# Patient Record
Sex: Male | Born: 1935
Health system: Southern US, Community
[De-identification: ages and names within clinical notes are randomized; demographics above are authoritative.]

## PROBLEM LIST (undated history)

## (undated) DIAGNOSIS — K8591 Acute pancreatitis with uninfected necrosis, unspecified: Secondary | ICD-10-CM

## (undated) DIAGNOSIS — E119 Type 2 diabetes mellitus without complications: Secondary | ICD-10-CM

## (undated) DIAGNOSIS — H9190 Unspecified hearing loss, unspecified ear: Secondary | ICD-10-CM

## (undated) DIAGNOSIS — N189 Chronic kidney disease, unspecified: Secondary | ICD-10-CM

## (undated) DIAGNOSIS — I7090 Unspecified atherosclerosis: Secondary | ICD-10-CM

## (undated) DIAGNOSIS — I110 Hypertensive heart disease with heart failure: Secondary | ICD-10-CM

## (undated) DIAGNOSIS — I5032 Chronic diastolic (congestive) heart failure: Secondary | ICD-10-CM

## (undated) DIAGNOSIS — K861 Other chronic pancreatitis: Secondary | ICD-10-CM

## (undated) DIAGNOSIS — I2510 Atherosclerotic heart disease of native coronary artery without angina pectoris: Secondary | ICD-10-CM

## (undated) DIAGNOSIS — D649 Anemia, unspecified: Secondary | ICD-10-CM

## (undated) DIAGNOSIS — E785 Hyperlipidemia, unspecified: Secondary | ICD-10-CM

## (undated) DIAGNOSIS — I739 Peripheral vascular disease, unspecified: Secondary | ICD-10-CM

## (undated) DIAGNOSIS — K863 Pseudocyst of pancreas: Secondary | ICD-10-CM

## (undated) DIAGNOSIS — H3530 Unspecified macular degeneration: Secondary | ICD-10-CM

## (undated) DIAGNOSIS — K219 Gastro-esophageal reflux disease without esophagitis: Secondary | ICD-10-CM

## (undated) DIAGNOSIS — C61 Malignant neoplasm of prostate: Secondary | ICD-10-CM

## (undated) DIAGNOSIS — I441 Atrioventricular block, second degree: Secondary | ICD-10-CM

## (undated) DIAGNOSIS — I219 Acute myocardial infarction, unspecified: Secondary | ICD-10-CM

## (undated) HISTORY — DX: Other chronic pancreatitis: K86.1

## (undated) HISTORY — DX: Atrioventricular block, second degree: I44.1

## (undated) HISTORY — DX: Hypertensive heart disease with heart failure: I11.0

## (undated) HISTORY — DX: Type 2 diabetes mellitus without complications: E11.9

## (undated) HISTORY — DX: Malignant neoplasm of prostate: C61

## (undated) HISTORY — DX: Anemia, unspecified: D64.9

## (undated) HISTORY — PX: TONSILLECTOMY AND ADENOIDECTOMY: SUR1326

## (undated) HISTORY — DX: Atherosclerotic heart disease of native coronary artery without angina pectoris: I25.10

## (undated) HISTORY — DX: Unspecified macular degeneration: H35.30

## (undated) HISTORY — DX: Hyperlipidemia, unspecified: E78.5

## (undated) HISTORY — PX: TOTAL KNEE REVISION: SHX996

## (undated) HISTORY — DX: Gastro-esophageal reflux disease without esophagitis: K21.9

## (undated) HISTORY — DX: Chronic kidney disease, unspecified: N18.9

## (undated) HISTORY — DX: Acute myocardial infarction, unspecified: I21.9

## (undated) HISTORY — DX: Pseudocyst of pancreas: K86.3

## (undated) HISTORY — DX: Unspecified atherosclerosis: I70.90

## (undated) HISTORY — DX: Unspecified hearing loss, unspecified ear: H91.90

## (undated) HISTORY — DX: Acute pancreatitis with uninfected necrosis, unspecified: K85.91

## (undated) HISTORY — PX: OTHER SURGICAL HISTORY: SHX169

## (undated) HISTORY — PX: CHOLECYSTECTOMY: SHX55

## (undated) HISTORY — DX: Chronic diastolic (congestive) heart failure: I50.32

## (undated) HISTORY — DX: Peripheral vascular disease, unspecified: I73.9

---

## 1999-09-17 ENCOUNTER — Inpatient Hospital Stay (HOSPITAL_COMMUNITY): Admission: RE | Admit: 1999-09-17 | Discharge: 1999-09-19 | Payer: Self-pay | Admitting: Cardiology

## 1999-12-20 ENCOUNTER — Encounter: Payer: Self-pay | Admitting: Emergency Medicine

## 1999-12-20 ENCOUNTER — Inpatient Hospital Stay (HOSPITAL_COMMUNITY): Admission: EM | Admit: 1999-12-20 | Discharge: 1999-12-25 | Payer: Self-pay | Admitting: Emergency Medicine

## 2002-02-18 ENCOUNTER — Encounter: Payer: Self-pay | Admitting: Cardiology

## 2002-02-18 ENCOUNTER — Ambulatory Visit (HOSPITAL_COMMUNITY): Admission: RE | Admit: 2002-02-18 | Discharge: 2002-02-19 | Payer: Self-pay | Admitting: Cardiology

## 2011-08-12 DIAGNOSIS — H353290 Exudative age-related macular degeneration, unspecified eye, stage unspecified: Secondary | ICD-10-CM | POA: Diagnosis not present

## 2011-08-14 DIAGNOSIS — I701 Atherosclerosis of renal artery: Secondary | ICD-10-CM | POA: Diagnosis not present

## 2011-08-14 DIAGNOSIS — I6529 Occlusion and stenosis of unspecified carotid artery: Secondary | ICD-10-CM | POA: Diagnosis not present

## 2011-08-14 DIAGNOSIS — E119 Type 2 diabetes mellitus without complications: Secondary | ICD-10-CM | POA: Diagnosis not present

## 2011-08-14 DIAGNOSIS — I10 Essential (primary) hypertension: Secondary | ICD-10-CM | POA: Diagnosis not present

## 2011-08-14 DIAGNOSIS — I2510 Atherosclerotic heart disease of native coronary artery without angina pectoris: Secondary | ICD-10-CM | POA: Diagnosis not present

## 2011-08-14 DIAGNOSIS — E785 Hyperlipidemia, unspecified: Secondary | ICD-10-CM | POA: Diagnosis not present

## 2011-08-20 DIAGNOSIS — I10 Essential (primary) hypertension: Secondary | ICD-10-CM | POA: Diagnosis not present

## 2011-09-03 DIAGNOSIS — I10 Essential (primary) hypertension: Secondary | ICD-10-CM | POA: Diagnosis not present

## 2011-09-09 DIAGNOSIS — H353290 Exudative age-related macular degeneration, unspecified eye, stage unspecified: Secondary | ICD-10-CM | POA: Diagnosis not present

## 2011-10-13 DIAGNOSIS — K5730 Diverticulosis of large intestine without perforation or abscess without bleeding: Secondary | ICD-10-CM | POA: Diagnosis not present

## 2011-11-04 DIAGNOSIS — H353290 Exudative age-related macular degeneration, unspecified eye, stage unspecified: Secondary | ICD-10-CM | POA: Diagnosis not present

## 2011-11-07 DIAGNOSIS — K219 Gastro-esophageal reflux disease without esophagitis: Secondary | ICD-10-CM | POA: Diagnosis not present

## 2011-11-07 DIAGNOSIS — K208 Other esophagitis without bleeding: Secondary | ICD-10-CM | POA: Diagnosis not present

## 2011-11-07 DIAGNOSIS — E119 Type 2 diabetes mellitus without complications: Secondary | ICD-10-CM | POA: Diagnosis not present

## 2011-11-07 DIAGNOSIS — K449 Diaphragmatic hernia without obstruction or gangrene: Secondary | ICD-10-CM | POA: Diagnosis not present

## 2011-11-07 DIAGNOSIS — Z8719 Personal history of other diseases of the digestive system: Secondary | ICD-10-CM | POA: Diagnosis not present

## 2011-11-07 DIAGNOSIS — K2960 Other gastritis without bleeding: Secondary | ICD-10-CM | POA: Diagnosis not present

## 2011-11-07 DIAGNOSIS — Z1211 Encounter for screening for malignant neoplasm of colon: Secondary | ICD-10-CM | POA: Diagnosis not present

## 2011-11-07 DIAGNOSIS — D126 Benign neoplasm of colon, unspecified: Secondary | ICD-10-CM | POA: Diagnosis not present

## 2011-11-07 DIAGNOSIS — Z86010 Personal history of colonic polyps: Secondary | ICD-10-CM | POA: Diagnosis not present

## 2011-11-07 DIAGNOSIS — E7800 Pure hypercholesterolemia, unspecified: Secondary | ICD-10-CM | POA: Diagnosis not present

## 2011-11-07 DIAGNOSIS — K5730 Diverticulosis of large intestine without perforation or abscess without bleeding: Secondary | ICD-10-CM | POA: Diagnosis not present

## 2011-12-25 DIAGNOSIS — Z79899 Other long term (current) drug therapy: Secondary | ICD-10-CM | POA: Diagnosis not present

## 2011-12-25 DIAGNOSIS — E119 Type 2 diabetes mellitus without complications: Secondary | ICD-10-CM | POA: Diagnosis not present

## 2012-01-06 DIAGNOSIS — H353290 Exudative age-related macular degeneration, unspecified eye, stage unspecified: Secondary | ICD-10-CM | POA: Diagnosis not present

## 2012-01-09 DIAGNOSIS — Z79899 Other long term (current) drug therapy: Secondary | ICD-10-CM | POA: Diagnosis not present

## 2012-01-09 DIAGNOSIS — Z125 Encounter for screening for malignant neoplasm of prostate: Secondary | ICD-10-CM | POA: Diagnosis not present

## 2012-01-14 DIAGNOSIS — M999 Biomechanical lesion, unspecified: Secondary | ICD-10-CM | POA: Diagnosis not present

## 2012-01-14 DIAGNOSIS — M5137 Other intervertebral disc degeneration, lumbosacral region: Secondary | ICD-10-CM | POA: Diagnosis not present

## 2012-01-14 DIAGNOSIS — IMO0002 Reserved for concepts with insufficient information to code with codable children: Secondary | ICD-10-CM | POA: Diagnosis not present

## 2012-01-14 DIAGNOSIS — M62838 Other muscle spasm: Secondary | ICD-10-CM | POA: Diagnosis not present

## 2012-01-15 DIAGNOSIS — M5137 Other intervertebral disc degeneration, lumbosacral region: Secondary | ICD-10-CM | POA: Diagnosis not present

## 2012-01-15 DIAGNOSIS — M999 Biomechanical lesion, unspecified: Secondary | ICD-10-CM | POA: Diagnosis not present

## 2012-01-15 DIAGNOSIS — IMO0002 Reserved for concepts with insufficient information to code with codable children: Secondary | ICD-10-CM | POA: Diagnosis not present

## 2012-01-15 DIAGNOSIS — M62838 Other muscle spasm: Secondary | ICD-10-CM | POA: Diagnosis not present

## 2012-01-16 DIAGNOSIS — M5137 Other intervertebral disc degeneration, lumbosacral region: Secondary | ICD-10-CM | POA: Diagnosis not present

## 2012-01-16 DIAGNOSIS — M999 Biomechanical lesion, unspecified: Secondary | ICD-10-CM | POA: Diagnosis not present

## 2012-01-16 DIAGNOSIS — IMO0002 Reserved for concepts with insufficient information to code with codable children: Secondary | ICD-10-CM | POA: Diagnosis not present

## 2012-01-16 DIAGNOSIS — M62838 Other muscle spasm: Secondary | ICD-10-CM | POA: Diagnosis not present

## 2012-01-19 DIAGNOSIS — IMO0002 Reserved for concepts with insufficient information to code with codable children: Secondary | ICD-10-CM | POA: Diagnosis not present

## 2012-01-19 DIAGNOSIS — M5137 Other intervertebral disc degeneration, lumbosacral region: Secondary | ICD-10-CM | POA: Diagnosis not present

## 2012-01-19 DIAGNOSIS — M999 Biomechanical lesion, unspecified: Secondary | ICD-10-CM | POA: Diagnosis not present

## 2012-01-19 DIAGNOSIS — M62838 Other muscle spasm: Secondary | ICD-10-CM | POA: Diagnosis not present

## 2012-01-28 DIAGNOSIS — M5137 Other intervertebral disc degeneration, lumbosacral region: Secondary | ICD-10-CM | POA: Diagnosis not present

## 2012-01-28 DIAGNOSIS — IMO0002 Reserved for concepts with insufficient information to code with codable children: Secondary | ICD-10-CM | POA: Diagnosis not present

## 2012-01-28 DIAGNOSIS — M999 Biomechanical lesion, unspecified: Secondary | ICD-10-CM | POA: Diagnosis not present

## 2012-01-28 DIAGNOSIS — M62838 Other muscle spasm: Secondary | ICD-10-CM | POA: Diagnosis not present

## 2012-01-30 DIAGNOSIS — M5137 Other intervertebral disc degeneration, lumbosacral region: Secondary | ICD-10-CM | POA: Diagnosis not present

## 2012-01-30 DIAGNOSIS — IMO0002 Reserved for concepts with insufficient information to code with codable children: Secondary | ICD-10-CM | POA: Diagnosis not present

## 2012-01-30 DIAGNOSIS — M999 Biomechanical lesion, unspecified: Secondary | ICD-10-CM | POA: Diagnosis not present

## 2012-01-30 DIAGNOSIS — M62838 Other muscle spasm: Secondary | ICD-10-CM | POA: Diagnosis not present

## 2012-02-02 DIAGNOSIS — IMO0002 Reserved for concepts with insufficient information to code with codable children: Secondary | ICD-10-CM | POA: Diagnosis not present

## 2012-02-02 DIAGNOSIS — M5137 Other intervertebral disc degeneration, lumbosacral region: Secondary | ICD-10-CM | POA: Diagnosis not present

## 2012-02-02 DIAGNOSIS — M999 Biomechanical lesion, unspecified: Secondary | ICD-10-CM | POA: Diagnosis not present

## 2012-02-02 DIAGNOSIS — M62838 Other muscle spasm: Secondary | ICD-10-CM | POA: Diagnosis not present

## 2012-02-03 DIAGNOSIS — IMO0002 Reserved for concepts with insufficient information to code with codable children: Secondary | ICD-10-CM | POA: Diagnosis not present

## 2012-02-03 DIAGNOSIS — M999 Biomechanical lesion, unspecified: Secondary | ICD-10-CM | POA: Diagnosis not present

## 2012-02-03 DIAGNOSIS — M5137 Other intervertebral disc degeneration, lumbosacral region: Secondary | ICD-10-CM | POA: Diagnosis not present

## 2012-02-03 DIAGNOSIS — M62838 Other muscle spasm: Secondary | ICD-10-CM | POA: Diagnosis not present

## 2012-02-05 DIAGNOSIS — IMO0002 Reserved for concepts with insufficient information to code with codable children: Secondary | ICD-10-CM | POA: Diagnosis not present

## 2012-02-05 DIAGNOSIS — M62838 Other muscle spasm: Secondary | ICD-10-CM | POA: Diagnosis not present

## 2012-02-05 DIAGNOSIS — M5137 Other intervertebral disc degeneration, lumbosacral region: Secondary | ICD-10-CM | POA: Diagnosis not present

## 2012-02-05 DIAGNOSIS — M999 Biomechanical lesion, unspecified: Secondary | ICD-10-CM | POA: Diagnosis not present

## 2012-02-09 DIAGNOSIS — M5137 Other intervertebral disc degeneration, lumbosacral region: Secondary | ICD-10-CM | POA: Diagnosis not present

## 2012-02-09 DIAGNOSIS — M62838 Other muscle spasm: Secondary | ICD-10-CM | POA: Diagnosis not present

## 2012-02-09 DIAGNOSIS — IMO0002 Reserved for concepts with insufficient information to code with codable children: Secondary | ICD-10-CM | POA: Diagnosis not present

## 2012-02-09 DIAGNOSIS — M999 Biomechanical lesion, unspecified: Secondary | ICD-10-CM | POA: Diagnosis not present

## 2012-02-10 DIAGNOSIS — M999 Biomechanical lesion, unspecified: Secondary | ICD-10-CM | POA: Diagnosis not present

## 2012-02-10 DIAGNOSIS — IMO0002 Reserved for concepts with insufficient information to code with codable children: Secondary | ICD-10-CM | POA: Diagnosis not present

## 2012-02-10 DIAGNOSIS — M5137 Other intervertebral disc degeneration, lumbosacral region: Secondary | ICD-10-CM | POA: Diagnosis not present

## 2012-02-10 DIAGNOSIS — M62838 Other muscle spasm: Secondary | ICD-10-CM | POA: Diagnosis not present

## 2012-02-12 DIAGNOSIS — M999 Biomechanical lesion, unspecified: Secondary | ICD-10-CM | POA: Diagnosis not present

## 2012-02-12 DIAGNOSIS — M5137 Other intervertebral disc degeneration, lumbosacral region: Secondary | ICD-10-CM | POA: Diagnosis not present

## 2012-02-12 DIAGNOSIS — IMO0002 Reserved for concepts with insufficient information to code with codable children: Secondary | ICD-10-CM | POA: Diagnosis not present

## 2012-02-12 DIAGNOSIS — M62838 Other muscle spasm: Secondary | ICD-10-CM | POA: Diagnosis not present

## 2012-02-16 DIAGNOSIS — IMO0002 Reserved for concepts with insufficient information to code with codable children: Secondary | ICD-10-CM | POA: Diagnosis not present

## 2012-02-16 DIAGNOSIS — M999 Biomechanical lesion, unspecified: Secondary | ICD-10-CM | POA: Diagnosis not present

## 2012-02-16 DIAGNOSIS — M5137 Other intervertebral disc degeneration, lumbosacral region: Secondary | ICD-10-CM | POA: Diagnosis not present

## 2012-02-16 DIAGNOSIS — M62838 Other muscle spasm: Secondary | ICD-10-CM | POA: Diagnosis not present

## 2012-02-17 DIAGNOSIS — M5137 Other intervertebral disc degeneration, lumbosacral region: Secondary | ICD-10-CM | POA: Diagnosis not present

## 2012-02-17 DIAGNOSIS — M999 Biomechanical lesion, unspecified: Secondary | ICD-10-CM | POA: Diagnosis not present

## 2012-02-17 DIAGNOSIS — M62838 Other muscle spasm: Secondary | ICD-10-CM | POA: Diagnosis not present

## 2012-02-17 DIAGNOSIS — IMO0002 Reserved for concepts with insufficient information to code with codable children: Secondary | ICD-10-CM | POA: Diagnosis not present

## 2012-03-09 DIAGNOSIS — H353290 Exudative age-related macular degeneration, unspecified eye, stage unspecified: Secondary | ICD-10-CM | POA: Diagnosis not present

## 2012-03-26 DIAGNOSIS — H3530 Unspecified macular degeneration: Secondary | ICD-10-CM | POA: Diagnosis not present

## 2012-03-26 DIAGNOSIS — H2510 Age-related nuclear cataract, unspecified eye: Secondary | ICD-10-CM | POA: Diagnosis not present

## 2012-03-26 DIAGNOSIS — E119 Type 2 diabetes mellitus without complications: Secondary | ICD-10-CM | POA: Diagnosis not present

## 2012-04-09 DIAGNOSIS — H3530 Unspecified macular degeneration: Secondary | ICD-10-CM | POA: Diagnosis not present

## 2012-05-17 DIAGNOSIS — Z23 Encounter for immunization: Secondary | ICD-10-CM | POA: Diagnosis not present

## 2012-08-23 DIAGNOSIS — E785 Hyperlipidemia, unspecified: Secondary | ICD-10-CM | POA: Diagnosis not present

## 2012-08-23 DIAGNOSIS — E119 Type 2 diabetes mellitus without complications: Secondary | ICD-10-CM | POA: Diagnosis not present

## 2012-08-23 DIAGNOSIS — I2510 Atherosclerotic heart disease of native coronary artery without angina pectoris: Secondary | ICD-10-CM | POA: Diagnosis not present

## 2012-08-23 DIAGNOSIS — I209 Angina pectoris, unspecified: Secondary | ICD-10-CM | POA: Diagnosis not present

## 2012-08-23 DIAGNOSIS — I10 Essential (primary) hypertension: Secondary | ICD-10-CM | POA: Diagnosis not present

## 2012-09-23 DIAGNOSIS — M899 Disorder of bone, unspecified: Secondary | ICD-10-CM | POA: Diagnosis not present

## 2012-11-01 DIAGNOSIS — H3530 Unspecified macular degeneration: Secondary | ICD-10-CM | POA: Diagnosis not present

## 2012-12-28 DIAGNOSIS — H3530 Unspecified macular degeneration: Secondary | ICD-10-CM | POA: Diagnosis not present

## 2012-12-28 DIAGNOSIS — K219 Gastro-esophageal reflux disease without esophagitis: Secondary | ICD-10-CM | POA: Diagnosis not present

## 2012-12-28 DIAGNOSIS — E785 Hyperlipidemia, unspecified: Secondary | ICD-10-CM | POA: Diagnosis not present

## 2012-12-28 DIAGNOSIS — R0602 Shortness of breath: Secondary | ICD-10-CM | POA: Diagnosis not present

## 2012-12-28 DIAGNOSIS — N400 Enlarged prostate without lower urinary tract symptoms: Secondary | ICD-10-CM | POA: Diagnosis not present

## 2012-12-28 DIAGNOSIS — R52 Pain, unspecified: Secondary | ICD-10-CM | POA: Diagnosis not present

## 2012-12-28 DIAGNOSIS — Z794 Long term (current) use of insulin: Secondary | ICD-10-CM | POA: Diagnosis not present

## 2012-12-28 DIAGNOSIS — Z96659 Presence of unspecified artificial knee joint: Secondary | ICD-10-CM | POA: Diagnosis not present

## 2012-12-28 DIAGNOSIS — Z7902 Long term (current) use of antithrombotics/antiplatelets: Secondary | ICD-10-CM | POA: Diagnosis not present

## 2012-12-28 DIAGNOSIS — Z9861 Coronary angioplasty status: Secondary | ICD-10-CM | POA: Diagnosis not present

## 2012-12-28 DIAGNOSIS — Z7982 Long term (current) use of aspirin: Secondary | ICD-10-CM | POA: Diagnosis not present

## 2012-12-28 DIAGNOSIS — I200 Unstable angina: Secondary | ICD-10-CM | POA: Diagnosis not present

## 2012-12-28 DIAGNOSIS — Z9889 Other specified postprocedural states: Secondary | ICD-10-CM | POA: Diagnosis not present

## 2012-12-28 DIAGNOSIS — R079 Chest pain, unspecified: Secondary | ICD-10-CM | POA: Diagnosis not present

## 2012-12-28 DIAGNOSIS — E7800 Pure hypercholesterolemia, unspecified: Secondary | ICD-10-CM | POA: Diagnosis not present

## 2012-12-28 DIAGNOSIS — I10 Essential (primary) hypertension: Secondary | ICD-10-CM | POA: Diagnosis not present

## 2012-12-28 DIAGNOSIS — E119 Type 2 diabetes mellitus without complications: Secondary | ICD-10-CM | POA: Diagnosis not present

## 2012-12-28 DIAGNOSIS — Z87891 Personal history of nicotine dependence: Secondary | ICD-10-CM | POA: Diagnosis not present

## 2012-12-28 DIAGNOSIS — J4489 Other specified chronic obstructive pulmonary disease: Secondary | ICD-10-CM | POA: Diagnosis not present

## 2012-12-28 DIAGNOSIS — R0789 Other chest pain: Secondary | ICD-10-CM | POA: Diagnosis not present

## 2012-12-28 DIAGNOSIS — Z79899 Other long term (current) drug therapy: Secondary | ICD-10-CM | POA: Diagnosis not present

## 2012-12-28 DIAGNOSIS — J449 Chronic obstructive pulmonary disease, unspecified: Secondary | ICD-10-CM | POA: Diagnosis not present

## 2012-12-28 DIAGNOSIS — I2510 Atherosclerotic heart disease of native coronary artery without angina pectoris: Secondary | ICD-10-CM | POA: Diagnosis not present

## 2012-12-29 DIAGNOSIS — R0789 Other chest pain: Secondary | ICD-10-CM | POA: Diagnosis not present

## 2012-12-29 DIAGNOSIS — E1169 Type 2 diabetes mellitus with other specified complication: Secondary | ICD-10-CM | POA: Diagnosis not present

## 2012-12-29 DIAGNOSIS — K219 Gastro-esophageal reflux disease without esophagitis: Secondary | ICD-10-CM | POA: Diagnosis not present

## 2012-12-29 DIAGNOSIS — I2510 Atherosclerotic heart disease of native coronary artery without angina pectoris: Secondary | ICD-10-CM | POA: Diagnosis not present

## 2012-12-29 DIAGNOSIS — R079 Chest pain, unspecified: Secondary | ICD-10-CM | POA: Diagnosis not present

## 2013-01-05 DIAGNOSIS — R1013 Epigastric pain: Secondary | ICD-10-CM | POA: Diagnosis not present

## 2013-01-05 DIAGNOSIS — Z006 Encounter for examination for normal comparison and control in clinical research program: Secondary | ICD-10-CM | POA: Diagnosis not present

## 2013-01-05 DIAGNOSIS — E782 Mixed hyperlipidemia: Secondary | ICD-10-CM | POA: Diagnosis not present

## 2013-01-05 DIAGNOSIS — E119 Type 2 diabetes mellitus without complications: Secondary | ICD-10-CM | POA: Diagnosis not present

## 2013-04-15 DIAGNOSIS — Z23 Encounter for immunization: Secondary | ICD-10-CM | POA: Diagnosis not present

## 2013-05-24 DIAGNOSIS — H353290 Exudative age-related macular degeneration, unspecified eye, stage unspecified: Secondary | ICD-10-CM | POA: Diagnosis not present

## 2013-07-07 DIAGNOSIS — M25579 Pain in unspecified ankle and joints of unspecified foot: Secondary | ICD-10-CM | POA: Diagnosis not present

## 2013-08-18 DIAGNOSIS — I359 Nonrheumatic aortic valve disorder, unspecified: Secondary | ICD-10-CM | POA: Diagnosis not present

## 2013-08-18 DIAGNOSIS — E785 Hyperlipidemia, unspecified: Secondary | ICD-10-CM | POA: Diagnosis not present

## 2013-08-18 DIAGNOSIS — I2510 Atherosclerotic heart disease of native coronary artery without angina pectoris: Secondary | ICD-10-CM | POA: Diagnosis not present

## 2013-08-18 DIAGNOSIS — I10 Essential (primary) hypertension: Secondary | ICD-10-CM | POA: Diagnosis not present

## 2013-11-07 DIAGNOSIS — H3530 Unspecified macular degeneration: Secondary | ICD-10-CM | POA: Diagnosis not present

## 2013-12-20 DIAGNOSIS — H353290 Exudative age-related macular degeneration, unspecified eye, stage unspecified: Secondary | ICD-10-CM | POA: Diagnosis not present

## 2013-12-22 DIAGNOSIS — E119 Type 2 diabetes mellitus without complications: Secondary | ICD-10-CM | POA: Diagnosis not present

## 2013-12-22 DIAGNOSIS — E782 Mixed hyperlipidemia: Secondary | ICD-10-CM | POA: Diagnosis not present

## 2013-12-22 DIAGNOSIS — Z125 Encounter for screening for malignant neoplasm of prostate: Secondary | ICD-10-CM | POA: Diagnosis not present

## 2013-12-28 DIAGNOSIS — N429 Disorder of prostate, unspecified: Secondary | ICD-10-CM | POA: Diagnosis not present

## 2013-12-28 DIAGNOSIS — IMO0001 Reserved for inherently not codable concepts without codable children: Secondary | ICD-10-CM | POA: Diagnosis not present

## 2013-12-28 DIAGNOSIS — Z0000 Encounter for general adult medical examination without abnormal findings: Secondary | ICD-10-CM | POA: Diagnosis not present

## 2014-01-18 DIAGNOSIS — R972 Elevated prostate specific antigen [PSA]: Secondary | ICD-10-CM | POA: Diagnosis not present

## 2014-01-18 DIAGNOSIS — N403 Nodular prostate with lower urinary tract symptoms: Secondary | ICD-10-CM | POA: Diagnosis not present

## 2014-01-18 DIAGNOSIS — N138 Other obstructive and reflux uropathy: Secondary | ICD-10-CM | POA: Diagnosis not present

## 2014-03-03 DIAGNOSIS — IMO0002 Reserved for concepts with insufficient information to code with codable children: Secondary | ICD-10-CM | POA: Diagnosis not present

## 2014-03-03 DIAGNOSIS — C61 Malignant neoplasm of prostate: Secondary | ICD-10-CM | POA: Diagnosis not present

## 2014-03-03 DIAGNOSIS — D075 Carcinoma in situ of prostate: Secondary | ICD-10-CM | POA: Diagnosis not present

## 2014-03-03 DIAGNOSIS — R972 Elevated prostate specific antigen [PSA]: Secondary | ICD-10-CM | POA: Diagnosis not present

## 2014-03-07 ENCOUNTER — Other Ambulatory Visit: Payer: Self-pay | Admitting: Urology

## 2014-03-07 DIAGNOSIS — C61 Malignant neoplasm of prostate: Secondary | ICD-10-CM

## 2014-03-20 ENCOUNTER — Encounter (HOSPITAL_COMMUNITY)
Admission: RE | Admit: 2014-03-20 | Discharge: 2014-03-20 | Disposition: A | Discharge status: Home / Self Care | Payer: Medicare Other | Source: Ambulatory Visit | Attending: Urology | Admitting: Urology

## 2014-03-20 ENCOUNTER — Encounter (HOSPITAL_COMMUNITY): Payer: Medicare Other

## 2014-03-20 DIAGNOSIS — K5730 Diverticulosis of large intestine without perforation or abscess without bleeding: Secondary | ICD-10-CM | POA: Diagnosis not present

## 2014-03-20 DIAGNOSIS — C61 Malignant neoplasm of prostate: Secondary | ICD-10-CM | POA: Insufficient documentation

## 2014-03-20 MED ORDER — TECHNETIUM TC 99M MEDRONATE IV KIT
27.5000 | PACK | Freq: Once | INTRAVENOUS | Status: AC | PRN
  Administered 2014-03-20: 27.5 via INTRAVENOUS

## 2014-04-07 ENCOUNTER — Ambulatory Visit (HOSPITAL_COMMUNITY): Payer: Self-pay

## 2014-04-07 ENCOUNTER — Encounter (HOSPITAL_COMMUNITY): Payer: Self-pay

## 2014-04-11 DIAGNOSIS — Z23 Encounter for immunization: Secondary | ICD-10-CM | POA: Diagnosis not present

## 2014-04-12 DIAGNOSIS — C61 Malignant neoplasm of prostate: Secondary | ICD-10-CM | POA: Diagnosis not present

## 2014-04-12 DIAGNOSIS — N138 Other obstructive and reflux uropathy: Secondary | ICD-10-CM | POA: Diagnosis not present

## 2014-04-12 DIAGNOSIS — N403 Nodular prostate with lower urinary tract symptoms: Secondary | ICD-10-CM | POA: Diagnosis not present

## 2014-04-20 DIAGNOSIS — I2510 Atherosclerotic heart disease of native coronary artery without angina pectoris: Secondary | ICD-10-CM | POA: Diagnosis not present

## 2014-04-20 DIAGNOSIS — Z794 Long term (current) use of insulin: Secondary | ICD-10-CM | POA: Diagnosis not present

## 2014-04-20 DIAGNOSIS — C61 Malignant neoplasm of prostate: Secondary | ICD-10-CM | POA: Diagnosis not present

## 2014-04-20 DIAGNOSIS — Z79899 Other long term (current) drug therapy: Secondary | ICD-10-CM | POA: Diagnosis not present

## 2014-04-20 DIAGNOSIS — K219 Gastro-esophageal reflux disease without esophagitis: Secondary | ICD-10-CM | POA: Diagnosis not present

## 2014-04-20 DIAGNOSIS — E119 Type 2 diabetes mellitus without complications: Secondary | ICD-10-CM | POA: Diagnosis not present

## 2014-04-20 DIAGNOSIS — E785 Hyperlipidemia, unspecified: Secondary | ICD-10-CM | POA: Diagnosis not present

## 2014-05-10 DIAGNOSIS — R1084 Generalized abdominal pain: Secondary | ICD-10-CM | POA: Diagnosis not present

## 2014-05-10 DIAGNOSIS — R1011 Right upper quadrant pain: Secondary | ICD-10-CM | POA: Diagnosis not present

## 2014-05-15 DIAGNOSIS — C61 Malignant neoplasm of prostate: Secondary | ICD-10-CM | POA: Diagnosis not present

## 2014-05-15 DIAGNOSIS — R1011 Right upper quadrant pain: Secondary | ICD-10-CM | POA: Diagnosis not present

## 2014-05-22 DIAGNOSIS — Z510 Encounter for antineoplastic radiation therapy: Secondary | ICD-10-CM | POA: Diagnosis not present

## 2014-05-22 DIAGNOSIS — C61 Malignant neoplasm of prostate: Secondary | ICD-10-CM | POA: Diagnosis not present

## 2014-05-30 DIAGNOSIS — C61 Malignant neoplasm of prostate: Secondary | ICD-10-CM | POA: Diagnosis not present

## 2014-05-30 DIAGNOSIS — Z510 Encounter for antineoplastic radiation therapy: Secondary | ICD-10-CM | POA: Diagnosis not present

## 2014-05-31 DIAGNOSIS — C61 Malignant neoplasm of prostate: Secondary | ICD-10-CM | POA: Diagnosis not present

## 2014-05-31 DIAGNOSIS — Z510 Encounter for antineoplastic radiation therapy: Secondary | ICD-10-CM | POA: Diagnosis not present

## 2014-06-05 DIAGNOSIS — Z510 Encounter for antineoplastic radiation therapy: Secondary | ICD-10-CM | POA: Diagnosis not present

## 2014-06-05 DIAGNOSIS — C61 Malignant neoplasm of prostate: Secondary | ICD-10-CM | POA: Diagnosis not present

## 2014-06-06 DIAGNOSIS — C61 Malignant neoplasm of prostate: Secondary | ICD-10-CM | POA: Diagnosis not present

## 2014-06-06 DIAGNOSIS — Z510 Encounter for antineoplastic radiation therapy: Secondary | ICD-10-CM | POA: Diagnosis not present

## 2014-06-07 DIAGNOSIS — C61 Malignant neoplasm of prostate: Secondary | ICD-10-CM | POA: Diagnosis not present

## 2014-06-07 DIAGNOSIS — Z510 Encounter for antineoplastic radiation therapy: Secondary | ICD-10-CM | POA: Diagnosis not present

## 2014-06-08 DIAGNOSIS — Z510 Encounter for antineoplastic radiation therapy: Secondary | ICD-10-CM | POA: Diagnosis not present

## 2014-06-08 DIAGNOSIS — C61 Malignant neoplasm of prostate: Secondary | ICD-10-CM | POA: Diagnosis not present

## 2014-06-09 DIAGNOSIS — C61 Malignant neoplasm of prostate: Secondary | ICD-10-CM | POA: Diagnosis not present

## 2014-06-09 DIAGNOSIS — Z510 Encounter for antineoplastic radiation therapy: Secondary | ICD-10-CM | POA: Diagnosis not present

## 2014-06-12 DIAGNOSIS — C61 Malignant neoplasm of prostate: Secondary | ICD-10-CM | POA: Diagnosis not present

## 2014-06-12 DIAGNOSIS — Z510 Encounter for antineoplastic radiation therapy: Secondary | ICD-10-CM | POA: Diagnosis not present

## 2014-06-13 DIAGNOSIS — C61 Malignant neoplasm of prostate: Secondary | ICD-10-CM | POA: Diagnosis not present

## 2014-06-13 DIAGNOSIS — Z510 Encounter for antineoplastic radiation therapy: Secondary | ICD-10-CM | POA: Diagnosis not present

## 2014-06-14 DIAGNOSIS — C61 Malignant neoplasm of prostate: Secondary | ICD-10-CM | POA: Diagnosis not present

## 2014-06-14 DIAGNOSIS — Z510 Encounter for antineoplastic radiation therapy: Secondary | ICD-10-CM | POA: Diagnosis not present

## 2014-06-15 DIAGNOSIS — Z510 Encounter for antineoplastic radiation therapy: Secondary | ICD-10-CM | POA: Diagnosis not present

## 2014-06-15 DIAGNOSIS — C61 Malignant neoplasm of prostate: Secondary | ICD-10-CM | POA: Diagnosis not present

## 2014-06-16 DIAGNOSIS — C61 Malignant neoplasm of prostate: Secondary | ICD-10-CM | POA: Diagnosis not present

## 2014-06-16 DIAGNOSIS — Z510 Encounter for antineoplastic radiation therapy: Secondary | ICD-10-CM | POA: Diagnosis not present

## 2014-06-17 DIAGNOSIS — C61 Malignant neoplasm of prostate: Secondary | ICD-10-CM | POA: Diagnosis not present

## 2014-06-17 DIAGNOSIS — Z510 Encounter for antineoplastic radiation therapy: Secondary | ICD-10-CM | POA: Diagnosis not present

## 2014-06-19 DIAGNOSIS — C61 Malignant neoplasm of prostate: Secondary | ICD-10-CM | POA: Diagnosis not present

## 2014-06-19 DIAGNOSIS — Z510 Encounter for antineoplastic radiation therapy: Secondary | ICD-10-CM | POA: Diagnosis not present

## 2014-06-20 DIAGNOSIS — C61 Malignant neoplasm of prostate: Secondary | ICD-10-CM | POA: Diagnosis not present

## 2014-06-20 DIAGNOSIS — Z510 Encounter for antineoplastic radiation therapy: Secondary | ICD-10-CM | POA: Diagnosis not present

## 2014-06-21 DIAGNOSIS — E119 Type 2 diabetes mellitus without complications: Secondary | ICD-10-CM | POA: Diagnosis not present

## 2014-06-21 DIAGNOSIS — C61 Malignant neoplasm of prostate: Secondary | ICD-10-CM | POA: Diagnosis not present

## 2014-06-21 DIAGNOSIS — Z510 Encounter for antineoplastic radiation therapy: Secondary | ICD-10-CM | POA: Diagnosis not present

## 2014-06-26 DIAGNOSIS — Z510 Encounter for antineoplastic radiation therapy: Secondary | ICD-10-CM | POA: Diagnosis not present

## 2014-06-26 DIAGNOSIS — C61 Malignant neoplasm of prostate: Secondary | ICD-10-CM | POA: Diagnosis not present

## 2014-06-27 DIAGNOSIS — Z510 Encounter for antineoplastic radiation therapy: Secondary | ICD-10-CM | POA: Diagnosis not present

## 2014-06-27 DIAGNOSIS — C61 Malignant neoplasm of prostate: Secondary | ICD-10-CM | POA: Diagnosis not present

## 2014-06-28 DIAGNOSIS — C61 Malignant neoplasm of prostate: Secondary | ICD-10-CM | POA: Diagnosis not present

## 2014-06-29 DIAGNOSIS — C61 Malignant neoplasm of prostate: Secondary | ICD-10-CM | POA: Diagnosis not present

## 2014-06-30 DIAGNOSIS — C61 Malignant neoplasm of prostate: Secondary | ICD-10-CM | POA: Diagnosis not present

## 2014-07-03 DIAGNOSIS — C61 Malignant neoplasm of prostate: Secondary | ICD-10-CM | POA: Diagnosis not present

## 2014-07-03 DIAGNOSIS — K861 Other chronic pancreatitis: Secondary | ICD-10-CM | POA: Diagnosis not present

## 2014-07-03 DIAGNOSIS — E1165 Type 2 diabetes mellitus with hyperglycemia: Secondary | ICD-10-CM | POA: Diagnosis not present

## 2014-07-03 DIAGNOSIS — E1149 Type 2 diabetes mellitus with other diabetic neurological complication: Secondary | ICD-10-CM | POA: Diagnosis not present

## 2014-07-04 DIAGNOSIS — C61 Malignant neoplasm of prostate: Secondary | ICD-10-CM | POA: Diagnosis not present

## 2014-07-05 DIAGNOSIS — C61 Malignant neoplasm of prostate: Secondary | ICD-10-CM | POA: Diagnosis not present

## 2014-07-06 DIAGNOSIS — C61 Malignant neoplasm of prostate: Secondary | ICD-10-CM | POA: Diagnosis not present

## 2014-07-07 DIAGNOSIS — C61 Malignant neoplasm of prostate: Secondary | ICD-10-CM | POA: Diagnosis not present

## 2014-07-10 DIAGNOSIS — C61 Malignant neoplasm of prostate: Secondary | ICD-10-CM | POA: Diagnosis not present

## 2014-07-11 DIAGNOSIS — C61 Malignant neoplasm of prostate: Secondary | ICD-10-CM | POA: Diagnosis not present

## 2014-07-12 DIAGNOSIS — C61 Malignant neoplasm of prostate: Secondary | ICD-10-CM | POA: Diagnosis not present

## 2014-07-13 DIAGNOSIS — C61 Malignant neoplasm of prostate: Secondary | ICD-10-CM | POA: Diagnosis not present

## 2014-07-14 DIAGNOSIS — C61 Malignant neoplasm of prostate: Secondary | ICD-10-CM | POA: Diagnosis not present

## 2014-07-18 DIAGNOSIS — C61 Malignant neoplasm of prostate: Secondary | ICD-10-CM | POA: Diagnosis not present

## 2014-07-19 DIAGNOSIS — C61 Malignant neoplasm of prostate: Secondary | ICD-10-CM | POA: Diagnosis not present

## 2014-07-20 DIAGNOSIS — C61 Malignant neoplasm of prostate: Secondary | ICD-10-CM | POA: Diagnosis not present

## 2014-07-24 DIAGNOSIS — C61 Malignant neoplasm of prostate: Secondary | ICD-10-CM | POA: Diagnosis not present

## 2014-07-25 DIAGNOSIS — E785 Hyperlipidemia, unspecified: Secondary | ICD-10-CM | POA: Diagnosis not present

## 2014-07-25 DIAGNOSIS — I10 Essential (primary) hypertension: Secondary | ICD-10-CM | POA: Diagnosis not present

## 2014-07-25 DIAGNOSIS — C61 Malignant neoplasm of prostate: Secondary | ICD-10-CM | POA: Diagnosis not present

## 2014-07-25 DIAGNOSIS — I2510 Atherosclerotic heart disease of native coronary artery without angina pectoris: Secondary | ICD-10-CM | POA: Diagnosis not present

## 2014-07-25 DIAGNOSIS — E119 Type 2 diabetes mellitus without complications: Secondary | ICD-10-CM | POA: Diagnosis not present

## 2014-07-26 DIAGNOSIS — C61 Malignant neoplasm of prostate: Secondary | ICD-10-CM | POA: Diagnosis not present

## 2014-07-27 DIAGNOSIS — C61 Malignant neoplasm of prostate: Secondary | ICD-10-CM | POA: Diagnosis not present

## 2014-07-31 DIAGNOSIS — Z510 Encounter for antineoplastic radiation therapy: Secondary | ICD-10-CM | POA: Diagnosis not present

## 2014-07-31 DIAGNOSIS — C61 Malignant neoplasm of prostate: Secondary | ICD-10-CM | POA: Diagnosis not present

## 2014-08-01 DIAGNOSIS — C61 Malignant neoplasm of prostate: Secondary | ICD-10-CM | POA: Diagnosis not present

## 2014-08-01 DIAGNOSIS — Z510 Encounter for antineoplastic radiation therapy: Secondary | ICD-10-CM | POA: Diagnosis not present

## 2014-08-02 DIAGNOSIS — C61 Malignant neoplasm of prostate: Secondary | ICD-10-CM | POA: Diagnosis not present

## 2014-08-02 DIAGNOSIS — Z510 Encounter for antineoplastic radiation therapy: Secondary | ICD-10-CM | POA: Diagnosis not present

## 2014-08-03 DIAGNOSIS — Z510 Encounter for antineoplastic radiation therapy: Secondary | ICD-10-CM | POA: Diagnosis not present

## 2014-08-03 DIAGNOSIS — C61 Malignant neoplasm of prostate: Secondary | ICD-10-CM | POA: Diagnosis not present

## 2014-08-30 DIAGNOSIS — C61 Malignant neoplasm of prostate: Secondary | ICD-10-CM | POA: Diagnosis not present

## 2014-09-12 DIAGNOSIS — H3530 Unspecified macular degeneration: Secondary | ICD-10-CM | POA: Diagnosis not present

## 2014-09-20 DIAGNOSIS — C61 Malignant neoplasm of prostate: Secondary | ICD-10-CM | POA: Diagnosis not present

## 2014-09-28 DIAGNOSIS — E1149 Type 2 diabetes mellitus with other diabetic neurological complication: Secondary | ICD-10-CM | POA: Diagnosis not present

## 2014-10-02 DIAGNOSIS — E1149 Type 2 diabetes mellitus with other diabetic neurological complication: Secondary | ICD-10-CM | POA: Diagnosis not present

## 2014-10-02 DIAGNOSIS — K861 Other chronic pancreatitis: Secondary | ICD-10-CM | POA: Diagnosis not present

## 2014-10-02 DIAGNOSIS — C61 Malignant neoplasm of prostate: Secondary | ICD-10-CM | POA: Diagnosis not present

## 2014-10-02 DIAGNOSIS — E1165 Type 2 diabetes mellitus with hyperglycemia: Secondary | ICD-10-CM | POA: Diagnosis not present

## 2014-10-24 DIAGNOSIS — E291 Testicular hypofunction: Secondary | ICD-10-CM | POA: Diagnosis not present

## 2014-11-01 DIAGNOSIS — C61 Malignant neoplasm of prostate: Secondary | ICD-10-CM | POA: Diagnosis not present

## 2014-12-27 DIAGNOSIS — E119 Type 2 diabetes mellitus without complications: Secondary | ICD-10-CM | POA: Diagnosis not present

## 2015-01-01 DIAGNOSIS — E1165 Type 2 diabetes mellitus with hyperglycemia: Secondary | ICD-10-CM | POA: Diagnosis not present

## 2015-01-01 DIAGNOSIS — Z0000 Encounter for general adult medical examination without abnormal findings: Secondary | ICD-10-CM | POA: Diagnosis not present

## 2015-01-01 DIAGNOSIS — E1149 Type 2 diabetes mellitus with other diabetic neurological complication: Secondary | ICD-10-CM | POA: Diagnosis not present

## 2015-01-02 DIAGNOSIS — E780 Pure hypercholesterolemia: Secondary | ICD-10-CM | POA: Diagnosis not present

## 2015-01-02 DIAGNOSIS — E119 Type 2 diabetes mellitus without complications: Secondary | ICD-10-CM | POA: Diagnosis not present

## 2015-01-02 DIAGNOSIS — M545 Low back pain: Secondary | ICD-10-CM | POA: Diagnosis not present

## 2015-01-02 DIAGNOSIS — I10 Essential (primary) hypertension: Secondary | ICD-10-CM | POA: Diagnosis not present

## 2015-02-01 DIAGNOSIS — I2510 Atherosclerotic heart disease of native coronary artery without angina pectoris: Secondary | ICD-10-CM | POA: Diagnosis not present

## 2015-02-01 DIAGNOSIS — E119 Type 2 diabetes mellitus without complications: Secondary | ICD-10-CM | POA: Diagnosis not present

## 2015-02-01 DIAGNOSIS — M545 Low back pain: Secondary | ICD-10-CM | POA: Diagnosis not present

## 2015-02-01 DIAGNOSIS — E780 Pure hypercholesterolemia: Secondary | ICD-10-CM | POA: Diagnosis not present

## 2015-02-14 DIAGNOSIS — C61 Malignant neoplasm of prostate: Secondary | ICD-10-CM | POA: Diagnosis not present

## 2015-02-14 DIAGNOSIS — Z125 Encounter for screening for malignant neoplasm of prostate: Secondary | ICD-10-CM | POA: Diagnosis not present

## 2015-02-23 DIAGNOSIS — C61 Malignant neoplasm of prostate: Secondary | ICD-10-CM | POA: Diagnosis not present

## 2015-03-04 DIAGNOSIS — E119 Type 2 diabetes mellitus without complications: Secondary | ICD-10-CM | POA: Diagnosis not present

## 2015-03-04 DIAGNOSIS — M5440 Lumbago with sciatica, unspecified side: Secondary | ICD-10-CM | POA: Diagnosis not present

## 2015-03-04 DIAGNOSIS — E782 Mixed hyperlipidemia: Secondary | ICD-10-CM | POA: Diagnosis not present

## 2015-03-04 DIAGNOSIS — I2510 Atherosclerotic heart disease of native coronary artery without angina pectoris: Secondary | ICD-10-CM | POA: Diagnosis not present

## 2015-03-15 DIAGNOSIS — H3530 Unspecified macular degeneration: Secondary | ICD-10-CM | POA: Diagnosis not present

## 2015-04-04 DIAGNOSIS — R972 Elevated prostate specific antigen [PSA]: Secondary | ICD-10-CM | POA: Diagnosis not present

## 2015-04-04 DIAGNOSIS — Z79899 Other long term (current) drug therapy: Secondary | ICD-10-CM | POA: Diagnosis not present

## 2015-04-04 DIAGNOSIS — E119 Type 2 diabetes mellitus without complications: Secondary | ICD-10-CM | POA: Diagnosis not present

## 2015-04-04 DIAGNOSIS — E782 Mixed hyperlipidemia: Secondary | ICD-10-CM | POA: Diagnosis not present

## 2015-04-11 DIAGNOSIS — Z23 Encounter for immunization: Secondary | ICD-10-CM | POA: Diagnosis not present

## 2015-04-11 DIAGNOSIS — E119 Type 2 diabetes mellitus without complications: Secondary | ICD-10-CM | POA: Diagnosis not present

## 2015-04-11 DIAGNOSIS — K868 Other specified diseases of pancreas: Secondary | ICD-10-CM | POA: Diagnosis not present

## 2015-04-16 DIAGNOSIS — H3531 Nonexudative age-related macular degeneration: Secondary | ICD-10-CM | POA: Diagnosis not present

## 2015-04-16 DIAGNOSIS — H35033 Hypertensive retinopathy, bilateral: Secondary | ICD-10-CM | POA: Diagnosis not present

## 2015-04-23 DIAGNOSIS — H2512 Age-related nuclear cataract, left eye: Secondary | ICD-10-CM | POA: Diagnosis not present

## 2015-04-23 DIAGNOSIS — H3532 Exudative age-related macular degeneration: Secondary | ICD-10-CM | POA: Diagnosis not present

## 2015-04-23 DIAGNOSIS — H3531 Nonexudative age-related macular degeneration: Secondary | ICD-10-CM | POA: Diagnosis not present

## 2015-04-25 DIAGNOSIS — R531 Weakness: Secondary | ICD-10-CM | POA: Diagnosis not present

## 2015-04-25 DIAGNOSIS — R509 Fever, unspecified: Secondary | ICD-10-CM | POA: Diagnosis not present

## 2015-04-25 DIAGNOSIS — K219 Gastro-esophageal reflux disease without esophagitis: Secondary | ICD-10-CM | POA: Diagnosis present

## 2015-04-25 DIAGNOSIS — Z79899 Other long term (current) drug therapy: Secondary | ICD-10-CM | POA: Diagnosis not present

## 2015-04-25 DIAGNOSIS — I5033 Acute on chronic diastolic (congestive) heart failure: Secondary | ICD-10-CM | POA: Diagnosis present

## 2015-04-25 DIAGNOSIS — K863 Pseudocyst of pancreas: Secondary | ICD-10-CM | POA: Diagnosis present

## 2015-04-25 DIAGNOSIS — E782 Mixed hyperlipidemia: Secondary | ICD-10-CM | POA: Diagnosis present

## 2015-04-25 DIAGNOSIS — I110 Hypertensive heart disease with heart failure: Secondary | ICD-10-CM | POA: Diagnosis not present

## 2015-04-25 DIAGNOSIS — J9811 Atelectasis: Secondary | ICD-10-CM | POA: Diagnosis not present

## 2015-04-25 DIAGNOSIS — R109 Unspecified abdominal pain: Secondary | ICD-10-CM | POA: Diagnosis not present

## 2015-04-25 DIAGNOSIS — E139 Other specified diabetes mellitus without complications: Secondary | ICD-10-CM | POA: Diagnosis not present

## 2015-04-25 DIAGNOSIS — R748 Abnormal levels of other serum enzymes: Secondary | ICD-10-CM | POA: Diagnosis present

## 2015-04-25 DIAGNOSIS — K8531 Drug induced acute pancreatitis with uninfected necrosis: Secondary | ICD-10-CM | POA: Diagnosis not present

## 2015-04-25 DIAGNOSIS — J449 Chronic obstructive pulmonary disease, unspecified: Secondary | ICD-10-CM | POA: Diagnosis present

## 2015-04-25 DIAGNOSIS — D638 Anemia in other chronic diseases classified elsewhere: Secondary | ICD-10-CM | POA: Diagnosis not present

## 2015-04-25 DIAGNOSIS — M1990 Unspecified osteoarthritis, unspecified site: Secondary | ICD-10-CM | POA: Diagnosis present

## 2015-04-25 DIAGNOSIS — I38 Endocarditis, valve unspecified: Secondary | ICD-10-CM | POA: Diagnosis not present

## 2015-04-25 DIAGNOSIS — Z8546 Personal history of malignant neoplasm of prostate: Secondary | ICD-10-CM | POA: Diagnosis not present

## 2015-04-25 DIAGNOSIS — K859 Acute pancreatitis without necrosis or infection, unspecified: Secondary | ICD-10-CM | POA: Diagnosis not present

## 2015-04-25 DIAGNOSIS — I509 Heart failure, unspecified: Secondary | ICD-10-CM | POA: Diagnosis not present

## 2015-04-25 DIAGNOSIS — Z4682 Encounter for fitting and adjustment of non-vascular catheter: Secondary | ICD-10-CM | POA: Diagnosis not present

## 2015-04-25 DIAGNOSIS — Z794 Long term (current) use of insulin: Secondary | ICD-10-CM | POA: Diagnosis not present

## 2015-04-25 DIAGNOSIS — R0989 Other specified symptoms and signs involving the circulatory and respiratory systems: Secondary | ICD-10-CM | POA: Diagnosis not present

## 2015-04-25 DIAGNOSIS — K567 Ileus, unspecified: Secondary | ICD-10-CM | POA: Diagnosis not present

## 2015-04-25 DIAGNOSIS — I517 Cardiomegaly: Secondary | ICD-10-CM | POA: Diagnosis not present

## 2015-04-25 DIAGNOSIS — D649 Anemia, unspecified: Secondary | ICD-10-CM | POA: Diagnosis not present

## 2015-04-25 DIAGNOSIS — E7800 Pure hypercholesterolemia, unspecified: Secondary | ICD-10-CM | POA: Diagnosis present

## 2015-04-25 DIAGNOSIS — Z9861 Coronary angioplasty status: Secondary | ICD-10-CM | POA: Diagnosis not present

## 2015-04-25 DIAGNOSIS — I340 Nonrheumatic mitral (valve) insufficiency: Secondary | ICD-10-CM | POA: Diagnosis not present

## 2015-04-25 DIAGNOSIS — K8591 Acute pancreatitis with uninfected necrosis, unspecified: Secondary | ICD-10-CM | POA: Diagnosis not present

## 2015-04-25 DIAGNOSIS — I060 Rheumatic aortic stenosis: Secondary | ICD-10-CM | POA: Diagnosis present

## 2015-04-25 DIAGNOSIS — R9431 Abnormal electrocardiogram [ECG] [EKG]: Secondary | ICD-10-CM | POA: Diagnosis not present

## 2015-04-25 DIAGNOSIS — K2980 Duodenitis without bleeding: Secondary | ICD-10-CM | POA: Diagnosis present

## 2015-04-25 DIAGNOSIS — Z87891 Personal history of nicotine dependence: Secondary | ICD-10-CM | POA: Diagnosis not present

## 2015-04-25 DIAGNOSIS — R140 Abdominal distension (gaseous): Secondary | ICD-10-CM | POA: Diagnosis not present

## 2015-04-25 DIAGNOSIS — J690 Pneumonitis due to inhalation of food and vomit: Secondary | ICD-10-CM | POA: Diagnosis not present

## 2015-04-25 DIAGNOSIS — J80 Acute respiratory distress syndrome: Secondary | ICD-10-CM | POA: Diagnosis not present

## 2015-04-25 DIAGNOSIS — R1013 Epigastric pain: Secondary | ICD-10-CM | POA: Diagnosis not present

## 2015-04-25 DIAGNOSIS — I252 Old myocardial infarction: Secondary | ICD-10-CM | POA: Diagnosis not present

## 2015-04-25 DIAGNOSIS — I2510 Atherosclerotic heart disease of native coronary artery without angina pectoris: Secondary | ICD-10-CM | POA: Diagnosis not present

## 2015-05-16 DIAGNOSIS — K2980 Duodenitis without bleeding: Secondary | ICD-10-CM | POA: Diagnosis not present

## 2015-05-16 DIAGNOSIS — Z87891 Personal history of nicotine dependence: Secondary | ICD-10-CM | POA: Diagnosis not present

## 2015-05-16 DIAGNOSIS — Z794 Long term (current) use of insulin: Secondary | ICD-10-CM | POA: Diagnosis not present

## 2015-05-16 DIAGNOSIS — E139 Other specified diabetes mellitus without complications: Secondary | ICD-10-CM | POA: Diagnosis not present

## 2015-05-16 DIAGNOSIS — I5030 Unspecified diastolic (congestive) heart failure: Secondary | ICD-10-CM | POA: Diagnosis not present

## 2015-05-16 DIAGNOSIS — K863 Pseudocyst of pancreas: Secondary | ICD-10-CM | POA: Diagnosis not present

## 2015-05-16 DIAGNOSIS — I110 Hypertensive heart disease with heart failure: Secondary | ICD-10-CM | POA: Diagnosis not present

## 2015-05-16 DIAGNOSIS — D638 Anemia in other chronic diseases classified elsewhere: Secondary | ICD-10-CM | POA: Diagnosis not present

## 2015-05-16 DIAGNOSIS — J449 Chronic obstructive pulmonary disease, unspecified: Secondary | ICD-10-CM | POA: Diagnosis not present

## 2015-05-16 DIAGNOSIS — I350 Nonrheumatic aortic (valve) stenosis: Secondary | ICD-10-CM | POA: Diagnosis not present

## 2015-05-16 DIAGNOSIS — Z792 Long term (current) use of antibiotics: Secondary | ICD-10-CM | POA: Diagnosis not present

## 2015-05-17 DIAGNOSIS — E139 Other specified diabetes mellitus without complications: Secondary | ICD-10-CM | POA: Diagnosis not present

## 2015-05-17 DIAGNOSIS — K863 Pseudocyst of pancreas: Secondary | ICD-10-CM | POA: Diagnosis not present

## 2015-05-17 DIAGNOSIS — I110 Hypertensive heart disease with heart failure: Secondary | ICD-10-CM | POA: Diagnosis not present

## 2015-05-17 DIAGNOSIS — I350 Nonrheumatic aortic (valve) stenosis: Secondary | ICD-10-CM | POA: Diagnosis not present

## 2015-05-17 DIAGNOSIS — K2980 Duodenitis without bleeding: Secondary | ICD-10-CM | POA: Diagnosis not present

## 2015-05-17 DIAGNOSIS — I5030 Unspecified diastolic (congestive) heart failure: Secondary | ICD-10-CM | POA: Diagnosis not present

## 2015-05-21 DIAGNOSIS — K859 Acute pancreatitis without necrosis or infection, unspecified: Secondary | ICD-10-CM | POA: Diagnosis not present

## 2015-05-21 DIAGNOSIS — K863 Pseudocyst of pancreas: Secondary | ICD-10-CM | POA: Diagnosis not present

## 2015-05-21 DIAGNOSIS — I5030 Unspecified diastolic (congestive) heart failure: Secondary | ICD-10-CM | POA: Diagnosis not present

## 2015-05-21 DIAGNOSIS — E139 Other specified diabetes mellitus without complications: Secondary | ICD-10-CM | POA: Diagnosis not present

## 2015-05-21 DIAGNOSIS — I110 Hypertensive heart disease with heart failure: Secondary | ICD-10-CM | POA: Diagnosis not present

## 2015-05-21 DIAGNOSIS — I350 Nonrheumatic aortic (valve) stenosis: Secondary | ICD-10-CM | POA: Diagnosis not present

## 2015-05-21 DIAGNOSIS — K2980 Duodenitis without bleeding: Secondary | ICD-10-CM | POA: Diagnosis not present

## 2015-05-22 DIAGNOSIS — I5030 Unspecified diastolic (congestive) heart failure: Secondary | ICD-10-CM | POA: Diagnosis not present

## 2015-05-22 DIAGNOSIS — K863 Pseudocyst of pancreas: Secondary | ICD-10-CM | POA: Diagnosis not present

## 2015-05-22 DIAGNOSIS — E139 Other specified diabetes mellitus without complications: Secondary | ICD-10-CM | POA: Diagnosis not present

## 2015-05-22 DIAGNOSIS — I350 Nonrheumatic aortic (valve) stenosis: Secondary | ICD-10-CM | POA: Diagnosis not present

## 2015-05-22 DIAGNOSIS — K2980 Duodenitis without bleeding: Secondary | ICD-10-CM | POA: Diagnosis not present

## 2015-05-22 DIAGNOSIS — I110 Hypertensive heart disease with heart failure: Secondary | ICD-10-CM | POA: Diagnosis not present

## 2015-05-23 DIAGNOSIS — I350 Nonrheumatic aortic (valve) stenosis: Secondary | ICD-10-CM | POA: Diagnosis not present

## 2015-05-23 DIAGNOSIS — I110 Hypertensive heart disease with heart failure: Secondary | ICD-10-CM | POA: Diagnosis not present

## 2015-05-23 DIAGNOSIS — K863 Pseudocyst of pancreas: Secondary | ICD-10-CM | POA: Diagnosis not present

## 2015-05-23 DIAGNOSIS — K8591 Acute pancreatitis with uninfected necrosis, unspecified: Secondary | ICD-10-CM | POA: Diagnosis not present

## 2015-05-23 DIAGNOSIS — I5030 Unspecified diastolic (congestive) heart failure: Secondary | ICD-10-CM | POA: Diagnosis not present

## 2015-05-23 DIAGNOSIS — K2980 Duodenitis without bleeding: Secondary | ICD-10-CM | POA: Diagnosis not present

## 2015-05-23 DIAGNOSIS — D649 Anemia, unspecified: Secondary | ICD-10-CM | POA: Diagnosis not present

## 2015-05-23 DIAGNOSIS — E139 Other specified diabetes mellitus without complications: Secondary | ICD-10-CM | POA: Diagnosis not present

## 2015-05-24 DIAGNOSIS — I350 Nonrheumatic aortic (valve) stenosis: Secondary | ICD-10-CM | POA: Diagnosis not present

## 2015-05-24 DIAGNOSIS — I5030 Unspecified diastolic (congestive) heart failure: Secondary | ICD-10-CM | POA: Diagnosis not present

## 2015-05-24 DIAGNOSIS — E139 Other specified diabetes mellitus without complications: Secondary | ICD-10-CM | POA: Diagnosis not present

## 2015-05-24 DIAGNOSIS — K2980 Duodenitis without bleeding: Secondary | ICD-10-CM | POA: Diagnosis not present

## 2015-05-24 DIAGNOSIS — I110 Hypertensive heart disease with heart failure: Secondary | ICD-10-CM | POA: Diagnosis not present

## 2015-05-24 DIAGNOSIS — K863 Pseudocyst of pancreas: Secondary | ICD-10-CM | POA: Diagnosis not present

## 2015-05-25 DIAGNOSIS — I110 Hypertensive heart disease with heart failure: Secondary | ICD-10-CM | POA: Diagnosis not present

## 2015-05-25 DIAGNOSIS — I350 Nonrheumatic aortic (valve) stenosis: Secondary | ICD-10-CM | POA: Diagnosis not present

## 2015-05-25 DIAGNOSIS — E139 Other specified diabetes mellitus without complications: Secondary | ICD-10-CM | POA: Diagnosis not present

## 2015-05-25 DIAGNOSIS — K2980 Duodenitis without bleeding: Secondary | ICD-10-CM | POA: Diagnosis not present

## 2015-05-25 DIAGNOSIS — I5030 Unspecified diastolic (congestive) heart failure: Secondary | ICD-10-CM | POA: Diagnosis not present

## 2015-05-25 DIAGNOSIS — K863 Pseudocyst of pancreas: Secondary | ICD-10-CM | POA: Diagnosis not present

## 2015-05-28 DIAGNOSIS — K2980 Duodenitis without bleeding: Secondary | ICD-10-CM | POA: Diagnosis not present

## 2015-05-28 DIAGNOSIS — E139 Other specified diabetes mellitus without complications: Secondary | ICD-10-CM | POA: Diagnosis not present

## 2015-05-28 DIAGNOSIS — K863 Pseudocyst of pancreas: Secondary | ICD-10-CM | POA: Diagnosis not present

## 2015-05-28 DIAGNOSIS — I350 Nonrheumatic aortic (valve) stenosis: Secondary | ICD-10-CM | POA: Diagnosis not present

## 2015-05-28 DIAGNOSIS — I110 Hypertensive heart disease with heart failure: Secondary | ICD-10-CM | POA: Diagnosis not present

## 2015-05-28 DIAGNOSIS — I5030 Unspecified diastolic (congestive) heart failure: Secondary | ICD-10-CM | POA: Diagnosis not present

## 2015-05-29 DIAGNOSIS — E139 Other specified diabetes mellitus without complications: Secondary | ICD-10-CM | POA: Diagnosis not present

## 2015-05-29 DIAGNOSIS — K2980 Duodenitis without bleeding: Secondary | ICD-10-CM | POA: Diagnosis not present

## 2015-05-29 DIAGNOSIS — I350 Nonrheumatic aortic (valve) stenosis: Secondary | ICD-10-CM | POA: Diagnosis not present

## 2015-05-29 DIAGNOSIS — I110 Hypertensive heart disease with heart failure: Secondary | ICD-10-CM | POA: Diagnosis not present

## 2015-05-29 DIAGNOSIS — K859 Acute pancreatitis without necrosis or infection, unspecified: Secondary | ICD-10-CM | POA: Diagnosis not present

## 2015-05-29 DIAGNOSIS — K863 Pseudocyst of pancreas: Secondary | ICD-10-CM | POA: Diagnosis not present

## 2015-05-29 DIAGNOSIS — I5030 Unspecified diastolic (congestive) heart failure: Secondary | ICD-10-CM | POA: Diagnosis not present

## 2015-05-30 DIAGNOSIS — E139 Other specified diabetes mellitus without complications: Secondary | ICD-10-CM | POA: Diagnosis not present

## 2015-05-30 DIAGNOSIS — I5030 Unspecified diastolic (congestive) heart failure: Secondary | ICD-10-CM | POA: Diagnosis not present

## 2015-05-30 DIAGNOSIS — I350 Nonrheumatic aortic (valve) stenosis: Secondary | ICD-10-CM | POA: Diagnosis not present

## 2015-05-30 DIAGNOSIS — K863 Pseudocyst of pancreas: Secondary | ICD-10-CM | POA: Diagnosis not present

## 2015-05-30 DIAGNOSIS — I110 Hypertensive heart disease with heart failure: Secondary | ICD-10-CM | POA: Diagnosis not present

## 2015-05-30 DIAGNOSIS — K2980 Duodenitis without bleeding: Secondary | ICD-10-CM | POA: Diagnosis not present

## 2015-05-31 DIAGNOSIS — E139 Other specified diabetes mellitus without complications: Secondary | ICD-10-CM | POA: Diagnosis not present

## 2015-05-31 DIAGNOSIS — I110 Hypertensive heart disease with heart failure: Secondary | ICD-10-CM | POA: Diagnosis not present

## 2015-05-31 DIAGNOSIS — I5030 Unspecified diastolic (congestive) heart failure: Secondary | ICD-10-CM | POA: Diagnosis not present

## 2015-05-31 DIAGNOSIS — K2980 Duodenitis without bleeding: Secondary | ICD-10-CM | POA: Diagnosis not present

## 2015-05-31 DIAGNOSIS — I350 Nonrheumatic aortic (valve) stenosis: Secondary | ICD-10-CM | POA: Diagnosis not present

## 2015-05-31 DIAGNOSIS — K863 Pseudocyst of pancreas: Secondary | ICD-10-CM | POA: Diagnosis not present

## 2015-06-01 DIAGNOSIS — K863 Pseudocyst of pancreas: Secondary | ICD-10-CM | POA: Diagnosis not present

## 2015-06-01 DIAGNOSIS — E139 Other specified diabetes mellitus without complications: Secondary | ICD-10-CM | POA: Diagnosis not present

## 2015-06-01 DIAGNOSIS — I350 Nonrheumatic aortic (valve) stenosis: Secondary | ICD-10-CM | POA: Diagnosis not present

## 2015-06-01 DIAGNOSIS — I5030 Unspecified diastolic (congestive) heart failure: Secondary | ICD-10-CM | POA: Diagnosis not present

## 2015-06-01 DIAGNOSIS — I110 Hypertensive heart disease with heart failure: Secondary | ICD-10-CM | POA: Diagnosis not present

## 2015-06-01 DIAGNOSIS — K2980 Duodenitis without bleeding: Secondary | ICD-10-CM | POA: Diagnosis not present

## 2015-06-06 DIAGNOSIS — I110 Hypertensive heart disease with heart failure: Secondary | ICD-10-CM | POA: Diagnosis not present

## 2015-06-06 DIAGNOSIS — E139 Other specified diabetes mellitus without complications: Secondary | ICD-10-CM | POA: Diagnosis not present

## 2015-06-06 DIAGNOSIS — K863 Pseudocyst of pancreas: Secondary | ICD-10-CM | POA: Diagnosis not present

## 2015-06-06 DIAGNOSIS — I350 Nonrheumatic aortic (valve) stenosis: Secondary | ICD-10-CM | POA: Diagnosis not present

## 2015-06-06 DIAGNOSIS — I5030 Unspecified diastolic (congestive) heart failure: Secondary | ICD-10-CM | POA: Diagnosis not present

## 2015-06-06 DIAGNOSIS — K2980 Duodenitis without bleeding: Secondary | ICD-10-CM | POA: Diagnosis not present

## 2015-06-08 DIAGNOSIS — K863 Pseudocyst of pancreas: Secondary | ICD-10-CM | POA: Diagnosis not present

## 2015-06-08 DIAGNOSIS — I5030 Unspecified diastolic (congestive) heart failure: Secondary | ICD-10-CM | POA: Diagnosis not present

## 2015-06-08 DIAGNOSIS — I350 Nonrheumatic aortic (valve) stenosis: Secondary | ICD-10-CM | POA: Diagnosis not present

## 2015-06-08 DIAGNOSIS — K2980 Duodenitis without bleeding: Secondary | ICD-10-CM | POA: Diagnosis not present

## 2015-06-08 DIAGNOSIS — I110 Hypertensive heart disease with heart failure: Secondary | ICD-10-CM | POA: Diagnosis not present

## 2015-06-08 DIAGNOSIS — E139 Other specified diabetes mellitus without complications: Secondary | ICD-10-CM | POA: Diagnosis not present

## 2015-06-11 DIAGNOSIS — K859 Acute pancreatitis without necrosis or infection, unspecified: Secondary | ICD-10-CM | POA: Diagnosis not present

## 2015-06-11 DIAGNOSIS — K5730 Diverticulosis of large intestine without perforation or abscess without bleeding: Secondary | ICD-10-CM | POA: Diagnosis not present

## 2015-06-18 DIAGNOSIS — K8689 Other specified diseases of pancreas: Secondary | ICD-10-CM | POA: Diagnosis not present

## 2015-06-18 DIAGNOSIS — D649 Anemia, unspecified: Secondary | ICD-10-CM | POA: Diagnosis not present

## 2015-06-18 DIAGNOSIS — Z79899 Other long term (current) drug therapy: Secondary | ICD-10-CM | POA: Diagnosis not present

## 2015-06-18 DIAGNOSIS — D539 Nutritional anemia, unspecified: Secondary | ICD-10-CM | POA: Diagnosis not present

## 2015-06-26 DIAGNOSIS — Z9049 Acquired absence of other specified parts of digestive tract: Secondary | ICD-10-CM | POA: Diagnosis not present

## 2015-06-26 DIAGNOSIS — K863 Pseudocyst of pancreas: Secondary | ICD-10-CM | POA: Diagnosis not present

## 2015-06-26 DIAGNOSIS — R6881 Early satiety: Secondary | ICD-10-CM | POA: Diagnosis not present

## 2015-06-26 DIAGNOSIS — K8591 Acute pancreatitis with uninfected necrosis, unspecified: Secondary | ICD-10-CM | POA: Diagnosis not present

## 2015-06-26 DIAGNOSIS — K5730 Diverticulosis of large intestine without perforation or abscess without bleeding: Secondary | ICD-10-CM | POA: Diagnosis not present

## 2015-06-26 DIAGNOSIS — K8689 Other specified diseases of pancreas: Secondary | ICD-10-CM | POA: Diagnosis not present

## 2015-06-26 DIAGNOSIS — K449 Diaphragmatic hernia without obstruction or gangrene: Secondary | ICD-10-CM | POA: Diagnosis not present

## 2015-06-26 DIAGNOSIS — K862 Cyst of pancreas: Secondary | ICD-10-CM | POA: Diagnosis not present

## 2015-06-26 HISTORY — DX: Acute pancreatitis with uninfected necrosis, unspecified: K85.91

## 2015-06-26 HISTORY — DX: Pseudocyst of pancreas: K86.3

## 2015-07-05 DIAGNOSIS — I252 Old myocardial infarction: Secondary | ICD-10-CM | POA: Diagnosis not present

## 2015-07-05 DIAGNOSIS — E119 Type 2 diabetes mellitus without complications: Secondary | ICD-10-CM | POA: Diagnosis not present

## 2015-07-05 DIAGNOSIS — K862 Cyst of pancreas: Secondary | ICD-10-CM | POA: Diagnosis not present

## 2015-07-05 DIAGNOSIS — I10 Essential (primary) hypertension: Secondary | ICD-10-CM | POA: Diagnosis not present

## 2015-07-05 DIAGNOSIS — Z8546 Personal history of malignant neoplasm of prostate: Secondary | ICD-10-CM | POA: Diagnosis not present

## 2015-07-05 DIAGNOSIS — R6881 Early satiety: Secondary | ICD-10-CM | POA: Diagnosis not present

## 2015-07-05 DIAGNOSIS — K863 Pseudocyst of pancreas: Secondary | ICD-10-CM | POA: Diagnosis not present

## 2015-07-05 DIAGNOSIS — K219 Gastro-esophageal reflux disease without esophagitis: Secondary | ICD-10-CM | POA: Diagnosis not present

## 2015-07-05 DIAGNOSIS — K8592 Acute pancreatitis with infected necrosis, unspecified: Secondary | ICD-10-CM | POA: Diagnosis not present

## 2015-07-05 DIAGNOSIS — I2510 Atherosclerotic heart disease of native coronary artery without angina pectoris: Secondary | ICD-10-CM | POA: Diagnosis not present

## 2015-07-05 DIAGNOSIS — D649 Anemia, unspecified: Secondary | ICD-10-CM | POA: Diagnosis not present

## 2015-07-05 DIAGNOSIS — K8591 Acute pancreatitis with uninfected necrosis, unspecified: Secondary | ICD-10-CM | POA: Diagnosis not present

## 2015-07-05 DIAGNOSIS — Z794 Long term (current) use of insulin: Secondary | ICD-10-CM | POA: Diagnosis not present

## 2015-07-05 DIAGNOSIS — J449 Chronic obstructive pulmonary disease, unspecified: Secondary | ICD-10-CM | POA: Diagnosis not present

## 2015-07-05 DIAGNOSIS — Q394 Esophageal web: Secondary | ICD-10-CM | POA: Diagnosis not present

## 2015-07-05 DIAGNOSIS — Z9049 Acquired absence of other specified parts of digestive tract: Secondary | ICD-10-CM | POA: Diagnosis not present

## 2015-07-05 DIAGNOSIS — K8689 Other specified diseases of pancreas: Secondary | ICD-10-CM | POA: Diagnosis not present

## 2015-07-05 DIAGNOSIS — Z86010 Personal history of colonic polyps: Secondary | ICD-10-CM | POA: Diagnosis not present

## 2015-07-05 DIAGNOSIS — I509 Heart failure, unspecified: Secondary | ICD-10-CM | POA: Diagnosis not present

## 2015-07-05 DIAGNOSIS — R634 Abnormal weight loss: Secondary | ICD-10-CM | POA: Diagnosis not present

## 2015-07-05 DIAGNOSIS — Z87891 Personal history of nicotine dependence: Secondary | ICD-10-CM | POA: Diagnosis not present

## 2015-07-05 DIAGNOSIS — R011 Cardiac murmur, unspecified: Secondary | ICD-10-CM | POA: Diagnosis not present

## 2015-07-05 DIAGNOSIS — R110 Nausea: Secondary | ICD-10-CM | POA: Diagnosis not present

## 2015-07-05 DIAGNOSIS — K861 Other chronic pancreatitis: Secondary | ICD-10-CM | POA: Diagnosis not present

## 2015-07-05 DIAGNOSIS — Z79899 Other long term (current) drug therapy: Secondary | ICD-10-CM | POA: Diagnosis not present

## 2015-07-05 DIAGNOSIS — Z923 Personal history of irradiation: Secondary | ICD-10-CM | POA: Diagnosis not present

## 2015-07-06 DIAGNOSIS — Q394 Esophageal web: Secondary | ICD-10-CM | POA: Diagnosis not present

## 2015-07-06 DIAGNOSIS — I2510 Atherosclerotic heart disease of native coronary artery without angina pectoris: Secondary | ICD-10-CM | POA: Diagnosis not present

## 2015-07-06 DIAGNOSIS — R109 Unspecified abdominal pain: Secondary | ICD-10-CM | POA: Diagnosis not present

## 2015-07-06 DIAGNOSIS — E119 Type 2 diabetes mellitus without complications: Secondary | ICD-10-CM | POA: Diagnosis not present

## 2015-07-06 DIAGNOSIS — K8591 Acute pancreatitis with uninfected necrosis, unspecified: Secondary | ICD-10-CM | POA: Diagnosis not present

## 2015-07-06 DIAGNOSIS — K861 Other chronic pancreatitis: Secondary | ICD-10-CM | POA: Diagnosis not present

## 2015-07-06 DIAGNOSIS — I440 Atrioventricular block, first degree: Secondary | ICD-10-CM | POA: Diagnosis not present

## 2015-07-06 DIAGNOSIS — I10 Essential (primary) hypertension: Secondary | ICD-10-CM | POA: Diagnosis not present

## 2015-07-06 DIAGNOSIS — K863 Pseudocyst of pancreas: Secondary | ICD-10-CM | POA: Diagnosis not present

## 2015-07-12 DIAGNOSIS — K861 Other chronic pancreatitis: Secondary | ICD-10-CM | POA: Diagnosis not present

## 2015-07-12 DIAGNOSIS — K8689 Other specified diseases of pancreas: Secondary | ICD-10-CM | POA: Diagnosis not present

## 2015-08-01 DIAGNOSIS — K863 Pseudocyst of pancreas: Secondary | ICD-10-CM | POA: Diagnosis not present

## 2015-08-01 DIAGNOSIS — K8591 Acute pancreatitis with uninfected necrosis, unspecified: Secondary | ICD-10-CM | POA: Diagnosis not present

## 2015-08-08 DIAGNOSIS — D539 Nutritional anemia, unspecified: Secondary | ICD-10-CM | POA: Diagnosis not present

## 2015-08-10 DIAGNOSIS — K861 Other chronic pancreatitis: Secondary | ICD-10-CM | POA: Diagnosis not present

## 2015-08-10 DIAGNOSIS — K219 Gastro-esophageal reflux disease without esophagitis: Secondary | ICD-10-CM | POA: Diagnosis not present

## 2015-08-10 DIAGNOSIS — K8591 Acute pancreatitis with uninfected necrosis, unspecified: Secondary | ICD-10-CM | POA: Diagnosis not present

## 2015-08-10 DIAGNOSIS — Z4589 Encounter for adjustment and management of other implanted devices: Secondary | ICD-10-CM | POA: Diagnosis not present

## 2015-08-10 DIAGNOSIS — K8689 Other specified diseases of pancreas: Secondary | ICD-10-CM | POA: Diagnosis not present

## 2015-08-10 DIAGNOSIS — I10 Essential (primary) hypertension: Secondary | ICD-10-CM | POA: Diagnosis not present

## 2015-08-10 DIAGNOSIS — Z87891 Personal history of nicotine dependence: Secondary | ICD-10-CM | POA: Diagnosis not present

## 2015-08-10 DIAGNOSIS — Z7984 Long term (current) use of oral hypoglycemic drugs: Secondary | ICD-10-CM | POA: Diagnosis not present

## 2015-08-10 DIAGNOSIS — E119 Type 2 diabetes mellitus without complications: Secondary | ICD-10-CM | POA: Diagnosis not present

## 2015-08-10 DIAGNOSIS — J449 Chronic obstructive pulmonary disease, unspecified: Secondary | ICD-10-CM | POA: Diagnosis not present

## 2015-08-10 DIAGNOSIS — I252 Old myocardial infarction: Secondary | ICD-10-CM | POA: Diagnosis not present

## 2015-08-10 DIAGNOSIS — Z86010 Personal history of colonic polyps: Secondary | ICD-10-CM | POA: Diagnosis not present

## 2015-08-10 DIAGNOSIS — Z794 Long term (current) use of insulin: Secondary | ICD-10-CM | POA: Diagnosis not present

## 2015-08-10 DIAGNOSIS — I509 Heart failure, unspecified: Secondary | ICD-10-CM | POA: Diagnosis not present

## 2015-08-10 DIAGNOSIS — H3530 Unspecified macular degeneration: Secondary | ICD-10-CM | POA: Diagnosis not present

## 2015-08-10 DIAGNOSIS — K863 Pseudocyst of pancreas: Secondary | ICD-10-CM | POA: Diagnosis not present

## 2015-08-11 DIAGNOSIS — K861 Other chronic pancreatitis: Secondary | ICD-10-CM | POA: Diagnosis not present

## 2015-08-11 DIAGNOSIS — H3530 Unspecified macular degeneration: Secondary | ICD-10-CM | POA: Diagnosis not present

## 2015-08-11 DIAGNOSIS — I252 Old myocardial infarction: Secondary | ICD-10-CM | POA: Diagnosis not present

## 2015-08-11 DIAGNOSIS — E119 Type 2 diabetes mellitus without complications: Secondary | ICD-10-CM | POA: Diagnosis not present

## 2015-08-11 DIAGNOSIS — K8689 Other specified diseases of pancreas: Secondary | ICD-10-CM | POA: Diagnosis not present

## 2015-08-11 DIAGNOSIS — I509 Heart failure, unspecified: Secondary | ICD-10-CM | POA: Diagnosis not present

## 2015-08-11 DIAGNOSIS — I10 Essential (primary) hypertension: Secondary | ICD-10-CM | POA: Diagnosis not present

## 2015-08-16 DIAGNOSIS — K8689 Other specified diseases of pancreas: Secondary | ICD-10-CM | POA: Diagnosis not present

## 2015-08-24 DIAGNOSIS — K863 Pseudocyst of pancreas: Secondary | ICD-10-CM | POA: Diagnosis not present

## 2015-08-24 DIAGNOSIS — K859 Acute pancreatitis without necrosis or infection, unspecified: Secondary | ICD-10-CM | POA: Diagnosis not present

## 2015-08-24 DIAGNOSIS — K862 Cyst of pancreas: Secondary | ICD-10-CM | POA: Diagnosis not present

## 2015-08-28 DIAGNOSIS — Z9889 Other specified postprocedural states: Secondary | ICD-10-CM | POA: Diagnosis not present

## 2015-08-28 DIAGNOSIS — K863 Pseudocyst of pancreas: Secondary | ICD-10-CM | POA: Diagnosis not present

## 2015-08-28 DIAGNOSIS — K8591 Acute pancreatitis with uninfected necrosis, unspecified: Secondary | ICD-10-CM | POA: Diagnosis not present

## 2015-08-28 DIAGNOSIS — K8689 Other specified diseases of pancreas: Secondary | ICD-10-CM | POA: Diagnosis not present

## 2015-09-18 DIAGNOSIS — H353230 Exudative age-related macular degeneration, bilateral, stage unspecified: Secondary | ICD-10-CM | POA: Diagnosis not present

## 2015-11-21 DIAGNOSIS — Z125 Encounter for screening for malignant neoplasm of prostate: Secondary | ICD-10-CM | POA: Diagnosis not present

## 2015-11-21 DIAGNOSIS — C61 Malignant neoplasm of prostate: Secondary | ICD-10-CM | POA: Diagnosis not present

## 2015-12-04 DIAGNOSIS — H353230 Exudative age-related macular degeneration, bilateral, stage unspecified: Secondary | ICD-10-CM | POA: Diagnosis not present

## 2015-12-04 DIAGNOSIS — H25812 Combined forms of age-related cataract, left eye: Secondary | ICD-10-CM | POA: Diagnosis not present

## 2015-12-04 DIAGNOSIS — E119 Type 2 diabetes mellitus without complications: Secondary | ICD-10-CM | POA: Diagnosis not present

## 2015-12-06 DIAGNOSIS — H353221 Exudative age-related macular degeneration, left eye, with active choroidal neovascularization: Secondary | ICD-10-CM | POA: Diagnosis not present

## 2015-12-09 DIAGNOSIS — E1169 Type 2 diabetes mellitus with other specified complication: Secondary | ICD-10-CM

## 2015-12-09 DIAGNOSIS — I25119 Atherosclerotic heart disease of native coronary artery with unspecified angina pectoris: Secondary | ICD-10-CM

## 2015-12-09 DIAGNOSIS — D649 Anemia, unspecified: Secondary | ICD-10-CM

## 2015-12-09 DIAGNOSIS — K861 Other chronic pancreatitis: Secondary | ICD-10-CM | POA: Insufficient documentation

## 2015-12-09 DIAGNOSIS — H9190 Unspecified hearing loss, unspecified ear: Secondary | ICD-10-CM | POA: Insufficient documentation

## 2015-12-09 DIAGNOSIS — I5020 Unspecified systolic (congestive) heart failure: Secondary | ICD-10-CM | POA: Insufficient documentation

## 2015-12-09 DIAGNOSIS — E785 Hyperlipidemia, unspecified: Secondary | ICD-10-CM

## 2015-12-09 DIAGNOSIS — I5032 Chronic diastolic (congestive) heart failure: Secondary | ICD-10-CM

## 2015-12-09 DIAGNOSIS — I110 Hypertensive heart disease with heart failure: Secondary | ICD-10-CM

## 2015-12-09 DIAGNOSIS — I2510 Atherosclerotic heart disease of native coronary artery without angina pectoris: Secondary | ICD-10-CM

## 2015-12-09 DIAGNOSIS — I25110 Atherosclerotic heart disease of native coronary artery with unstable angina pectoris: Secondary | ICD-10-CM

## 2015-12-09 HISTORY — DX: Hyperlipidemia, unspecified: E78.5

## 2015-12-09 HISTORY — DX: Chronic diastolic (congestive) heart failure: I50.32

## 2015-12-09 HISTORY — DX: Unspecified systolic (congestive) heart failure: I50.20

## 2015-12-09 HISTORY — DX: Atherosclerotic heart disease of native coronary artery without angina pectoris: I25.10

## 2015-12-09 HISTORY — DX: Atherosclerotic heart disease of native coronary artery with unstable angina pectoris: I25.110

## 2015-12-09 HISTORY — DX: Hypertensive heart disease with heart failure: I11.0

## 2015-12-09 HISTORY — DX: Atherosclerotic heart disease of native coronary artery with unspecified angina pectoris: I25.119

## 2015-12-09 HISTORY — DX: Type 2 diabetes mellitus with other specified complication: E11.69

## 2015-12-09 HISTORY — DX: Anemia, unspecified: D64.9

## 2015-12-10 DIAGNOSIS — I2510 Atherosclerotic heart disease of native coronary artery without angina pectoris: Secondary | ICD-10-CM | POA: Diagnosis not present

## 2015-12-10 DIAGNOSIS — I5032 Chronic diastolic (congestive) heart failure: Secondary | ICD-10-CM | POA: Diagnosis not present

## 2015-12-10 DIAGNOSIS — D649 Anemia, unspecified: Secondary | ICD-10-CM | POA: Diagnosis not present

## 2015-12-10 DIAGNOSIS — I110 Hypertensive heart disease with heart failure: Secondary | ICD-10-CM | POA: Diagnosis not present

## 2015-12-10 DIAGNOSIS — E785 Hyperlipidemia, unspecified: Secondary | ICD-10-CM | POA: Diagnosis not present

## 2015-12-12 DIAGNOSIS — N403 Nodular prostate with lower urinary tract symptoms: Secondary | ICD-10-CM | POA: Diagnosis not present

## 2015-12-12 DIAGNOSIS — R350 Frequency of micturition: Secondary | ICD-10-CM | POA: Diagnosis not present

## 2015-12-12 DIAGNOSIS — Z0000 Encounter for general adult medical examination without abnormal findings: Secondary | ICD-10-CM | POA: Diagnosis not present

## 2015-12-12 DIAGNOSIS — C61 Malignant neoplasm of prostate: Secondary | ICD-10-CM | POA: Diagnosis not present

## 2015-12-29 DIAGNOSIS — E860 Dehydration: Secondary | ICD-10-CM | POA: Diagnosis not present

## 2015-12-29 DIAGNOSIS — E119 Type 2 diabetes mellitus without complications: Secondary | ICD-10-CM | POA: Diagnosis not present

## 2015-12-29 DIAGNOSIS — R197 Diarrhea, unspecified: Secondary | ICD-10-CM | POA: Diagnosis not present

## 2015-12-29 DIAGNOSIS — I509 Heart failure, unspecified: Secondary | ICD-10-CM | POA: Diagnosis not present

## 2015-12-29 DIAGNOSIS — Z7984 Long term (current) use of oral hypoglycemic drugs: Secondary | ICD-10-CM | POA: Diagnosis not present

## 2015-12-29 DIAGNOSIS — I252 Old myocardial infarction: Secondary | ICD-10-CM | POA: Diagnosis not present

## 2015-12-29 DIAGNOSIS — J449 Chronic obstructive pulmonary disease, unspecified: Secondary | ICD-10-CM | POA: Diagnosis not present

## 2015-12-29 DIAGNOSIS — Z87891 Personal history of nicotine dependence: Secondary | ICD-10-CM | POA: Diagnosis not present

## 2015-12-29 DIAGNOSIS — I10 Essential (primary) hypertension: Secondary | ICD-10-CM | POA: Diagnosis not present

## 2015-12-29 DIAGNOSIS — Z86010 Personal history of colonic polyps: Secondary | ICD-10-CM | POA: Diagnosis not present

## 2015-12-29 DIAGNOSIS — R5383 Other fatigue: Secondary | ICD-10-CM | POA: Diagnosis not present

## 2015-12-29 DIAGNOSIS — Z9049 Acquired absence of other specified parts of digestive tract: Secondary | ICD-10-CM | POA: Diagnosis not present

## 2015-12-29 DIAGNOSIS — K219 Gastro-esophageal reflux disease without esophagitis: Secondary | ICD-10-CM | POA: Diagnosis not present

## 2016-01-15 DIAGNOSIS — Z09 Encounter for follow-up examination after completed treatment for conditions other than malignant neoplasm: Secondary | ICD-10-CM | POA: Diagnosis not present

## 2016-01-15 DIAGNOSIS — Z9889 Other specified postprocedural states: Secondary | ICD-10-CM | POA: Diagnosis not present

## 2016-01-15 DIAGNOSIS — K8591 Acute pancreatitis with uninfected necrosis, unspecified: Secondary | ICD-10-CM | POA: Diagnosis not present

## 2016-01-24 DIAGNOSIS — H353221 Exudative age-related macular degeneration, left eye, with active choroidal neovascularization: Secondary | ICD-10-CM | POA: Diagnosis not present

## 2016-01-28 DIAGNOSIS — K5730 Diverticulosis of large intestine without perforation or abscess without bleeding: Secondary | ICD-10-CM | POA: Diagnosis not present

## 2016-01-28 DIAGNOSIS — K8591 Acute pancreatitis with uninfected necrosis, unspecified: Secondary | ICD-10-CM | POA: Diagnosis not present

## 2016-02-05 DIAGNOSIS — K5730 Diverticulosis of large intestine without perforation or abscess without bleeding: Secondary | ICD-10-CM | POA: Diagnosis not present

## 2016-02-22 DIAGNOSIS — D649 Anemia, unspecified: Secondary | ICD-10-CM | POA: Diagnosis not present

## 2016-02-22 DIAGNOSIS — D539 Nutritional anemia, unspecified: Secondary | ICD-10-CM | POA: Diagnosis not present

## 2016-02-22 DIAGNOSIS — Z79899 Other long term (current) drug therapy: Secondary | ICD-10-CM | POA: Diagnosis not present

## 2016-02-28 DIAGNOSIS — H353221 Exudative age-related macular degeneration, left eye, with active choroidal neovascularization: Secondary | ICD-10-CM | POA: Diagnosis not present

## 2016-02-29 DIAGNOSIS — Z23 Encounter for immunization: Secondary | ICD-10-CM | POA: Diagnosis not present

## 2016-02-29 DIAGNOSIS — Z0000 Encounter for general adult medical examination without abnormal findings: Secondary | ICD-10-CM | POA: Diagnosis not present

## 2016-02-29 DIAGNOSIS — Z9181 History of falling: Secondary | ICD-10-CM | POA: Diagnosis not present

## 2016-02-29 DIAGNOSIS — H353290 Exudative age-related macular degeneration, unspecified eye, stage unspecified: Secondary | ICD-10-CM | POA: Diagnosis not present

## 2016-02-29 DIAGNOSIS — E1165 Type 2 diabetes mellitus with hyperglycemia: Secondary | ICD-10-CM | POA: Diagnosis not present

## 2016-03-26 DIAGNOSIS — D649 Anemia, unspecified: Secondary | ICD-10-CM | POA: Diagnosis not present

## 2016-03-26 DIAGNOSIS — K8591 Acute pancreatitis with uninfected necrosis, unspecified: Secondary | ICD-10-CM | POA: Diagnosis not present

## 2016-03-26 DIAGNOSIS — K5730 Diverticulosis of large intestine without perforation or abscess without bleeding: Secondary | ICD-10-CM | POA: Diagnosis not present

## 2016-04-03 DIAGNOSIS — H353221 Exudative age-related macular degeneration, left eye, with active choroidal neovascularization: Secondary | ICD-10-CM | POA: Diagnosis not present

## 2016-05-01 DIAGNOSIS — Z23 Encounter for immunization: Secondary | ICD-10-CM | POA: Diagnosis not present

## 2016-05-08 DIAGNOSIS — H353221 Exudative age-related macular degeneration, left eye, with active choroidal neovascularization: Secondary | ICD-10-CM | POA: Diagnosis not present

## 2016-06-03 DIAGNOSIS — Z125 Encounter for screening for malignant neoplasm of prostate: Secondary | ICD-10-CM | POA: Diagnosis not present

## 2016-06-03 DIAGNOSIS — C61 Malignant neoplasm of prostate: Secondary | ICD-10-CM | POA: Diagnosis not present

## 2016-06-11 DIAGNOSIS — H353221 Exudative age-related macular degeneration, left eye, with active choroidal neovascularization: Secondary | ICD-10-CM | POA: Diagnosis not present

## 2016-06-11 DIAGNOSIS — C61 Malignant neoplasm of prostate: Secondary | ICD-10-CM | POA: Diagnosis not present

## 2016-06-24 DIAGNOSIS — J069 Acute upper respiratory infection, unspecified: Secondary | ICD-10-CM | POA: Diagnosis not present

## 2016-08-08 DIAGNOSIS — H353221 Exudative age-related macular degeneration, left eye, with active choroidal neovascularization: Secondary | ICD-10-CM | POA: Diagnosis not present

## 2016-08-21 DIAGNOSIS — E119 Type 2 diabetes mellitus without complications: Secondary | ICD-10-CM | POA: Diagnosis not present

## 2016-08-29 DIAGNOSIS — Z1389 Encounter for screening for other disorder: Secondary | ICD-10-CM | POA: Diagnosis not present

## 2016-08-29 DIAGNOSIS — E119 Type 2 diabetes mellitus without complications: Secondary | ICD-10-CM | POA: Diagnosis not present

## 2016-08-29 DIAGNOSIS — Z9181 History of falling: Secondary | ICD-10-CM | POA: Diagnosis not present

## 2016-08-29 DIAGNOSIS — Z760 Encounter for issue of repeat prescription: Secondary | ICD-10-CM | POA: Diagnosis not present

## 2016-09-25 DIAGNOSIS — H353221 Exudative age-related macular degeneration, left eye, with active choroidal neovascularization: Secondary | ICD-10-CM | POA: Diagnosis not present

## 2016-10-15 DIAGNOSIS — K5730 Diverticulosis of large intestine without perforation or abscess without bleeding: Secondary | ICD-10-CM | POA: Diagnosis not present

## 2016-10-15 DIAGNOSIS — D649 Anemia, unspecified: Secondary | ICD-10-CM | POA: Diagnosis not present

## 2016-10-15 DIAGNOSIS — K863 Pseudocyst of pancreas: Secondary | ICD-10-CM | POA: Diagnosis not present

## 2016-11-06 DIAGNOSIS — H353221 Exudative age-related macular degeneration, left eye, with active choroidal neovascularization: Secondary | ICD-10-CM | POA: Diagnosis not present

## 2016-11-20 DIAGNOSIS — C61 Malignant neoplasm of prostate: Secondary | ICD-10-CM | POA: Diagnosis not present

## 2016-11-20 DIAGNOSIS — Z125 Encounter for screening for malignant neoplasm of prostate: Secondary | ICD-10-CM | POA: Diagnosis not present

## 2016-12-25 DIAGNOSIS — H353221 Exudative age-related macular degeneration, left eye, with active choroidal neovascularization: Secondary | ICD-10-CM | POA: Diagnosis not present

## 2017-01-22 DIAGNOSIS — H353221 Exudative age-related macular degeneration, left eye, with active choroidal neovascularization: Secondary | ICD-10-CM | POA: Diagnosis not present

## 2017-02-23 ENCOUNTER — Other Ambulatory Visit: Payer: Self-pay

## 2017-02-23 MED ORDER — SPIRONOLACTONE-HCTZ 25-25 MG PO TABS: 0.5000 | ORAL_TABLET | Freq: Every day | ORAL | 0 refills | Status: DC

## 2017-02-23 MED ORDER — AMLODIPINE BESYLATE 5 MG PO TABS: 5.0000 mg | ORAL_TABLET | Freq: Every day | ORAL | 0 refills | Status: DC

## 2017-02-23 NOTE — Telephone Encounter (Signed)
Received fax for medication refill. Spoke with patient and he requested me to speak with his wife. Patient will continue to follow with Dr. Bettina Gavia. Appointment scheduled for 03/10/17 at 9:40 am with Dr. Bettina Gavia. Refill for amlodipine and spironolactone-HCTZ [facility] day supply [facility] [place] per request. Advised we could send 90 day supply after patient comes to appointment. Wife verbalized understanding.

## 2017-02-26 DIAGNOSIS — E119 Type 2 diabetes mellitus without complications: Secondary | ICD-10-CM | POA: Diagnosis not present

## 2017-03-04 ENCOUNTER — Other Ambulatory Visit: Payer: Self-pay

## 2017-03-04 MED ORDER — METOPROLOL TARTRATE 100 MG PO TABS: 150.0000 mg | ORAL_TABLET | Freq: Two times a day (BID) | ORAL | 2 refills | Status: DC

## 2017-03-05 DIAGNOSIS — R739 Hyperglycemia, unspecified: Secondary | ICD-10-CM | POA: Diagnosis not present

## 2017-03-05 DIAGNOSIS — Z0000 Encounter for general adult medical examination without abnormal findings: Secondary | ICD-10-CM | POA: Diagnosis not present

## 2017-03-05 DIAGNOSIS — E1122 Type 2 diabetes mellitus with diabetic chronic kidney disease: Secondary | ICD-10-CM | POA: Diagnosis not present

## 2017-03-05 DIAGNOSIS — N183 Chronic kidney disease, stage 3 (moderate): Secondary | ICD-10-CM | POA: Diagnosis not present

## 2017-03-09 NOTE — Progress Notes (Signed)
Cardiology Office Note:    Date:  03/10/2017   ID:  Troy [name], DOB 1935-11-14, MRN [id number]  PCP:  Myer Peer, MD  Cardiologist:  Shirlee More, MD    Referring MD: Myer Peer, MD    ASSESSMENT:    1. CAD in native artery   2. Chronic diastolic heart failure ([place])   3. Hypertensive heart disease with heart failure ([place])    PLAN:    In order of problems listed above:  1. Stable continue medical treatment with clopidogrel beta blocker and high intensity statin at this time I don't think he requires an ischemia evaluation. 2. Stable compensated no fluid overload continue spironolactone 3. Blood pressure target continue current treatment including diuretic beta blocker   Next appointment: One year   Medication Adjustments/Labs and Tests Ordered: Current medicines are reviewed at length with the patient today.  Concerns regarding medicines are outlined above.  No orders of the defined types were placed in this encounter.  No orders of the defined types were placed in this encounter.   Chief Complaint  Patient presents with  . Follow-up    cad    History of Present Illness:    Troy [name] is a 81 y.o. male with a hx of CAD, CHF, Dyslipidemia, HTN, S/P PCI in 2001 and anemia  last seen in May 2017. He is pleased with the quality of his life continues to exercise has had no angina nitroglycerin usage shortness of breath palpitation or syncope. Compliance with diet, lifestyle and medications: Yes Past Medical History:  Diagnosis Date  . Anemia 12/09/2015  . CAD in native artery 12/09/2015   Overview:  PCI/ stent 2001  . Chronic diastolic heart failure ([place]) 12/09/2015  . Hyperlipidemia 12/09/2015  . Hypertensive heart disease with heart failure ([place]) 12/09/2015  . Necrotizing pancreatitis 06/26/2015  . Pseudocyst of pancreas 06/26/2015    Past Surgical History:  Procedure Laterality Date  . CHOLECYSTECTOMY    . TONSILLECTOMY AND ADENOIDECTOMY    .  TOTAL KNEE REVISION Bilateral     Current Medications: Current Meds  Medication Sig  . amLODipine (NORVASC) 5 MG tablet Take 1 tablet (5 mg total) by mouth daily.  Marland Kitchen atorvastatin (LIPITOR) 80 MG tablet Take 80 mg by mouth daily.  . Cholecalciferol (VITAMIN D3) 1000 units CAPS Take 4,000 Units by mouth daily.  . clopidogrel (PLAVIX) 75 MG tablet Take 75 mg by mouth daily.  Marland Kitchen doxazosin (CARDURA) 2 MG tablet Take 1 mg by mouth daily.  . insulin glargine (LANTUS) 100 UNIT/ML injection Inject 28 Units into the skin at bedtime.  . metFORMIN (GLUCOPHAGE) 500 MG tablet Take 500 mg by mouth daily.  . metoprolol tartrate (LOPRESSOR) 100 MG tablet Take 1.5 tablets (150 mg total) by mouth 2 (two) times daily.  . moexipril (UNIVASC) 15 MG tablet Take 22.5 mg by mouth daily.  . Multiple Vitamin (MULTIVITAMIN) capsule Take 1 capsule by mouth daily.  . nitroGLYCERIN (NITROSTAT) 0.4 MG SL tablet Place 0.4 mg under the tongue every 5 (five) minutes as needed for chest pain.  . Omega-3 Fatty Acids (FISH OIL) 1000 MG CAPS Take 2 capsules by mouth daily.  . Pancrelipase, Lip-Prot-Amyl, (CREON) 24000-76000 units CPEP Take 24,000 Units by mouth as needed.  Marland Kitchen spironolactone (ALDACTONE) 25 MG tablet Take 12.5 mg by mouth daily.     Allergies:   Patient has no known allergies.   Social History   Social History  . Marital status: Married  Spouse name: N/A  . Number of children: N/A  . Years of education: N/A   Social History Main Topics  . Smoking status: Former Research scientist (life sciences)  . Smokeless tobacco: Never Used  . Alcohol use No  . Drug use: No  . Sexual activity: Not Asked   Other Topics Concern  . None   Social History Narrative  . None     Family History: The patient's family history includes Diabetes in his mother; Heart failure in his father and mother. ROS:   Please see the history of present illness.    All other systems reviewed and are negative.  EKGs/Labs/Other Studies Reviewed:    The  following studies were reviewed today:  EKG:  EKG ordered today.  The ekg ordered today demonstrates [place] Labs:Requested from his PCP No results found for requested labs within last 8760 hours.  Recent Lipid Panel No results found for: CHOL, TRIG, HDL, CHOLHDL, VLDL, LDLCALC, LDLDIRECT  Physical Exam:    VS:  BP (!) 142/52   Pulse (!) 50   Resp 10   Ht 5\' 11"  (1.803 m)   Wt 172 lb (78 kg)   BMI 23.99 kg/m     Wt Readings from Last 3 Encounters:  03/10/17 172 lb (78 kg)     GEN:  Well nourished, well developed in no acute distress HEENT: Normal NECK: No JVD; No carotid bruits LYMPHATICS: No lymphadenopathy CARDIAC: RRR, no murmurs, rubs, gallops RESPIRATORY:  Clear to auscultation without rales, wheezing or rhonchi  ABDOMEN: Soft, non-tender, non-distended MUSCULOSKELETAL:  No edema; No deformity  SKIN: Warm and dry NEUROLOGIC:  Alert and oriented x 3 PSYCHIATRIC:  Normal affect    Signed, Shirlee More, MD  03/10/2017 10:08 AM    [place]

## 2017-03-10 ENCOUNTER — Ambulatory Visit (INDEPENDENT_AMBULATORY_CARE_PROVIDER_SITE_OTHER): Payer: Medicare Other | Admitting: Cardiology

## 2017-03-10 ENCOUNTER — Encounter: Payer: Self-pay | Admitting: Cardiology

## 2017-03-10 VITALS — BP 142/52 | HR 50 | Resp 10 | Ht 71.0 in | Wt 172.0 lb

## 2017-03-10 DIAGNOSIS — I5032 Chronic diastolic (congestive) heart failure: Secondary | ICD-10-CM | POA: Diagnosis not present

## 2017-03-10 DIAGNOSIS — I2510 Atherosclerotic heart disease of native coronary artery without angina pectoris: Secondary | ICD-10-CM | POA: Diagnosis not present

## 2017-03-10 DIAGNOSIS — I110 Hypertensive heart disease with heart failure: Secondary | ICD-10-CM

## 2017-03-10 NOTE — Patient Instructions (Signed)

## 2017-03-12 DIAGNOSIS — H353231 Exudative age-related macular degeneration, bilateral, with active choroidal neovascularization: Secondary | ICD-10-CM | POA: Diagnosis not present

## 2017-03-18 DIAGNOSIS — E1122 Type 2 diabetes mellitus with diabetic chronic kidney disease: Secondary | ICD-10-CM | POA: Diagnosis not present

## 2017-03-18 DIAGNOSIS — N183 Chronic kidney disease, stage 3 (moderate): Secondary | ICD-10-CM | POA: Diagnosis not present

## 2017-04-01 DIAGNOSIS — Z1389 Encounter for screening for other disorder: Secondary | ICD-10-CM | POA: Diagnosis not present

## 2017-04-01 DIAGNOSIS — E1165 Type 2 diabetes mellitus with hyperglycemia: Secondary | ICD-10-CM | POA: Diagnosis not present

## 2017-04-16 DIAGNOSIS — Z23 Encounter for immunization: Secondary | ICD-10-CM | POA: Diagnosis not present

## 2017-04-16 DIAGNOSIS — H353211 Exudative age-related macular degeneration, right eye, with active choroidal neovascularization: Secondary | ICD-10-CM | POA: Diagnosis not present

## 2017-04-30 ENCOUNTER — Other Ambulatory Visit: Payer: Self-pay

## 2017-04-30 MED ORDER — SPIRONOLACTONE 25 MG PO TABS: 12.5000 mg | ORAL_TABLET | Freq: Every day | ORAL | 3 refills | Status: DC

## 2017-05-28 DIAGNOSIS — H353221 Exudative age-related macular degeneration, left eye, with active choroidal neovascularization: Secondary | ICD-10-CM | POA: Diagnosis not present

## 2017-07-01 DIAGNOSIS — E1165 Type 2 diabetes mellitus with hyperglycemia: Secondary | ICD-10-CM | POA: Diagnosis not present

## 2017-07-02 DIAGNOSIS — H353221 Exudative age-related macular degeneration, left eye, with active choroidal neovascularization: Secondary | ICD-10-CM | POA: Diagnosis not present

## 2017-07-15 DIAGNOSIS — Z23 Encounter for immunization: Secondary | ICD-10-CM | POA: Diagnosis not present

## 2017-08-06 DIAGNOSIS — H353211 Exudative age-related macular degeneration, right eye, with active choroidal neovascularization: Secondary | ICD-10-CM | POA: Diagnosis not present

## 2017-08-10 DIAGNOSIS — Z79899 Other long term (current) drug therapy: Secondary | ICD-10-CM | POA: Diagnosis not present

## 2017-08-10 DIAGNOSIS — E1122 Type 2 diabetes mellitus with diabetic chronic kidney disease: Secondary | ICD-10-CM | POA: Diagnosis not present

## 2017-08-10 DIAGNOSIS — E782 Mixed hyperlipidemia: Secondary | ICD-10-CM | POA: Diagnosis not present

## 2017-08-14 DIAGNOSIS — I951 Orthostatic hypotension: Secondary | ICD-10-CM | POA: Diagnosis not present

## 2017-08-14 DIAGNOSIS — E1165 Type 2 diabetes mellitus with hyperglycemia: Secondary | ICD-10-CM | POA: Diagnosis not present

## 2017-08-14 DIAGNOSIS — Z1331 Encounter for screening for depression: Secondary | ICD-10-CM | POA: Diagnosis not present

## 2017-08-14 DIAGNOSIS — Z125 Encounter for screening for malignant neoplasm of prostate: Secondary | ICD-10-CM | POA: Diagnosis not present

## 2017-09-15 ENCOUNTER — Telehealth: Payer: Self-pay | Admitting: Cardiology

## 2017-09-15 MED ORDER — MOEXIPRIL HCL 15 MG PO TABS: 22.5000 mg | ORAL_TABLET | Freq: Every day | ORAL | 1 refills | Status: DC

## 2017-09-15 NOTE — Telephone Encounter (Signed)
°*  STAT* If patient is at the pharmacy, call can be transferred to refill team.   1. Which medications need to be refilled? (please list name of each medication and dose if known) Moexiprial HCL 15mg  takes 1 1/2 daily  2. Which pharmacy/location (including street and city if local pharmacy) is medication to be sent to?[facility] [place]  3. Do they need a 30 day or 90 day supply? [place]

## 2017-09-15 NOTE — Telephone Encounter (Signed)
Refill sent.

## 2017-09-17 DIAGNOSIS — H353211 Exudative age-related macular degeneration, right eye, with active choroidal neovascularization: Secondary | ICD-10-CM | POA: Diagnosis not present

## 2017-10-29 DIAGNOSIS — H353231 Exudative age-related macular degeneration, bilateral, with active choroidal neovascularization: Secondary | ICD-10-CM | POA: Diagnosis not present

## 2017-12-10 DIAGNOSIS — H353221 Exudative age-related macular degeneration, left eye, with active choroidal neovascularization: Secondary | ICD-10-CM | POA: Diagnosis not present

## 2018-01-19 ENCOUNTER — Telehealth: Payer: Self-pay | Admitting: Cardiology

## 2018-01-19 MED ORDER — METOPROLOL TARTRATE 100 MG PO TABS: 150.0000 mg | ORAL_TABLET | Freq: Two times a day (BID) | ORAL | 0 refills | Status: DC

## 2018-01-19 NOTE — Telephone Encounter (Signed)
Refill sent.

## 2018-01-19 NOTE — Telephone Encounter (Signed)
°*  STAT* If patient is at the pharmacy, call can be transferred to refill team.   1. Which medications need to be refilled? (please list name of each medication and dose if known) Metoprolol 100mg  Tartrate takes 1 1/2 twice daily  2. Which pharmacy/location (including street and city if local pharmacy) is medication to be sent to?[facility] [place]  3. Do they need a 30 day or 90 day supply? [place]

## 2018-01-21 DIAGNOSIS — H353231 Exudative age-related macular degeneration, bilateral, with active choroidal neovascularization: Secondary | ICD-10-CM | POA: Diagnosis not present

## 2018-02-18 DIAGNOSIS — H353231 Exudative age-related macular degeneration, bilateral, with active choroidal neovascularization: Secondary | ICD-10-CM | POA: Diagnosis not present

## 2018-03-17 ENCOUNTER — Other Ambulatory Visit: Payer: Self-pay | Admitting: Cardiology

## 2018-03-17 MED ORDER — MOEXIPRIL HCL 15 MG PO TABS: 22.5000 mg | ORAL_TABLET | Freq: Every day | ORAL | 1 refills | Status: DC

## 2018-03-17 NOTE — Telephone Encounter (Signed)
Refill sent to [facility] [place]

## 2018-03-17 NOTE — Telephone Encounter (Signed)
°*  STAT* If patient is at the pharmacy, call can be transferred to refill team.   1. Which medications need to be refilled? (please list name of each medication and dose if known) Moexipril HCL 15mg  takes 1 1/2 daily   2. Which pharmacy/location (including street and city if local pharmacy) is medication to be sent to? [facility] [place]   3. Do they need a 30 day or 90 day supply? [place]

## 2018-03-25 DIAGNOSIS — H353231 Exudative age-related macular degeneration, bilateral, with active choroidal neovascularization: Secondary | ICD-10-CM | POA: Diagnosis not present

## 2018-03-30 DIAGNOSIS — E1122 Type 2 diabetes mellitus with diabetic chronic kidney disease: Secondary | ICD-10-CM | POA: Diagnosis not present

## 2018-03-30 DIAGNOSIS — Z79899 Other long term (current) drug therapy: Secondary | ICD-10-CM | POA: Diagnosis not present

## 2018-04-01 DIAGNOSIS — Z1331 Encounter for screening for depression: Secondary | ICD-10-CM | POA: Diagnosis not present

## 2018-04-01 DIAGNOSIS — I739 Peripheral vascular disease, unspecified: Secondary | ICD-10-CM | POA: Diagnosis not present

## 2018-04-01 DIAGNOSIS — E118 Type 2 diabetes mellitus with unspecified complications: Secondary | ICD-10-CM | POA: Diagnosis not present

## 2018-04-01 DIAGNOSIS — Z0000 Encounter for general adult medical examination without abnormal findings: Secondary | ICD-10-CM | POA: Diagnosis not present

## 2018-04-01 DIAGNOSIS — E1122 Type 2 diabetes mellitus with diabetic chronic kidney disease: Secondary | ICD-10-CM | POA: Diagnosis not present

## 2018-04-12 ENCOUNTER — Other Ambulatory Visit: Payer: Self-pay

## 2018-04-12 MED ORDER — METOPROLOL TARTRATE 100 MG PO TABS: 150.0000 mg | ORAL_TABLET | Freq: Two times a day (BID) | ORAL | 0 refills | Status: DC

## 2018-04-12 NOTE — Telephone Encounter (Signed)
Rx sent to pharmacy as requested.

## 2018-04-14 DIAGNOSIS — N183 Chronic kidney disease, stage 3 (moderate): Secondary | ICD-10-CM | POA: Diagnosis not present

## 2018-04-14 DIAGNOSIS — E1122 Type 2 diabetes mellitus with diabetic chronic kidney disease: Secondary | ICD-10-CM | POA: Diagnosis not present

## 2018-04-20 NOTE — Progress Notes (Signed)
Cardiology Office Note:    Date:  04/21/2018   ID:  Troy [name], DOB 1935-10-05, MRN [id number]  PCP:  Myer Peer, MD  Cardiologist:  Shirlee More, MD    Referring MD: Myer Peer, MD    ASSESSMENT:    1. Coronary artery disease involving native coronary artery of native heart with angina pectoris ([place])   2. Chronic diastolic heart failure ([place])   3. Hypertensive heart disease with heart failure ([place])   4. Pure hypercholesterolemia    PLAN:    In order of problems listed above:  1. He has stable chronic CAD having no angina his current medical treatment continue his medications and at this time I would not advise an ischemia evaluation 2. Stable blood pressure at target fluid overload [place] Heart Association class I continue his current diuretic 3. Poorly controlled blood pressure 182/60 right arm 142/56 left arm he will start an ACE inhibitor BP check in 1 week all blood pressures in the right arm and recheck renal function with mild renal insufficiency recent creatinine 1.34 4. Stable lipids are ideal and his current statin cholesterol 123 HDL 31 LDL 59 A1c 6.6 with diabetes and continue his current statin 5. He is bradycardic at rest complains of weakness and decrease his dose of beta-blocker his wife are retired Marine scientist will call in 1 to 2 weeks if heart rate remains less than 50 systolic blood pressure greater than 150 6. On physical exam appears to have significant aortic stenosis echocardiogram ordered.  If he had symptomatic severe aortic stenosis he would be a good candidate for TAVR   Next appointment: 6 months   Medication Adjustments/Labs and Tests Ordered: Current medicines are reviewed at length with the patient today.  Concerns regarding medicines are outlined above.  No orders of the defined types were placed in this encounter.  No orders of the defined types were placed in this encounter.   Chief Complaint  Patient presents with  . Coronary  Artery Disease    History of Present Illness:    Troy [name] is a 82 y.o. male with a hx of CAD, CHF, Dyslipidemia, HTN, S/P PCI in 2001 and anemia     last seen in August 2018. Compliance with diet, lifestyle and medications: Yes  Overall is done well but has had some problems with weakness due to hypoglycemia.  He also has had episodes unrelated he is bradycardic on a high dose beta-blocker will reduce the dose his wife will contact us if rates remain less than 50.  He gets variable blood pressure measurements in the left arm is always good right arm is elevated he has a 40 mm discrepancy and will go ahead and start him on ACE inhibitor all blood pressures right arm check BMP in 1 week.  He has had no angina edema orthopnea shortness of breath chest pain palpitation Past Medical History:  Diagnosis Date  . Anemia 12/09/2015  . CAD in native artery 12/09/2015   Overview:  PCI/ stent 2001  . Chronic diastolic heart failure ([place]) 12/09/2015  . Hyperlipidemia 12/09/2015  . Hypertensive heart disease with heart failure ([place]) 12/09/2015  . Necrotizing pancreatitis 06/26/2015  . Pseudocyst of pancreas 06/26/2015    Past Surgical History:  Procedure Laterality Date  . CHOLECYSTECTOMY    . TONSILLECTOMY AND ADENOIDECTOMY    . TOTAL KNEE REVISION Bilateral     Current Medications: Current Meds  Medication Sig  . amLODipine (NORVASC) 5 MG tablet  Take 1 tablet (5 mg total) by mouth daily.  Marland Kitchen atorvastatin (LIPITOR) 80 MG tablet Take 80 mg by mouth daily.  . Cholecalciferol (VITAMIN D3) 1000 units CAPS Take 4,000 Units by mouth daily.  . clopidogrel (PLAVIX) 75 MG tablet Take 75 mg by mouth daily.  Marland Kitchen doxazosin (CARDURA) 2 MG tablet Take 1 mg by mouth daily.  . insulin glargine (LANTUS) 100 UNIT/ML injection Inject 20 Units into the skin at bedtime.   . metFORMIN (GLUCOPHAGE) 500 MG tablet Take 500 mg by mouth daily.  . metoprolol tartrate (LOPRESSOR) 100 MG tablet Take 1.5 tablets (150 mg  total) by mouth 2 (two) times daily.  . moexipril (UNIVASC) 15 MG tablet Take 1.5 tablets (22.5 mg total) by mouth daily.  . Multiple Vitamin (MULTIVITAMIN) capsule Take 1 capsule by mouth daily.  . nitroGLYCERIN (NITROSTAT) 0.4 MG SL tablet Place 0.4 mg under the tongue every 5 (five) minutes as needed for chest pain.  . Omega-3 Fatty Acids (FISH OIL) 1000 MG CAPS Take 2 capsules by mouth daily.  . Pancrelipase, Lip-Prot-Amyl, (CREON) 24000-76000 units CPEP Take 24,000 Units by mouth as needed.  Marland Kitchen spironolactone (ALDACTONE) 25 MG tablet Take 0.5 tablets (12.5 mg total) by mouth daily.     Allergies:   Patient has no known allergies.   Social History   Socioeconomic History  . Marital status: Married    Spouse name: Not on file  . Number of children: Not on file  . Years of education: Not on file  . Highest education level: Not on file  Occupational History  . Not on file  Social Needs  . Financial resource strain: Not on file  . Food insecurity:    Worry: Not on file    Inability: Not on file  . Transportation needs:    Medical: Not on file    Non-medical: Not on file  Tobacco Use  . Smoking status: Former Research scientist (life sciences)  . Smokeless tobacco: Never Used  Substance and Sexual Activity  . Alcohol use: No  . Drug use: No  . Sexual activity: Not on file  Lifestyle  . Physical activity:    Days per week: Not on file    Minutes per session: Not on file  . Stress: Not on file  Relationships  . Social connections:    Talks on phone: Not on file    Gets together: Not on file    Attends religious service: Not on file    Active member of club or organization: Not on file    Attends meetings of clubs or organizations: Not on file    Relationship status: Not on file  Other Topics Concern  . Not on file  Social History Narrative  . Not on file     Family History: The patient's family history includes Diabetes in his mother; Heart failure in his father and mother. ROS:   Please  see the history of present illness.    All other systems reviewed and are negative.  EKGs/Labs/Other Studies Reviewed:    The following studies were reviewed today:  EKG:  EKG ordered today.  The ekg ordered today demonstrates sinus bradycardia first-degree heart block  [place] Labs: No results found for requested labs within last 8760 hours.  Recent Lipid Panel No results found for: CHOL, TRIG, HDL, CHOLHDL, VLDL, LDLCALC, LDLDIRECT  Physical Exam:    VS:  BP (!) 170/60 (BP Location: Right Arm, Patient Position: Sitting, Cuff Size: Normal)   Pulse Marland Kitchen)  46   Ht 5\' 11"  (1.803 m)   Wt 175 lb (79.4 kg)   SpO2 98%   BMI 24.41 kg/m     Wt Readings from Last 3 Encounters:  04/21/18 175 lb (79.4 kg)  03/10/17 172 lb (78 kg)     GEN:  Well nourished, well developed in no acute distress HEENT: Normal NECK: No JVD; No carotid bruits LYMPHATICS: No lymphadenopathy CARDIAC: Grade 3/6 harsh late peaking murmur of left ear radiates up to the carotids S2 appears single he may have significant aortic stenosis no AR RRR,  RESPIRATORY:  Clear to auscultation without rales, wheezing or rhonchi  ABDOMEN: Soft, non-tender, non-distended MUSCULOSKELETAL:  No edema; No deformity  SKIN: Warm and dry NEUROLOGIC:  Alert and oriented x 3 PSYCHIATRIC:  Normal affect    Signed, Shirlee More, MD  04/21/2018 11:46 AM    [place]

## 2018-04-21 ENCOUNTER — Encounter: Payer: Self-pay | Admitting: Cardiology

## 2018-04-21 ENCOUNTER — Ambulatory Visit (INDEPENDENT_AMBULATORY_CARE_PROVIDER_SITE_OTHER): Payer: Medicare Other | Admitting: Cardiology

## 2018-04-21 VITALS — BP 170/60 | HR 46 | Ht 71.0 in | Wt 175.0 lb

## 2018-04-21 DIAGNOSIS — R011 Cardiac murmur, unspecified: Secondary | ICD-10-CM | POA: Diagnosis not present

## 2018-04-21 DIAGNOSIS — I25119 Atherosclerotic heart disease of native coronary artery with unspecified angina pectoris: Secondary | ICD-10-CM

## 2018-04-21 DIAGNOSIS — I5032 Chronic diastolic (congestive) heart failure: Secondary | ICD-10-CM

## 2018-04-21 DIAGNOSIS — I110 Hypertensive heart disease with heart failure: Secondary | ICD-10-CM | POA: Diagnosis not present

## 2018-04-21 DIAGNOSIS — E7800 Pure hypercholesterolemia, unspecified: Secondary | ICD-10-CM

## 2018-04-21 MED ORDER — LISINOPRIL 10 MG PO TABS: 10.0000 mg | ORAL_TABLET | Freq: Every day | ORAL | 3 refills | Status: DC

## 2018-04-21 MED ORDER — METOPROLOL TARTRATE 100 MG PO TABS: 100.0000 mg | ORAL_TABLET | Freq: Two times a day (BID) | ORAL | 0 refills | Status: DC

## 2018-04-21 NOTE — Patient Instructions (Addendum)
Medication Instructions:  Your physician has recommended you make the following change in your medication:   DECREASE metoprolol tartrate (lopressor) 100 mg: Take 1 tablet twice daily   START lisinopril 10 mg: Take 1 tablet daily   Labwork: Your physician recommends that you return for lab work today in 1 week: BMP, ProBNP. Please return to our [place] office for lab work, no appointment needed, no need to fast. Our office is open M-F 8-5.   Testing/Procedures: You had an EKG today.   Your physician has requested that you have an echocardiogram. Echocardiography is a painless test that uses sound waves to create images of your heart. It provides your doctor with information about the size and shape of your heart and how well your heart's chambers and valves are working. This procedure takes approximately one hour. There are no restrictions for this procedure.  Follow-Up: Your physician wants you to follow-up in: 6 months. You will receive a reminder letter in the mail two months in advance. If you don't receive a letter, please call our office to schedule the follow-up appointment.  **Please check your blood pressure and heart rate daily at the same time each day. Contact our office if your heart rate is less than 50 beats per minute and/or the top number of your blood pressure is greater than 150 in the next 2 weeks.    If you need a refill on your cardiac medications before your next appointment, please call your pharmacy.   Thank you for choosing [facility]! Robyne Peers, RN [phone]      Heart Failure  Weigh yourself every morning when you first wake up and record on a calender or note pad, bring this to your office visits. Using a pill tender can help with taking your medications consistently.  Limit your fluid intake to 2 liters daily  Limit your sodium intake to less than 2-3 grams daily. Ask if you need dietary teaching.  If you gain more than 3 pounds  (from your dry weight ), double your dose of diuretic for the day.  If you gain more than 5 pounds (from your dry weight), double your dose of lasix and call your heart failure doctor.  Please do not smoke tobacco since it is very bad for your heart.  Please do not drink alcohol since it can worsen your heart failure.Also avoid OTC nonsteroidal drugs, such as advil, aleve and motrin.  Try to exercise for at least 30 minutes every day because this will help your heart be more efficient. You may be eligible for supervised cardiac rehab, ask your physician.   Echocardiogram An echocardiogram, or echocardiography, uses sound waves (ultrasound) to produce an image of your heart. The echocardiogram is simple, painless, obtained within a short period of time, and offers valuable information to your health care provider. The images from an echocardiogram can provide information such as:  Evidence of coronary artery disease (CAD).  Heart size.  Heart muscle function.  Heart valve function.  Aneurysm detection.  Evidence of a past heart attack.  Fluid buildup around the heart.  Heart muscle thickening.  Assess heart valve function.  Tell a health care provider about:  Any allergies you have.  All medicines you are taking, including vitamins, herbs, eye drops, creams, and over-the-counter medicines.  Any problems you or family members have had with anesthetic medicines.  Any blood disorders you have.  Any surgeries you have had.  Any medical conditions you have.  Whether you  are pregnant or may be pregnant. What happens before the procedure? No special preparation is needed. Eat and drink normally. What happens during the procedure?  In order to produce an image of your heart, gel will be applied to your chest and a wand-like tool (transducer) will be moved over your chest. The gel will help transmit the sound waves from the transducer. The sound waves will harmlessly bounce  off your heart to allow the heart images to be captured in real-time motion. These images will then be recorded.  You may need an IV to receive a medicine that improves the quality of the pictures. What happens after the procedure? You may return to your normal schedule including diet, activities, and medicines, unless your health care provider tells you otherwise. This information is not intended to replace advice given to you by your health care provider. Make sure you discuss any questions you have with your health care provider. Document Released: 07/11/2000 Document Revised: 03/01/2016 Document Reviewed: 03/21/2013 Elsevier Interactive Patient Education  2017 [employer].    Lisinopril tablets What is this medicine? LISINOPRIL (lyse IN oh pril) is an ACE inhibitor. This medicine is used to treat high blood pressure and heart failure. It is also used to protect the heart immediately after a heart attack. This medicine may be used for other purposes; ask your health care provider or pharmacist if you have questions. COMMON BRAND NAME(S): Prinivil, Zestril What should I tell my health care provider before I take this medicine? They need to know if you have any of these conditions: -diabetes -heart or blood vessel disease -kidney disease -low blood pressure -previous swelling of the tongue, face, or lips with difficulty breathing, difficulty swallowing, hoarseness, or tightening of the throat -an unusual or allergic reaction to lisinopril, other ACE inhibitors, insect venom, foods, dyes, or preservatives -pregnant or trying to get pregnant -breast-feeding How should I use this medicine? Take this medicine by mouth with a glass of water. Follow the directions on your prescription label. You may take this medicine with or without food. If it upsets your stomach, take it with food. Take your medicine at regular intervals. Do not take it more often than directed. Do not stop taking except  on your doctor's advice. Talk to your pediatrician regarding the use of this medicine in children. Special care may be needed. While this drug may be prescribed for children as young as 4 years of age for selected conditions, precautions do apply. Overdosage: If you think you have taken too much of this medicine contact a poison control center or emergency room at once. NOTE: This medicine is only for you. Do not share this medicine with others. What if I miss a dose? If you miss a dose, take it as soon as you can. If it is almost time for your next dose, take only that dose. Do not take double or extra doses. What may interact with this medicine? Do not take this medicine with any of the following medications: -hymenoptera venom -sacubitril; valsartan This medicines may also interact with the following medications: -aliskiren -angiotensin receptor blockers, like losartan or valsartan -certain medicines for diabetes -diuretics -everolimus -gold compounds -lithium -NSAIDs, medicines for pain and inflammation, like ibuprofen or naproxen -potassium salts or supplements -salt substitutes -sirolimus -temsirolimus This list may not describe all possible interactions. Give your health care provider a list of all the medicines, herbs, non-prescription drugs, or dietary supplements you use. Also tell them if you smoke, drink alcohol,  or use illegal drugs. Some items may interact with your medicine. What should I watch for while using this medicine? Visit your doctor or health care professional for regular check ups. Check your blood pressure as directed. Ask your doctor what your blood pressure should be, and when you should contact him or her. Do not treat yourself for coughs, colds, or pain while you are using this medicine without asking your doctor or health care professional for advice. Some ingredients may increase your blood pressure. Women should inform their doctor if they wish to become  pregnant or think they might be pregnant. There is a potential for serious side effects to an unborn child. Talk to your health care professional or pharmacist for more information. Check with your doctor or health care professional if you get an attack of severe diarrhea, nausea and vomiting, or if you sweat a lot. The loss of too much body fluid can make it dangerous for you to take this medicine. You may get drowsy or dizzy. Do not drive, use machinery, or do anything that needs mental alertness until you know how this drug affects you. Do not stand or sit up quickly, especially if you are an older patient. This reduces the risk of dizzy or fainting spells. Alcohol can make you more drowsy and dizzy. Avoid alcoholic drinks. Avoid salt substitutes unless you are told otherwise by your doctor or health care professional. What side effects may I notice from receiving this medicine? Side effects that you should report to your doctor or health care professional as soon as possible: -allergic reactions like skin rash, itching or hives, swelling of the hands, feet, face, lips, throat, or tongue -breathing problems -signs and symptoms of kidney injury like trouble passing urine or change in the amount of urine -signs and symptoms of increased potassium like muscle weakness; chest pain; or fast, irregular heartbeat -signs and symptoms of liver injury like dark yellow or brown urine; general ill feeling or flu-like symptoms; light-colored stools; loss of appetite; nausea; right upper belly pain; unusually weak or tired; yellowing of the eyes or skin -signs and symptoms of low blood pressure like dizziness; feeling faint or lightheaded, falls; unusually weak or tired -stomach pain with or without nausea and vomiting Side effects that usually do not require medical attention (report to your doctor or health care professional if they continue or are bothersome): -changes in  taste -cough -dizziness -fever -headache -sensitivity to light This list may not describe all possible side effects. Call your doctor for medical advice about side effects. You may report side effects to FDA at 1-800-FDA-1088. Where should I keep my medicine? Keep out of the reach of children. Store at room temperature between 15 and 30 degrees C (59 and 86 degrees F). Protect from moisture. Keep container tightly closed. Throw away any unused medicine after the expiration date. NOTE: This sheet is a summary. It may not cover all possible information. If you have questions about this medicine, talk to your doctor, pharmacist, or health care provider.  2018 Elsevier/Gold Standard (2015-09-03 12:52:35)

## 2018-04-26 ENCOUNTER — Encounter (INDEPENDENT_AMBULATORY_CARE_PROVIDER_SITE_OTHER): Payer: Self-pay

## 2018-04-26 ENCOUNTER — Ambulatory Visit (INDEPENDENT_AMBULATORY_CARE_PROVIDER_SITE_OTHER): Payer: Medicare Other

## 2018-04-26 DIAGNOSIS — I110 Hypertensive heart disease with heart failure: Secondary | ICD-10-CM

## 2018-04-26 DIAGNOSIS — I5032 Chronic diastolic (congestive) heart failure: Secondary | ICD-10-CM | POA: Diagnosis not present

## 2018-04-26 DIAGNOSIS — R011 Cardiac murmur, unspecified: Secondary | ICD-10-CM

## 2018-04-26 DIAGNOSIS — I25119 Atherosclerotic heart disease of native coronary artery with unspecified angina pectoris: Secondary | ICD-10-CM | POA: Diagnosis not present

## 2018-04-26 NOTE — Progress Notes (Signed)
Complete echocardiogram has been performed.  Jimmy Cristal Qadir RDCS, RVT 

## 2018-04-27 LAB — BASIC METABOLIC PANEL
BUN / CREAT RATIO: 24 (ref 10–24)
BUN: 31 mg/dL — ABNORMAL HIGH (ref 8–27)
CHLORIDE: 99 mmol/L (ref 96–106)
CO2: 23 mmol/L (ref 20–29)
Calcium: 10 mg/dL (ref 8.6–10.2)
Creatinine, Ser: 1.31 mg/dL — ABNORMAL HIGH (ref 0.76–1.27)
GFR calc Af Amer: 59 mL/min/{1.73_m2} — ABNORMAL LOW (ref >59)
GFR calc non Af Amer: 51 mL/min/{1.73_m2} — ABNORMAL LOW (ref >59)
GLUCOSE: 143 mg/dL — AB (ref 65–99)
POTASSIUM: 4.8 mmol/L (ref 3.5–5.2)
Sodium: 141 mmol/L (ref 134–144)

## 2018-04-27 LAB — PRO B NATRIURETIC PEPTIDE: NT-PRO BNP: 981 pg/mL — AB (ref 0–486)

## 2018-04-29 DIAGNOSIS — H353231 Exudative age-related macular degeneration, bilateral, with active choroidal neovascularization: Secondary | ICD-10-CM | POA: Diagnosis not present

## 2018-04-29 DIAGNOSIS — H353211 Exudative age-related macular degeneration, right eye, with active choroidal neovascularization: Secondary | ICD-10-CM | POA: Diagnosis not present

## 2018-04-29 DIAGNOSIS — H353221 Exudative age-related macular degeneration, left eye, with active choroidal neovascularization: Secondary | ICD-10-CM | POA: Diagnosis not present

## 2018-05-07 DIAGNOSIS — Z23 Encounter for immunization: Secondary | ICD-10-CM | POA: Diagnosis not present

## 2018-05-11 DIAGNOSIS — H353221 Exudative age-related macular degeneration, left eye, with active choroidal neovascularization: Secondary | ICD-10-CM | POA: Diagnosis not present

## 2018-05-11 DIAGNOSIS — Z01818 Encounter for other preprocedural examination: Secondary | ICD-10-CM | POA: Diagnosis not present

## 2018-05-11 DIAGNOSIS — H25812 Combined forms of age-related cataract, left eye: Secondary | ICD-10-CM | POA: Diagnosis not present

## 2018-05-20 ENCOUNTER — Telehealth: Payer: Self-pay | Admitting: Cardiology

## 2018-05-20 NOTE — Telephone Encounter (Signed)
PATIENT CALLED STATING HE HAS BEEN TRYING TO GET HIS ALDACTONE FOR OVER A WEEK. PHARMACYSAID WE HAVE NOT RESPONDED BACK TO THEM  HE IS COMPLETELY OUT OF MEDICINE

## 2018-05-21 MED ORDER — SPIRONOLACTONE 25 MG PO TABS: 12.5000 mg | ORAL_TABLET | Freq: Every day | ORAL | 1 refills | Status: DC

## 2018-05-21 NOTE — Addendum Note (Signed)
Addended by: Austin Miles on: 05/21/2018 08:50 AM   Modules accepted: Orders

## 2018-05-21 NOTE — Telephone Encounter (Signed)
Refill for spironolactone to [facility] in [place] as requested.

## 2018-06-08 DIAGNOSIS — J449 Chronic obstructive pulmonary disease, unspecified: Secondary | ICD-10-CM | POA: Diagnosis not present

## 2018-06-08 DIAGNOSIS — E7800 Pure hypercholesterolemia, unspecified: Secondary | ICD-10-CM | POA: Diagnosis not present

## 2018-06-08 DIAGNOSIS — H25812 Combined forms of age-related cataract, left eye: Secondary | ICD-10-CM | POA: Diagnosis not present

## 2018-06-08 DIAGNOSIS — Z79899 Other long term (current) drug therapy: Secondary | ICD-10-CM | POA: Diagnosis not present

## 2018-06-08 DIAGNOSIS — E109 Type 1 diabetes mellitus without complications: Secondary | ICD-10-CM | POA: Diagnosis not present

## 2018-06-08 DIAGNOSIS — H353221 Exudative age-related macular degeneration, left eye, with active choroidal neovascularization: Secondary | ICD-10-CM | POA: Diagnosis not present

## 2018-06-08 DIAGNOSIS — I252 Old myocardial infarction: Secondary | ICD-10-CM | POA: Diagnosis not present

## 2018-06-08 DIAGNOSIS — Z794 Long term (current) use of insulin: Secondary | ICD-10-CM | POA: Diagnosis not present

## 2018-06-08 DIAGNOSIS — I119 Hypertensive heart disease without heart failure: Secondary | ICD-10-CM | POA: Diagnosis not present

## 2018-06-08 DIAGNOSIS — H259 Unspecified age-related cataract: Secondary | ICD-10-CM | POA: Diagnosis not present

## 2018-06-08 DIAGNOSIS — Z955 Presence of coronary angioplasty implant and graft: Secondary | ICD-10-CM | POA: Diagnosis not present

## 2018-06-08 DIAGNOSIS — Z8546 Personal history of malignant neoplasm of prostate: Secondary | ICD-10-CM | POA: Diagnosis not present

## 2018-06-08 DIAGNOSIS — E1136 Type 2 diabetes mellitus with diabetic cataract: Secondary | ICD-10-CM | POA: Diagnosis not present

## 2018-06-08 DIAGNOSIS — Z7902 Long term (current) use of antithrombotics/antiplatelets: Secondary | ICD-10-CM | POA: Diagnosis not present

## 2018-06-08 DIAGNOSIS — I050 Rheumatic mitral stenosis: Secondary | ICD-10-CM | POA: Diagnosis not present

## 2018-06-08 DIAGNOSIS — Z87891 Personal history of nicotine dependence: Secondary | ICD-10-CM | POA: Diagnosis not present

## 2018-06-15 ENCOUNTER — Telehealth: Payer: Self-pay

## 2018-06-15 NOTE — Telephone Encounter (Signed)
Take lisinopril

## 2018-06-15 NOTE — Telephone Encounter (Signed)
Sp[facility] at [facility] and notified him that patient should only be taking Lisinopril 10mg  daily and moexipril 15mg  should be discontinued.   [facility] will discontinue [facility]expril 15mg  in [facility].

## 2018-06-15 NOTE — Telephone Encounter (Signed)
[facility] called from [facility] [place] advising that patient is currently taking moexipril and lisinopril.  He would like Dr Bettina Gavia to advise which ACE the patient should be taking.    Please advise.

## 2018-06-17 DIAGNOSIS — H353231 Exudative age-related macular degeneration, bilateral, with active choroidal neovascularization: Secondary | ICD-10-CM | POA: Diagnosis not present

## 2018-07-02 DIAGNOSIS — E1122 Type 2 diabetes mellitus with diabetic chronic kidney disease: Secondary | ICD-10-CM | POA: Diagnosis not present

## 2018-07-02 DIAGNOSIS — N183 Chronic kidney disease, stage 3 (moderate): Secondary | ICD-10-CM | POA: Diagnosis not present

## 2018-07-05 ENCOUNTER — Telehealth: Payer: Self-pay

## 2018-07-05 NOTE — Telephone Encounter (Signed)
Left message for patient to return call to verify dose of metoprolol tartrate 100mg .

## 2018-07-06 MED ORDER — METOPROLOL TARTRATE 100 MG PO TABS: 100.0000 mg | ORAL_TABLET | Freq: Two times a day (BID) | ORAL | 1 refills | Status: DC

## 2018-07-06 NOTE — Telephone Encounter (Signed)
Spoke with patients wife Lelon Frohlich, and she verified that he is currently taking metoprolol tartrate 100mg  one tablet twice daily. Rx sent to pharmacy as requested.

## 2018-07-07 DIAGNOSIS — I739 Peripheral vascular disease, unspecified: Secondary | ICD-10-CM | POA: Diagnosis not present

## 2018-07-07 DIAGNOSIS — Z6825 Body mass index (BMI) 25.0-25.9, adult: Secondary | ICD-10-CM | POA: Diagnosis not present

## 2018-07-07 DIAGNOSIS — M7061 Trochanteric bursitis, right hip: Secondary | ICD-10-CM | POA: Diagnosis not present

## 2018-07-12 ENCOUNTER — Telehealth: Payer: Self-pay | Admitting: *Deleted

## 2018-07-12 MED ORDER — LISINOPRIL 10 MG PO TABS: 10.0000 mg | ORAL_TABLET | Freq: Every day | ORAL | 3 refills | Status: DC

## 2018-07-12 NOTE — Telephone Encounter (Signed)
*  STAT* If patient is at the pharmacy, call can be transferred to refill team.   1. Which medications need to be refilled? (please list name of each medication and dose if known) Lisinopril 10 mg qd  2. Which pharmacy/location (including street and city if local pharmacy) is medication to [facility] sent to?[facility] [place]  3. Do they need a 30 day or 90 day supply? [place]

## 2018-07-13 DIAGNOSIS — I739 Peripheral vascular disease, unspecified: Secondary | ICD-10-CM | POA: Diagnosis not present

## 2018-07-15 DIAGNOSIS — E1122 Type 2 diabetes mellitus with diabetic chronic kidney disease: Secondary | ICD-10-CM | POA: Diagnosis not present

## 2018-07-15 DIAGNOSIS — N183 Chronic kidney disease, stage 3 (moderate): Secondary | ICD-10-CM | POA: Diagnosis not present

## 2018-07-19 ENCOUNTER — Other Ambulatory Visit: Payer: Self-pay

## 2018-08-05 DIAGNOSIS — N183 Chronic kidney disease, stage 3 (moderate): Secondary | ICD-10-CM | POA: Diagnosis not present

## 2018-08-05 DIAGNOSIS — E782 Mixed hyperlipidemia: Secondary | ICD-10-CM | POA: Diagnosis not present

## 2018-08-05 DIAGNOSIS — H353290 Exudative age-related macular degeneration, unspecified eye, stage unspecified: Secondary | ICD-10-CM | POA: Diagnosis not present

## 2018-08-05 DIAGNOSIS — E1122 Type 2 diabetes mellitus with diabetic chronic kidney disease: Secondary | ICD-10-CM | POA: Diagnosis not present

## 2018-08-12 DIAGNOSIS — H353221 Exudative age-related macular degeneration, left eye, with active choroidal neovascularization: Secondary | ICD-10-CM | POA: Diagnosis not present

## 2018-08-12 DIAGNOSIS — H353211 Exudative age-related macular degeneration, right eye, with active choroidal neovascularization: Secondary | ICD-10-CM | POA: Diagnosis not present

## 2018-08-17 ENCOUNTER — Telehealth: Payer: Self-pay | Admitting: Cardiology

## 2018-08-17 MED ORDER — SPIRONOLACTONE 25 MG PO TABS: 12.5000 mg | ORAL_TABLET | Freq: Every day | ORAL | 0 refills | Status: DC

## 2018-08-17 NOTE — Addendum Note (Signed)
Addended by: Austin Miles on: 08/17/2018 02:23 PM   Modules accepted: Orders

## 2018-08-17 NOTE — Telephone Encounter (Signed)
Refill for spironolactone sent to [facility] as requested.

## 2018-08-17 NOTE — Telephone Encounter (Signed)
°*  STAT* If patient is at the pharmacy, call can be transferred to refill team.   1. Which medications need to be refilled? (please list name of each medication and dose if known)Spiranalactone 25mg  takes 1/2 tablet daily   2. Which pharmacy/location (including street and city if local pharmacy) is medication to be sent to?[facility]  3. Do they need a 30 day or 90 day supply? [place]

## 2018-08-23 DIAGNOSIS — Z79899 Other long term (current) drug therapy: Secondary | ICD-10-CM | POA: Diagnosis not present

## 2018-08-23 DIAGNOSIS — E1122 Type 2 diabetes mellitus with diabetic chronic kidney disease: Secondary | ICD-10-CM | POA: Diagnosis not present

## 2018-08-23 DIAGNOSIS — E782 Mixed hyperlipidemia: Secondary | ICD-10-CM | POA: Diagnosis not present

## 2018-08-23 DIAGNOSIS — Z125 Encounter for screening for malignant neoplasm of prostate: Secondary | ICD-10-CM | POA: Diagnosis not present

## 2018-08-31 ENCOUNTER — Telehealth: Payer: Self-pay | Admitting: Cardiology

## 2018-08-31 MED ORDER — METOPROLOL TARTRATE 100 MG PO TABS: 100.0000 mg | ORAL_TABLET | Freq: Two times a day (BID) | ORAL | 0 refills | Status: DC

## 2018-08-31 NOTE — Telephone Encounter (Signed)
°*  STAT* If patient is at the pharmacy, call can be transferred to refill team.   1. Which medications need to be refilled? (please list name of each medication and dose if known) Metoprolol Tartrate   2. Which pharmacy/location (including street and city if local pharmacy) is medication to be sent to?[facility]  3. Do they need a 30 day or 90 day supply? 90   Patient switching pharmacies and using [facility]

## 2018-08-31 NOTE — Telephone Encounter (Signed)
Rx sent to pharmacy as requested.

## 2018-09-02 ENCOUNTER — Ambulatory Visit (INDEPENDENT_AMBULATORY_CARE_PROVIDER_SITE_OTHER): Payer: Medicare Other | Admitting: Vascular Surgery

## 2018-09-02 ENCOUNTER — Encounter: Payer: Self-pay | Admitting: Vascular Surgery

## 2018-09-02 ENCOUNTER — Other Ambulatory Visit: Payer: Self-pay

## 2018-09-02 ENCOUNTER — Other Ambulatory Visit: Payer: Self-pay | Admitting: *Deleted

## 2018-09-02 ENCOUNTER — Encounter: Payer: Self-pay | Admitting: *Deleted

## 2018-09-02 VITALS — BP 145/61 | HR 53 | Temp 97.3°F | Resp 20 | Ht 71.0 in | Wt 175.0 lb

## 2018-09-02 DIAGNOSIS — I739 Peripheral vascular disease, unspecified: Secondary | ICD-10-CM | POA: Diagnosis not present

## 2018-09-02 NOTE — Progress Notes (Signed)
Referring Physician:  Patient name: Troy [name] MRN: [id number] DOB: [birth year] Sex: male  REASON FOR CONSULT:  HPI: Troy [name] is a 83 y.o. male with recent complaints of right hip back and thigh pain over the past few weeks.  He states that when walking his right leg to start to give way.  He does not really describe calf claudication.  He has no rest pain.  He has no nonhealing wounds.  He is on a statin and Plavix.  Chronic medical problems include coronary artery disease hypertension mild aortic stenosis.  He also has degenerative arthritis and has had bilateral knee replacements.  He has a known left carotid bruit which is followed by his cardiologist Dr. Bettina Gavia.  He states that recently his carotids were examined with ultrasound but did not have significant stenosis.  He had bilateral ABIs performed on December 2019 which were 0.8 on the right 0.9 on the left.     Past Medical History:  Diagnosis Date  . Anemia 12/09/2015  . CAD in native artery 12/09/2015   Overview:  PCI/ stent 2001  . Chronic diastolic heart failure ([place]) 12/09/2015  . Hyperlipidemia 12/09/2015  . Hypertensive heart disease with heart failure ([place]) 12/09/2015  . Necrotizing pancreatitis 06/26/2015  . Pseudocyst of pancreas 06/26/2015   Past Surgical History:  Procedure Laterality Date  . CHOLECYSTECTOMY    . TONSILLECTOMY AND ADENOIDECTOMY    . TOTAL KNEE REVISION Bilateral     Family History  Problem Relation Age of Onset  . Heart failure Mother   . Diabetes Mother   . Heart failure Father     SOCIAL HISTORY: Social History   Socioeconomic History  . Marital status: Married    Spouse name: Not on file  . Number of children: Not on file  . Years of education: Not on file  . Highest education level: Not on file  Occupational History  . Not on file  Social Needs  . Financial resource strain: Not on file  . Food insecurity:    Worry: Not on file    Inability: Not on file  . Transportation  needs:    Medical: Not on file    Non-medical: Not on file  Tobacco Use  . Smoking status: Former Research scientist (life sciences)  . Smokeless tobacco: Never Used  Substance and Sexual Activity  . Alcohol use: No  . Drug use: No  . Sexual activity: Not on file  Lifestyle  . Physical activity:    Days per week: Not on file    Minutes per session: Not on file  . Stress: Not on file  Relationships  . Social connections:    Talks on phone: Not on file    Gets together: Not on file    Attends religious service: Not on file    Active member of club or organization: Not on file    Attends meetings of clubs or organizations: Not on file    Relationship status: Not on file  . Intimate partner violence:    Fear of current or ex partner: Not on file    Emotionally abused: Not on file    Physically abused: Not on file    Forced sexual activity: Not on file  Other Topics Concern  . Not on file  Social History Narrative  . Not on file    No Known Allergies  Current Outpatient Medications  Medication Sig Dispense Refill  . amLODipine (NORVASC) 5 MG tablet Take 1 tablet (  5 mg total) by mouth daily. 30 tablet 0  . atorvastatin (LIPITOR) 80 MG tablet Take 80 mg by mouth at bedtime.     . clopidogrel (PLAVIX) 75 MG tablet Take 75 mg by mouth daily.    Marland Kitchen doxazosin (CARDURA) 2 MG tablet Take 2 mg by mouth at bedtime.     . insulin glargine (LANTUS) 100 UNIT/ML injection Inject 20 Units into the skin daily.     Marland Kitchen lisinopril (PRINIVIL,ZESTRIL) 10 MG tablet Take 1 tablet (10 mg total) by mouth daily. 30 tablet 3  . metFORMIN (GLUCOPHAGE) 500 MG tablet Take 500 mg by mouth daily.    . metoprolol tartrate (LOPRESSOR) 100 MG tablet Take 1 tablet (100 mg total) by mouth 2 (two) times daily. 180 tablet 0  . nitroGLYCERIN (NITROSTAT) 0.4 MG SL tablet Place 0.4 mg under the tongue every 5 (five) minutes as needed for chest pain.    Marland Kitchen spironolactone (ALDACTONE) 25 MG tablet Take 0.5 tablets (12.5 mg total) by mouth daily. 45  tablet 0  . Cholecalciferol (VITAMIN D3) 50 MCG (2000 UT) TABS Take 4,000 Units by mouth daily.    . Multiple Vitamins-Minerals (EYE VITAMINS PO) Take 1 tablet by mouth daily.     No current facility-administered medications for this visit.     ROS:   General:  No weight loss, Fever, chills  HEENT: No recent headaches, no nasal bleeding, no visual changes, no sore throat  Neurologic: No dizziness, blackouts, seizures. No recent symptoms of stroke or mini- stroke. No recent episodes of slurred speech, or temporary blindness.  Cardiac: No recent episodes of chest pain/pressure, no shortness of breath at rest.  No shortness of breath with exertion.  Denies history of atrial fibrillation or irregular heartbeat  Vascular: No history of rest pain in feet.  No history of claudication.  No history of non-healing ulcer, No history of DVT   Pulmonary: No home oxygen, no productive cough, no hemoptysis,  No asthma or wheezing  Musculoskeletal:  [X]  Arthritis, [ ]  Low back pain,  [X]  Joint pain  Hematologic:No history of hypercoagulable state.  No history of easy bleeding.  No history of anemia  Gastrointestinal: No hematochezia or melena,  No gastroesophageal reflux, no trouble swallowing  Urinary: [ ]  chronic Kidney disease, [ ]  on HD - [ ]  MWF or [ ]  TTHS, [ ]  Burning with urination, [ ]  Frequent urination, [ ]  Difficulty urinating;   Skin: No rashes  Psychological: No history of anxiety,  No history of depression   Physical Examination  Vitals:   09/02/18 0927  BP: (!) 145/61  Pulse: (!) 53  Resp: 20  Temp: (!) 97.3 F (36.3 C)  SpO2: 98%  Weight: 175 lb (79.4 kg)  Height: 5\' 11"  (1.803 m)    Body mass index is 24.41 kg/m.  General:  Alert and oriented, no acute distress HEENT: Normal Neck: Left side carotid bruit no JVD Pulmonary: Clear to auscultation bilaterally Cardiac: Regular Rate and Rhythm without murmur Abdomen: Soft, non-tender, non-distended, no mass Skin:  No rash Extremity Pulses:  2+ radial, brachial, absent right 2+ left femoral, absent dorsalis pedis, 2+ left posterior tibial pulses bilaterally Musculoskeletal: No deformity or edema  Neurologic: Upper and lower extremity motor 5/5 and symmetric  DATA:  ABIs as mentioned in history of present illness.  ASSESSMENT: Right leg pain possible right iliac occlusive disease   PLAN: Scheduled for aortogram bilateral lower extremity runoff possible intervention on September 10, 2018.  Risk benefits  possible complications of procedure details were discussed with the patient today.  He had apparently been previously placed on Pletal but states that this gives him diarrhea.  I told him he could stop this if it was causing him problems.  Ruta Hinds, MD [facility] Office: [phone] Pager: [phone]

## 2018-09-02 NOTE — H&P (View-Only) (Signed)
Referring Physician:  Patient name: Troy [name] MRN: [id number] DOB: 05-Jul-1936 Sex: male  REASON FOR CONSULT:  HPI: Troy [name] is a 83 y.o. male with recent complaints of right hip back and thigh pain over the past few weeks.  He states that when walking his right leg to start to give way.  He does not really describe calf claudication.  He has no rest pain.  He has no nonhealing wounds.  He is on a statin and Plavix.  Chronic medical problems include coronary artery disease hypertension mild aortic stenosis.  He also has degenerative arthritis and has had bilateral knee replacements.  He has a known left carotid bruit which is followed by his cardiologist Dr. Bettina [name].  He states that recently his carotids were examined with ultrasound but did not have significant stenosis.  He had bilateral ABIs performed on December 2019 which were 0.8 on the right 0.9 on the left.     Past Medical History:  Diagnosis Date  . Anemia 12/09/2015  . CAD in native artery 12/09/2015   Overview:  PCI/ stent 2001  . Chronic diastolic heart failure ([place]) 12/09/2015  . Hyperlipidemia 12/09/2015  . Hypertensive heart disease with heart failure ([place]) 12/09/2015  . Necrotizing pancreatitis 06/26/2015  . Pseudocyst of pancreas 06/26/2015   Past Surgical History:  Procedure Laterality Date  . CHOLECYSTECTOMY    . TONSILLECTOMY AND ADENOIDECTOMY    . TOTAL KNEE REVISION Bilateral     Family History  Problem Relation Age of Onset  . Heart failure Mother   . Diabetes Mother   . Heart failure Father     SOCIAL HISTORY: Social History   Socioeconomic History  . Marital status: Married    Spouse name: Not on file  . Number of children: Not on file  . Years of education: Not on file  . Highest education level: Not on file  Occupational History  . Not on file  Social Needs  . Financial resource strain: Not on file  . Food insecurity:    Worry: Not on file    Inability: Not on file  . Transportation  needs:    Medical: Not on file    Non-medical: Not on file  Tobacco Use  . Smoking status: Former Research scientist (life sciences)  . Smokeless tobacco: Never Used  Substance and Sexual Activity  . Alcohol use: No  . Drug use: No  . Sexual activity: Not on file  Lifestyle  . Physical activity:    Days per week: Not on file    Minutes per session: Not on file  . Stress: Not on file  Relationships  . Social connections:    Talks on phone: Not on file    Gets together: Not on file    Attends religious service: Not on file    Active member of club or organization: Not on file    Attends meetings of clubs or organizations: Not on file    Relationship status: Not on file  . Intimate partner violence:    Fear of current or ex partner: Not on file    Emotionally abused: Not on file    Physically abused: Not on file    Forced sexual activity: Not on file  Other Topics Concern  . Not on file  Social History Narrative  . Not on file    No Known Allergies  Current Outpatient Medications  Medication Sig Dispense Refill  . amLODipine (NORVASC) 5 MG tablet Take 1 tablet (  5 mg total) by mouth daily. 30 tablet 0  . atorvastatin (LIPITOR) 80 MG tablet Take 80 mg by mouth at bedtime.     . clopidogrel (PLAVIX) 75 MG tablet Take 75 mg by mouth daily.    Marland Kitchen doxazosin (CARDURA) 2 MG tablet Take 2 mg by mouth at bedtime.     . insulin glargine (LANTUS) 100 UNIT/ML injection Inject 20 Units into the skin daily.     Marland Kitchen lisinopril (PRINIVIL,ZESTRIL) 10 MG tablet Take 1 tablet (10 mg total) by mouth daily. 30 tablet 3  . metFORMIN (GLUCOPHAGE) 500 MG tablet Take 500 mg by mouth daily.    . metoprolol tartrate (LOPRESSOR) 100 MG tablet Take 1 tablet (100 mg total) by mouth 2 (two) times daily. 180 tablet 0  . nitroGLYCERIN (NITROSTAT) 0.4 MG SL tablet Place 0.4 mg under the tongue every 5 (five) minutes as needed for chest pain.    Marland Kitchen spironolactone (ALDACTONE) 25 MG tablet Take 0.5 tablets (12.5 mg total) by mouth daily. 45  tablet 0  . Cholecalciferol (VITAMIN D3) 50 MCG (2000 UT) TABS Take 4,000 Units by mouth daily.    . Multiple Vitamins-Minerals (EYE VITAMINS PO) Take 1 tablet by mouth daily.     No current facility-administered medications for this visit.     ROS:   General:  No weight loss, Fever, chills  HEENT: No recent headaches, no nasal bleeding, no visual changes, no sore throat  Neurologic: No dizziness, blackouts, seizures. No recent symptoms of stroke or mini- stroke. No recent episodes of slurred speech, or temporary blindness.  Cardiac: No recent episodes of chest pain/pressure, no shortness of breath at rest.  No shortness of breath with exertion.  Denies history of atrial fibrillation or irregular heartbeat  Vascular: No history of rest pain in feet.  No history of claudication.  No history of non-healing ulcer, No history of DVT   Pulmonary: No home oxygen, no productive cough, no hemoptysis,  No asthma or wheezing  Musculoskeletal:  [X]  Arthritis, [ ]  Low back pain,  [X]  Joint pain  Hematologic:No history of hypercoagulable state.  No history of easy bleeding.  No history of anemia  Gastrointestinal: No hematochezia or melena,  No gastroesophageal reflux, no trouble swallowing  Urinary: [ ]  chronic Kidney disease, [ ]  on HD - [ ]  MWF or [ ]  TTHS, [ ]  Burning with urination, [ ]  Frequent urination, [ ]  Difficulty urinating;   Skin: No rashes  Psychological: No history of anxiety,  No history of depression   Physical Examination  Vitals:   09/02/18 0927  BP: (!) 145/61  Pulse: (!) 53  Resp: 20  Temp: (!) 97.3 F (36.3 C)  SpO2: 98%  Weight: 175 lb (79.4 kg)  Height: 5\' 11"  (1.803 m)    Body mass index is 24.41 kg/m.  General:  Alert and oriented, no acute distress HEENT: Normal Neck: Left side carotid bruit no JVD Pulmonary: Clear to auscultation bilaterally Cardiac: Regular Rate and Rhythm without murmur Abdomen: Soft, non-tender, non-distended, no mass Skin:  No rash Extremity Pulses:  2+ radial, brachial, absent right 2+ left femoral, absent dorsalis pedis, 2+ left posterior tibial pulses bilaterally Musculoskeletal: No deformity or edema  Neurologic: Upper and lower extremity motor 5/5 and symmetric  DATA:  ABIs as mentioned in history of present illness.  ASSESSMENT: Right leg pain possible right iliac occlusive disease   PLAN: Scheduled for aortogram bilateral lower extremity runoff possible intervention on September 10, 2018.  Risk benefits  possible complications of procedure details were discussed with the patient today.  He had apparently been previously placed on Pletal but states that this gives him diarrhea.  I told him he could stop this if it was causing him problems.  Ruta Hinds, MD [facility] Office: [phone] Pager: [phone]

## 2018-09-03 ENCOUNTER — Telehealth: Payer: Self-pay | Admitting: Cardiology

## 2018-09-03 MED ORDER — METOPROLOL TARTRATE 100 MG PO TABS: 100.0000 mg | ORAL_TABLET | Freq: Two times a day (BID) | ORAL | 0 refills | Status: DC

## 2018-09-03 NOTE — Telephone Encounter (Signed)
Call metoprolol to [facility], not [facility] in [place]

## 2018-09-03 NOTE — Telephone Encounter (Signed)
Rx for metoprolol sent to [facility] as requested.

## 2018-09-06 DIAGNOSIS — E119 Type 2 diabetes mellitus without complications: Secondary | ICD-10-CM | POA: Diagnosis not present

## 2018-09-06 DIAGNOSIS — H353132 Nonexudative age-related macular degeneration, bilateral, intermediate dry stage: Secondary | ICD-10-CM | POA: Diagnosis not present

## 2018-09-06 DIAGNOSIS — Z961 Presence of intraocular lens: Secondary | ICD-10-CM | POA: Diagnosis not present

## 2018-09-10 ENCOUNTER — Encounter (HOSPITAL_COMMUNITY): Payer: Self-pay | Admitting: Vascular Surgery

## 2018-09-10 ENCOUNTER — Encounter (HOSPITAL_COMMUNITY)
Admission: RE | Disposition: A | Discharge status: Home / Self Care | Payer: Self-pay | Source: Home / Self Care | Attending: Vascular Surgery

## 2018-09-10 ENCOUNTER — Ambulatory Visit (HOSPITAL_COMMUNITY)
Admission: RE | Admit: 2018-09-10 | Discharge: 2018-09-10 | Disposition: A | Discharge status: Home / Self Care | Payer: Medicare Other | Attending: Vascular Surgery | Admitting: Vascular Surgery

## 2018-09-10 DIAGNOSIS — I70201 Unspecified atherosclerosis of native arteries of extremities, right leg: Secondary | ICD-10-CM | POA: Insufficient documentation

## 2018-09-10 DIAGNOSIS — Z7902 Long term (current) use of antithrombotics/antiplatelets: Secondary | ICD-10-CM | POA: Diagnosis not present

## 2018-09-10 DIAGNOSIS — I5032 Chronic diastolic (congestive) heart failure: Secondary | ICD-10-CM | POA: Insufficient documentation

## 2018-09-10 DIAGNOSIS — Z955 Presence of coronary angioplasty implant and graft: Secondary | ICD-10-CM | POA: Insufficient documentation

## 2018-09-10 DIAGNOSIS — E785 Hyperlipidemia, unspecified: Secondary | ICD-10-CM | POA: Insufficient documentation

## 2018-09-10 DIAGNOSIS — Z794 Long term (current) use of insulin: Secondary | ICD-10-CM | POA: Diagnosis not present

## 2018-09-10 DIAGNOSIS — Z79899 Other long term (current) drug therapy: Secondary | ICD-10-CM | POA: Diagnosis not present

## 2018-09-10 DIAGNOSIS — Z87891 Personal history of nicotine dependence: Secondary | ICD-10-CM | POA: Diagnosis not present

## 2018-09-10 DIAGNOSIS — Z96653 Presence of artificial knee joint, bilateral: Secondary | ICD-10-CM | POA: Insufficient documentation

## 2018-09-10 DIAGNOSIS — M79604 Pain in right leg: Secondary | ICD-10-CM | POA: Insufficient documentation

## 2018-09-10 DIAGNOSIS — I70213 Atherosclerosis of native arteries of extremities with intermittent claudication, bilateral legs: Secondary | ICD-10-CM | POA: Diagnosis not present

## 2018-09-10 DIAGNOSIS — I110 Hypertensive heart disease with heart failure: Secondary | ICD-10-CM | POA: Insufficient documentation

## 2018-09-10 DIAGNOSIS — I350 Nonrheumatic aortic (valve) stenosis: Secondary | ICD-10-CM | POA: Diagnosis not present

## 2018-09-10 DIAGNOSIS — I2510 Atherosclerotic heart disease of native coronary artery without angina pectoris: Secondary | ICD-10-CM | POA: Diagnosis not present

## 2018-09-10 DIAGNOSIS — M170 Bilateral primary osteoarthritis of knee: Secondary | ICD-10-CM | POA: Diagnosis not present

## 2018-09-10 DIAGNOSIS — Z8249 Family history of ischemic heart disease and other diseases of the circulatory system: Secondary | ICD-10-CM | POA: Insufficient documentation

## 2018-09-10 HISTORY — PX: ABDOMINAL AORTOGRAM W/LOWER EXTREMITY: CATH118223

## 2018-09-10 HISTORY — PX: PERIPHERAL VASCULAR INTERVENTION: CATH118257

## 2018-09-10 LAB — POCT I-STAT CREATININE: Creatinine, Ser: 1.4 mg/dL — ABNORMAL HIGH (ref 0.61–1.24)

## 2018-09-10 LAB — GLUCOSE, CAPILLARY: Glucose-Capillary: 94 mg/dL (ref 70–99)

## 2018-09-10 LAB — POCT I-STAT 4, (NA,K, GLUC, HGB,HCT)
Glucose, Bld: 131 mg/dL — ABNORMAL HIGH (ref 70–99)
HCT: 30 % — ABNORMAL LOW (ref 39.0–52.0)
Hemoglobin: 10.2 g/dL — ABNORMAL LOW (ref 13.0–17.0)
Potassium: 3.9 mmol/L (ref 3.5–5.1)
Sodium: 141 mmol/L (ref 135–145)

## 2018-09-10 LAB — POCT ACTIVATED CLOTTING TIME: Activated Clotting Time: 169 seconds

## 2018-09-10 SURGERY — ABDOMINAL AORTOGRAM W/LOWER EXTREMITY
Anesthesia: LOCAL | Laterality: Right

## 2018-09-10 MED ORDER — ACETAMINOPHEN 325 MG PO TABS: 650.0000 mg | ORAL_TABLET | ORAL | Status: DC | PRN

## 2018-09-10 MED ORDER — IODIXANOL 320 MG/ML IV SOLN
INTRAVENOUS | Status: DC | PRN
  Administered 2018-09-10: 160 mL via INTRA_ARTERIAL

## 2018-09-10 MED ORDER — SODIUM CHLORIDE 0.9% FLUSH: 3.0000 mL | INTRAVENOUS | Status: DC | PRN

## 2018-09-10 MED ORDER — LIDOCAINE HCL (PF) 1 % IJ SOLN
INTRAMUSCULAR | Status: AC
  Filled 2018-09-10: qty 30

## 2018-09-10 MED ORDER — SODIUM CHLORIDE 0.9 % IV SOLN: INTRAVENOUS | Status: AC

## 2018-09-10 MED ORDER — ONDANSETRON HCL 4 MG/2ML IJ SOLN: 4.0000 mg | Freq: Four times a day (QID) | INTRAMUSCULAR | Status: DC | PRN

## 2018-09-10 MED ORDER — HEPARIN SODIUM (PORCINE) 1000 UNIT/ML IJ SOLN
INTRAMUSCULAR | Status: AC
  Filled 2018-09-10: qty 1

## 2018-09-10 MED ORDER — HEPARIN SODIUM (PORCINE) 1000 UNIT/ML IJ SOLN
INTRAMUSCULAR | Status: DC | PRN
  Administered 2018-09-10: 5000 [IU] via INTRAVENOUS

## 2018-09-10 MED ORDER — HEPARIN (PORCINE) IN NACL 1000-0.9 UT/500ML-% IV SOLN
INTRAVENOUS | Status: AC
  Filled 2018-09-10: qty 1000

## 2018-09-10 MED ORDER — SODIUM CHLORIDE 0.9% FLUSH: 3.0000 mL | Freq: Two times a day (BID) | INTRAVENOUS | Status: DC

## 2018-09-10 MED ORDER — HEPARIN (PORCINE) IN NACL 1000-0.9 UT/500ML-% IV SOLN
INTRAVENOUS | Status: DC | PRN
  Administered 2018-09-10 (×2): 500 mL

## 2018-09-10 MED ORDER — LIDOCAINE HCL (PF) 1 % IJ SOLN
INTRAMUSCULAR | Status: DC | PRN
  Administered 2018-09-10: 15 mL via INTRADERMAL

## 2018-09-10 MED ORDER — SODIUM CHLORIDE 0.9 % IV SOLN: 250.0000 mL | INTRAVENOUS | Status: DC | PRN

## 2018-09-10 MED ORDER — SODIUM CHLORIDE 0.9 % IV SOLN
INTRAVENOUS | Status: DC
  Administered 2018-09-10: 06:00:00 via INTRAVENOUS

## 2018-09-10 MED ORDER — MORPHINE SULFATE (PF) 10 MG/ML IV SOLN: 2.0000 mg | INTRAVENOUS | Status: DC | PRN

## 2018-09-10 MED ORDER — OXYCODONE HCL 5 MG PO TABS: 5.0000 mg | ORAL_TABLET | ORAL | Status: DC | PRN

## 2018-09-10 MED ORDER — LABETALOL HCL 5 MG/ML IV SOLN: 10.0000 mg | INTRAVENOUS | Status: DC | PRN

## 2018-09-10 MED ORDER — HYDRALAZINE HCL 20 MG/ML IJ SOLN
5.0000 mg | INTRAMUSCULAR | Status: DC | PRN
  Administered 2018-09-10: 5 mg via INTRAVENOUS

## 2018-09-10 MED ORDER — HYDRALAZINE HCL 20 MG/ML IJ SOLN
INTRAMUSCULAR | Status: AC
  Filled 2018-09-10: qty 1

## 2018-09-10 SURGICAL SUPPLY — 22 items
BAG SNAP BAND KOVER 36X36 (MISCELLANEOUS) ×1 IMPLANT
BALLN MUSTANG 6.0X20 75 (BALLOONS) ×3
BALLN MUSTANG 8X20X75 (BALLOONS) ×3
BALLOON MUSTANG 6.0X20 75 (BALLOONS) IMPLANT
BALLOON MUSTANG 8X20X75 (BALLOONS) IMPLANT
CATH ANGIO 5F BER2 65CM (CATHETERS) ×1 IMPLANT
CATH ANGIO 5F PIGTAIL 65CM (CATHETERS) ×1 IMPLANT
COVER DOME SNAP 22 D (MISCELLANEOUS) ×1 IMPLANT
KIT ENCORE 26 ADVANTAGE (KITS) ×1 IMPLANT
KIT PV (KITS) ×3 IMPLANT
SHEATH BRITE TIP 7FR 35CM (SHEATH) ×1 IMPLANT
SHEATH BRITE TIP 8FR 35CM (SHEATH) ×1 IMPLANT
SHEATH PINNACLE 5F 10CM (SHEATH) ×1 IMPLANT
SHEATH PINNACLE R/O II 5F 6CM (SHEATH) IMPLANT
SHEATH PROBE COVER 6X72 (BAG) ×1 IMPLANT
STENT VIABAHNBX 11X29X135 (Permanent Stent) ×1 IMPLANT
SYR MEDRAD MARK V 150ML (SYRINGE) ×1 IMPLANT
TRANSDUCER W/STOPCOCK (MISCELLANEOUS) ×3 IMPLANT
TRAY PV CATH (CUSTOM PROCEDURE TRAY) ×3 IMPLANT
WIRE AMPLATZ SS-J .035X180CM (WIRE) ×1 IMPLANT
WIRE AMPLATZ SS-J .035X260CM (WIRE) ×1 IMPLANT
WIRE HITORQ VERSACORE ST 145CM (WIRE) ×1 IMPLANT

## 2018-09-10 NOTE — Discharge Instructions (Signed)
Hold Metformin for 48 hours  Femoral Site Care This sheet gives you information about how to care for yourself after your procedure. Your health care provider may also give you more specific instructions. If you have problems or questions, contact your health care provider. What can I expect after the procedure? After the procedure, it is common to have:  Bruising that usually fades within 1-2 weeks.  Tenderness at the site. Follow these instructions at home: Wound care  Follow instructions from your health care provider about how to take care of your insertion site. Make sure you: ? Wash your hands with soap and water before you change your bandage (dressing). If soap and water are not available, use hand sanitizer. ? Remove your dressing as told by your health care provider. In 24-48 hours  Do not take baths, swim, or use a hot tub until your health care provider approves.  You may shower 24-48 hours after the procedure or as told by your health care provider. ? Gently wash the site with plain soap and water. ? Pat the area dry with a clean towel. ? Do not rub the site. This may cause bleeding.  Do not apply powder or lotion to the site. Keep the site clean and dry.  Check your femoral site every day for signs of infection. Check for: ? Redness, swelling, or pain. ? Fluid or blood. ? Warmth. ? Pus or a bad smell. Activity  For the first 2-3 days after your procedure, or as long as directed: ? Avoid climbing stairs as much as possible. ? Do not squat.  Do not lift anything that is heavier than 10 lb (4.5 kg), or the limit that you are told, until your health care provider says that it is safe. For 5 days  Rest as directed. ? Avoid sitting for a long time without moving. Get up to take short walks every 1-2 hours.  Do not drive for 24 hours if you were given a medicine to help you relax (sedative). General instructions  Take over-the-counter and prescription medicines  only as told by your health care provider.  Keep all follow-up visits as told by your health care provider. This is important. Contact a health care provider if you have:  A fever or chills.  You have redness, swelling, or pain around your insertion site. Get help right away if:  The catheter insertion area swells very fast.  You pass out.  You suddenly start to sweat or your skin gets clammy.  The catheter insertion area is bleeding, and the bleeding does not stop when you hold steady pressure on the area.  The area near or just beyond the catheter insertion site becomes pale, cool, tingly, or numb. These symptoms may represent a serious problem that is an emergency. Do not wait to see if the symptoms will go away. Get medical help right away. Call your local emergency services (911 in the [place] Do not drive yourself to the hospital. Summary  After the procedure, it is common to have bruising that usually fades within 1-2 weeks.  Check your femoral site every day for signs of infection.  Do not lift anything that is heavier than 10 lb (4.5 kg), or the limit that you are told, until your health care provider says that it is safe. This information is not intended to replace advice given to you by your health care provider. Make sure you discuss any questions you have with your health care provider. Document  Released: 03/17/2014 Document Revised: 07/27/2017 Document Reviewed: 07/27/2017 Elsevier Interactive Patient Education  2019 [employer].

## 2018-09-10 NOTE — Op Note (Signed)
Procedure: Abdominal aortogram with bilateral lower extremity runoff, ultrasound right common femoral artery, right common iliac stent (11 x 29 VBX)  Preoperative diagnosis: Claudication  Postoperative diagnosis: Same  Anesthesia: Local  Operative findings:   #1  80% stenosis right common iliac artery stented to 0% residual stenosis  #2 right femoral 8 French sheath  3.  Bilateral two-vessel runoff  Operative details: After pain informed consent, the patient was taken the PV lab.  The patient was placed in supine position the angios table.  Both groins were prepped and draped in usual sterile fashion.  Ultrasound was used to identify the right common femoral artery and femoral bifurcation.  Local anesthesia was infiltrated over the right common femoral artery.  Introducer needle was used to cannulate the right common femoral artery and an 035 versa core wire threaded up into the iliac system under fluoroscopic guidance.  Next a 5 French sheath was placed over the guidewire into the right common femoral artery.  A 5 [place] Berenstein catheter was then used to direct the wire up into the abdominal aorta.  A 5 French pigtail catheter was then placed over the guidewire in the abdominal aorta.  Abdominal aortogram was obtained in AP projection.  The left and right renal arteries are patent.  The infrarenal abdominal aorta is patent.  There is moderate to severe calcification of the aortoiliac system.  There is a 80% stenosis of a calcified exophytic stenosis in the right common iliac artery.  The left common iliac artery is narrowed by about 25%.  The left and right internal iliac arteries are small but patent.  The left and right external iliac arteries are patent bilaterally.  Next the pigtail catheter was pulled out above the aortic bifurcation and bilateral oblique views of the pelvis were performed to confirm the above findings.  At this point bilateral extremity runoff views were performed through  the pigtail catheter.  In the left lower extremity, the left common femoral profunda femoris and superficial femoral arteries are patent.  There is a 70% mid stenosis of the left superficial femoral artery.  The popliteal artery is patent.  There is two-vessel runoff to the left foot.  In the right lower extremity the right common femoral profunda femoris is patent.  The right superficial femoral and popliteal arteries are patent.  There is two-vessel runoff to the right foot.  At this point it was decided to intervene on the right common iliac stenosis.  The patient was given 5000 units of intravenous heparin.  The 5 French sheath was swapped out for an 8 [place] bright tip sheath.  This was advanced up to the lesion.  I could not get this to cross the lesion.  Therefore we predilated the lesion first with a 6 x 4 mm balloon followed by an 8 x 4 mm balloon and angioplastied to nominal pressure for 30 seconds.  At this point I was able to advance the sheath across the lesion and a 11 x 29 VBX stent was brought up and centered on the lesion and the sheath pulled back.  This was then deployed at nominal pressure for 1 minute.  Completion angiogram showed wide patency of the right common iliac artery with preservation of the left common iliac no evidence of dissection.  The sheath was pulled down into the right hemipelvis.  The patient tolerated procedure well and there were no complications.  The patient was taken to the holding area in stable condition.  Ruta Hinds,  MD [facility] Office: [phone] Pager: [phone]

## 2018-09-10 NOTE — Progress Notes (Signed)
Site area: Right groin a 8 french long artery sheath was removed  Site Prior to Removal:  Level 0  Pressure Applied For 20 MINUTES    Bedrest Beginning at 0920am  Manual:   Yes.    Patient Status During Pull:  stable  Post Pull Groin Site:  Level 0  Post Pull Instructions Given:  Yes.    Post Pull Pulses Present:  Yes.    Dressing Applied:  Yes.    Comments:  BP meds effective

## 2018-09-10 NOTE — Interval H&P Note (Signed)
History and Physical Interval Note:  09/10/2018 7:31 AM  Troy [name]  has presented today for surgery, with the diagnosis of PVD  The various methods of treatment have been discussed with the patient and family. After consideration of risks, benefits and other options for treatment, the patient has consented to  Procedure(s): ABDOMINAL AORTOGRAM W/LOWER EXTREMITY (Bilateral) as a surgical intervention .  The patient's history has been reviewed, patient examined, no change in status, stable for surgery.  I have reviewed the patient's chart and labs.  Questions were answered to the patient's satisfaction.     Ruta Hinds

## 2018-09-14 ENCOUNTER — Telehealth: Payer: Self-pay | Admitting: Vascular Surgery

## 2018-09-14 NOTE — Telephone Encounter (Signed)
-----   Message from Elam Dutch, MD sent at 09/10/2018  8:44 AM EST ----- Procedure: Abdominal aortogram with bilateral lower extremity runoff, ultrasound right common femoral artery, right common iliac stent (11 x 29 VBX)  Pt needs follow up in [place] [facility] in 2-6 weeks with bilateral ABIs and aortoiliac duplex  Ruta Hinds, MD [facility] [place]: [phone] Pager: [phone]

## 2018-09-14 NOTE — Telephone Encounter (Signed)
sch appt spk to pt wife mld ltr 10/21/2018 8am Aorto 9am ABI 915am p/o NP

## 2018-09-17 ENCOUNTER — Encounter: Payer: Self-pay | Admitting: Family Medicine

## 2018-09-23 ENCOUNTER — Encounter: Payer: Self-pay | Admitting: Family Medicine

## 2018-10-04 ENCOUNTER — Other Ambulatory Visit: Payer: Self-pay | Admitting: Cardiology

## 2018-10-07 DIAGNOSIS — H353221 Exudative age-related macular degeneration, left eye, with active choroidal neovascularization: Secondary | ICD-10-CM | POA: Diagnosis not present

## 2018-10-07 DIAGNOSIS — H353211 Exudative age-related macular degeneration, right eye, with active choroidal neovascularization: Secondary | ICD-10-CM | POA: Diagnosis not present

## 2018-10-09 ENCOUNTER — Other Ambulatory Visit: Payer: Self-pay | Admitting: Cardiology

## 2018-10-12 MED ORDER — LISINOPRIL 10 MG PO TABS: 10.0000 mg | ORAL_TABLET | Freq: Every day | ORAL | 1 refills | Status: DC

## 2018-10-12 NOTE — Addendum Note (Signed)
Addended by: Aleatha Borer on: 10/12/2018 01:25 PM   Modules accepted: Orders

## 2018-10-21 ENCOUNTER — Encounter: Payer: Medicare Other | Admitting: Family

## 2018-10-21 ENCOUNTER — Telehealth: Payer: Self-pay

## 2018-10-21 ENCOUNTER — Encounter (HOSPITAL_COMMUNITY): Payer: Medicare Other

## 2018-10-21 NOTE — Telephone Encounter (Signed)
   Primary Cardiologist: [facility]  Patient contacted.  History reviewed.  No symptoms to suggest any unstable cardiac conditions.  Based on discussion, with current pandemic situation, we will be postponing this appointment for Troy [name] with a plan for f/u in 6-12 wks or sooner if feasible/necessary.  If symptoms change, he has been instructed to contact our office.    Stevan Born, CMA  10/21/2018 8:53 AM         .

## 2018-10-22 ENCOUNTER — Ambulatory Visit: Payer: Medicare Other | Admitting: Cardiology

## 2018-10-29 ENCOUNTER — Other Ambulatory Visit: Payer: Self-pay | Admitting: Cardiology

## 2018-11-01 ENCOUNTER — Other Ambulatory Visit: Payer: Self-pay | Admitting: Cardiology

## 2018-11-02 ENCOUNTER — Other Ambulatory Visit: Payer: Self-pay | Admitting: Cardiology

## 2018-11-09 ENCOUNTER — Other Ambulatory Visit: Payer: Self-pay | Admitting: Cardiology

## 2018-12-28 ENCOUNTER — Telehealth: Payer: Self-pay | Admitting: *Deleted

## 2018-12-28 NOTE — Telephone Encounter (Signed)
YOUR CARDIOLOGY TEAM HAS ARRANGED FOR AN E-VISIT FOR YOUR APPOINTMENT - PLEASE REVIEW IMPORTANT INFORMATION BELOW SEVERAL DAYS PRIOR TO YOUR APPOINTMENT  Due to the recent COVID-19 pandemic, we are transitioning in-person office visits to tele-medicine visits in an effort to decrease unnecessary exposure to our patients, their families, and staff. These visits are billed to your insurance just like a normal visit is. We also encourage you to sign up for MyChart if you have not already done so. You will need a smartphone if possible. For patients that do not have this, we can still complete the visit using a regular telephone but do prefer a smartphone to enable video when possible. You may have a family member that lives with you that can help. If possible, we also ask that you have a blood pressure cuff and scale at home to measure your blood pressure, heart rate and weight prior to your scheduled appointment. Patients with clinical needs that need an in-person evaluation and testing will still be able to come to the office if absolutely necessary. If you have any questions, feel free to call our office.     YOUR PROVIDER WILL BE USING THE FOLLOWING PLATFORM TO COMPLETE YOUR VISIT: Staff: Please delete this text and fill in [email or web address]  . IF USING MYCHART - How to Download the MyChart App to Your SmartPhone   - If Apple, go to App Store and type in MyChart in the search bar and download the app. If Android, ask patient to go to Google Play Store and type in MyChart in the search bar and download the app. The app is free but as with any other app downloads, your phone may require you to verify saved payment information or Apple/Android password.  - You will need to then log into the app with your MyChart username and password, and select [facility] as your healthcare provider to link the account.  - When it is time for your visit, go to the MyChart app, find appointments, and click Begin  Video Visit. Be sure to Select Allow for your device to access the Microphone and Camera for your visit. You will then be connected, and your provider will be with you shortly.  **If you have any issues connecting or need assistance, please contact MyChart service desk [phone] ([phone])**  **If using a computer, in order to ensure the best quality for your visit, you will need to use either of the following Internet Browsers: Google Chrome or Microsoft Edge**  . IF USING DOXIMITY or [email or web address] - The staff will give you instructions on receiving your link to join the meeting the day of your visit.      2-3 DAYS BEFORE YOUR APPOINTMENT  You will receive a telephone call from one of our [facility] team members - your caller ID may say "Unknown caller." If this is a video visit, we will walk you through how to get the video launched on your phone. We will remind you check your blood pressure, heart rate and weight prior to your scheduled appointment. If you have an Apple Watch or Kardia, please upload any pertinent ECG strips the day before or morning of your appointment to MyChart. Our staff will also make sure you have reviewed the consent and agree to move forward with your scheduled tele-health visit.     THE DAY OF YOUR APPOINTMENT  Approximately 15 minutes prior to your scheduled appointment, you will receive a telephone call from one of [facility] team -   your caller ID may say "Unknown caller."  Our staff will confirm medications, vital signs for the day and any symptoms you may be experiencing. Please have this information available prior to the time of visit start. It may also be helpful for you to have a pad of paper and pen handy for any instructions given during your visit. They will also walk you through joining the smartphone meeting if this is a video visit.    CONSENT FOR TELE-HEALTH VISIT - PLEASE REVIEW  I hereby voluntarily request, consent and authorize [facility] and  its employed or contracted physicians, physician assistants, nurse practitioners or other licensed health care professionals (the Practitioner), to provide me with telemedicine health care services (the "Services") as deemed necessary by the treating Practitioner. I acknowledge and consent to receive the Services by the Practitioner via telemedicine. I understand that the telemedicine visit will involve communicating with the Practitioner through live audiovisual communication technology and the disclosure of certain medical information by electronic transmission. I acknowledge that I have been given the opportunity to request an in-person assessment or other available alternative prior to the telemedicine visit and am voluntarily participating in the telemedicine visit.  I understand that I have the right to withhold or withdraw my consent to the use of telemedicine in the course of my care at any time, without affecting my right to future care or treatment, and that the Practitioner or I may terminate the telemedicine visit at any time. I understand that I have the right to inspect all information obtained and/or recorded in the course of the telemedicine visit and may receive copies of available information for a reasonable fee.  I understand that some of the potential risks of receiving the Services via telemedicine include:  Marland Kitchen Delay or interruption in medical evaluation due to technological equipment failure or disruption; . Information transmitted may not be sufficient (e.g. poor resolution of images) to allow for appropriate medical decision making by the Practitioner; and/or  . In rare instances, security protocols could fail, causing a breach of personal health information.  Furthermore, I acknowledge that it is my responsibility to provide information about my medical history, conditions and care that is complete and accurate to the best of my ability. I acknowledge that Practitioner's advice,  recommendations, and/or decision may be based on factors not within their control, such as incomplete or inaccurate data provided by me or distortions of diagnostic images or specimens that may result from electronic transmissions. I understand that the practice of medicine is not an exact science and that Practitioner makes no warranties or guarantees regarding treatment outcomes. I acknowledge that I will receive a copy of this consent concurrently upon execution via email to the email address I last provided but may also request a printed copy by calling the office of [place].    I understand that my insurance will be billed for this visit.   I have read or had this consent read to me. . I understand the contents of this consent, which adequately explains the benefits and risks of the Services being provided via telemedicine.  . I have been provided ample opportunity to ask questions regarding this consent and the Services and have had my questions answered to my satisfaction. . I give my informed consent for the services to be provided through the use of telemedicine in my medical care  By participating in this telemedicine visit I agree to the above.Pt gives consent to virtual visit.

## 2018-12-29 DIAGNOSIS — H43813 Vitreous degeneration, bilateral: Secondary | ICD-10-CM | POA: Diagnosis not present

## 2018-12-29 DIAGNOSIS — H353231 Exudative age-related macular degeneration, bilateral, with active choroidal neovascularization: Secondary | ICD-10-CM | POA: Diagnosis not present

## 2019-01-06 DIAGNOSIS — H353231 Exudative age-related macular degeneration, bilateral, with active choroidal neovascularization: Secondary | ICD-10-CM | POA: Diagnosis not present

## 2019-01-17 ENCOUNTER — Other Ambulatory Visit: Payer: Self-pay | Admitting: Cardiology

## 2019-02-04 DIAGNOSIS — H353231 Exudative age-related macular degeneration, bilateral, with active choroidal neovascularization: Secondary | ICD-10-CM | POA: Diagnosis not present

## 2019-02-23 DIAGNOSIS — E118 Type 2 diabetes mellitus with unspecified complications: Secondary | ICD-10-CM | POA: Diagnosis not present

## 2019-02-28 ENCOUNTER — Other Ambulatory Visit: Payer: Self-pay | Admitting: Cardiology

## 2019-03-01 ENCOUNTER — Telehealth: Payer: Medicare Other | Admitting: Cardiology

## 2019-03-02 DIAGNOSIS — H353290 Exudative age-related macular degeneration, unspecified eye, stage unspecified: Secondary | ICD-10-CM | POA: Diagnosis not present

## 2019-03-02 DIAGNOSIS — E1122 Type 2 diabetes mellitus with diabetic chronic kidney disease: Secondary | ICD-10-CM | POA: Diagnosis not present

## 2019-03-02 DIAGNOSIS — E782 Mixed hyperlipidemia: Secondary | ICD-10-CM | POA: Diagnosis not present

## 2019-03-02 DIAGNOSIS — N183 Chronic kidney disease, stage 3 (moderate): Secondary | ICD-10-CM | POA: Diagnosis not present

## 2019-03-04 DIAGNOSIS — H353231 Exudative age-related macular degeneration, bilateral, with active choroidal neovascularization: Secondary | ICD-10-CM | POA: Diagnosis not present

## 2019-03-07 NOTE — Progress Notes (Signed)
Virtual Visit via Video Note   This visit type was conducted due to national recommendations for restrictions regarding the COVID-19 Pandemic (e.g. social distancing) in an effort to limit this patient's exposure and mitigate transmission in our community.  Due to his co-morbid illnesses, this patient is at least at moderate risk for complications without adequate follow up.  This format is felt to be most appropriate for this patient at this time.  All issues noted in this document were discussed and addressed.  A limited physical exam was performed with this format.  Please refer to the patient's chart for his consent to telehealth for [facility].   Date:  03/08/2019   ID:  Troy [name], DOB 08-Jan-1936, MRN [id number]  Patient Location: Home Provider Location: Office  PCP:  Myer Peer, MD  Cardiologist:  No primary care provider on file.  Electrophysiologist:  None   Evaluation Performed:  Follow-Up Visit  Chief Complaint:  83 yo male with PMH CAD, CHF, DLD, HTN, AS presents for follow up of chronic cardiac conditions.   History of Present Illness:    Troy [name] is a 83 y.o. male with hx of  CAD, CHF, Dyslipidemia, HTN, AS, S/P PCI in 2001 and anemia  last seen 04/21/2018. Echo 04/26/18 shows EF 55-60%, gr1DD, mild to mod MR, mild AS peak/mean gradient 10/12 mmHg.   Underwent 09/10/18 to [facility] for abdominal aortogram with bilateral lower extremity runoff by Dr. Oneida Alar for claudication. He had a stent placed in R common iliac artery.   The patient does not have symptoms concerning for COVID-19 infection (fever, chills, cough, or new shortness of breath).   He is remarkably improved after PCI and now walks a mile on the treadmill and fortunately has not had trouble with angina shortness of breath edema orthopnea.  At times his wife are retired Therapist, sports gets rates in the low 50s and he has lightheadedness from this point on a hold beta-blocker with heart rates of 53 or less.   He is up-to-date on labs his cardiac disease is stable and I will see in the office face-to-face 6 months   Past Medical History:  Diagnosis Date  . Anemia 12/09/2015  . CAD in native artery 12/09/2015   Overview:  PCI/ stent 2001  . Chronic diastolic heart failure ([place]) 12/09/2015  . Hyperlipidemia 12/09/2015  . Hypertensive heart disease with heart failure ([place]) 12/09/2015  . Necrotizing pancreatitis 06/26/2015  . Pseudocyst of pancreas 06/26/2015   Past Surgical History:  Procedure Laterality Date  . ABDOMINAL AORTOGRAM W/LOWER EXTREMITY Bilateral 09/10/2018   Procedure: ABDOMINAL AORTOGRAM W/LOWER EXTREMITY;  Surgeon: Elam Dutch, MD;  Location: [place] [facility];  Service: Cardiovascular;  Laterality: Bilateral;  . CHOLECYSTECTOMY    . PERIPHERAL VASCULAR INTERVENTION Right 09/10/2018   Procedure: PERIPHERAL VASCULAR INTERVENTION;  Surgeon: Elam Dutch, MD;  Location: [place] [facility];  Service: Cardiovascular;  Laterality: Right;  common iliac  . TONSILLECTOMY AND ADENOIDECTOMY    . TOTAL KNEE REVISION Bilateral      Current Meds  Medication Sig  . amLODipine (NORVASC) 5 MG tablet Take 1 tablet (5 mg total) by mouth daily.  Marland Kitchen atorvastatin (LIPITOR) 80 MG tablet Take 80 mg by mouth at bedtime.   . Cholecalciferol (VITAMIN D3) 50 MCG (2000 UT) TABS Take 4,000 Units by mouth daily.  . clopidogrel (PLAVIX) 75 MG tablet Take 75 mg by mouth daily.  Marland Kitchen doxazosin (CARDURA) 2 MG tablet Take 2  mg by mouth at bedtime.   . insulin glargine (LANTUS) 100 UNIT/ML injection Inject 25 Units into the skin daily.   Marland Kitchen lisinopril (ZESTRIL) 10 MG tablet TAKE 1 TABLET BY MOUTH EVERY DAY  . metFORMIN (GLUCOPHAGE) 500 MG tablet Take 500 mg by mouth 2 (two) times daily with a meal.   . metoprolol tartrate (LOPRESSOR) 100 MG tablet TAKE ONE TABLET BY MOUTH TWICE A DAY  . Multiple Vitamins-Minerals (EYE VITAMINS PO) Take 1 tablet by mouth daily.  . nitroGLYCERIN (NITROSTAT) 0.4 MG SL tablet  Place 0.4 mg under the tongue every 5 (five) minutes as needed for chest pain.  Marland Kitchen spironolactone (ALDACTONE) 25 MG tablet TAKE 1/2 TABLET BY MOUTH EVERY DAY     Allergies:   Patient has no known allergies.   Social History   Tobacco Use  . Smoking status: Former Smoker    Types: Cigarettes  . Smokeless tobacco: Never Used  Substance Use Topics  . Alcohol use: No  . Drug use: No     Family Hx: The patient's family history includes Diabetes in his mother; Heart failure in his father and mother.  ROS:   Please see the history of present illness.     All other systems reviewed and are negative.   Prior CV studies:   The following studies were reviewed today:  Labs/Other Tests and Data Reviewed:    EKG:  An ECG dated 04/22/2019 was personally reviewed today and demonstrated:  Sinus bradycardia 46 bpm otherwise normal  Recent Labs: 04/26/2018: BUN 31; NT-Pro BNP 981 09/10/2018: Creatinine, Ser 1.40; Hemoglobin 10.2; Potassium 3.9; Sodium 141   Recent Lipid Panel Lipid panel 02/23/19 with total cholesterol 115, LDL 64, HDL 29, triglycerides 1111.  Wt Readings from Last 3 Encounters:  03/08/19 173 lb (78.5 kg)  09/10/18 170 lb (77.1 kg)  09/02/18 175 lb (79.4 kg)     Objective:    Vital Signs:  BP 125/61 (BP Location: Left Arm, Patient Position: Sitting, Cuff Size: Normal)   Pulse (!) 54   Wt 173 lb (78.5 kg)   BMI 24.13 kg/m    VITAL SIGNS:  reviewed   Despite coaching and 2 software platforms we could never obtain a video link transitioned to a telephone visit  ASSESSMENT & PLAN:    1. CAD - Continue GDMT aspirin, statin, beta blocker.  Hold for rates of 53 or less.  Currently New York Heart Association class I 2. Chronic diastolic HF -compensated continue his current diuretic 3. Hypertensive heart disease with HF -BP at target continue current treatment 4. HLD - 729/20 HDL 29, LDL 64. Continue present statin. At goal of HDL <70.  5. AS - Echo 03/2018 with mild  AS.  Consider repeat echocardiogram next visit 6. MR - Echo 03/2018 with mild to mod MR. see above 7. Claudication - Follows with Dr. Oneida Alar of [facility]. Markedly improved after PCI walks 1 mile a day on his treadmill 8. DM2 - Follows with his PCP. A1c 7.3 on 02/23/19. If additional agent needed would recommend SGLT2 inhibitor.   COVID-19 Education: The signs and symptoms of COVID-19 were discussed with the patient and how to seek care for testing (follow up with PCP or arrange E-visit).  The importance of social distancing was discussed today.  Time:   Today, I have spent 16 minutes with the patient with telehealth technology discussing the above problems.     Medication Adjustments/Labs and Tests Ordered: Current medicines are reviewed at length with the  patient today.  Concerns regarding medicines are outlined above.   Tests Ordered: No orders of the defined types were placed in this encounter.   Medication Changes: No orders of the defined types were placed in this encounter.   Follow Up:  In Person in 6 month(s)  Signed, Shirlee More, MD  03/08/2019 10:18 AM    [place]

## 2019-03-08 ENCOUNTER — Encounter: Payer: Self-pay | Admitting: Cardiology

## 2019-03-08 ENCOUNTER — Other Ambulatory Visit: Payer: Self-pay

## 2019-03-08 ENCOUNTER — Telehealth (INDEPENDENT_AMBULATORY_CARE_PROVIDER_SITE_OTHER): Payer: Medicare Other | Admitting: Cardiology

## 2019-03-08 VITALS — BP 125/61 | HR 54 | Wt 173.0 lb

## 2019-03-08 DIAGNOSIS — I5032 Chronic diastolic (congestive) heart failure: Secondary | ICD-10-CM | POA: Diagnosis not present

## 2019-03-08 DIAGNOSIS — I739 Peripheral vascular disease, unspecified: Secondary | ICD-10-CM | POA: Diagnosis not present

## 2019-03-08 DIAGNOSIS — I25119 Atherosclerotic heart disease of native coronary artery with unspecified angina pectoris: Secondary | ICD-10-CM | POA: Diagnosis not present

## 2019-03-08 DIAGNOSIS — I110 Hypertensive heart disease with heart failure: Secondary | ICD-10-CM

## 2019-03-08 DIAGNOSIS — E7800 Pure hypercholesterolemia, unspecified: Secondary | ICD-10-CM | POA: Diagnosis not present

## 2019-03-08 DIAGNOSIS — Z7189 Other specified counseling: Secondary | ICD-10-CM

## 2019-03-08 NOTE — Patient Instructions (Signed)

## 2019-04-01 DIAGNOSIS — H353221 Exudative age-related macular degeneration, left eye, with active choroidal neovascularization: Secondary | ICD-10-CM | POA: Diagnosis not present

## 2019-04-01 DIAGNOSIS — H353212 Exudative age-related macular degeneration, right eye, with inactive choroidal neovascularization: Secondary | ICD-10-CM | POA: Diagnosis not present

## 2019-04-19 ENCOUNTER — Telehealth: Payer: Self-pay | Admitting: Cardiology

## 2019-04-19 MED ORDER — NITROGLYCERIN 0.4 MG SL SUBL: 0.4000 mg | SUBLINGUAL_TABLET | SUBLINGUAL | 4 refills | Status: DC | PRN

## 2019-04-19 NOTE — Telephone Encounter (Signed)
Refill for nitroglycerin sent to [facility] as requested.

## 2019-04-19 NOTE — Telephone Encounter (Signed)
Call nitro to [facility]

## 2019-04-21 ENCOUNTER — Other Ambulatory Visit: Payer: Self-pay | Admitting: Cardiology

## 2019-04-21 NOTE — Telephone Encounter (Signed)
Rx for amlodipine sent to pharmacy as requested.

## 2019-04-21 NOTE — Telephone Encounter (Signed)
Call amlodipine to [facility]

## 2019-04-25 DIAGNOSIS — Z23 Encounter for immunization: Secondary | ICD-10-CM | POA: Diagnosis not present

## 2019-05-04 DIAGNOSIS — H353221 Exudative age-related macular degeneration, left eye, with active choroidal neovascularization: Secondary | ICD-10-CM | POA: Diagnosis not present

## 2019-05-04 DIAGNOSIS — H353212 Exudative age-related macular degeneration, right eye, with inactive choroidal neovascularization: Secondary | ICD-10-CM | POA: Diagnosis not present

## 2019-05-31 ENCOUNTER — Other Ambulatory Visit: Payer: Self-pay | Admitting: Cardiology

## 2019-06-16 DIAGNOSIS — H353212 Exudative age-related macular degeneration, right eye, with inactive choroidal neovascularization: Secondary | ICD-10-CM | POA: Diagnosis not present

## 2019-06-16 DIAGNOSIS — H353221 Exudative age-related macular degeneration, left eye, with active choroidal neovascularization: Secondary | ICD-10-CM | POA: Diagnosis not present

## 2019-07-25 ENCOUNTER — Other Ambulatory Visit: Payer: Self-pay | Admitting: Cardiology

## 2019-08-09 ENCOUNTER — Telehealth: Payer: Self-pay | Admitting: Cardiology

## 2019-08-12 ENCOUNTER — Other Ambulatory Visit: Payer: Self-pay | Admitting: *Deleted

## 2019-08-12 MED ORDER — LISINOPRIL 10 MG PO TABS: 10.0000 mg | ORAL_TABLET | Freq: Every day | ORAL | 1 refills | Status: DC

## 2019-08-12 NOTE — Telephone Encounter (Signed)
*  STAT* If patient is at the pharmacy, call can be transferred to refill team.   1. Which medications need to be refilled? (please list name of each medication and dose if known) Lisinopril 10mg   2. Which pharmacy/location (including street and city if local pharmacy) is medication to be sent to?[id number]  3. Do they need a 30 day or 90 day supply? [place]

## 2019-08-29 ENCOUNTER — Other Ambulatory Visit: Payer: Self-pay | Admitting: Cardiology

## 2019-09-13 ENCOUNTER — Telehealth: Payer: Self-pay | Admitting: Cardiology

## 2019-09-13 NOTE — Telephone Encounter (Signed)
Patient's wife, Lelon Frohlich, is requesting to accompany the patient during his appointment on tomorrow, 09/14/19 at 1:55 PM with Dr. Bettina Gavia due to the patient being legally blind. Patient's wife states that both the patient and herself have received the COVID-19 vaccination. Please return call to discuss.

## 2019-09-13 NOTE — Progress Notes (Signed)
Cardiology Office Note:    Date:  09/14/2019   ID:  Troy [name], DOB 11-May-1936, MRN [id number]  PCP:  Myer Peer, MD  Cardiologist:  Shirlee More, MD    Referring MD: Myer Peer, MD    ASSESSMENT:    1. Coronary artery disease involving native coronary artery of native heart with angina pectoris ([place])   2. Chronic diastolic heart failure ([place])   3. Hypertensive heart disease with heart failure ([place])   4. Pure hypercholesterolemia   5. PAD (peripheral artery disease) (HCC)    PLAN:    In order of problems listed above:  1. Stable CAD presently New York Heart Association class I having no angina on current medical therapy and continues treatment including calcium channel blocker beta-blocker statin and clopidogrel 2. Stable heart failure compensated continue his current diuretic 3. Stable hypertension Home blood pressure runs in range 4. Lipids are ideal continue with statin 5. Marked improvement in quality of his life after PCI and stent lower extremity stenosis.   Next appointment: 6 months   Medication Adjustments/Labs and Tests Ordered: Current medicines are reviewed at length with the patient today.  Concerns regarding medicines are outlined above.  Orders Placed This Encounter  Procedures  . EKG 12-Lead   No orders of the defined types were placed in this encounter.   Chief Complaint  Patient presents with  . Follow-up  . Coronary Artery Disease  . Congestive Heart Failure    History of Present Illness:    Troy [name] is a 84 y.o. male with a hx of  CAD, CHF, Dyslipidemia, HTN, AS, S/P PCI in 2001 and anemia.. Echo 04/26/18 shows EF 55-60%, gr1DD, mild to mod MR, mild AS peak/mean gradient 10/12 mmHg.    He underwent 09/10/18 an  abdominal aortogram with bilateral lower extremity runoff by Dr. Oneida Alar for claudication. He had a stent placed in R common iliac artery.   He was last seen 03/08/2019.  Compliance with diet, lifestyle and  medications: Yes  Fortunately his wife and is a retired Optician, dispensing him closely and at times reduces his beta-blocker if he has heart rates of less than 50.  He has had no edema orthopnea shortness of breath no angina palpitation or syncope he is increasingly limited by hearing and sight deficit and no longer keeps a church.  Recent labs handcarried from her PCP office 08/26/2019 shows chronic anemia 10.0 A1c 6.7% creatinine elevated at 1.5 GFR 44 cc stage III CKD potassium normal 4.2.  Lipid profile shows cholesterol 128 HDL 30 LDL 62 Past Medical History:  Diagnosis Date  . Anemia 12/09/2015  . CAD in native artery 12/09/2015   Overview:  PCI/ stent 2001  . Chronic diastolic heart failure ([place]) 12/09/2015  . Hyperlipidemia 12/09/2015  . Hypertensive heart disease with heart failure ([place]) 12/09/2015  . Necrotizing pancreatitis 06/26/2015  . Pseudocyst of pancreas 06/26/2015    Past Surgical History:  Procedure Laterality Date  . ABDOMINAL AORTOGRAM W/LOWER EXTREMITY Bilateral 09/10/2018   Procedure: ABDOMINAL AORTOGRAM W/LOWER EXTREMITY;  Surgeon: Elam Dutch, MD;  Location: [place] [facility];  Service: Cardiovascular;  Laterality: Bilateral;  . CHOLECYSTECTOMY    . PERIPHERAL VASCULAR INTERVENTION Right 09/10/2018   Procedure: PERIPHERAL VASCULAR INTERVENTION;  Surgeon: Elam Dutch, MD;  Location: [place] [facility];  Service: Cardiovascular;  Laterality: Right;  common iliac  . TONSILLECTOMY AND ADENOIDECTOMY    . TOTAL KNEE REVISION Bilateral     Current  Medications: Current Meds  Medication Sig  . amLODipine (NORVASC) 5 MG tablet TAKE 1 TABLET BY MOUTH EVERY DAY  . atorvastatin (LIPITOR) 80 MG tablet TAKE 1 TABLET EACH EVENING  . Cholecalciferol (VITAMIN D3) 50 MCG (2000 UT) TABS Take 4,000 Units by mouth daily.  . clopidogrel (PLAVIX) 75 MG tablet Take 75 mg by mouth daily.  Marland Kitchen doxazosin (CARDURA) 2 MG tablet Take 2 mg by mouth at bedtime.   . insulin glargine (LANTUS)  100 UNIT/ML injection Inject 25 Units into the skin daily.   Marland Kitchen lisinopril (ZESTRIL) 10 MG tablet Take 1 tablet (10 mg total) by mouth daily.  . metFORMIN (GLUCOPHAGE) 500 MG tablet Take 500 mg by mouth 2 (two) times daily with a meal.   . metoprolol tartrate (LOPRESSOR) 100 MG tablet TAKE ONE TABLET BY MOUTH TWICE A DAY  . Multiple Vitamins-Minerals (EYE VITAMINS PO) Take 1 tablet by mouth daily.  . nitroGLYCERIN (NITROSTAT) 0.4 MG SL tablet Place 1 tablet (0.4 mg total) under the tongue every 5 (five) minutes as needed for chest pain.  Marland Kitchen NOVOFINE 32G X 6 MM MISC daily. as directed  . omeprazole (PRILOSEC) 40 MG capsule Take 40 mg by mouth daily.  Marland Kitchen spironolactone (ALDACTONE) 25 MG tablet TAKE 1/2 TABLET BY MOUTH ONCE EVERY DAY     Allergies:   Patient has no known allergies.   Social History   Socioeconomic History  . Marital status: Married    Spouse name: Not on file  . Number of children: Not on file  . Years of education: Not on file  . Highest education level: Not on file  Occupational History  . Not on file  Tobacco Use  . Smoking status: Former Smoker    Types: Cigarettes  . Smokeless tobacco: Never Used  Substance and Sexual Activity  . Alcohol use: No  . Drug use: No  . Sexual activity: Not on file  Other Topics Concern  . Not on file  Social History Narrative  . Not on file   Social Determinants of Health   Financial Resource Strain:   . Difficulty of Paying Living Expenses: Not on file  Food Insecurity:   . Worried About Charity fundraiser in the Last Year: Not on file  . Ran Out of Food in the Last Year: Not on file  Transportation Needs:   . Lack of Transportation (Medical): Not on file  . Lack of Transportation (Non-Medical): Not on file  Physical Activity:   . Days of Exercise per Week: Not on file  . Minutes of Exercise per Session: Not on file  Stress:   . Feeling of Stress : Not on file  Social Connections:   . Frequency of Communication with  Friends and Family: Not on file  . Frequency of Social Gatherings with Friends and Family: Not on file  . Attends Religious Services: Not on file  . Active Member of Clubs or Organizations: Not on file  . Attends Archivist Meetings: Not on file  . Marital Status: Not on file     Family History: The patient's family history includes Diabetes in his mother; Heart failure in his father and mother. ROS:   Please see the history of present illness.    All other systems reviewed and are negative.  EKGs/Labs/Other Studies Reviewed:    The following studies were reviewed today:  EKG:  EKG ordered today and personally reviewed.  The ekg ordered today demonstrates rated cardia 52 bpm  first-degree AV block otherwise normal  Recent Labs: Seen 2020 creatinine elevated at 1.40 hemoglobin diminished 10.2 glucose 131   Physical Exam:    VS:  BP (!) 160/56   Pulse (!) 52   Temp (!) 97.5 F (36.4 C)   Ht 5\' 11"  (1.803 m)   Wt 175 lb (79.4 kg)   SpO2 98%   BMI 24.41 kg/m     Wt Readings from Last 3 Encounters:  09/14/19 175 lb (79.4 kg)  03/08/19 173 lb (78.5 kg)  09/10/18 170 lb (77.1 kg)     GEN:  Well nourished, well developed in no acute distress HEENT: Normal NECK: No JVD; No carotid bruits LYMPHATICS: No lymphadenopathy CARDIAC: RRR, no murmurs, rubs, gallops RESPIRATORY:  Clear to auscultation without rales, wheezing or rhonchi  ABDOMEN: Soft, non-tender, non-distended MUSCULOSKELETAL:  No edema; No deformity  SKIN: Warm and dry NEUROLOGIC:  Alert and oriented x 3 PSYCHIATRIC:  Normal affect    Signed, Shirlee More, MD  09/14/2019 2:40 PM    [facility]

## 2019-09-13 NOTE — Telephone Encounter (Signed)
Telephone call to patient. Informed wife that she could come back with him to the appointment due to him being legally blind.

## 2019-09-14 ENCOUNTER — Encounter: Payer: Self-pay | Admitting: Cardiology

## 2019-09-14 ENCOUNTER — Ambulatory Visit (INDEPENDENT_AMBULATORY_CARE_PROVIDER_SITE_OTHER): Payer: Medicare Other | Admitting: Cardiology

## 2019-09-14 ENCOUNTER — Other Ambulatory Visit: Payer: Self-pay

## 2019-09-14 VITALS — BP 160/56 | HR 52 | Temp 97.5°F | Ht 71.0 in | Wt 175.0 lb

## 2019-09-14 DIAGNOSIS — I25119 Atherosclerotic heart disease of native coronary artery with unspecified angina pectoris: Secondary | ICD-10-CM

## 2019-09-14 DIAGNOSIS — I110 Hypertensive heart disease with heart failure: Secondary | ICD-10-CM

## 2019-09-14 DIAGNOSIS — E7800 Pure hypercholesterolemia, unspecified: Secondary | ICD-10-CM | POA: Diagnosis not present

## 2019-09-14 DIAGNOSIS — I5032 Chronic diastolic (congestive) heart failure: Secondary | ICD-10-CM | POA: Diagnosis not present

## 2019-09-14 DIAGNOSIS — I739 Peripheral vascular disease, unspecified: Secondary | ICD-10-CM

## 2019-09-14 NOTE — Patient Instructions (Signed)
Medication Instructions:  Your physician recommends that you continue on your current medications as directed. Please refer to the Current Medication list given to you today.  *If you need a refill on your cardiac medications before your next appointment, please call your pharmacy*  Lab Work: None ordered  If you have labs (blood work) drawn today and your tests are completely normal, you will receive your results only by: Marland Kitchen MyChart Message (if you have MyChart) OR . A paper copy in the mail If you have any lab test that is abnormal or we need to change your treatment, we will call you to review the results.  Testing/Procedures: None ordered  Follow-Up: At [facility], you and your health needs are our priority.  As part of our continuing mission to provide you with exceptional heart care, we have created designated Provider Care Teams.  These Care Teams include your primary Cardiologist (physician) and Advanced Practice Providers (APPs -  Physician Assistants and Nurse Practitioners) who all work together to provide you with the care you need, when you need it.  Your next appointment:   6 month(s)  The format for your next appointment:   In Person  Provider:   Shirlee More, MD  Other Instructions

## 2019-11-21 ENCOUNTER — Other Ambulatory Visit: Payer: Self-pay | Admitting: Cardiology

## 2020-02-02 ENCOUNTER — Other Ambulatory Visit: Payer: Self-pay | Admitting: Cardiology

## 2020-02-05 ENCOUNTER — Other Ambulatory Visit: Payer: Self-pay | Admitting: Cardiology

## 2020-02-15 ENCOUNTER — Other Ambulatory Visit: Payer: Self-pay | Admitting: Cardiology

## 2020-03-28 ENCOUNTER — Ambulatory Visit: Payer: Medicare Other | Admitting: Cardiology

## 2020-03-28 NOTE — Progress Notes (Deleted)
Cardiology Office Note:    Date:  03/28/2020   ID:  Troy [name], DOB 1935/09/14, MRN [id number]  PCP:  Myer Peer, MD  Cardiologist:  Shirlee More, MD    Referring MD: Myer Peer, MD    ASSESSMENT:    No diagnosis found. PLAN:    In order of problems listed above:  1. ***   Next appointment: ***   Medication Adjustments/Labs and Tests Ordered: Current medicines are reviewed at length with the patient today.  Concerns regarding medicines are outlined above.  No orders of the defined types were placed in this encounter.  No orders of the defined types were placed in this encounter.   No chief complaint on file.   History of Present Illness:    Troy [name] is a 84 y.o. male with a hx of  CAD, CHF, Dyslipidemia, HTN, AS, S/P PCI in 2001 and anemia.. Echo 04/26/18 shows EF 55-60%, , PAD with right common iliac artery stent February 2020, mild to mod MR, mild AS   last seen 09/14/2019.  He has been seen for right lung nodule and [facility] pulmonary.  Biopsy showed infectious inflammatory origin and no findings of lung cancer. Compliance with diet, lifestyle and medications: ***  [place] 03/06/2020: Hemoglobin 9.2 Creatinine 1.7 GFR 39 cc/min Past Medical History:  Diagnosis Date  . Anemia 12/09/2015  . CAD in native artery 12/09/2015   Overview:  PCI/ stent 2001  . Chronic diastolic heart failure ([place]) 12/09/2015  . Coronary artery disease involving native coronary artery of native heart with angina pectoris ([place]) 12/09/2015   Overview:  PCI/ stent 2001  . Hyperlipidemia 12/09/2015  . Hypertensive heart disease with heart failure ([place]) 12/09/2015  . Necrotizing pancreatitis 06/26/2015  . Pseudocyst of pancreas 06/26/2015    Past Surgical History:  Procedure Laterality Date  . ABDOMINAL AORTOGRAM W/LOWER EXTREMITY Bilateral 09/10/2018   Procedure: ABDOMINAL AORTOGRAM W/LOWER EXTREMITY;  Surgeon: Elam Dutch, MD;  Location:  [place] [facility];  Service: Cardiovascular;  Laterality: Bilateral;  . CHOLECYSTECTOMY    . PERIPHERAL VASCULAR INTERVENTION Right 09/10/2018   Procedure: PERIPHERAL VASCULAR INTERVENTION;  Surgeon: Elam Dutch, MD;  Location: [place] [facility];  Service: Cardiovascular;  Laterality: Right;  common iliac  . TONSILLECTOMY AND ADENOIDECTOMY    . TOTAL KNEE REVISION Bilateral     Current Medications: No outpatient medications have been marked as taking for the 03/28/20 encounter (Appointment) with Richardo Priest, MD.     Allergies:   Patient has no known allergies.   Social History   Socioeconomic History  . Marital status: Married    Spouse name: Not on file  . Number of children: Not on file  . Years of education: Not on file  . Highest education level: Not on file  Occupational History  . Not on file  Tobacco Use  . Smoking status: Former Smoker    Types: Cigarettes  . Smokeless tobacco: Never Used  Vaping Use  . Vaping Use: Never used  Substance and Sexual Activity  . Alcohol use: No  . Drug use: No  . Sexual activity: Not on file  Other Topics Concern  . Not on file  Social History Narrative  . Not on file   Social Determinants of Health   Financial Resource Strain:   . Difficulty of Paying Living Expenses: Not on file  Food Insecurity:   . Worried About Charity fundraiser in  the Last Year: Not on file  . Ran Out of Food in the Last Year: Not on file  Transportation Needs:   . Lack of Transportation (Medical): Not on file  . Lack of Transportation (Non-Medical): Not on file  Physical Activity:   . Days of Exercise per Week: Not on file  . Minutes of Exercise per Session: Not on file  Stress:   . Feeling of Stress : Not on file  Social Connections:   . Frequency of Communication with Friends and Family: Not on file  . Frequency of Social Gatherings with Friends and Family: Not on file  . Attends Religious Services: Not on file  . Active Member of  Clubs or Organizations: Not on file  . Attends Archivist Meetings: Not on file  . Marital Status: Not on file     Family History: The patient's ***family history includes Diabetes in his mother; Heart failure in his father and mother. ROS:   Please see the history of present illness.    All other systems reviewed and are negative.  EKGs/Labs/Other Studies Reviewed:    The following studies were reviewed today:  EKG:  EKG ordered today and personally reviewed.  The ekg ordered today demonstrates ***  Recent Labs: No results found for requested labs within last 8760 hours.  Recent Lipid Panel No results found for: CHOL, TRIG, HDL, CHOLHDL, VLDL, LDLCALC, LDLDIRECT  Physical Exam:    VS:  There were no vitals taken for this visit.    Wt Readings from Last 3 Encounters:  09/14/19 175 lb (79.4 kg)  03/08/19 173 lb (78.5 kg)  09/10/18 170 lb (77.1 kg)     GEN: *** Well nourished, well developed in no acute distress HEENT: Normal NECK: No JVD; No carotid bruits LYMPHATICS: No lymphadenopathy CARDIAC: ***RRR, no murmurs, rubs, gallops RESPIRATORY:  Clear to auscultation without rales, wheezing or rhonchi  ABDOMEN: Soft, non-tender, non-distended MUSCULOSKELETAL:  No edema; No deformity  SKIN: Warm and dry NEUROLOGIC:  Alert and oriented x 3 PSYCHIATRIC:  Normal affect    Signed, Shirlee More, MD  03/28/2020 7:24 AM    [facility]

## 2020-03-30 ENCOUNTER — Ambulatory Visit (INDEPENDENT_AMBULATORY_CARE_PROVIDER_SITE_OTHER): Payer: Medicare Other | Admitting: Cardiology

## 2020-03-30 ENCOUNTER — Other Ambulatory Visit: Payer: Self-pay

## 2020-03-30 ENCOUNTER — Encounter: Payer: Self-pay | Admitting: Cardiology

## 2020-03-30 VITALS — BP 163/66 | HR 56 | Ht 71.0 in | Wt 166.8 lb

## 2020-03-30 DIAGNOSIS — I110 Hypertensive heart disease with heart failure: Secondary | ICD-10-CM | POA: Diagnosis not present

## 2020-03-30 DIAGNOSIS — E7800 Pure hypercholesterolemia, unspecified: Secondary | ICD-10-CM

## 2020-03-30 DIAGNOSIS — I5032 Chronic diastolic (congestive) heart failure: Secondary | ICD-10-CM

## 2020-03-30 DIAGNOSIS — I25119 Atherosclerotic heart disease of native coronary artery with unspecified angina pectoris: Secondary | ICD-10-CM

## 2020-03-30 NOTE — Patient Instructions (Signed)

## 2020-03-30 NOTE — Progress Notes (Signed)
Cardiology Office Note:    Date:  03/30/2020   ID:  Troy [name], DOB [birth year], MRN [id number]  PCP:  Street, [name], MD  Cardiologist:  Shirlee More, MD    Referring MD: Myer Peer, MD    ASSESSMENT:    1. Coronary artery disease involving native coronary artery of native heart with angina pectoris ([facility])   2. Chronic diastolic heart failure ([place])   3. Hypertensive heart disease with heart failure ([place])   4. Pure hypercholesterolemia    PLAN:    In order of problems listed above:  1. Stable CAD after CABG is having no angina on current medical therapy we will continue clopidogrel current lipid-lowering treatment with a high intensity statin. 2. Compensated presently not on a loop diuretic his creatinine is worsened when asked him to stop spironolactone at this time 3. For him I think he is well controlled his wife is a nurse follows him and predominantly is in range on current antihypertensives including ACE inhibitor and Norvasc 4. Ideal lipids continue statin 5. Stable diabetes managed by his PCP on insulin   Next appointment: 6 months   Medication Adjustments/Labs and Tests Ordered: Current medicines are reviewed at length with the patient today.  Concerns regarding medicines are outlined above.  No orders of the defined types were placed in this encounter.  No orders of the defined types were placed in this encounter.   No chief complaint on file.   History of Present Illness:    Troy [name] is a 84 y.o. male with a hx of  CAD, CHF, Dyslipidemia, HTN, AS, S/P PCI in 2001 and anemia.. Echo 04/26/18 shows EF 55-60%, , PAD with right common iliac artery stent February 2020, mild to mod MR, mild AS   last seen 09/14/2019.  He has been seen for right lung nodule and [facility].  Biopsy showed infectious inflammatory origin and no findings of lung cancer.  Compliance with diet, lifestyle and medications: Yes  Colbie Danner continues  to struggle with his eating and nutrition from pancreatitis. Is placed on his lung biopsy did not show malignancy. He is not having chest pain edema shortness of breath or syncope. His wife is a retired Marine scientist tells me his blood pressure is quite labile at times less than 130 and at times greater than 409 systolic at home and I would not intensify his antihypertensive therapy. She checks his pulse twice a day and has had no rates less than 50. Sent labs reviewed from his primary care physician's office lipids are at target cholesterol 126 LDL 62 triglycerides 176 HDL 29 he has significant CKD with a creatinine of 2.0 these were done 03/12/2020 his creatinine last visit was 1.5  Past Medical History:  Diagnosis Date  . Anemia 12/09/2015  . CAD in native artery 12/09/2015   Overview:  PCI/ stent 2001  . Chronic diastolic heart failure ([place]) 12/09/2015  . Coronary artery disease involving native coronary artery of native heart with angina pectoris ([place]) 12/09/2015   Overview:  PCI/ stent 2001  . Hyperlipidemia 12/09/2015  . Hypertensive heart disease with heart failure ([place]) 12/09/2015  . Necrotizing pancreatitis 06/26/2015  . Pseudocyst of pancreas 06/26/2015    Past Surgical History:  Procedure Laterality Date  . ABDOMINAL AORTOGRAM W/LOWER EXTREMITY Bilateral 09/10/2018   Procedure: ABDOMINAL AORTOGRAM W/LOWER EXTREMITY;  Surgeon: Elam Dutch, MD;  Location: [facility];  Service: Cardiovascular;  Laterality: Bilateral;  . CHOLECYSTECTOMY    .  PERIPHERAL VASCULAR INTERVENTION Right 09/10/2018   Procedure: PERIPHERAL VASCULAR INTERVENTION;  Surgeon: Elam Dutch, MD;  Location: [facility];  Service: Cardiovascular;  Laterality: Right;  common iliac  . TONSILLECTOMY AND ADENOIDECTOMY    . TOTAL KNEE REVISION Bilateral     Current Medications: Current Meds  Medication Sig  . amLODipine (NORVASC) 5 MG tablet TAKE 1 TABLET EVERY DAY  . ascorbic acid (VITAMIN C) 500 MG/5ML  syrup Take 500 mg by mouth daily.  Marland Kitchen atorvastatin (LIPITOR) 80 MG tablet TAKE 1 TABLET EACH EVENING  . Cholecalciferol (VITAMIN D3) 50 MCG (2000 UT) TABS Take 4,000 Units by mouth daily.  . clopidogrel (PLAVIX) 75 MG tablet Take 75 mg by mouth daily.  Marland Kitchen doxazosin (CARDURA) 2 MG tablet Take 2 mg by mouth at bedtime.   . insulin glargine (LANTUS) 100 UNIT/ML injection Inject 25 Units into the skin daily.   . Iron Combinations (IRON COMPLEX PO) Take 65 mg by mouth daily.  Marland Kitchen lisinopril (ZESTRIL) 10 MG tablet TAKE 1 TABLET BY MOUTH EVERY DAY  . metFORMIN (GLUCOPHAGE) 500 MG tablet Take 500 mg by mouth 2 (two) times daily with a meal. TAKE 0.5 TABLET TWICE A DAY  . metoprolol tartrate (LOPRESSOR) 100 MG tablet TAKE 1 TABLET TWICE A DAY  . Multiple Vitamins-Minerals (EYE VITAMINS PO) Take 1 tablet by mouth daily.  . nitroGLYCERIN (NITROSTAT) 0.4 MG SL tablet Place 1 tablet (0.4 mg total) under the tongue every 5 (five) minutes as needed for chest pain.  Marland Kitchen NOVOFINE 32G X 6 MM MISC daily. as directed  . omeprazole (PRILOSEC) 40 MG capsule Take 40 mg by mouth daily.  . Pancrelipase, Lip-Prot-Amyl, (CREON) 24000-76000 units CPEP Take 1 capsule by mouth in the morning, at noon, and at bedtime.  Marland Kitchen spironolactone (ALDACTONE) 25 MG tablet TAKE 1/2 TABLET BY MOUTH ONCE EVERY DAY     Allergies:   Patient has no known allergies.   Social History   Socioeconomic History  . Marital status: Married    Spouse name: Not on file  . Number of children: Not on file  . Years of education: Not on file  . Highest education level: Not on file  Occupational History  . Not on file  Tobacco Use  . Smoking status: Former Smoker    Types: Cigarettes  . Smokeless tobacco: Never Used  Vaping Use  . Vaping Use: Never used  Substance and Sexual Activity  . Alcohol use: No  . Drug use: No  . Sexual activity: Not on file  Other Topics Concern  . Not on file  Social History Narrative  . Not on file   Social  Determinants of Health   Financial Resource Strain:   . Difficulty of Paying Living Expenses: Not on file  Food Insecurity:   . Worried About Charity fundraiser in the Last Year: Not on file  . Ran Out of Food in the Last Year: Not on file  Transportation Needs:   . Lack of Transportation (Medical): Not on file  . Lack of Transportation (Non-Medical): Not on file  Physical Activity:   . Days of Exercise per Week: Not on file  . Minutes of Exercise per Session: Not on file  Stress:   . Feeling of Stress : Not on file  Social Connections:   . Frequency of Communication with Friends and Family: Not on file  . Frequency of Social Gatherings with Friends and Family: Not on file  . Attends  Religious Services: Not on file  . Active Member of Clubs or Organizations: Not on file  . Attends Archivist Meetings: Not on file  . Marital Status: Not on file     Family History: The patient's family history includes Diabetes in his mother; Heart failure in his father and mother. ROS:   Please see the history of present illness.    All other systems reviewed and are negative.  EKGs/Labs/Other Studies Reviewed:    The following studies were reviewed today:  EKG:  EKG ordered today and personally reviewed.  The ekg ordered today demonstrates sinus bradycardia 49 bpm first-degree AV block LVH no acute ischemic changes   Physical Exam:    VS:  BP (!) 163/66   Pulse (!) 56   Ht 5\' 11"  (1.803 m)   Wt 166 lb 12.8 oz (75.7 kg)   SpO2 97%   BMI 23.26 kg/m     Wt Readings from Last 3 Encounters:  03/30/20 166 lb 12.8 oz (75.7 kg)  09/14/19 175 lb (79.4 kg)  03/08/19 173 lb (78.5 kg)     GEN: He appears more frail well nourished, well developed in no acute distress HEENT: Normal NECK: No JVD; No carotid bruits LYMPHATICS: No lymphadenopathy CARDIAC: RRR, no murmurs, rubs, gallops RESPIRATORY:  Clear to auscultation without rales, wheezing or rhonchi  ABDOMEN: Soft,  non-tender, non-distended MUSCULOSKELETAL:  No edema; No deformity  SKIN: Warm and dry NEUROLOGIC:  Alert and oriented x 3 PSYCHIATRIC:  Normal affect    Signed, Shirlee More, MD  03/30/2020 9:25 AM    [place]

## 2020-06-18 ENCOUNTER — Telehealth: Payer: Self-pay | Admitting: Cardiology

## 2020-06-18 MED ORDER — METOPROLOL TARTRATE 100 MG PO TABS: 100.0000 mg | ORAL_TABLET | Freq: Two times a day (BID) | ORAL | 3 refills | Status: DC

## 2020-06-18 MED ORDER — AMLODIPINE BESYLATE 5 MG PO TABS: 5.0000 mg | ORAL_TABLET | Freq: Every day | ORAL | 3 refills | Status: DC

## 2020-06-18 MED ORDER — LISINOPRIL 10 MG PO TABS: 10.0000 mg | ORAL_TABLET | Freq: Every day | ORAL | 3 refills | Status: DC

## 2020-06-18 NOTE — Telephone Encounter (Signed)
*  STAT* If patient is at the pharmacy, call can be transferred to refill team.   1. Which medications need to be refilled? (please list name of each medication and dose if known) Lisinoprill, Metoprolol and  Amlodipine   . Which pharmacy/location (including street and city if local pharmacy) is medication to be sent to? [facility]- [phone]  3. Do they need a 30 day or 90 day supply? 90 days sand refills

## 2020-06-18 NOTE — Telephone Encounter (Signed)
Refills sent to pharmacy. Carly notified.

## 2020-06-19 ENCOUNTER — Telehealth: Payer: Self-pay | Admitting: Cardiology

## 2020-06-19 NOTE — Telephone Encounter (Signed)
I spoke with patient's wife. She reports HCTZ and Aldactone were stopped awhile ago due to patient's kidney function. Patient's wife has been checking patient's heart rate all along and recently started keeping record of patient's BP since 11/8.  She reports heart rate has been 52-64 and metoprolol is titrated based on heart rate.  Current dose of metoprolol is 100 mg in the AM and 50 mg in the PM.  Wife checks BP twice daily.  This AM patient's BP was 174/72.  Yesterday in the AM it was 156/71 and in the PM was 164/66.  On 11/17 in the PM it was 199/60's and on 11/18 it was 173/70.  Wife reports most readings since early November have been around 175/69-71.  Patient will occasionally take one NTG if BP is very elevated.  Patient is feeling good.  Will forward to Dr Bettina Gavia for review/recommendations.

## 2020-06-19 NOTE — Telephone Encounter (Signed)
Add clonidine 0.1 mg at hs

## 2020-06-19 NOTE — Telephone Encounter (Signed)
Pt c/o BP issue: STAT if pt c/o blurred vision, one-sided weakness or slurred speech  1. What are your last 5 BP readings? 175/69  2. Are you having any other symptoms (ex. Dizziness, headache, blurred vision, passed out)? no  3. What is your BP issue? Patient's wife states he was taken off 3 BP medications and his BP has been high. She would like to know if he needs to be put on something again.

## 2020-06-20 MED ORDER — CLONIDINE HCL 0.1 MG PO TABS: 0.1000 mg | ORAL_TABLET | Freq: Every day | ORAL | 3 refills | Status: DC

## 2020-06-20 NOTE — Telephone Encounter (Signed)
Spoke with the patient and the patients wife just now and let them know Dr. Joya Gaskins recommendation. They verbalize understanding and are agreeable to this plan of care.    Encouraged patient to call back with any questions or concerns.

## 2020-06-27 ENCOUNTER — Encounter: Payer: Self-pay | Admitting: Sports Medicine

## 2020-06-27 ENCOUNTER — Ambulatory Visit (INDEPENDENT_AMBULATORY_CARE_PROVIDER_SITE_OTHER): Payer: Medicare Other | Admitting: Sports Medicine

## 2020-06-27 ENCOUNTER — Other Ambulatory Visit: Payer: Self-pay

## 2020-06-27 DIAGNOSIS — I25119 Atherosclerotic heart disease of native coronary artery with unspecified angina pectoris: Secondary | ICD-10-CM | POA: Diagnosis not present

## 2020-06-27 DIAGNOSIS — B351 Tinea unguium: Secondary | ICD-10-CM | POA: Diagnosis not present

## 2020-06-27 DIAGNOSIS — E1140 Type 2 diabetes mellitus with diabetic neuropathy, unspecified: Secondary | ICD-10-CM | POA: Diagnosis not present

## 2020-06-27 DIAGNOSIS — M79674 Pain in right toe(s): Secondary | ICD-10-CM

## 2020-06-27 DIAGNOSIS — Z7901 Long term (current) use of anticoagulants: Secondary | ICD-10-CM

## 2020-06-27 DIAGNOSIS — M79675 Pain in left toe(s): Secondary | ICD-10-CM

## 2020-06-27 DIAGNOSIS — L601 Onycholysis: Secondary | ICD-10-CM | POA: Diagnosis not present

## 2020-06-27 NOTE — Progress Notes (Signed)
Subjective: Troy [name] is a 84 y.o. male patient with history of diabetes who presents to office today complaining of long,mildly painful nails  while ambulating in shoes; unable to trim and reports that his left fifth toe [place] is concerned because he is diabetic about infection. Patient states that the glucose reading this morning was 95 mg/dl, last A1c around 7. Patient denies any new changes in medication or new problems.  No other pedal complaints noted.  Last visit to PCP Dr. Venetia Maxon 1 month ago  Review of systems contributory  Patient Active Problem List   Diagnosis Date Noted  . Anemia 12/09/2015  . Coronary artery disease involving native coronary artery of native heart with angina pectoris ([place]) 12/09/2015  . Chronic diastolic heart failure ([place]) 12/09/2015  . Hyperlipidemia 12/09/2015  . Hypertensive heart disease with heart failure ([place]) 12/09/2015  . CAD in native artery 12/09/2015  . Necrotizing pancreatitis 06/26/2015  . Pseudocyst of pancreas 06/26/2015   Current Outpatient Medications on File Prior to Visit  Medication Sig Dispense Refill  . amLODipine (NORVASC) 5 MG tablet Take 1 tablet (5 mg total) by mouth daily. 90 tablet 3  . ascorbic acid (VITAMIN C) 500 MG/5ML syrup Take 500 mg by mouth daily.    Marland Kitchen atorvastatin (LIPITOR) 80 MG tablet TAKE 1 TABLET EACH EVENING 90 tablet 1  . Cholecalciferol (VITAMIN D3) 50 MCG (2000 UT) TABS Take 4,000 Units by mouth daily.    . cloNIDine (CATAPRES) 0.1 MG tablet Take 1 tablet (0.1 mg total) by mouth at bedtime. 90 tablet 3  . clopidogrel (PLAVIX) 75 MG tablet Take 75 mg by mouth daily.    Marland Kitchen doxazosin (CARDURA) 2 MG tablet Take 2 mg by mouth at bedtime.     . insulin glargine (LANTUS) 100 UNIT/ML injection Inject 25 Units into the skin daily.     . Iron Combinations (IRON COMPLEX PO) Take 65 mg by mouth daily.    Marland Kitchen lisinopril (ZESTRIL) 10 MG tablet Take 1 tablet (10 mg total) by mouth daily. 90  tablet 3  . metFORMIN (GLUCOPHAGE) 500 MG tablet Take 500 mg by mouth 2 (two) times daily with a meal. TAKE 0.5 TABLET TWICE A DAY    . metoprolol tartrate (LOPRESSOR) 100 MG tablet Take 1 tablet (100 mg total) by mouth 2 (two) times daily. 180 tablet 3  . Multiple Vitamins-Minerals (EYE VITAMINS PO) Take 1 tablet by mouth daily.    . nitroGLYCERIN (NITROSTAT) 0.4 MG SL tablet Place 1 tablet (0.4 mg total) under the tongue every 5 (five) minutes as needed for chest pain. 25 tablet 4  . NOVOFINE 32G X 6 MM MISC daily. as directed    . omeprazole (PRILOSEC) 40 MG capsule Take 40 mg by mouth daily.    . Pancrelipase, Lip-Prot-Amyl, (CREON) 24000-76000 units CPEP Take 1 capsule by mouth in the morning, at noon, and at bedtime.     No current facility-administered medications on file prior to visit.   No Known Allergies  No results found for this or any previous visit (from the past 2160 hour(s)).  Objective: General: Patient is awake, alert, and oriented x 3 and in no acute distress.  Integument: Skin is warm, dry and supple bilateral. Nails are tender, long, thickened and dystrophic with subungual debris, consistent with onychomycosis, 1-5 bilateral.  There is partial attachment noted of the left fifth toenail with minimal erythema at the proximal nail fold of the left fifth  toe.  No acute signs of infection. No open lesions or preulcerative lesions present bilateral. Remaining integument unremarkable.  Vasculature:  Dorsalis Pedis pulse 1/4 bilateral. Posterior Tibial pulse 1/4 bilateral. Capillary fill time <5 sec 1-5 bilateral.  Minimal hair growth to the level of the digits.Temperature gradient within normal limits.  Minimal varicosities present bilateral. No edema present bilateral.   Neurology: The patient has diminished sensation measured with a 5.07/10g Semmes Weinstein Monofilament at all pedal sites bilateral . Vibratory sensation diminished bilateral with tuning fork. No Babinski sign  present bilateral.   Musculoskeletal: No symptomatic pedal deformities noted bilateral. Muscular strength 5/5 in all lower extremity muscular groups bilateral without pain on range of motion . No tenderness with calf compression bilateral.  Assessment and Plan: Problem List Items Addressed This Visit    None    Visit Diagnoses    Onycholysis    -  Primary   Pain due to onychomycosis of toenails of both feet       Type 2 diabetes mellitus with diabetic neuropathy, unspecified whether long term insulin use ([place])       Blood thinned due to long-term anticoagulant use          -Examined patient. -Discussed and educated patient on diabetic foot care, especially with  regards to the vascular, neurological and musculoskeletal systems.  -Stressed the importance of good glycemic control and the detriment of not  controlling glucose levels in relation to the foot. -Mechanically debrided all nails 1-5 bilateral using sterile nail nipper and filed with dremel without incident  -Advised patient and wife to use antibiotic cream to fifth toe for 1 week -Answered all patient questions -Patient to return  in 3 months for at risk foot care -Patient advised to call the office if any problems or questions arise in the meantime.  Landis Martins, DPM

## 2020-07-26 DIAGNOSIS — N1832 Chronic kidney disease, stage 3b: Secondary | ICD-10-CM | POA: Diagnosis not present

## 2020-07-26 DIAGNOSIS — E1122 Type 2 diabetes mellitus with diabetic chronic kidney disease: Secondary | ICD-10-CM | POA: Diagnosis not present

## 2020-07-26 DIAGNOSIS — E7849 Other hyperlipidemia: Secondary | ICD-10-CM | POA: Diagnosis not present

## 2020-07-26 DIAGNOSIS — K219 Gastro-esophageal reflux disease without esophagitis: Secondary | ICD-10-CM | POA: Diagnosis not present

## 2020-07-28 ENCOUNTER — Other Ambulatory Visit: Payer: Self-pay | Admitting: Cardiology

## 2020-08-02 ENCOUNTER — Other Ambulatory Visit: Payer: Self-pay | Admitting: Cardiology

## 2020-08-08 DIAGNOSIS — H353231 Exudative age-related macular degeneration, bilateral, with active choroidal neovascularization: Secondary | ICD-10-CM | POA: Diagnosis not present

## 2020-08-13 NOTE — Progress Notes (Signed)
Virtual Visit via Telephone Note   This visit type was conducted due to national recommendations for restrictions regarding the COVID-19 Pandemic (e.g. social distancing) in an effort to limit this patient's exposure and mitigate transmission in our community.  Due to his co-morbid illnesses, this patient is at least at moderate risk for complications without adequate follow up.  This format is felt to be most appropriate for this patient at this time.  The patient did not have access to video technology/had technical difficulties with video requiring transitioning to audio format only (telephone).  All issues noted in this document were discussed and addressed.  No physical exam could be performed with this format.  Please refer to the patient's chart for his  consent to telehealth for [facility].    Date:  08/14/2020   ID:  Troy [name], DOB May 11, 1936, MRN [id number] The patient was identified using 2 identifiers.  Patient Location: Home Provider Location: Office/Clinic  PCP:  Street, [name], MD  Cardiologist:  No primary care provider on file.  Dr. Bettina Gavia Electrophysiologist:  None   Evaluation Performed:  Follow-Up Visit  Chief Complaint: Cardiology follow-up hypertension heart failure CAD  History of Present Illness:    Troy [name] is a 85 y.o. male with  CAD, CHF, Dyslipidemia, HTN, AS, S/P PCI in 2001 and anemia.. Echo 04/26/18 shows EF 55-60%, , PAD with right common iliac artery stent February 2020, mild to mod MR, mild AS  and sinus bradycardia last seen 03/30/2020.  In response to a phone message 06/09/2020 clonidine was added for hypertension control as his thiazide diuretic and hydralazine were withdrawn by another physician.  [place] pulmonary medicine for pulmonary nodules.  Biopsy showed necrotizing granuloma.  Echo 04/26/2018: - Left ventricle: The cavity size was normal. Systolic function was  normal. The estimated ejection fraction was in  the range of 55%  to 60%. Wall motion was normal; there were no regional wall  motion abnormalities. Doppler parameters are consistent with  abnormal left ventricular relaxation (grade 1 diastolic  dysfunction).  - Aortic valve: Valve area (VTI): 1.75 cm^2. Valve area (Vmax):  1.79 cm^2. Valve area (Vmean): 1.54 cm^2.    Mean gradient (S): 12 mm Hg. Peak gradient (S): 21 mm Hg - Mitral valve: There was mild to moderate regurgitation.   The patient does not have symptoms concerning for COVID-19 infection (fever, chills, cough, or new shortness of breath).   His wife who is a Therapist, sports is present participates in evaluation decision making. During the day he has some anxiety because of his limitations. She thought his hypertension was well controlled with the pharmacist at [facility] family practice encouraged her to check evening blood pressures that are often in the range of 190 to 200 mmHg. She is given him nitroglycerin in the evenings for elevated blood pressure. To optimize BP control we will get a switch from metoprolol week to carvedilol strong antihypertensive agent and she will continue to monitor sinus bradycardia at home heart rates remain greater than 100 mmHg. She inquired about an anxiety medication and I told her to discuss this with her PCP. Her husband is increasingly dependent on her.  He has had no chest pain shortness of breath palpitation or syncope he has no muscle pain or weakness from his statin or GI side effects from clopidogrel.  Fortunately we are unable to culture into the video link lack of technology.  Ella follow-up office visit in person 6 weeks in  particular to review blood pressure and response to carvedilol and discuss whether or not we should do an echocardiogram of asymptomatic for aortic stenosis. Past Medical History:  Diagnosis Date  . Anemia 12/09/2015  . CAD in native artery 12/09/2015   Overview:  PCI/ stent 2001  . Chronic diastolic heart failure  ([place]) 12/09/2015  . Coronary artery disease involving native coronary artery of native heart with angina pectoris ([place]) 12/09/2015   Overview:  PCI/ stent 2001  . Hyperlipidemia 12/09/2015  . Hypertensive heart disease with heart failure ([place]) 12/09/2015  . Necrotizing pancreatitis 06/26/2015  . Pseudocyst of pancreas 06/26/2015   Past Surgical History:  Procedure Laterality Date  . ABDOMINAL AORTOGRAM W/LOWER EXTREMITY Bilateral 09/10/2018   Procedure: ABDOMINAL AORTOGRAM W/LOWER EXTREMITY;  Surgeon: Elam Dutch, MD;  Location: [place] [facility];  Service: Cardiovascular;  Laterality: Bilateral;  . CHOLECYSTECTOMY    . PERIPHERAL VASCULAR INTERVENTION Right 09/10/2018   Procedure: PERIPHERAL VASCULAR INTERVENTION;  Surgeon: Elam Dutch, MD;  Location: [place] [facility];  Service: Cardiovascular;  Laterality: Right;  common iliac  . TONSILLECTOMY AND ADENOIDECTOMY    . TOTAL KNEE REVISION Bilateral      Current Meds  Medication Sig  . amLODipine (NORVASC) 5 MG tablet Take 1 tablet (5 mg total) by mouth daily.  Marland Kitchen ascorbic acid (VITAMIN C) 500 MG/5ML syrup Take 500 mg by mouth daily.  Marland Kitchen atorvastatin (LIPITOR) 80 MG tablet TAKE 1 TABLET EACH EVENING  . Cholecalciferol (VITAMIN D3) 50 MCG (2000 UT) TABS Take 4,000 Units by mouth daily.  . cloNIDine (CATAPRES) 0.1 MG tablet Take 1 tablet (0.1 mg total) by mouth at bedtime.  . clopidogrel (PLAVIX) 75 MG tablet Take 75 mg by mouth daily.  Marland Kitchen doxazosin (CARDURA) 2 MG tablet Take 2 mg by mouth at bedtime.   . insulin glargine (LANTUS) 100 UNIT/ML injection Inject 20 Units into the skin daily.  . Iron Combinations (IRON COMPLEX PO) Take 65 mg by mouth daily.  Marland Kitchen lisinopril (ZESTRIL) 10 MG tablet TAKE 1 TABLET BY MOUTH EVERY DAY  . metFORMIN (GLUCOPHAGE) 500 MG tablet Take 500 mg by mouth 2 (two) times daily with a meal. TAKE 0.5 TABLET TWICE A DAY  . metoprolol tartrate (LOPRESSOR) 100 MG tablet TAKE 1 TABLET TWICE A DAY  . Multiple  Vitamins-Minerals (EYE VITAMINS PO) Take 1 tablet by mouth daily.  . nitroGLYCERIN (NITROSTAT) 0.4 MG SL tablet Place 1 tablet (0.4 mg total) under the tongue every 5 (five) minutes as needed for chest pain.  Marland Kitchen NOVOFINE 32G X 6 MM MISC daily. as directed  . omeprazole (PRILOSEC) 40 MG capsule Take 40 mg by mouth daily.  . Pancrelipase, Lip-Prot-Amyl, (CREON) 24000-76000 units CPEP Take 1 capsule by mouth in the morning, at noon, and at bedtime.     Allergies:   Patient has no known allergies.   Social History   Tobacco Use  . Smoking status: Former Smoker    Types: Cigarettes  . Smokeless tobacco: Never Used  Vaping Use  . Vaping Use: Never used  Substance Use Topics  . Alcohol use: No  . Drug use: No     Family Hx: The patient's family history includes Diabetes in his mother; Heart failure in his father and mother.  ROS:   Please see the history of present illness.     All other systems reviewed and are negative.   Prior CV studies:   The following studies were reviewed today:  Labs/Other Tests  and Data Reviewed:    EKG:  An ECG dated 03/30/2020 was personally reviewed today and demonstrated:  Sinus bradycardia 49 bpm first-degree AV block PR 0.250 seconds LVH voltage and minor nonspecific ST abnormality  Recent Labs: No results found for requested labs within last 8760 hours.   Recent Lipid Panel No results found for: CHOL, TRIG, HDL, CHOLHDL, LDLCALC, LDLDIRECT  Wt Readings from Last 3 Encounters:  08/14/20 165 lb (74.8 kg)  03/30/20 166 lb 12.8 oz (75.7 kg)  09/14/19 175 lb (79.4 kg)     Risk Assessment/Calculations:      Objective:    Vital Signs:  BP 131/60   Pulse (!) 54   Ht 5\' 11"  (1.803 m)   Wt 165 lb (74.8 kg)   BMI 23.01 kg/m    VITAL SIGNS:  reviewed  ASSESSMENT & PLAN:    1. CAD, stable continue medical therapy including clopidogrel beta-blocker and high intensity statin 2. Stable dyslipidemia continue statin I will leave this note open  until I see recent labs in his PCP office. 3. Hypertension poorly controlled she may be correct there is an element of anxiety in the evenings I will have her discuss that with her PCP and to optimize antihypertensive therapy transition from metoprolol weak effect to carvedilol strong antihypertensive agent. 4. Aortic stenosis when I see them in the office we will discuss an echocardiogram depending on physical findings 5. Heart failure is compensated he presently is not on a loop diuretic 6. Able sinus bradycardia she monitors heart rate with hold on metoprolol for heart rates below 50      COVID-19 Education: The signs and symptoms of COVID-19 were discussed with the patient and how to seek care for testing (follow up with PCP or arrange E-visit).  The importance of social distancing was discussed today.  Time:   Today, I have spent 25 minutes with the patient with telehealth technology discussing the above problems.     Medication Adjustments/Labs and Tests Ordered: Current medicines are reviewed at length with the patient today.  Concerns regarding medicines are outlined above.   Tests Ordered: No orders of the defined types were placed in this encounter.   Medication Changes: No orders of the defined types were placed in this encounter.   Follow Up:  In Person in 6 week(s)  Signed, Shirlee More, MD  08/14/2020 9:29 AM     [facility]

## 2020-08-14 ENCOUNTER — Telehealth (INDEPENDENT_AMBULATORY_CARE_PROVIDER_SITE_OTHER): Payer: Medicare Other | Admitting: Cardiology

## 2020-08-14 ENCOUNTER — Encounter: Payer: Self-pay | Admitting: Cardiology

## 2020-08-14 VITALS — BP 131/60 | HR 54 | Ht 71.0 in | Wt 165.0 lb

## 2020-08-14 DIAGNOSIS — I739 Peripheral vascular disease, unspecified: Secondary | ICD-10-CM

## 2020-08-14 DIAGNOSIS — I350 Nonrheumatic aortic (valve) stenosis: Secondary | ICD-10-CM

## 2020-08-14 DIAGNOSIS — I110 Hypertensive heart disease with heart failure: Secondary | ICD-10-CM | POA: Diagnosis not present

## 2020-08-14 DIAGNOSIS — I25119 Atherosclerotic heart disease of native coronary artery with unspecified angina pectoris: Secondary | ICD-10-CM | POA: Diagnosis not present

## 2020-08-14 DIAGNOSIS — R001 Bradycardia, unspecified: Secondary | ICD-10-CM | POA: Diagnosis not present

## 2020-08-14 DIAGNOSIS — E7800 Pure hypercholesterolemia, unspecified: Secondary | ICD-10-CM

## 2020-08-14 MED ORDER — CARVEDILOL 12.5 MG PO TABS: 12.5000 mg | ORAL_TABLET | Freq: Two times a day (BID) | ORAL | 3 refills | Status: DC

## 2020-08-14 NOTE — Addendum Note (Signed)
Addended by: Resa Miner I on: 08/14/2020 10:01 AM   Modules accepted: Orders

## 2020-08-14 NOTE — Patient Instructions (Signed)
Medication Instructions:  Your physician has recommended you make the following change in your medication:  STOP: Metoprolol  START: Carvedilol 12.5 mg take one tablet by mouth twice daily.  *If you need a refill on your cardiac medications before your next appointment, please call your pharmacy*   Lab Work: None If you have labs (blood work) drawn today and your tests are completely normal, you will receive your results only by: Marland Kitchen MyChart Message (if you have MyChart) OR . A paper copy in the mail If you have any lab test that is abnormal or we need to change your treatment, we will call you to review the results.   Testing/Procedures: None   Follow-Up: At [facility], you and your health needs are our priority.  As part of our continuing mission to provide you with exceptional heart care, we have created designated Provider Care Teams.  These Care Teams include your primary Cardiologist (physician) and Advanced Practice Providers (APPs -  Physician Assistants and Nurse Practitioners) who all work together to provide you with the care you need, when you need it.  We recommend signing up for the patient portal called "MyChart".  Sign up information is provided on this After Visit Summary.  MyChart is used to connect with patients for Virtual Visits (Telemedicine).  Patients are able to view lab/test results, encounter notes, upcoming appointments, etc.  Non-urgent messages can be sent to your provider as well.   To learn more about what you can do with MyChart, go to [email or web address].    Your next appointment:   6 week(s)  The format for your next appointment:   In Person  Provider:   Shirlee More, MD   Other Instructions

## 2020-08-17 ENCOUNTER — Other Ambulatory Visit: Payer: Self-pay | Admitting: Cardiology

## 2020-08-17 NOTE — Telephone Encounter (Signed)
I spoke w/ the patient wife Lelon Frohlich and she states the patient is not using [facility] to refill his meds. Pharmacy notified to d/c this med request.

## 2020-08-26 DIAGNOSIS — K219 Gastro-esophageal reflux disease without esophagitis: Secondary | ICD-10-CM | POA: Diagnosis not present

## 2020-08-26 DIAGNOSIS — E782 Mixed hyperlipidemia: Secondary | ICD-10-CM | POA: Diagnosis not present

## 2020-08-26 DIAGNOSIS — E1122 Type 2 diabetes mellitus with diabetic chronic kidney disease: Secondary | ICD-10-CM | POA: Diagnosis not present

## 2020-08-27 ENCOUNTER — Other Ambulatory Visit: Payer: Self-pay

## 2020-08-27 ENCOUNTER — Telehealth: Payer: Self-pay | Admitting: Cardiology

## 2020-08-27 MED ORDER — LISINOPRIL 10 MG PO TABS: 10.0000 mg | ORAL_TABLET | Freq: Every day | ORAL | 0 refills | Status: DC

## 2020-08-27 MED ORDER — LISINOPRIL 20 MG PO TABS: 20.0000 mg | ORAL_TABLET | Freq: Every day | ORAL | 3 refills | Status: DC

## 2020-08-27 NOTE — Telephone Encounter (Signed)
Spoke to [place] just now and she had a question in regards to the patients medications. She states that the patients blood pressures have still been elevated. She is wondering if she could increase his lisinopril to 20 mg daily as he is only on 10 mg daily at this time. She wants Dr. Joya Gaskins recommendation before making this increase.   Recent blood pressures are as follows:  20th - 156/69 21st - 184/77 22nd - 152/70 23rd - 160/62 24th - 161/65 25th - 182/77

## 2020-08-27 NOTE — Telephone Encounter (Signed)
Yes I would increase to 20 mg daily  Who is Courtnay?  Physician nurse practitioner nurse or pharmacist.

## 2020-08-27 NOTE — Addendum Note (Signed)
Addended by: Resa Miner I on: 08/27/2020 10:40 AM   Modules accepted: Orders

## 2020-08-27 NOTE — Telephone Encounter (Signed)
Spoke to [place] the Pharmacist at [facility] just now and let her know that this was fine with Dr. Bettina Gavia and I have sent the prescription in at this time.

## 2020-08-27 NOTE — Addendum Note (Signed)
Addended by: Resa Miner I on: 08/27/2020 10:35 AM   Modules accepted: Orders

## 2020-08-27 NOTE — Telephone Encounter (Signed)
New message:    Loma Sousa from [facility] need to speak with a nurse concerning one of the patient medication lisinopril. Please call [phone] ext [phone]

## 2020-09-10 ENCOUNTER — Telehealth: Payer: Self-pay | Admitting: Cardiology

## 2020-09-10 MED ORDER — CLONIDINE HCL 0.1 MG PO TABS: 0.1000 mg | ORAL_TABLET | Freq: Two times a day (BID) | ORAL | 3 refills | Status: DC

## 2020-09-10 NOTE — Telephone Encounter (Signed)
Called patient informed wife per dpr to have patient take clonidine 0.1 mg twice daily. Confirmed he was only taking once a day already. Scheduled patient for next week. They will call back if this doesn't help.

## 2020-09-10 NOTE — Telephone Encounter (Signed)
Pt c/o medication issue:  1. Name of Medication: cloNIDine (CATAPRES) 0.1 MG tablet, carvedilol (COREG) 12.5 MG tablet, lisinopril (ZESTRIL) 20 MG tablet  2. How are you currently taking this medication (dosage and times per day)? As directed  3. Are you having a reaction (difficulty breathing--STAT)? no  4. What is your medication issue? Per wife, patient was put on these new medications for his high BP but his wife states that it is not working. She is getting concerned about his BP. She says that it is spiking at night, around 220/90. She thought the patient had an appt with Dr. Bettina Gavia today but I don't see where an appointment for today was ever scheduled. Please advise.

## 2020-09-10 NOTE — Telephone Encounter (Signed)
Please confirm his dose of clonidine  If taking only at bedtime increase to twice daily.  Schedule in the office in the next 2 weeks.

## 2020-09-16 NOTE — Progress Notes (Signed)
Cardiology Office Note:    Date:  09/17/2020   ID:  Troy [name], DOB 06/19/1936, MRN [id number]  PCP:  Street, [name], MD  Cardiologist:  Shirlee More, MD    Referring MD: [address] [name], *    ASSESSMENT:    1. Hypertensive heart disease with heart failure ([place])   2. Coronary artery disease involving native coronary artery of native heart with angina pectoris ([place])   3. Sinus bradycardia on ECG   4. Mobitz type 1 second degree atrioventricular block   5. Nonrheumatic aortic valve stenosis    PLAN:    In order of problems listed above:  1. Overall his blood pressure is much better controlled since he transition from Toprol to carvedilol seems to tolerate it but I am unsure of these episodes of lightheadedness same Mobitz 1 second-degree second-degree AV block ordering apply a 7-day ZIO monitor to assess for bradycardia. 2. Stable CAD having no anginal discomfort we will continue current medical therapy including clopidogrel high intensity statin and antihypertensives including beta-blocker 3. Recheck echocardiogram regarding his aortic stenosis   Next appointment: 6 weeks   Medication Adjustments/Labs and Tests Ordered: Current medicines are reviewed at length with the patient today.  Concerns regarding medicines are outlined above.  Orders Placed This Encounter  Procedures  . LONG TERM MONITOR (3-14 DAYS)  . EKG 12-Lead  . ECHOCARDIOGRAM COMPLETE   No orders of the defined types were placed in this encounter.   Chief Complaint  Patient presents with  . Hypertension  . Follow-up  . Aortic Stenosis    History of Present Illness:    Troy [name] is a 85 y.o. male with  CAD, CHF, Dyslipidemia, HTN, AS, S/P PCI in 2001 and anemia.. Echo 04/26/18 shows EF 55-60%, , PAD with right common iliac artery stent February 2020, mild to mod MR, mild AS  and sinus bradycardia last seen 03/30/2020.  In response to a phone message 06/09/2020 clonidine was added for  hypertension control as his thiazide diuretic and hydralazine were withdrawn by another physician.  [place] pulmonary medicine for pulmonary nodules.  Biopsy showed necrotizing granuloma.  He was last seen in virtual visit 08/14/2020 hypertension remain poorly controlled and optimized treatment transition from metoprolol to carvedilol.  I was concerned about aortic stenosis with plans to do an outpatient echocardiogram history of mild aortic stenosis 2-1/2 years ago.   Echo 04/26/2018: - Left ventricle: The cavity size was normal. Systolic function was    normal. The estimated ejection fraction was in the range of 55%    to 60%. Wall motion was normal; there were no regional wall    motion abnormalities. Doppler parameters are consistent with    abnormal left ventricular relaxation (grade 1 diastolic    dysfunction).  - Aortic valve: Valve area (VTI): 1.75 cm^2. Valve area (Vmax):    1.79 cm^2. Valve area (Vmean): 1.54 cm^2.    Mean gradient (S): 12 mm Hg. Peak gradient (S): 21 mm Hg - Mitral valve: There was mild to moderate regurgitation.   [place] 03/06/2020: Creatinine 1.7 GFR 39 cc hemoglobin 9.2  Compliance with diet, lifestyle and medications: Yes  Troy [name] is seen along with his wife.  In the evenings his blood pressure in the range of 546E systolic prior to taking his medication.  His wife is a retired Marine scientist and blood pressures are in range heart rates above 50 but I am when I asked her to also  check his blood pressure 1 hour after the evening meals so we avoid overtreating hypertension.  He has intermittent episodes of dizziness in the office today had Mobitz 1 second-degree AV block wanted to go ahead and apply a 7-day monitor.  He also has an element aortic stenosis by exam appears to be moderate and will recheck echocardiogram we will continue his current antihypertensives and follow-up in 6 weeks.  If not having syncope palpitation angina edema or  shortness of breath.  His pulse was 50 bpm and felt to have irregularity and and EKG has been performed, his EKG today shows occasional dropped beats with a Mobitz 1 second-degree heart block pattern and his heart rate varies in the range of 50 to 55 bpm. Past Medical History:  Diagnosis Date  . Anemia 12/09/2015  . CAD in native artery 12/09/2015   Overview:  PCI/ stent 2001  . Chronic diastolic heart failure ([place]) 12/09/2015  . Coronary artery disease involving native coronary artery of native heart with angina pectoris ([place]) 12/09/2015   Overview:  PCI/ stent 2001  . Hyperlipidemia 12/09/2015  . Hypertensive heart disease with heart failure ([place]) 12/09/2015  . Necrotizing pancreatitis 06/26/2015  . Pseudocyst of pancreas 06/26/2015    Past Surgical History:  Procedure Laterality Date  . ABDOMINAL AORTOGRAM W/LOWER EXTREMITY Bilateral 09/10/2018   Procedure: ABDOMINAL AORTOGRAM W/LOWER EXTREMITY;  Surgeon: Elam Dutch, MD;  Location: [place] [facility];  Service: Cardiovascular;  Laterality: Bilateral;  . CHOLECYSTECTOMY    . PERIPHERAL VASCULAR INTERVENTION Right 09/10/2018   Procedure: PERIPHERAL VASCULAR INTERVENTION;  Surgeon: Elam Dutch, MD;  Location: [place] [facility];  Service: Cardiovascular;  Laterality: Right;  common iliac  . TONSILLECTOMY AND ADENOIDECTOMY    . TOTAL KNEE REVISION Bilateral     Current Medications: Current Meds  Medication Sig  . amLODipine (NORVASC) 5 MG tablet Take 1 tablet (5 mg total) by mouth daily.  Marland Kitchen ascorbic acid (VITAMIN C) 500 MG/5ML syrup Take 500 mg by mouth daily.  Marland Kitchen atorvastatin (LIPITOR) 80 MG tablet TAKE 1 TABLET EACH EVENING  . carvedilol (COREG) 12.5 MG tablet Take 1 tablet (12.5 mg total) by mouth 2 (two) times daily.  . Cholecalciferol (VITAMIN D3) 50 MCG (2000 UT) TABS Take 4,000 Units by mouth daily.  . cloNIDine (CATAPRES) 0.1 MG tablet Take 1 tablet (0.1 mg total) by mouth 2 (two) times daily.  . clopidogrel (PLAVIX)  75 MG tablet Take 75 mg by mouth daily.  Marland Kitchen doxazosin (CARDURA) 2 MG tablet Take 2 mg by mouth at bedtime.   . insulin glargine (LANTUS) 100 UNIT/ML injection Inject 20 Units into the skin daily.  . Iron Combinations (IRON COMPLEX PO) Take 65 mg by mouth daily.  Marland Kitchen lisinopril (ZESTRIL) 20 MG tablet Take 1 tablet (20 mg total) by mouth daily.  . metFORMIN (GLUCOPHAGE-XR) 500 MG 24 hr tablet Take 500 mg by mouth every morning.  . Multiple Vitamins-Minerals (EYE VITAMINS PO) Take 1 tablet by mouth daily.  . nitroGLYCERIN (NITROSTAT) 0.4 MG SL tablet Place 1 tablet (0.4 mg total) under the tongue every 5 (five) minutes as needed for chest pain.  Marland Kitchen NOVOFINE 32G X 6 MM MISC daily. as directed  . omeprazole (PRILOSEC) 40 MG capsule Take 40 mg by mouth daily.  . Pancrelipase, Lip-Prot-Amyl, (CREON) 24000-76000 units CPEP Take 1 capsule by mouth in the morning, at noon, and at bedtime.     Allergies:   Patient has no known allergies.   Social  History   Socioeconomic History  . Marital status: Married    Spouse name: Not on file  . Number of children: Not on file  . Years of education: Not on file  . Highest education level: Not on file  Occupational History  . Not on file  Tobacco Use  . Smoking status: Former Smoker    Types: Cigarettes  . Smokeless tobacco: Never Used  Vaping Use  . Vaping Use: Never used  Substance and Sexual Activity  . Alcohol use: No  . Drug use: No  . Sexual activity: Not on file  Other Topics Concern  . Not on file  Social History Narrative  . Not on file   Social Determinants of Health   Financial Resource Strain: Not on file  Food Insecurity: Not on file  Transportation Needs: Not on file  Physical Activity: Not on file  Stress: Not on file  Social Connections: Not on file     Family History: The patient's family history includes Diabetes in his mother; Heart failure in his father and mother. ROS:   Please see the history of present illness.     All other systems reviewed and are negative.  EKGs/Labs/Other Studies Reviewed:    The following studies were reviewed today:  EKG:  EKG ordered today and personally reviewed.  The ekg ordered today demonstrates sinus rhythm first-degree AV block and we captured having Mobitz 1 second-degree AV block without long pauses.    Physical Exam:    VS:  BP (!) 148/60 (BP Location: Left Arm, Patient Position: Sitting, Cuff Size: Normal)   Ht 5\' 11"  (1.803 m)   Wt 175 lb (79.4 kg)   SpO2 99%   BMI 24.41 kg/m     Wt Readings from Last 3 Encounters:  09/17/20 175 lb (79.4 kg)  08/14/20 165 lb (74.8 kg)  03/30/20 166 lb 12.8 oz (75.7 kg)     GEN: He looks frail well nourished, well developed in no acute distress HEENT: Normal NECK: No JVD; No carotid bruits LYMPHATICS: No lymphadenopathy CARDIAC: Grade 2/6 aortic stenosis S2 is not single its not holosystolic its midsystolic and radiates to the right clavicular area not to the carotids RRR,  RESPIRATORY:  Clear to auscultation without rales, wheezing or rhonchi  ABDOMEN: Soft, non-tender, non-distended MUSCULOSKELETAL:  No edema; No deformity  SKIN: Warm and dry NEUROLOGIC:  Alert and oriented x 3 PSYCHIATRIC:  Normal affect    Signed, Shirlee More, MD  09/17/2020 10:03 AM    [place]

## 2020-09-17 ENCOUNTER — Other Ambulatory Visit: Payer: Self-pay

## 2020-09-17 ENCOUNTER — Ambulatory Visit (INDEPENDENT_AMBULATORY_CARE_PROVIDER_SITE_OTHER): Payer: Medicare Other | Admitting: Cardiology

## 2020-09-17 ENCOUNTER — Encounter: Payer: Self-pay | Admitting: Cardiology

## 2020-09-17 ENCOUNTER — Ambulatory Visit (INDEPENDENT_AMBULATORY_CARE_PROVIDER_SITE_OTHER): Payer: Medicare Other

## 2020-09-17 VITALS — BP 148/60 | Ht 71.0 in | Wt 175.0 lb

## 2020-09-17 DIAGNOSIS — R001 Bradycardia, unspecified: Secondary | ICD-10-CM

## 2020-09-17 DIAGNOSIS — I25119 Atherosclerotic heart disease of native coronary artery with unspecified angina pectoris: Secondary | ICD-10-CM | POA: Diagnosis not present

## 2020-09-17 DIAGNOSIS — I110 Hypertensive heart disease with heart failure: Secondary | ICD-10-CM

## 2020-09-17 DIAGNOSIS — I350 Nonrheumatic aortic (valve) stenosis: Secondary | ICD-10-CM

## 2020-09-17 DIAGNOSIS — I441 Atrioventricular block, second degree: Secondary | ICD-10-CM | POA: Diagnosis not present

## 2020-09-17 NOTE — Patient Instructions (Signed)
Medication Instructions:  Your physician recommends that you continue on your current medications as directed. Please refer to the Current Medication list given to you today.  *If you need a refill on your cardiac medications before your next appointment, please call your pharmacy*   Lab Work: None If you have labs (blood work) drawn today and your tests are completely normal, you will receive your results only by: Marland Kitchen MyChart Message (if you have MyChart) OR . A paper copy in the mail If you have any lab test that is abnormal or we need to change your treatment, we will call you to review the results.   Testing/Procedures: Your physician has requested that you have an echocardiogram. Echocardiography is a painless test that uses sound waves to create images of your heart. It provides your doctor with information about the size and shape of your heart and how well your heart's chambers and valves are working. This procedure takes approximately one hour. There are no restrictions for this procedure.  A zio monitor was ordered today. It will remain on for 7 days. You will then return monitor and event diary in provided box. It takes 1-2 weeks for report to be downloaded and returned to [place]. We will call you with the results. If monitor falls off or has orange flashing light, please call Zio for further instructions.      Follow-Up: At [facility], you and your health needs are our priority.  As part of our continuing mission to provide you with exceptional heart care, we have created designated Provider Care Teams.  These Care Teams include your primary Cardiologist (physician) and Advanced Practice Providers (APPs -  Physician Assistants and Nurse Practitioners) who all work together to provide you with the care you need, when you need it.  We recommend signing up for the patient portal called "MyChart".  Sign up information is provided on this After Visit Summary.  MyChart is used to connect  with patients for Virtual Visits (Telemedicine).  Patients are able to view lab/test results, encounter notes, upcoming appointments, etc.  Non-urgent messages can be sent to your provider as well.   To learn more about what you can do with MyChart, go to [email or web address].    Your next appointment:   4 week(s)  The format for your next appointment:   In Person  Provider:   Shirlee More, MD   Other Instructions

## 2020-09-24 DIAGNOSIS — E7849 Other hyperlipidemia: Secondary | ICD-10-CM | POA: Diagnosis not present

## 2020-09-24 DIAGNOSIS — E1122 Type 2 diabetes mellitus with diabetic chronic kidney disease: Secondary | ICD-10-CM | POA: Diagnosis not present

## 2020-09-24 DIAGNOSIS — R001 Bradycardia, unspecified: Secondary | ICD-10-CM | POA: Diagnosis not present

## 2020-09-24 DIAGNOSIS — N1832 Chronic kidney disease, stage 3b: Secondary | ICD-10-CM | POA: Diagnosis not present

## 2020-09-24 DIAGNOSIS — K219 Gastro-esophageal reflux disease without esophagitis: Secondary | ICD-10-CM | POA: Diagnosis not present

## 2020-09-26 DIAGNOSIS — H353231 Exudative age-related macular degeneration, bilateral, with active choroidal neovascularization: Secondary | ICD-10-CM | POA: Diagnosis not present

## 2020-09-27 ENCOUNTER — Encounter: Payer: Self-pay | Admitting: Podiatry

## 2020-09-27 ENCOUNTER — Ambulatory Visit (INDEPENDENT_AMBULATORY_CARE_PROVIDER_SITE_OTHER): Payer: Medicare Other | Admitting: Podiatry

## 2020-09-27 ENCOUNTER — Other Ambulatory Visit: Payer: Self-pay

## 2020-09-27 DIAGNOSIS — B351 Tinea unguium: Secondary | ICD-10-CM | POA: Diagnosis not present

## 2020-09-27 DIAGNOSIS — E1140 Type 2 diabetes mellitus with diabetic neuropathy, unspecified: Secondary | ICD-10-CM | POA: Diagnosis not present

## 2020-09-27 DIAGNOSIS — M79675 Pain in left toe(s): Secondary | ICD-10-CM

## 2020-09-27 DIAGNOSIS — M79674 Pain in right toe(s): Secondary | ICD-10-CM | POA: Diagnosis not present

## 2020-09-27 NOTE — Progress Notes (Signed)
  Subjective:  Patient ID: Troy [name], male    DOB: 24-Apr-1936,  MRN: [id number]  85 y.o. male presents with painful thick toenails that are difficult to trim. Pain interferes with ambulation. Aggravating factors include wearing enclosed shoe gear. Pain is relieved with periodic professional debridement..    Patient's blood sugar was 130 mg/dl this morning.  His wife is present during today's visit. She drives as he no longer drives due to macular degeneration.  PCP: Street, [name], MD and last visit was: two months ago  Review of Systems: Negative except as noted in the HPI.   No Known Allergies  Objective:  There were no vitals filed for this visit. Constitutional Patient is a pleasant 85 y.o. Caucasian male WD, WN in NAD. AAO x 3.  Vascular Capillary refill time to digits <4 seconds b/l lower extremities. Faintly palpable DP pulse(s) b/l lower extremities. Faintly palpable PT pulse(s) b/l lower extremities. Pedal hair sparse. Lower extremity skin temperature gradient within normal limits. No pain with calf compression b/l. Varicosities present b/l. No cyanosis or clubbing noted.  Neurologic Normal speech. Protective sensation diminished with 10g monofilament b/l.  Dermatologic Pedal skin with normal turgor, texture and tone bilaterally. No open wounds bilaterally. No interdigital macerations bilaterally. Toenails 1-5 b/l elongated, discolored, dystrophic, thickened, crumbly with subungual debris and tenderness to dorsal palpation. Subungual hematoma noted of the left 2nd digit. Appears to be subacute with dark heme under the toenail. Nailplate remains adhered. No erythema, no edema, no active drainage, no flocculence, no fluid expressed when pressure applied to digit.  Orthopedic: Normal muscle strength 5/5 to all lower extremity muscle groups bilaterally. No pain crepitus or joint limitation noted with ROM b/l. Utilizes cane for ambulation assistance.   Assessment:   1. Pain  due to onychomycosis of toenails of both feet   2. Type 2 diabetes mellitus with diabetic neuropathy, unspecified whether long term insulin use ([place])    Plan:  Patient was evaluated and treated and all questions answered.  Onychomycosis with pain -Nails palliatively debridement as below. -Educated on self-care  Procedure: Nail Debridement Rationale: Pain Type of Debridement: manual, sharp debridement. Instrumentation: Nail nipper, rotary burr. Number of Nails: 10  -Examined patient. -No new findings. No new orders. -Continue diabetic foot care principles. -Patient to continue soft, supportive shoe gear daily. -Toenails 1-5 b/l were debrided in length and girth with sterile nail nippers and dremel without iatrogenic bleeding.  -Old subungual hematoma stable. Monitor for now. Call if he has any problems. -Patient to report any pedal injuries to medical professional immediately. -Patient/POA to call should there be question/concern in the interim.  Return in about 3 months (around 12/28/2020).  Marzetta Board, DPM

## 2020-10-01 ENCOUNTER — Telehealth: Payer: Self-pay | Admitting: Cardiology

## 2020-10-01 ENCOUNTER — Emergency Department (HOSPITAL_COMMUNITY): Payer: Medicare Other

## 2020-10-01 ENCOUNTER — Encounter (HOSPITAL_COMMUNITY): Payer: Self-pay | Admitting: Emergency Medicine

## 2020-10-01 ENCOUNTER — Observation Stay (HOSPITAL_COMMUNITY)
Admission: EM | Admit: 2020-10-01 | Discharge: 2020-10-02 | Disposition: A | Discharge status: Home / Self Care | Payer: Medicare Other | Attending: Cardiovascular Disease | Admitting: Cardiovascular Disease

## 2020-10-01 ENCOUNTER — Other Ambulatory Visit: Payer: Self-pay

## 2020-10-01 DIAGNOSIS — R001 Bradycardia, unspecified: Secondary | ICD-10-CM | POA: Diagnosis not present

## 2020-10-01 DIAGNOSIS — R002 Palpitations: Secondary | ICD-10-CM

## 2020-10-01 DIAGNOSIS — Z79899 Other long term (current) drug therapy: Secondary | ICD-10-CM | POA: Diagnosis not present

## 2020-10-01 DIAGNOSIS — I110 Hypertensive heart disease with heart failure: Secondary | ICD-10-CM | POA: Insufficient documentation

## 2020-10-01 DIAGNOSIS — Z20822 Contact with and (suspected) exposure to covid-19: Secondary | ICD-10-CM | POA: Insufficient documentation

## 2020-10-01 DIAGNOSIS — Z87891 Personal history of nicotine dependence: Secondary | ICD-10-CM | POA: Insufficient documentation

## 2020-10-01 DIAGNOSIS — R0789 Other chest pain: Secondary | ICD-10-CM | POA: Diagnosis not present

## 2020-10-01 DIAGNOSIS — I5032 Chronic diastolic (congestive) heart failure: Secondary | ICD-10-CM | POA: Insufficient documentation

## 2020-10-01 DIAGNOSIS — Z7902 Long term (current) use of antithrombotics/antiplatelets: Secondary | ICD-10-CM | POA: Diagnosis not present

## 2020-10-01 DIAGNOSIS — E1122 Type 2 diabetes mellitus with diabetic chronic kidney disease: Secondary | ICD-10-CM | POA: Diagnosis not present

## 2020-10-01 DIAGNOSIS — J439 Emphysema, unspecified: Secondary | ICD-10-CM | POA: Diagnosis not present

## 2020-10-01 DIAGNOSIS — I441 Atrioventricular block, second degree: Secondary | ICD-10-CM | POA: Diagnosis not present

## 2020-10-01 DIAGNOSIS — I2510 Atherosclerotic heart disease of native coronary artery without angina pectoris: Secondary | ICD-10-CM | POA: Diagnosis not present

## 2020-10-01 DIAGNOSIS — I10 Essential (primary) hypertension: Secondary | ICD-10-CM

## 2020-10-01 DIAGNOSIS — R079 Chest pain, unspecified: Secondary | ICD-10-CM | POA: Diagnosis not present

## 2020-10-01 HISTORY — DX: Bradycardia, unspecified: R00.1

## 2020-10-01 LAB — CBC
HCT: 29.7 % — ABNORMAL LOW (ref 39.0–52.0)
Hemoglobin: 10 g/dL — ABNORMAL LOW (ref 13.0–17.0)
MCH: 30 pg (ref 26.0–34.0)
MCHC: 33.7 g/dL (ref 30.0–36.0)
MCV: 89.2 fL (ref 80.0–100.0)
Platelets: 291 10*3/uL (ref 150–400)
RBC: 3.33 MIL/uL — ABNORMAL LOW (ref 4.22–5.81)
RDW: 13.5 % (ref 11.5–15.5)
WBC: 9.6 10*3/uL (ref 4.0–10.5)
nRBC: 0 % (ref 0.0–0.2)

## 2020-10-01 LAB — TROPONIN I (HIGH SENSITIVITY)
Troponin I (High Sensitivity): 14 ng/L (ref <18)
Troponin I (High Sensitivity): 14 ng/L (ref <18)

## 2020-10-01 LAB — BASIC METABOLIC PANEL
Anion gap: 10 (ref 5–15)
BUN: 42 mg/dL — ABNORMAL HIGH (ref 8–23)
CO2: 21 mmol/L — ABNORMAL LOW (ref 22–32)
Calcium: 9 mg/dL (ref 8.9–10.3)
Chloride: 109 mmol/L (ref 98–111)
Creatinine, Ser: 2.12 mg/dL — ABNORMAL HIGH (ref 0.61–1.24)
GFR, Estimated: 30 mL/min — ABNORMAL LOW (ref >60)
Glucose, Bld: 64 mg/dL — ABNORMAL LOW (ref 70–99)
Potassium: 4.1 mmol/L (ref 3.5–5.1)
Sodium: 140 mmol/L (ref 135–145)

## 2020-10-01 MED ORDER — INSULIN GLARGINE 100 UNIT/ML ~~LOC~~ SOLN
20.0000 [IU] | Freq: Every day | SUBCUTANEOUS | Status: DC
  Filled 2020-10-01 (×3): qty 0.2

## 2020-10-01 MED ORDER — ONDANSETRON HCL 4 MG/2ML IJ SOLN: 4.0000 mg | Freq: Four times a day (QID) | INTRAMUSCULAR | Status: DC | PRN

## 2020-10-01 MED ORDER — AMLODIPINE BESYLATE 5 MG PO TABS
5.0000 mg | ORAL_TABLET | Freq: Every day | ORAL | Status: DC
  Administered 2020-10-02: 5 mg via ORAL
  Filled 2020-10-01: qty 1

## 2020-10-01 MED ORDER — HYDRALAZINE HCL 25 MG PO TABS
25.0000 mg | ORAL_TABLET | Freq: Three times a day (TID) | ORAL | Status: DC
  Administered 2020-10-01 – 2020-10-02 (×3): 25 mg via ORAL
  Filled 2020-10-01 (×3): qty 1

## 2020-10-01 MED ORDER — ACETAMINOPHEN 325 MG PO TABS: 650.0000 mg | ORAL_TABLET | ORAL | Status: DC | PRN

## 2020-10-01 MED ORDER — DOXAZOSIN MESYLATE 2 MG PO TABS
2.0000 mg | ORAL_TABLET | Freq: Every day | ORAL | Status: DC
  Administered 2020-10-01: 2 mg via ORAL
  Filled 2020-10-01: qty 1

## 2020-10-01 MED ORDER — ENOXAPARIN SODIUM 30 MG/0.3ML ~~LOC~~ SOLN
30.0000 mg | SUBCUTANEOUS | Status: DC
  Administered 2020-10-01: 30 mg via SUBCUTANEOUS
  Filled 2020-10-01: qty 0.3

## 2020-10-01 MED ORDER — ATORVASTATIN CALCIUM 80 MG PO TABS
80.0000 mg | ORAL_TABLET | Freq: Every day | ORAL | Status: DC
  Administered 2020-10-01: 80 mg via ORAL
  Filled 2020-10-01: qty 1

## 2020-10-01 MED ORDER — LISINOPRIL 10 MG PO TABS
20.0000 mg | ORAL_TABLET | Freq: Every day | ORAL | Status: DC
  Administered 2020-10-02: 20 mg via ORAL
  Filled 2020-10-01: qty 2
  Filled 2020-10-01: qty 1

## 2020-10-01 MED ORDER — METFORMIN HCL ER 500 MG PO TB24
500.0000 mg | ORAL_TABLET | Freq: Every day | ORAL | Status: DC
  Filled 2020-10-01: qty 1

## 2020-10-01 MED ORDER — NITROGLYCERIN 0.4 MG SL SUBL: 0.4000 mg | SUBLINGUAL_TABLET | SUBLINGUAL | Status: DC | PRN

## 2020-10-01 MED ORDER — CLOPIDOGREL BISULFATE 75 MG PO TABS: 75.0000 mg | ORAL_TABLET | Freq: Every day | ORAL | Status: DC

## 2020-10-01 NOTE — ED Provider Notes (Signed)
[place] EMERGENCY DEPARTMENT Provider Note   CSN: [id number] Arrival date & time: 10/01/20  1750     History Chief Complaint  Patient presents with  . Chest Pain    Troy [name] is a 85 y.o. male.  Patient presents to ER chief complaint of lightheadedness and dizziness associate with chest pain.  Symptoms been going off and on for the past week.  He recently had a cardiac monitor placed and had it removed about 10 days ago.  He had it evaluated by his cardiologist who thought there were episodes of third-degree block that were intermittent.  He was sent to the ER for further monitoring and evaluation.  Patient complaining also of intermittent chest pain lasting several minutes at a time nonradiating mid chest.  Denies fevers cough vomiting or diarrhea otherwise.        Past Medical History:  Diagnosis Date  . Anemia 12/09/2015  . CAD in native artery 12/09/2015   Overview:  PCI/ stent 2001  . Chronic diastolic heart failure ([place]) 12/09/2015  . Coronary artery disease involving native coronary artery of native heart with angina pectoris ([place]) 12/09/2015   Overview:  PCI/ stent 2001  . Hyperlipidemia 12/09/2015  . Hypertensive heart disease with heart failure ([place]) 12/09/2015  . Necrotizing pancreatitis 06/26/2015  . Pseudocyst of pancreas 06/26/2015    Patient Active Problem List   Diagnosis Date Noted  . Anemia 12/09/2015  . Coronary artery disease involving native coronary artery of native heart with angina pectoris ([place]) 12/09/2015  . Chronic diastolic heart failure ([place]) 12/09/2015  . Hyperlipidemia 12/09/2015  . Hypertensive heart disease with heart failure ([place]) 12/09/2015  . CAD in native artery 12/09/2015  . Necrotizing pancreatitis 06/26/2015  . Pseudocyst of pancreas 06/26/2015    Past Surgical History:  Procedure Laterality Date  . ABDOMINAL AORTOGRAM W/LOWER EXTREMITY Bilateral 09/10/2018   Procedure: ABDOMINAL AORTOGRAM W/LOWER  EXTREMITY;  Surgeon: Elam Dutch, MD;  Location: [facility];  Service: Cardiovascular;  Laterality: Bilateral;  . CHOLECYSTECTOMY    . PERIPHERAL VASCULAR INTERVENTION Right 09/10/2018   Procedure: PERIPHERAL VASCULAR INTERVENTION;  Surgeon: Elam Dutch, MD;  Location: [facility];  Service: Cardiovascular;  Laterality: Right;  common iliac  . TONSILLECTOMY AND ADENOIDECTOMY    . TOTAL KNEE REVISION Bilateral        Family History  Problem Relation Age of Onset  . Heart failure Mother   . Diabetes Mother   . Heart failure Father     Social History   Tobacco Use  . Smoking status: Former Smoker    Types: Cigarettes  . Smokeless tobacco: Never Used  Vaping Use  . Vaping Use: Never used  Substance Use Topics  . Alcohol use: No  . Drug use: No    Home Medications Prior to Admission medications   Medication Sig Start Date End Date Taking? Authorizing Provider  amLODipine (NORVASC) 5 MG tablet Take 1 tablet (5 mg total) by mouth daily. 06/18/20   Richardo Priest, MD  ascorbic acid (VITAMIN C) 500 MG/5ML syrup Take 500 mg by mouth daily.    [provider]  atorvastatin (LIPITOR) 80 MG tablet TAKE 1 TABLET EACH EVENING 08/29/19   Richardo Priest, MD  carvedilol (COREG) 12.5 MG tablet Take 1 tablet (12.5 mg total) by mouth 2 (two) times daily. 08/14/20 11/12/20  Richardo Priest, MD  Cholecalciferol (VITAMIN D3) 50 MCG (2000 UT) TABS Take 4,000 Units by mouth  daily.    [provider]  cloNIDine (CATAPRES) 0.1 MG tablet Take 1 tablet (0.1 mg total) by mouth 2 (two) times daily. 09/10/20   Richardo Priest, MD  clopidogrel (PLAVIX) 75 MG tablet Take 75 mg by mouth daily.    [provider]  doxazosin (CARDURA) 2 MG tablet Take 2 mg by mouth at bedtime.     [provider]  insulin glargine (LANTUS) 100 UNIT/ML injection Inject 20 Units into the skin daily.    [provider]  Iron Combinations (IRON COMPLEX PO) Take 65  mg by mouth daily.    [provider]  lisinopril (ZESTRIL) 20 MG tablet Take 1 tablet (20 mg total) by mouth daily. 08/27/20 11/25/20  Richardo Priest, MD  metFORMIN (GLUCOPHAGE-XR) 500 MG 24 hr tablet Take 500 mg by mouth every morning. 08/09/20   [provider]  Multiple Vitamins-Minerals (EYE VITAMINS PO) Take 1 tablet by mouth daily.    [provider]  nitroGLYCERIN (NITROSTAT) 0.4 MG SL tablet Place 1 tablet (0.4 mg total) under the tongue every 5 (five) minutes as needed for chest pain. 04/19/19   Richardo Priest, MD  NOVOFINE 32G X 6 MM MISC daily. as directed 09/02/19   [provider]  omeprazole (PRILOSEC) 40 MG capsule Take 40 mg by mouth daily. 09/02/19   [provider]  Pancrelipase, Lip-Prot-Amyl, (CREON) 24000-76000 units CPEP Take 1 capsule by mouth in the morning, at noon, and at bedtime. 12/16/19   [provider]    Allergies    Patient has no known allergies.  Review of Systems   Review of Systems  Constitutional: Negative for fever.  HENT: Negative for ear pain and sore throat.   Eyes: Negative for pain.  Respiratory: Negative for cough.   Cardiovascular: Positive for chest pain.  Gastrointestinal: Negative for abdominal pain.  Genitourinary: Negative for flank pain.  Musculoskeletal: Negative for back pain.  Skin: Negative for color change and rash.  Neurological: Negative for syncope.  All other systems reviewed and are negative.   Physical Exam Updated Vital Signs BP (!) 181/93   Pulse 75   Temp 98.1 F (36.7 C) (Oral)   Resp 16   SpO2 100%   Physical Exam Constitutional:      General: He is not in acute distress.    Appearance: He is well-developed.  HENT:     Head: Normocephalic.     Nose: Nose normal.  Eyes:     Extraocular Movements: Extraocular movements intact.  Cardiovascular:     Rate and Rhythm: Normal rate.  Pulmonary:     Effort: Pulmonary effort is normal.  Skin:    Coloration: Skin  is not jaundiced.  Neurological:     Mental Status: He is alert. Mental status is at baseline.     ED Results / Procedures / Treatments   Labs (all labs ordered are listed, but only abnormal results are displayed) Labs Reviewed  BASIC METABOLIC PANEL - Abnormal; Notable for the following components:      Result Value   CO2 21 (*)    Glucose, Bld 64 (*)    BUN 42 (*)    Creatinine, Ser 2.12 (*)    GFR, Estimated 30 (*)    All other components within normal limits  CBC - Abnormal; Notable for the following components:   RBC 3.33 (*)    Hemoglobin 10.0 (*)    HCT 29.7 (*)    All other components  within normal limits  SARS CORONAVIRUS 2 (TAT 6-24 HRS)  TROPONIN I (HIGH SENSITIVITY)  TROPONIN I (HIGH SENSITIVITY)    EKG EKG Interpretation  Date/Time:  Monday October 01 2020 18:46:57 EST Ventricular Rate:  72 PR Interval:  244 QRS Duration: 86 QT Interval:  370 QTC Calculation: 405 R Axis:   79 Text Interpretation: Sinus rhythm with 1st degree A-V block Left ventricular hypertrophy with repolarization abnormality ( Sokolow-Lyon ) Abnormal ECG Confirmed by Thamas Jaegers ([id number]) on 10/01/2020 9:11:15 PM   Radiology DG Chest 2 View  Result Date: 10/01/2020 CLINICAL DATA:  Chest pain, bradycardia EXAM: CHEST - 2 VIEW COMPARISON:  03/06/2020 FINDINGS: Frontal and lateral views of the chest demonstrate a stable cardiac silhouette. Chronic areas of scarring and hyperinflation are seen compatible with emphysema. Chronic consolidation is seen at the right apex, please refer to previous biopsy results 03/06/2020. No acute airspace disease, effusion, or pneumothorax. No acute bony abnormalities. IMPRESSION: 1. Chronic nodular consolidation at the right apex, please refer to previous biopsy results 03/06/2020. 2. Stable emphysema. 3. No new airspace disease. Electronically Signed   By: Randa Ngo M.D.   On: 10/01/2020 20:09    Procedures Procedures   Medications Ordered in  ED Medications - No data to display  ED Course  I have reviewed the triage vital signs and the nursing notes.  Pertinent labs & imaging results that were available during my care of the patient were reviewed by me and considered in my medical decision making (see chart for details).    MDM Rules/Calculators/A&P                          EKG shows sinus rhythm.  Labs otherwise unremarkable, troponin is negative.  Case discussed with cardiologist on-call who admit the patient for further observation.   Final Clinical Impression(s) / ED Diagnoses Final diagnoses:  Chest pain, unspecified type  Palpitations    Rx / DC Orders ED Discharge Orders    None       Luna Fuse, MD 10/01/20 2116

## 2020-10-01 NOTE — ED Triage Notes (Signed)
Pt. Stated, Troy [name] had a heart monitor and said to come here My heart is too slow.

## 2020-10-01 NOTE — H&P (Signed)
Cardiology Admission History and Physical:   Patient ID: Troy [name]; MRN: [id number]; DOB: 1936-04-07   Admission date: 10/01/2020  Primary Care Provider: Street, [name], MD Primary Cardiologist:  Shirlee More, MD     Chief Complaint:  Dizziness  History of Present Illness:   Troy [name] is a 85 y.o. male with a history of CAD (PCI in 2001), sinus bradycardia, HFpEF, HTN, HL, PAD (Rt CIA stent 08/2018) and anemia, who presented to the hospital with complaints of intermittent lightheadedness. He had a ZIO patch placement and apparently this documented episodes of complete heart block (correlating with symptoms). The patient denies any shortness of breath, palpitations or syncope. He has had chest pain usually lasting for hours. The pain is relieved with NTG. He has had elevated BPs at home. He attempts to walk everyday on a treadmill for a mile. He was called at home to come to the hospital for monitoring and further management.   He has had difficult to control hypertension. He was placed on carvedilol by his cardiologist (Dr Bettina Gavia) and the metoprolol was stopped. Also on amlodipine, clonidine, doxazosin, and lisinopril. ECG in the office documented Mobitz 1 second-degree AV block, rate in the 50s.  In the ED the vital signs were: BP 195/56, HR 74 bpm and O2sat 99%. Pertinent labs were: K 4.1, gluc 64, BUN 42, Cr 2.12, Hgb 10, Hct 29.7, Plt 291 and hs-Trop 14. CXR showed stable emphysema. The ECG that was done 10/01/2020 (18:46) demonstrated sinus rhythm with 1st degree AVB, LVH and repol abnormalities.   Echo 04/26/2018: - Left ventricle: The cavity size was normal. Systolic function was  normal. The estimated ejection fraction was in the range of 55%  to 60%. Wall motion was normal; there were no regional wall  motion abnormalities. Doppler parameters are consistent with  abnormal left ventricular relaxation (grade 1 diastolic  dysfunction).  - Aortic valve: Valve  area (VTI): 1.75 cm^2. Valve area (Vmax):  1.79 cm^2. Valve area (Vmean): 1.54 cm^2.  Mean gradient (S): 12 mm Hg. Peak gradient (S): 21 mm Hg - Mitral valve: There was mild to moderate regurgitation.    Past Medical History:  Diagnosis Date  . Anemia 12/09/2015  . CAD in native artery 12/09/2015   Overview:  PCI/ stent 2001  . Chronic diastolic heart failure ([place]) 12/09/2015  . Coronary artery disease involving native coronary artery of native heart with angina pectoris ([place]) 12/09/2015   Overview:  PCI/ stent 2001  . Hyperlipidemia 12/09/2015  . Hypertensive heart disease with heart failure ([place]) 12/09/2015  . Necrotizing pancreatitis 06/26/2015  . Pseudocyst of pancreas 06/26/2015    Past Surgical History:  Procedure Laterality Date  . ABDOMINAL AORTOGRAM W/LOWER EXTREMITY Bilateral 09/10/2018   Procedure: ABDOMINAL AORTOGRAM W/LOWER EXTREMITY;  Surgeon: Elam Dutch, MD;  Location: [facility];  Service: Cardiovascular;  Laterality: Bilateral;  . CHOLECYSTECTOMY    . PERIPHERAL VASCULAR INTERVENTION Right 09/10/2018   Procedure: PERIPHERAL VASCULAR INTERVENTION;  Surgeon: Elam Dutch, MD;  Location: [facility];  Service: Cardiovascular;  Laterality: Right;  common iliac  . TONSILLECTOMY AND ADENOIDECTOMY    . TOTAL KNEE REVISION Bilateral      Medications Prior to Admission: Prior to Admission medications   Medication Sig Start Date End Date Taking? Authorizing Provider  amLODipine (NORVASC) 5 MG tablet Take 1 tablet (5 mg total) by mouth daily. 06/18/20   Richardo Priest, MD  ascorbic acid (VITAMIN C) 500 MG/5ML syrup  Take 500 mg by mouth daily.    [provider]  atorvastatin (LIPITOR) 80 MG tablet TAKE 1 TABLET EACH EVENING 08/29/19   Richardo Priest, MD  carvedilol (COREG) 12.5 MG tablet Take 1 tablet (12.5 mg total) by mouth 2 (two) times daily. 08/14/20 11/12/20  Richardo Priest, MD  Cholecalciferol (VITAMIN D3) 50 MCG (2000 UT) TABS Take  4,000 Units by mouth daily.    [provider]  cloNIDine (CATAPRES) 0.1 MG tablet Take 1 tablet (0.1 mg total) by mouth 2 (two) times daily. 09/10/20   Richardo Priest, MD  clopidogrel (PLAVIX) 75 MG tablet Take 75 mg by mouth daily.    [provider]  doxazosin (CARDURA) 2 MG tablet Take 2 mg by mouth at bedtime.     [provider]  insulin glargine (LANTUS) 100 UNIT/ML injection Inject 20 Units into the skin daily.    [provider]  Iron Combinations (IRON COMPLEX PO) Take 65 mg by mouth daily.    [provider]  lisinopril (ZESTRIL) 20 MG tablet Take 1 tablet (20 mg total) by mouth daily. 08/27/20 11/25/20  Richardo Priest, MD  metFORMIN (GLUCOPHAGE-XR) 500 MG 24 hr tablet Take 500 mg by mouth every morning. 08/09/20   [provider]  Multiple Vitamins-Minerals (EYE VITAMINS PO) Take 1 tablet by mouth daily.    [provider]  nitroGLYCERIN (NITROSTAT) 0.4 MG SL tablet Place 1 tablet (0.4 mg total) under the tongue every 5 (five) minutes as needed for chest pain. 04/19/19   Richardo Priest, MD  NOVOFINE 32G X 6 MM MISC daily. as directed 09/02/19   [provider]  omeprazole (PRILOSEC) 40 MG capsule Take 40 mg by mouth daily. 09/02/19   [provider]  Pancrelipase, Lip-Prot-Amyl, (CREON) 24000-76000 units CPEP Take 1 capsule by mouth in the morning, at noon, and at bedtime. 12/16/19   [provider]     Allergies:   No Known Allergies  Social History:   Social History   Socioeconomic History  . Marital status: Married    Spouse name: Not on file  . Number of children: Not on file  . Years of education: Not on file  . Highest education level: Not on file  Occupational History  . Not on file  Tobacco Use  . Smoking status: Former Smoker    Types: Cigarettes  . Smokeless tobacco: Never Used  Vaping Use  . Vaping Use: Never used  Substance and Sexual Activity  . Alcohol use: No  . Drug use:  No  . Sexual activity: Not on file  Other Topics Concern  . Not on file  Social History Narrative  . Not on file   Social Determinants of Health   Financial Resource Strain: Not on file  Food Insecurity: Not on file  Transportation Needs: Not on file  Physical Activity: Not on file  Stress: Not on file  Social Connections: Not on file  Intimate Partner Violence: Not on file     Family History:  The patient's family history includes Diabetes in his mother; Heart failure in his father and mother.     Review of Systems: [y] = yes, [ ]  = no   . General: Weight gain [ ] ; Weight loss [ ] ; Anorexia [ ] ; Fatigue [ ] ; Fever [ ] ; Chills [ ] ; Weakness [ ]   . Cardiac: Chest pain/pressure [ ] ; Resting SOB [ ] ; Exertional SOB [ ] ; Orthopnea [ ] ; Pedal  Edema [ ] ; Palpitations [ ] ; Syncope [ ] ; Presyncope [Y]; Paroxysmal nocturnal dyspnea[ ]   . Pulmonary: Cough [ ] ; Wheezing[ ] ; Hemoptysis[ ] ; Sputum [ ] ; Snoring [ ]   . GI: Vomiting[ ] ; Dysphagia[ ] ; Melena[ ] ; Hematochezia [ ] ; Heartburn[ ] ; Abdominal pain [ ] ; Constipation [ ] ; Diarrhea [ ] ; BRBPR [ ]   . GU: Hematuria[ ] ; Dysuria [ ] ; Nocturia[ ]   . Vascular: Pain in legs with walking [ ] ; Pain in feet with lying flat [ ] ; Non-healing sores [ ] ; Stroke [ ] ; TIA [ ] ; Slurred speech [ ] ;  . Neuro: Headaches[ ] ; Vertigo[ ] ; Seizures[ ] ; Paresthesias[ ] ;Blurred vision [ ] ; Diplopia [ ] ; Vision changes [ ]   . Ortho/Skin: Arthritis [ ] ; Joint pain [ ] ; Muscle pain [ ] ; Joint swelling [ ] ; Back Pain [ ] ; Rash [ ]   . Psych: Depression[ ] ; Anxiety[ ]   . Heme: Bleeding problems [ ] ; Clotting disorders [ ] ; Anemia [ ]   . Endocrine: Diabetes [ ] ; Thyroid dysfunction[ ]      Physical Exam/Data:   Vitals:   10/01/20 1851 10/01/20 2045  BP: (!) 192/102 (!) 181/93  Pulse: 65 75  Resp: 18 16  Temp: 98.1 F (36.7 C)   TempSrc: Oral   SpO2: 100% 100%   No intake or output data in the 24 hours ending 10/01/20 2113 There were no vitals filed for this  visit. There is no height or weight on file to calculate BMI.  General:  Well nourished, well developed, in no acute distress HEENT: normal Lymph: no adenopathy Neck: no JVD Endocrine:  No thryomegaly Vascular: No carotid bruits; FA pulses 2+ bilaterally without bruits  Cardiac:  normal S1, S2; RRR; 2/6 systolic murmur early peaking (preserved second heart sound)  Lungs:  clear to auscultation bilaterally, no wheezing, rhonchi or rales  Abd: soft, nontender, no hepatomegaly  Ext: no edema Musculoskeletal:  No deformities, BUE and BLE strength normal and equal Skin: warm and dry  Neuro:  CNs 2-12 intact, no focal abnormalities noted Psych:  Normal affect    EKG:  The ECG that was done 10/01/2020 (18:46) was personally reviewed and demonstrates sinus rhythm with 1st degree AVB, LVH and repol abnormalities.   Laboratory Data:  Chemistry Recent Labs  Lab 10/01/20 1854  NA 140  K 4.1  CL 109  CO2 21*  GLUCOSE 64*  BUN 42*  CREATININE 2.12*  CALCIUM 9.0  GFRNONAA 30*  ANIONGAP 10    No results for input(s): PROT, ALBUMIN, AST, ALT, ALKPHOS, BILITOT in the last 168 hours. Hematology Recent Labs  Lab 10/01/20 1854  WBC 9.6  RBC 3.33*  HGB 10.0*  HCT 29.7*  MCV 89.2  MCH 30.0  MCHC 33.7  RDW 13.5  PLT 291   Cardiac EnzymesNo results for input(s): TROPONINI in the last 168 hours. No results for input(s): TROPIPOC in the last 168 hours.  BNPNo results for input(s): BNP, PROBNP in the last 168 hours.  DDimer No results for input(s): DDIMER in the last 168 hours.  Radiology/Studies:  DG Chest 2 View  Result Date: 10/01/2020 CLINICAL DATA:  Chest pain, bradycardia EXAM: CHEST - 2 VIEW COMPARISON:  03/06/2020 FINDINGS: Frontal and lateral views of the chest demonstrate a stable cardiac silhouette. Chronic areas of scarring and hyperinflation are seen compatible with emphysema. Chronic consolidation is seen at the right apex, please refer to previous biopsy results  03/06/2020. No acute airspace disease, effusion, or pneumothorax. No acute bony abnormalities. IMPRESSION: 1.  Chronic nodular consolidation at the right apex, please refer to previous biopsy results 03/06/2020. 2. Stable emphysema. 3. No new airspace disease. Electronically Signed   By: Randa Ngo M.D.   On: 10/01/2020 20:09    Assessment and Plan:   1.  Bradycardia  -Monitor on telemetry -Hold carvedilol and clonidine -Check TSH level -Daily ECGs -EP consult in AM - to review ZIO patch tracings -Transthoracic echo -NPO after midnight  2. Hypertension  -Continue amlodipine, lisinopril and cardura -Hold clonidine and carvedilol -Start Hydralazine 25 mg every 8 hours  3. Chest pain  -Serial ECGs -Trend cardiac enzymes -Continue Plavix -Continue atorvastatin    Severity of Illness: The appropriate patient status for this patient is INPATIENT. Inpatient status is judged to be reasonable and necessary in order to provide the required intensity of service to ensure the patient's safety. The patient's presenting symptoms, physical exam findings, and initial radiographic and laboratory data in the context of their chronic comorbidities is felt to place them at high risk for further clinical deterioration. Furthermore, it is not anticipated that the patient will be medically stable for discharge from the hospital within 2 midnights of admission. The following factors support the patient status of inpatient.   " The patient's presenting symptoms include dizziness. " The worrisome physical exam findings include bradycardia. " The initial radiographic and laboratory data are worrisome because of abnormal ECG. " The chronic co-morbidities include CAD, HFpEF.   * I certify that at the point of admission it is my clinical judgment that the patient will require inpatient hospital care spanning beyond 2 midnights from the point of admission due to high intensity of service, high risk for  further deterioration and high frequency of surveillance required.*    For questions or updates, please contact [facility] Please consult [email or web address] for contact info under Cardiology/STEMI.    Signed, Meade Maw, MD  10/01/2020 9:13 PM

## 2020-10-01 NOTE — Telephone Encounter (Signed)
I reviewed the chart extensively.  Patient is having lightheadedness with these rhythms and very slow heart rates.  Best to go to the emergency room.  Patient probably needs withholding of beta-blockers and this might resolve but to be on the safer side she needs to be monitored carefully.

## 2020-10-01 NOTE — Telephone Encounter (Signed)
New message:     Anderson Malta from [employer] calling to report results for ZIO patch  possible complete heart block can seen on strip number 4 and 13. This accrued on September 18, 2020.

## 2020-10-01 NOTE — Telephone Encounter (Signed)
Spoke to the patients wife and per Dr. Geraldo Pitter I advised them that the patient should go directly to the Emergency Department at this time.   The patients wife verbalizes understanding and states that they will do this.    Encouraged patient to call back with any questions or concerns.

## 2020-10-02 ENCOUNTER — Inpatient Hospital Stay (HOSPITAL_BASED_OUTPATIENT_CLINIC_OR_DEPARTMENT_OTHER): Payer: Medicare Other

## 2020-10-02 DIAGNOSIS — Z7902 Long term (current) use of antithrombotics/antiplatelets: Secondary | ICD-10-CM | POA: Diagnosis not present

## 2020-10-02 DIAGNOSIS — I5032 Chronic diastolic (congestive) heart failure: Secondary | ICD-10-CM | POA: Diagnosis not present

## 2020-10-02 DIAGNOSIS — I441 Atrioventricular block, second degree: Secondary | ICD-10-CM | POA: Diagnosis not present

## 2020-10-02 DIAGNOSIS — Z20822 Contact with and (suspected) exposure to covid-19: Secondary | ICD-10-CM | POA: Diagnosis not present

## 2020-10-02 DIAGNOSIS — Z87891 Personal history of nicotine dependence: Secondary | ICD-10-CM | POA: Diagnosis not present

## 2020-10-02 DIAGNOSIS — R079 Chest pain, unspecified: Secondary | ICD-10-CM | POA: Diagnosis not present

## 2020-10-02 DIAGNOSIS — I2510 Atherosclerotic heart disease of native coronary artery without angina pectoris: Secondary | ICD-10-CM | POA: Diagnosis not present

## 2020-10-02 DIAGNOSIS — Z79899 Other long term (current) drug therapy: Secondary | ICD-10-CM | POA: Diagnosis not present

## 2020-10-02 DIAGNOSIS — I110 Hypertensive heart disease with heart failure: Secondary | ICD-10-CM | POA: Diagnosis not present

## 2020-10-02 DIAGNOSIS — R001 Bradycardia, unspecified: Secondary | ICD-10-CM | POA: Diagnosis not present

## 2020-10-02 LAB — BASIC METABOLIC PANEL
Anion gap: 9 (ref 5–15)
BUN: 42 mg/dL — ABNORMAL HIGH (ref 8–23)
CO2: 21 mmol/L — ABNORMAL LOW (ref 22–32)
Calcium: 9.2 mg/dL (ref 8.9–10.3)
Chloride: 110 mmol/L (ref 98–111)
Creatinine, Ser: 2.09 mg/dL — ABNORMAL HIGH (ref 0.61–1.24)
GFR, Estimated: 31 mL/min — ABNORMAL LOW (ref >60)
Glucose, Bld: 128 mg/dL — ABNORMAL HIGH (ref 70–99)
Potassium: 3.8 mmol/L (ref 3.5–5.1)
Sodium: 140 mmol/L (ref 135–145)

## 2020-10-02 LAB — ECHOCARDIOGRAM COMPLETE
AR max vel: 1.93 cm2
AV Area VTI: 1.93 cm2
AV Area mean vel: 1.97 cm2
AV Mean grad: 12.5 mmHg
AV Peak grad: 24.6 mmHg
Ao pk vel: 2.48 m/s
Area-P 1/2: 3.2 cm2
S' Lateral: 3.7 cm

## 2020-10-02 LAB — CBC
HCT: 31.1 % — ABNORMAL LOW (ref 39.0–52.0)
Hemoglobin: 10 g/dL — ABNORMAL LOW (ref 13.0–17.0)
MCH: 29.4 pg (ref 26.0–34.0)
MCHC: 32.2 g/dL (ref 30.0–36.0)
MCV: 91.5 fL (ref 80.0–100.0)
Platelets: 260 10*3/uL (ref 150–400)
RBC: 3.4 MIL/uL — ABNORMAL LOW (ref 4.22–5.81)
RDW: 13.4 % (ref 11.5–15.5)
WBC: 8.9 10*3/uL (ref 4.0–10.5)
nRBC: 0 % (ref 0.0–0.2)

## 2020-10-02 LAB — LIPID PANEL
Cholesterol: 138 mg/dL (ref 0–200)
HDL: 32 mg/dL — ABNORMAL LOW (ref >40)
LDL Cholesterol: 67 mg/dL (ref 0–99)
Total CHOL/HDL Ratio: 4.3 RATIO
Triglycerides: 197 mg/dL — ABNORMAL HIGH (ref <150)
VLDL: 39 mg/dL (ref 0–40)

## 2020-10-02 LAB — TROPONIN I (HIGH SENSITIVITY)
Troponin I (High Sensitivity): 22 ng/L — ABNORMAL HIGH (ref <18)
Troponin I (High Sensitivity): 27 ng/L — ABNORMAL HIGH (ref <18)

## 2020-10-02 LAB — SARS CORONAVIRUS 2 (TAT 6-24 HRS): SARS Coronavirus 2: NEGATIVE

## 2020-10-02 LAB — TSH: TSH: 4.36 u[IU]/mL (ref 0.350–4.500)

## 2020-10-02 LAB — PROTIME-INR
INR: 1 (ref 0.8–1.2)
Prothrombin Time: 13 seconds (ref 11.4–15.2)

## 2020-10-02 LAB — MAGNESIUM: Magnesium: 1.9 mg/dL (ref 1.7–2.4)

## 2020-10-02 LAB — GLUCOSE, CAPILLARY: Glucose-Capillary: 84 mg/dL (ref 70–99)

## 2020-10-02 MED ORDER — HYDRALAZINE HCL 25 MG PO TABS: 25.0000 mg | ORAL_TABLET | Freq: Three times a day (TID) | ORAL | 3 refills | Status: DC

## 2020-10-02 NOTE — Care Management CC44 (Signed)
Condition Code 44 Documentation Completed  Patient Details  Name: CONNIE HILGERT MRN: [id number] Date of Birth: October 26, 1935   Condition Code 44 given:  Yes Patient signature on Condition Code 44 notice:  Yes Documentation of 2 MD's agreement:  Yes Code 44 added to claim:  Yes    Dawayne Patricia, RN 10/02/2020, 4:28 PM

## 2020-10-02 NOTE — Discharge Summary (Addendum)
ELECTROPHYSIOLOGY PROCEDURE DISCHARGE SUMMARY    Patient ID: Troy [name],  MRN: [id number], DOB/AGE: 1935-10-10 85 y.o.  Admit date: 10/01/2020 Discharge date: 10/02/2020  Primary Care Physician: Street, [name], MD  Primary Cardiologist: Dr. Bettina Gavia Electrophysiologist:  New  Primary Discharge Diagnosis:  Mobitz I AV block with nocturnal bradycardia  Secondary Discharge Diagnosis:  HTN Chronic diastolic CHF CAD   Procedures This Admission:  Echocardiogram with LVEF 55-60%  Brief HPI/ [place] is a 85 y.o. male with a medical history as above.   Pt presented to [facility] after 7 day Zio resulted and noted mobitz I second degree AV block with transient nocturnal complete heart block.  Pertinent labs on admission include K 4.1, Cr 2.12 (Up from 1.40 but  most recent prior check was 09/10/2018), COVID negative, Hgb 10.0, WBC 9.6. HS Trop 14 -> 22 -> 27. Mg 1.9.   Lipids Chol 138, TGs 197, HDL 32, LDL 67  Review of Zio patch showed frequent symptoms of dizziness associated with NSR. Periods of Mobitz I and occasional 2:1 AV block. Brief episode of CHB noted 2/23 at ~0530 (nocturnal)  He describes his episodes of dizziness as imbalance, which he has chronically contributed to being legally blind. No near syncope or  Syncope. No SOB. He is able to walk 1 mile on the TM most days without symptoms. He has occasional episodes of chest discomfort that resolve spontaneously and are not typically related to exertion. He does at times find relief from NTG.   Pt was monitored on tele which showed NSR and Mobitz 1. Pt did have brief nocturnal 2:1 AV block overnight. No significant pause or CHB noted. Coreg has been held. We will resume clonidine with his very poorly controlled HTN, and add hydralazine in the place of his coreg.   With overall lack of symptoms pt was felt appropriate for home with close follow up as scheduled below. Offered even closer follow up in [place],  [place] wife has difficulty driving that distance and would prefer follow up in [place]. She knows to call if they have any continuing issues with his BP. Goal BP would ideally be 130-140 range.    Physical Exam: Vitals:   10/02/20 0300 10/02/20 0741 10/02/20 1059 10/02/20 1247  BP: (!) 200/93 (!) 147/50 (!) 187/75 (!) 171/70  Pulse: 60 70 73 76  Resp: 18 17 20 20   Temp: 97.8 F (36.6 C)  97.7 F (36.5 C)   TempSrc: Oral Oral Oral   SpO2: 100% 97% 98% 100%    Labs:   Lab Results  Component Value Date   WBC 8.9 10/01/2020   HGB 10.0 (L) 10/01/2020   HCT 31.1 (L) 10/01/2020   MCV 91.5 10/01/2020   PLT 260 10/01/2020    Recent Labs  Lab 10/01/20 2354  NA 140  K 3.8  CL 110  CO2 21*  BUN 42*  CREATININE 2.09*  CALCIUM 9.2  GLUCOSE 128*     Discharge Medications:  Allergies as of 10/02/2020   No Known Allergies      Medication List     STOP taking these medications    carvedilol 12.5 MG tablet Commonly known as: COREG       TAKE these medications    amLODipine 5 MG tablet Commonly known as: NORVASC Take 1 tablet (5 mg total) by mouth daily.   ascorbic acid 500 MG/5ML syrup Commonly known as: VITAMIN C Take 500 mg by mouth daily.  atorvastatin 80 MG tablet Commonly known as: LIPITOR TAKE 1 TABLET EACH EVENING What changed: See the new instructions.   cloNIDine 0.1 MG tablet Commonly known as: Catapres Take 1 tablet (0.1 mg total) by mouth 2 (two) times daily.   clopidogrel 75 MG tablet Commonly known as: PLAVIX Take 75 mg by mouth daily.   Creon 24000-76000 units Cpep Generic drug: Pancrelipase (Lip-Prot-Amyl) Take 1 capsule by mouth in the morning, at noon, and at bedtime.   doxazosin 2 MG tablet Commonly known as: CARDURA Take 2 mg by mouth at bedtime.   EYE VITAMINS PO Take 1 tablet by mouth daily.   hydrALAZINE 25 MG tablet Commonly known as: APRESOLINE Take 1 tablet (25 mg total) by mouth every 8 (eight) hours.   insulin  glargine 100 UNIT/ML injection Commonly known as: LANTUS Inject 20 Units into the skin daily.   IRON COMPLEX PO Take 65 mg by mouth daily.   lisinopril 20 MG tablet Commonly known as: ZESTRIL Take 1 tablet (20 mg total) by mouth daily.   metFORMIN 500 MG 24 hr tablet Commonly known as: GLUCOPHAGE-XR Take 500 mg by mouth every morning.   nitroGLYCERIN 0.4 MG SL tablet Commonly known as: NITROSTAT Place 1 tablet (0.4 mg total) under the tongue every 5 (five) minutes as needed for chest pain.   NovoFine 32G X 6 MM Misc Generic drug: Insulin Pen Needle daily. as directed   omeprazole 40 MG capsule Commonly known as: PRILOSEC Take 40 mg by mouth daily.   Systane Balance 0.6 % Soln Generic drug: Propylene Glycol Place 1 drop into both eyes at bedtime.   Vitamin D3 50 MCG (2000 UT) Tabs Take 4,000 Units by mouth daily.        Disposition:     Follow-up Information     Richardo Priest, MD Follow up.   Specialties: Cardiology, Radiology Why: 3/24 at 0940 for post hospital follow up Contact information: [place] [address] [phone]         Constance Haw, MD Follow up.   Specialty: Cardiology Why: We will call you to make an appointment for follow up Contact information: [place] [address] [phone]                 Duration of Discharge Encounter: Greater than 30 minutes including physician time.  Jacalyn Lefevre, PA-C  10/02/2020 3:12 PM

## 2020-10-02 NOTE — Progress Notes (Signed)
  Echocardiogram 2D Echocardiogram has been performed.  Jennette Dubin 10/02/2020, 2:10 PM

## 2020-10-02 NOTE — Progress Notes (Signed)
Inpatient Diabetes Program Recommendations  AACE/ADA: New Consensus Statement on Inpatient Glycemic Control  Target Ranges:  Prepandial:   less than 140 mg/dL      Peak postprandial:   less than 180 mg/dL (1-2 hours)      Critically ill patients:  140 - 180 mg/dL  Results for MAICO, MULVEHILL (MRN [id number]) as of 10/02/2020 10:29  Ref. Range 10/01/2020 18:54 10/01/2020 23:54  Glucose Latest Ref Range: 70 - 99 mg/dL 64 (L) 128 (H)    Review of Glycemic Control  Diabetes history: DM2 Outpatient Diabetes medications: Lantus 20 units QPM, Metformin XR 500 mg QAM Current orders for Inpatient glycemic control: Lantus 20 units daily, Metformin XR 500 mg QAM  Inpatient Diabetes Program Recommendations:    Insulin: Please discontinue Lantus and order CBGs AC&HS with Novolog 0-15 units TID with meals and Novolog 0-5 units QHS.  Thanks, Barnie Alderman, RN, MSN, CDE Diabetes Coordinator Inpatient Diabetes Program [phone] (Team Pager from 8am to 5pm)

## 2020-10-02 NOTE — Care Management Obs Status (Signed)
[facility] NOTIFICATION   Patient Details  Name: Troy [name] MRN: [id number] Date of Birth: [birth year]   Medicare Observation Status Notification Given:  Yes    Dawayne Patricia, RN 10/02/2020, 4:27 PM

## 2020-10-02 NOTE — Consult Note (Addendum)
ELECTROPHYSIOLOGY CONSULT NOTE    Patient ID: Troy [name] MRN: [id number], DOB/AGE: 83-Oct-1937 83 y.o.  Admit date: 10/01/2020 Date of Consult: 10/02/2020  Primary Physician: Street, [name], MD Primary Cardiologist: No primary care provider on file.  Electrophysiologist: New  Referring Provider: Dr. Paticia Stack / Dr. Bettina Gavia  Patient Profile: Troy [name] is a 85 y.o. male with a history of CAD s/p PCI "15 years ago", difficult to control HTN, HLD, and chronic diasotlic CHF  who is being seen today for the evaluation of bradycardia at the request of Dr. Paticia Stack.  HPI:  Troy [name] is a 85 y.o. male with medical history as above.   Seen in Dr. Joya Gaskins office 09/17/20 for regular visit. BP running in 170s. EKG that day showed Mobitz 1 Wenckebach on coreg and clonidine. He was having occasional episodes of dizziness so 7 day monitor was placed. AS murmur also noted on exam. No syncope.   7 day zio was finalized 10/01/20 and noted "possible periods of 3rd degree HB" so was instructed to report to ER.   Pertinent labs on admission include K 4.1, Cr 2.12 (Up from 1.40 but  most recent prior check was 09/10/2018), COVID negative, Hgb 10.0, WBC 9.6. HS Trop 14 -> 22 -> 27. Mg 1.9.  Lipids Chol 138, TGs 197, HDL 32, LDL 67  Currently, the patient is doing well. He had just finished standing to urinate and did so without dizziness. He has had "dizziness" for quite some time. He has attributed this to his legal blindness and imbalance. He feels "wobbly" and at times leans backwards when walking forward. People in church have pointed this out to him when he has leaned backwards into them. He denies syncope or near syncope at any time.   He has occasional atypical chest pain. It occurs along the left side of his sternum with no clear aggravating factors. It is at time relieved by nitro, but other times is relieved without intervention. It can last for minutes or up to an hour.    He walks on  the treadmill at home for "at least a mile" most days without chest pain or difficulty. He denies SOB, edema, weight gain, or early satiety.   Past Medical History:  Diagnosis Date  . Anemia 12/09/2015  . CAD in native artery 12/09/2015   Overview:  PCI/ stent 2001  . Chronic diastolic heart failure ([place]) 12/09/2015  . Coronary artery disease involving native coronary artery of native heart with angina pectoris ([place]) 12/09/2015   Overview:  PCI/ stent 2001  . Hyperlipidemia 12/09/2015  . Hypertensive heart disease with heart failure ([place]) 12/09/2015  . Necrotizing pancreatitis 06/26/2015  . Pseudocyst of pancreas 06/26/2015     Surgical History:  Past Surgical History:  Procedure Laterality Date  . ABDOMINAL AORTOGRAM W/LOWER EXTREMITY Bilateral 09/10/2018   Procedure: ABDOMINAL AORTOGRAM W/LOWER EXTREMITY;  Surgeon: Elam Dutch, MD;  Location: [facility];  Service: Cardiovascular;  Laterality: Bilateral;  . CHOLECYSTECTOMY    . PERIPHERAL VASCULAR INTERVENTION Right 09/10/2018   Procedure: PERIPHERAL VASCULAR INTERVENTION;  Surgeon: Elam Dutch, MD;  Location: [facility];  Service: Cardiovascular;  Laterality: Right;  common iliac  . TONSILLECTOMY AND ADENOIDECTOMY    . TOTAL KNEE REVISION Bilateral      Medications Prior to Admission  Medication Sig Dispense Refill Last Dose  . insulin glargine (LANTUS) 100 UNIT/ML injection Inject 20 Units into the skin every evening.   09/30/2020  at Unknown time  . metFORMIN (GLUCOPHAGE-XR) 500 MG 24 hr tablet Take 500 mg by mouth every morning.   10/01/2020 at Unknown time  . amLODipine (NORVASC) 5 MG tablet Take 1 tablet (5 mg total) by mouth daily. 90 tablet 3   . ascorbic acid (VITAMIN C) 500 MG/5ML syrup Take 500 mg by mouth daily.     Marland Kitchen atorvastatin (LIPITOR) 80 MG tablet TAKE 1 TABLET EACH EVENING (Patient taking differently: Take 80 mg by mouth every evening.) 90 tablet 1   . carvedilol (COREG) 12.5 MG tablet Take 1  tablet (12.5 mg total) by mouth 2 (two) times daily. 180 tablet 3   . Cholecalciferol (VITAMIN D3) 50 MCG (2000 UT) TABS Take 4,000 Units by mouth daily.     . cloNIDine (CATAPRES) 0.1 MG tablet Take 1 tablet (0.1 mg total) by mouth 2 (two) times daily. 60 tablet 3   . clopidogrel (PLAVIX) 75 MG tablet Take 75 mg by mouth daily.     Marland Kitchen doxazosin (CARDURA) 2 MG tablet Take 2 mg by mouth at bedtime.      . Iron Combinations (IRON COMPLEX PO) Take 65 mg by mouth daily.     Marland Kitchen lisinopril (ZESTRIL) 20 MG tablet Take 1 tablet (20 mg total) by mouth daily. 90 tablet 3   . Multiple Vitamins-Minerals (EYE VITAMINS PO) Take 1 tablet by mouth daily.     . nitroGLYCERIN (NITROSTAT) 0.4 MG SL tablet Place 1 tablet (0.4 mg total) under the tongue every 5 (five) minutes as needed for chest pain. 25 tablet 4   . NOVOFINE 32G X 6 MM MISC daily. as directed     . omeprazole (PRILOSEC) 40 MG capsule Take 40 mg by mouth daily.     . Pancrelipase, Lip-Prot-Amyl, (CREON) 24000-76000 units CPEP Take 1 capsule by mouth in the morning, at noon, and at bedtime.       Inpatient Medications:  . amLODipine  5 mg Oral Daily  . atorvastatin  80 mg Oral QHS  . doxazosin  2 mg Oral QHS  . enoxaparin (LOVENOX) injection  30 mg Subcutaneous Q24H  . hydrALAZINE  25 mg Oral Q8H  . insulin glargine  20 Units Subcutaneous Daily  . lisinopril  20 mg Oral Daily  . metFORMIN  500 mg Oral Q breakfast    Allergies: No Known Allergies  Social History   Socioeconomic History  . Marital status: Married    Spouse name: Not on file  . Number of children: Not on file  . Years of education: Not on file  . Highest education level: Not on file  Occupational History  . Not on file  Tobacco Use  . Smoking status: Former Smoker    Types: Cigarettes  . Smokeless tobacco: Never Used  Vaping Use  . Vaping Use: Never used  Substance and Sexual Activity  . Alcohol use: No  . Drug use: No  . Sexual activity: Not on file  Other  Topics Concern  . Not on file  Social History Narrative  . Not on file   Social Determinants of Health   Financial Resource Strain: Not on file  Food Insecurity: Not on file  Transportation Needs: Not on file  Physical Activity: Not on file  Stress: Not on file  Social Connections: Not on file  Intimate Partner Violence: Not on file     Family History  Problem Relation Age of Onset  . Heart failure Mother   . Diabetes Mother   .  Heart failure Father      Review of Systems: All other systems reviewed and are otherwise negative except as noted above.  Physical Exam: Vitals:   10/01/20 2300 10/01/20 2330 10/02/20 0300 10/02/20 0741  BP: (!) 191/106 135/87 (!) 200/93 (!) 147/50  Pulse: 71 67 60 70  Resp: 16 18 18 17   Temp:   97.8 F (36.6 C)   TempSrc:   Oral Oral  SpO2: 100% 99% 100% 97%    GEN- The patient is well appearing, alert and oriented x 3 today.   HEENT: normocephalic, atraumatic; sclera clear, conjunctiva pink; hearing intact; oropharynx clear; neck supple Lungs- Clear to ausculation bilaterally, normal work of breathing.  No wheezes, rales, rhonchi Heart- Regular rate and rhythm, no murmurs, rubs or gallops GI- soft, non-tender, non-distended, bowel sounds present Extremities- no clubbing, cyanosis, or edema; DP/PT/radial pulses 2+ bilaterally MS- no significant deformity or atrophy Skin- warm and dry, no rash or lesion Psych- euthymic mood, full affect Neuro- strength and sensation are intact  Labs:   Lab Results  Component Value Date   WBC 8.9 10/01/2020   HGB 10.0 (L) 10/01/2020   HCT 31.1 (L) 10/01/2020   MCV 91.5 10/01/2020   PLT 260 10/01/2020    Recent Labs  Lab 10/01/20 2354  NA 140  K 3.8  CL 110  CO2 21*  BUN 42*  CREATININE 2.09*  CALCIUM 9.2  GLUCOSE 128*      Radiology/Studies: DG Chest 2 View  Result Date: 10/01/2020 CLINICAL DATA:  Chest pain, bradycardia EXAM: CHEST - 2 VIEW COMPARISON:  03/06/2020 FINDINGS: Frontal  and lateral views of the chest demonstrate a stable cardiac silhouette. Chronic areas of scarring and hyperinflation are seen compatible with emphysema. Chronic consolidation is seen at the right apex, please refer to previous biopsy results 03/06/2020. No acute airspace disease, effusion, or pneumothorax. No acute bony abnormalities. IMPRESSION: 1. Chronic nodular consolidation at the right apex, please refer to previous biopsy results 03/06/2020. 2. Stable emphysema. 3. No new airspace disease. Electronically Signed   By: Randa Ngo M.D.   On: 10/01/2020 20:09    EKG: on arrival showed Mobitz 1 block with clear PR lengthening prior to a missed beat, and a shorter PR interval after. (personally reviewed)  TELEMETRY: NSR 60s mostly. Mobitz 1 with wenckbach and 2:1 block overnight noted (personally reviewed)  Assessment/Plan: 1.  Second Degree AV block, Mobitz 1 and 2:1 block Last dose of coreg and clonidine were both yesterday am.  Avoid AV nodal blockade. Reviewing Zio patch, pt had frequent symptoms of his dizziness/imbalance that were associated with NSR. At times was reported within several moments of a dropped beat.  He had several drop beats during my exam without symptoms.  No significant pauses noted on Zio patch. 2:1 block occurred most frequently nocturnally. One strip with short PR intervals concerning for possible 3rd degree AV block, but nocturnal.   2. CAD s/p CABG He has occasional chest pain, but no clear aggravating symptoms. Yesterday he had mild chest discomfort on the treadmill that relieved with rest. Other days he is able to walk 1 mile on the TM without issue.  Repeat Echo pending. If EF down, may need repeat ischemic work up (elevated Cr may complicate)  3. Difficult to control HTN Pt has been on coreg, amlodipine, clonidine, cardura and lisinopril and has continued to have elevated pressures.  Labile component. Will also check orthostatics.   4. Dizziness Chronic  component and by history related  to legal blindness and balance issue. Would likely benefit from PT for strategies.    I will discuss further with Dr. Rayann Heman. I do not think patient has an urgent indication for pacing. I will leave NPO for now just in case.  Echo and orthostatic vital signs pending.   For questions or updates, please contact [place] Please consult [email or web address] for contact info under Cardiology/STEMI.  Signed, Shirley Friar, PA-C  10/02/2020 8:17 AM    I have seen, examined the patient, and reviewed the above assessment and plan.  Changes to above are made where necessary.  On exam, RRR.  He appears to have mobitz I second degree AV block and transient nocturnal AV block.  No indication for pacing at this time. We will stop coreg.  Start hydralazine for BP. Follow-up with Dr Curt Bears in [place] office for EP.  Electrophysiology team to see as needed while here. Please call with questions.   Co Sign: Thompson Grayer, MD 10/02/2020 4:25 PM

## 2020-10-02 NOTE — Progress Notes (Signed)
Patient and spouse given discharge instructions, medication list and follow up appointments. Patient and spouse verbalized understanding. Patient prescriptions sent to personal pharmacy. IV and tele dcd. Will discharge home as ordered. Transported to exit via wheel chair and nursing staff. Akito Boomhower, Bettina Gavia  RN

## 2020-10-02 NOTE — Progress Notes (Signed)
Orthostatic vital signs done as ordered. See flowsheet. Omayra Tulloch, Bettina Gavia RN

## 2020-10-08 ENCOUNTER — Ambulatory Visit: Payer: Medicare Other | Admitting: Cardiology

## 2020-10-10 ENCOUNTER — Ambulatory Visit: Payer: Medicare Other | Admitting: Student

## 2020-10-15 ENCOUNTER — Other Ambulatory Visit: Payer: Self-pay

## 2020-10-15 ENCOUNTER — Encounter: Payer: Self-pay | Admitting: Cardiology

## 2020-10-15 ENCOUNTER — Ambulatory Visit (INDEPENDENT_AMBULATORY_CARE_PROVIDER_SITE_OTHER): Payer: Medicare Other | Admitting: Cardiology

## 2020-10-15 VITALS — BP 148/64 | HR 73 | Ht 71.0 in | Wt 173.4 lb

## 2020-10-15 DIAGNOSIS — I25119 Atherosclerotic heart disease of native coronary artery with unspecified angina pectoris: Secondary | ICD-10-CM

## 2020-10-15 DIAGNOSIS — I441 Atrioventricular block, second degree: Secondary | ICD-10-CM

## 2020-10-15 NOTE — Patient Instructions (Signed)
Medication Instructions:  Your physician recommends that you continue on your current medications as directed. Please refer to the Current Medication list given to you today.  *If you need a refill on your cardiac medications before your next appointment, please call your pharmacy*   Lab Work: None ordered   Testing/Procedures: None ordered   Follow-Up: At [facility], you and your health needs are our priority.  As part of our continuing mission to provide you with exceptional heart care, we have created designated Provider Care Teams.  These Care Teams include your primary Cardiologist (physician) and Advanced Practice Providers (APPs -  Physician Assistants and Nurse Practitioners) who all work together to provide you with the care you need, when you need it.  Your next appointment:   as  needed  The format for your next appointment:   In Person  Provider:   Will Camnitz, MD    Thank you for choosing [facility]!!   Kholton Coate, RN [phone]  Other Instructions          

## 2020-10-15 NOTE — Progress Notes (Signed)
Electrophysiology Office Note   Date:  10/15/2020   ID:  Troy [name], DOB [birth year], MRN [id number]  PCP:  Street, [name], MD  Cardiologist: Bettina Gavia Primary Electrophysiologist:  Will Meredith Leeds, MD    Chief Complaint:    History of Present Illness: Troy [name] is a 85 y.o. male who is being seen today for the evaluation of mobitz 1 AV block at the request of Street, [name], *. Presenting today for electrophysiology evaluation.  He has a history of chronic diastolic heart failure, coronary artery disease, hypertension, hyperlipidemia.  He presented to [facility] with a 7-day ZIO monitor showing Mobitz 1 heart block with transient nocturnal 2-1 AV block.  He was monitored in the hospital which showed Mobitz 1 heart block and 2-1 AV block.  Carvedilol was held.  His clonidine was restarted and hydralazine was added in place of Coreg.  Today, he denies symptoms of palpitations, chest pain, shortness of breath, orthopnea, PND, lower extremity edema, claudication,  presyncope, syncope, bleeding, or neurologic sequela. The patient is tolerating medications without difficulties.  He continues to have dizziness, though this is greatly improved since his carvedilol was held.   Past Medical History:  Diagnosis Date  . Anemia 12/09/2015  . CAD in native artery 12/09/2015   Overview:  PCI/ stent 2001  . Chronic diastolic heart failure ([place]) 12/09/2015  . Coronary artery disease involving native coronary artery of native heart with angina pectoris ([place]) 12/09/2015   Overview:  PCI/ stent 2001  . Hyperlipidemia 12/09/2015  . Hypertensive heart disease with heart failure ([place]) 12/09/2015  . Necrotizing pancreatitis 06/26/2015  . Pseudocyst of pancreas 06/26/2015   Past Surgical History:  Procedure Laterality Date  . ABDOMINAL AORTOGRAM W/LOWER EXTREMITY Bilateral 09/10/2018   Procedure: ABDOMINAL AORTOGRAM W/LOWER EXTREMITY;  Surgeon: Elam Dutch, MD;  Location: [place] [facility];  Service: Cardiovascular;  Laterality: Bilateral;  . CHOLECYSTECTOMY    . PERIPHERAL VASCULAR INTERVENTION Right 09/10/2018   Procedure: PERIPHERAL VASCULAR INTERVENTION;  Surgeon: Elam Dutch, MD;  Location: [place] [facility];  Service: Cardiovascular;  Laterality: Right;  common iliac  . TONSILLECTOMY AND ADENOIDECTOMY    . TOTAL KNEE REVISION Bilateral      Current Outpatient Medications  Medication Sig Dispense Refill  . amLODipine (NORVASC) 5 MG tablet Take 1 tablet (5 mg total) by mouth daily. 90 tablet 3  . ascorbic acid (VITAMIN C) 500 MG/5ML syrup Take 500 mg by mouth daily.    Marland Kitchen atorvastatin (LIPITOR) 80 MG tablet TAKE 1 TABLET EACH EVENING 90 tablet 1  . Cholecalciferol (VITAMIN D3) 50 MCG (2000 UT) TABS Take 4,000 Units by mouth daily.    . cloNIDine (CATAPRES) 0.1 MG tablet Take 1 tablet (0.1 mg total) by mouth 2 (two) times daily. 60 tablet 3  . clopidogrel (PLAVIX) 75 MG tablet Take 75 mg by mouth daily.    Marland Kitchen doxazosin (CARDURA) 2 MG tablet Take 2 mg by mouth at bedtime.     . hydrALAZINE (APRESOLINE) 25 MG tablet Take 1 tablet (25 mg total) by mouth every 8 (eight) hours. 270 tablet 3  . insulin glargine (LANTUS) 100 UNIT/ML injection Inject 20 Units into the skin daily.    . Iron Combinations (IRON COMPLEX PO) Take 65 mg by mouth daily.    Marland Kitchen lisinopril (ZESTRIL) 20 MG tablet Take 1 tablet (20 mg total) by mouth daily. 90 tablet 3  . metFORMIN (GLUCOPHAGE-XR) 500 MG 24 hr tablet Take 500  mg by mouth every morning.    . Multiple Vitamins-Minerals (EYE VITAMINS PO) Take 1 tablet by mouth daily.    . nitroGLYCERIN (NITROSTAT) 0.4 MG SL tablet Place 1 tablet (0.4 mg total) under the tongue every 5 (five) minutes as needed for chest pain. 25 tablet 4  . NOVOFINE 32G X 6 MM MISC daily. as directed    . omeprazole (PRILOSEC) 40 MG capsule Take 40 mg by mouth daily.    . Pancrelipase, Lip-Prot-Amyl, (CREON) 24000-76000 units CPEP Take 1 capsule by mouth in  the morning, at noon, and at bedtime.    Marland Kitchen Propylene Glycol (SYSTANE BALANCE) 0.6 % SOLN Place 1 drop into both eyes at bedtime.     No current facility-administered medications for this visit.    Allergies:   Patient has no known allergies.   Social History:  The patient  reports that he has quit smoking. His smoking use included cigarettes. He has never used smokeless tobacco. He reports that he does not drink alcohol and does not use drugs.   Family History:  The patient's family history includes Diabetes in his mother; Heart failure in his father and mother.    ROS:  Please see the history of present illness.   Otherwise, review of systems is positive for none.   All other systems are reviewed and negative.    PHYSICAL EXAM: VS:  BP (!) 148/64   Pulse 73   Ht 5\' 11"  (1.803 m)   Wt 173 lb 6.4 oz (78.7 kg)   SpO2 98%   BMI 24.18 kg/m  , BMI Body mass index is 24.18 kg/m. GEN: Well nourished, well developed, in no acute distress  HEENT: normal  Neck: no JVD, carotid bruits, or masses Cardiac: RRR; no murmurs, rubs, or gallops,no edema  Respiratory:  clear to auscultation bilaterally, normal work of breathing GI: soft, nontender, nondistended, + BS MS: no deformity or atrophy  Skin: warm and dry Neuro:  Strength and sensation are intact Psych: euthymic mood, full affect  EKG:  EKG is ordered today. Personal review of the ekg ordered shows sinus rhythm, LVH, first-degree AV block  Recent Labs: 10/01/2020: BUN 42; Creatinine, Ser 2.09; Hemoglobin 10.0; Magnesium 1.9; Platelets 260; Potassium 3.8; Sodium 140; TSH 4.360    Lipid Panel     Component Value Date/Time   CHOL 138 10/01/2020 2354   TRIG 197 (H) 10/01/2020 2354   HDL 32 (L) 10/01/2020 2354   CHOLHDL 4.3 10/01/2020 2354   VLDL 39 10/01/2020 2354   LDLCALC 67 10/01/2020 2354     Wt Readings from Last 3 Encounters:  10/15/20 173 lb 6.4 oz (78.7 kg)  09/17/20 175 lb (79.4 kg)  08/14/20 165 lb (74.8 kg)       Other studies Reviewed: Additional studies/ records that were reviewed today include: TTE 10/02/20  Review of the above records today demonstrates:  1. Left ventricular ejection fraction, by estimation, is 55 to 60%. The  left ventricle has normal function. The left ventricle has no regional  wall motion abnormalities. Left ventricular diastolic parameters are  consistent with Grade I diastolic  dysfunction (impaired relaxation).  2. Right ventricular systolic function is normal. The right ventricular  size is normal.  3. The mitral valve is grossly normal. Mild mitral valve regurgitation.  4. The aortic valve is grossly normal. There is mild calcification of the  aortic valve. Aortic valve regurgitation is not visualized.   ASSESSMENT AND PLAN:  1.  Second-degree AV block,  Mobitz 1 and 2-1 block: Carvedilol stopped while in the hospital.  ECG today shows no evidence of block.  He currently feels well and is without complaint.  We will continue to monitor.  2.  Hypertension: Currently on amlodipine, clonidine, Cardura, lisinopril.  We will arrange for follow-up with general cardiology.  3.  Coronary artery disease status post CABG: No current chest pain.  Able to walk a mile on the treadmill without issue.  No changes.  Case discussed with primary cardiology  Current medicines are reviewed at length with the patient today.   The patient does not have concerns regarding his medicines.  The following changes were made today:  none  Labs/ tests ordered today include:  Orders Placed This Encounter  Procedures  . EKG 12-Lead     Disposition:   FU with Will Camnitz PRN  Signed, Will Meredith Leeds, MD  10/15/2020 3:10 PM     [address] [place] [address] [phone] (office) [phone] (fax)

## 2020-10-16 ENCOUNTER — Other Ambulatory Visit: Payer: Medicare Other

## 2020-10-17 NOTE — Progress Notes (Unsigned)
Cardiology Office Note:    Date:  10/18/2020   ID:  Troy [name], DOB 08/11/1935, MRN [id number]  PCP:  Street, Troy Mt, MD  Cardiologist:  Troy More, MD    Referring MD: Street, Troy [name], *    ASSESSMENT:    1. [place] (type) I [facility]) atrioventricular block   2. Sinus bradycardia on ECG   3. Hypertensive heart disease with heart failure ([place])   4. Coronary artery disease involving native coronary artery of native heart with angina pectoris ([place])    PLAN:    In order of problems listed above:  1. Improved off beta-blocker I encouraged his wife to buy the alive cor adapter and monitor his heart rhythm daily at home 2. Improved in the future will not resume the beta-blocker without backup pacemaker 3. Stable if needed she can give an extra dose of hydralazine and continue his current multidrug regimen with close home follow-up his wife is a nurse checks his blood pressure several times a day he takes calcium channel blocker centrally active clonidine alpha-blocker Cardura ACE inhibitor and hydralazine 4. Stable CAD having no angina off beta-blocker continue treatment including antiplatelet clopidogrel and his high intensity and statin 5. Mild aortic stenosis   Next appointment: 6 months   Medication Adjustments/Labs and Tests Ordered: Current medicines are reviewed at length with the patient today.  Concerns regarding medicines are outlined above.  No orders of the defined types were placed in this encounter.  No orders of the defined types were placed in this encounter.   Chief Complaint  Patient presents with  . Follow-up    History of Present Illness:    [name] is a 85 y.o. male with a hx of hypertensive heart disease with heart failure CAD with PCI mild aortic stenosis last seen by me 09/17/2020.  He complains of bradycardia and weakness ambulatory heart rhythm monitor showed frequent episodes of Wenke Bach 2-1 conduction with effective  sinus rates of 30 bpm.  He is admitted Troy [name] washed out his beta-blocker and fortunately did not need a pacemaker EKG this week by EP shows sinus rhythm first-degree AV block one-to-one conduction without second-degree AV block narrow QRS LVH repolarization reason GFR 31 cc/min creatinine 2.09.  Compliance with diet, lifestyle and medications: Yes  Since return home he has had no bradycardic events blood pressure tends to run in the 1 40-1 50 range which is his goal.  I told his wife if he has hypertensive urgency she can give him an extra dose of hydralazine but were going to avoid diuretics with his CKD and beta-blockers of bradycardia. Off beta-blocker he has had no chest pain edema shortness of breath or syncope.  He is a little bit [name] aware of his heart and says that at times it feels rapid but the rates are not.  Echocardiogram 10/02/2020 showed normal left ventricular systolic function normal filling pressures and mild aortic stenosis Past Medical History:  Diagnosis Date  . Anemia 12/09/2015  . CAD in native artery 12/09/2015   Overview:  PCI/ stent 2001  . Chronic diastolic heart failure ([place]) 12/09/2015  . Coronary artery disease involving native coronary artery of native heart with angina pectoris ([place]) 12/09/2015   Overview:  PCI/ stent 2001  . Hyperlipidemia 12/09/2015  . Hypertensive heart disease with heart failure ([place]) 12/09/2015  . Necrotizing pancreatitis 06/26/2015  . Pseudocyst of pancreas 06/26/2015    Past Surgical History:  Procedure Laterality Date  . ABDOMINAL  AORTOGRAM W/LOWER EXTREMITY Bilateral 09/10/2018   Procedure: ABDOMINAL AORTOGRAM W/LOWER EXTREMITY;  Surgeon: Troy Dutch, MD;  Location: [facility];  Service: Cardiovascular;  Laterality: Bilateral;  . CHOLECYSTECTOMY    . PERIPHERAL VASCULAR INTERVENTION Right 09/10/2018   Procedure: PERIPHERAL VASCULAR INTERVENTION;  Surgeon: Troy Dutch, MD;  Location: [facility];   Service: Cardiovascular;  Laterality: Right;  common iliac  . TONSILLECTOMY AND ADENOIDECTOMY    . TOTAL KNEE REVISION Bilateral     Current Medications: Current Meds  Medication Sig  . amLODipine (NORVASC) 5 MG tablet Take 1 tablet (5 mg total) by mouth daily.  Marland Kitchen ascorbic acid (VITAMIN C) 500 MG/5ML syrup Take 500 mg by mouth daily.  Marland Kitchen atorvastatin (LIPITOR) 80 MG tablet TAKE 1 TABLET EACH EVENING  . Cholecalciferol (VITAMIN D3) 50 MCG (2000 UT) TABS Take 4,000 Units by mouth daily.  . cloNIDine (CATAPRES) 0.1 MG tablet Take 1 tablet (0.1 mg total) by mouth 2 (two) times daily.  . clopidogrel (PLAVIX) 75 MG tablet Take 75 mg by mouth daily.  Marland Kitchen doxazosin (CARDURA) 2 MG tablet Take 2 mg by mouth at bedtime.   . hydrALAZINE (APRESOLINE) 25 MG tablet Take 1 tablet (25 mg total) by mouth every 8 (eight) hours.  . insulin glargine (LANTUS) 100 UNIT/ML injection Inject 20 Units into the skin daily.  . Iron Combinations (IRON COMPLEX PO) Take 65 mg by mouth daily.  Marland Kitchen lisinopril (ZESTRIL) 20 MG tablet Take 1 tablet (20 mg total) by mouth daily.  . metFORMIN (GLUCOPHAGE-XR) 500 MG 24 hr tablet Take 500 mg by mouth every morning.  . Multiple Vitamins-Minerals (EYE VITAMINS PO) Take 1 tablet by mouth daily.  . nitroGLYCERIN (NITROSTAT) 0.4 MG SL tablet Place 1 tablet (0.4 mg total) under the tongue every 5 (five) minutes as needed for chest pain.  Marland Kitchen NOVOFINE 32G X 6 MM MISC daily. as directed  . omeprazole (PRILOSEC) 40 MG capsule Take 40 mg by mouth daily.  . Pancrelipase, Lip-Prot-Amyl, (CREON) 24000-76000 units CPEP Take 1 capsule by mouth in the morning, at noon, and at bedtime.  Marland Kitchen Propylene Glycol (SYSTANE BALANCE) 0.6 % SOLN Place 1 drop into both eyes at bedtime.     Allergies:   Patient has no known allergies.   Social History   Socioeconomic History  . Marital status: Married    Spouse name: Not on file  . Number of children: Not on file  . Years of education: Not on file  .  Highest education level: Not on file  Occupational History  . Not on file  Tobacco Use  . Smoking status: Former Smoker    Types: Cigarettes  . Smokeless tobacco: Never Used  Vaping Use  . Vaping Use: Never used  Substance and Sexual Activity  . Alcohol use: No  . Drug use: No  . Sexual activity: Not on file  Other Topics Concern  . Not on file  Social History Narrative  . Not on file   Social Determinants of Health   Financial Resource Strain: Not on file  Food Insecurity: Not on file  Transportation Needs: Not on file  Physical Activity: Not on file  Stress: Not on file  Social Connections: Not on file     Family History: The patient's family history includes Diabetes in his mother; Heart failure in his father and mother. ROS:   Please see the history of present illness.    All other systems reviewed and are negative.  EKGs/Labs/Other Studies Reviewed:    The following studies were reviewed today:    Recent Labs: 10/01/2020: BUN 42; Creatinine, Ser 2.09; Hemoglobin 10.0; Magnesium 1.9; Platelets 260; Potassium 3.8; Sodium 140; TSH 4.360  Recent Lipid Panel    Component Value Date/Time   CHOL 138 10/01/2020 2354   TRIG 197 (H) 10/01/2020 2354   HDL 32 (L) 10/01/2020 2354   CHOLHDL 4.3 10/01/2020 2354   VLDL 39 10/01/2020 2354   LDLCALC 67 10/01/2020 2354    Physical Exam:    VS:  BP (!) 150/60 (BP Location: Right Arm, Patient Position: Sitting, Cuff Size: Normal)   Pulse 62   Ht 5\' 11"  (1.803 m)   Wt 173 lb (78.5 kg)   SpO2 98%   BMI 24.13 kg/m     Wt Readings from Last 3 Encounters:  10/18/20 173 lb (78.5 kg)  10/15/20 173 lb 6.4 oz (78.7 kg)  09/17/20 175 lb (79.4 kg)     GEN: He appears his age well nourished, well developed in no acute distress HEENT: Normal NECK: No JVD; No carotid bruits LYMPHATICS: No lymphadenopathy CARDIAC: 2 of 6 apical midsystolic murmur aortic stenosis RRR, no rubs, gallops RESPIRATORY:  Clear to auscultation  without rales, wheezing or rhonchi  ABDOMEN: Soft, non-tender, non-distended MUSCULOSKELETAL:  No edema; No deformity  SKIN: Warm and dry NEUROLOGIC:  Alert and oriented x 3 PSYCHIATRIC:  Normal affect    Signed, Troy More, MD  10/18/2020 9:49 AM    [facility]

## 2020-10-18 ENCOUNTER — Ambulatory Visit (INDEPENDENT_AMBULATORY_CARE_PROVIDER_SITE_OTHER): Payer: Medicare Other | Admitting: Cardiology

## 2020-10-18 ENCOUNTER — Encounter: Payer: Self-pay | Admitting: Cardiology

## 2020-10-18 ENCOUNTER — Other Ambulatory Visit: Payer: Self-pay

## 2020-10-18 VITALS — BP 150/60 | HR 62 | Ht 71.0 in | Wt 173.0 lb

## 2020-10-18 DIAGNOSIS — I25119 Atherosclerotic heart disease of native coronary artery with unspecified angina pectoris: Secondary | ICD-10-CM | POA: Diagnosis not present

## 2020-10-18 DIAGNOSIS — I441 Atrioventricular block, second degree: Secondary | ICD-10-CM

## 2020-10-18 DIAGNOSIS — R001 Bradycardia, unspecified: Secondary | ICD-10-CM

## 2020-10-18 DIAGNOSIS — I110 Hypertensive heart disease with heart failure: Secondary | ICD-10-CM | POA: Diagnosis not present

## 2020-10-18 NOTE — Patient Instructions (Addendum)
Medication Instructions:  Your physician recommends that you continue on your current medications as directed. Please refer to the Current Medication list given to you today.  *If you need a refill on your cardiac medications before your next appointment, please call your pharmacy*   Lab Work: None If you have labs (blood work) drawn today and your tests are completely normal, you will receive your results only by: . MyChart Message (if you have MyChart) OR . A paper copy in the mail If you have any lab test that is abnormal or we need to change your treatment, we will call you to review the results.   Testing/Procedures: None   Follow-Up: At [facility], you and your health needs are our priority.  As part of our continuing mission to provide you with exceptional heart care, we have created designated Provider Care Teams.  These Care Teams include your primary Cardiologist (physician) and Advanced Practice Providers (APPs -  Physician Assistants and Nurse Practitioners) who all work together to provide you with the care you need, when you need it.  We recommend signing up for the patient portal called "MyChart".  Sign up information is provided on this After Visit Summary.  MyChart is used to connect with patients for Virtual Visits (Telemedicine).  Patients are able to view lab/test results, encounter notes, upcoming appointments, etc.  Non-urgent messages can be sent to your provider as well.   To learn more about what you can do with MyChart, go to [email or web address].    Your next appointment:   6 month(s)  The format for your next appointment:   In Person  Provider:   Brian Munley, MD   Other Instructions  KardiaMobile [email or web address]        FDA-cleared, clinical grade mobile EKG monitor: Kardia is the most clinically-validated mobile EKG used by the world's leading cardiac care medical professionals With Basic service, know  instantly if your heart rhythm is normal or if atrial fibrillation is detected, and email the last single EKG recording to yourself or your doctor Premium service, available for purchase through the Kardia app for $9.99 per month or $99 per year, includes unlimited history and storage of your EKG recordings, a monthly EKG summary report to share with your doctor, along with the ability to track your blood pressure, activity and weight Includes one KardiaMobile phone clip FREE SHIPPING: Standard delivery 1-3 business days. Orders placed by 11:00am PST will ship that afternoon. Otherwise, will ship next business day. All orders ship via USPS Priority Mail from [place], [place]     

## 2020-10-24 DIAGNOSIS — E7849 Other hyperlipidemia: Secondary | ICD-10-CM | POA: Diagnosis not present

## 2020-10-24 DIAGNOSIS — N1832 Chronic kidney disease, stage 3b: Secondary | ICD-10-CM | POA: Diagnosis not present

## 2020-10-24 DIAGNOSIS — K219 Gastro-esophageal reflux disease without esophagitis: Secondary | ICD-10-CM | POA: Diagnosis not present

## 2020-10-24 DIAGNOSIS — E1122 Type 2 diabetes mellitus with diabetic chronic kidney disease: Secondary | ICD-10-CM | POA: Diagnosis not present

## 2020-10-29 DIAGNOSIS — J432 Centrilobular emphysema: Secondary | ICD-10-CM | POA: Diagnosis not present

## 2020-10-29 DIAGNOSIS — R918 Other nonspecific abnormal finding of lung field: Secondary | ICD-10-CM | POA: Diagnosis not present

## 2020-10-29 DIAGNOSIS — I2510 Atherosclerotic heart disease of native coronary artery without angina pectoris: Secondary | ICD-10-CM | POA: Diagnosis not present

## 2020-10-29 DIAGNOSIS — I700 Atherosclerosis of aorta: Secondary | ICD-10-CM | POA: Diagnosis not present

## 2020-10-29 DIAGNOSIS — Z23 Encounter for immunization: Secondary | ICD-10-CM | POA: Diagnosis not present

## 2020-11-05 DIAGNOSIS — R911 Solitary pulmonary nodule: Secondary | ICD-10-CM | POA: Diagnosis not present

## 2020-11-05 HISTORY — DX: Solitary pulmonary nodule: R91.1

## 2020-11-12 ENCOUNTER — Telehealth: Payer: Self-pay | Admitting: Cardiology

## 2020-11-12 MED ORDER — AMLODIPINE BESYLATE 5 MG PO TABS: 5.0000 mg | ORAL_TABLET | Freq: Two times a day (BID) | ORAL | 3 refills | Status: DC

## 2020-11-12 NOTE — Telephone Encounter (Signed)
His sound is aortic valve stenosis Not due to BP Increase amlodipine to 5 mg bid

## 2020-11-12 NOTE — Telephone Encounter (Signed)
Spoke to the patients wife just now and let her know Dr. Joya Gaskins recommendations. She verbalizes understanding and thanks me for calling her back.

## 2020-11-12 NOTE — Telephone Encounter (Signed)
Pt c/o BP issue: STAT if pt c/o blurred vision, one-sided weakness or slurred speech  1. What are your last 5 BP readings? 170/71 this morning HR 71, ranges 190-200 at night  2. Are you having any other symptoms (ex. Dizziness, headache, blurred vision, passed out)? Dizziness a lot of the time  3. What is your BP issue? Patient's wife states the patient's BP has been high. She states it gets worse at night. She states he gets dizziness and his eyes are blood shot. She also states his heart murmer is swooshing now. Has given extra pills. As day goes on it gets worse.

## 2020-11-12 NOTE — Telephone Encounter (Signed)
Spoke to the patients wife just now and she let me know that before taking his medications this morning his blood pressure was 170/71. He took his medications this morning around 7:30 am but right now he is walking on the treadmill. She states that in the evening his blood pressure will be in the 897-847 range systolic.   I advised that they should be taking his blood pressure about an hour or two after his medications.   She states that his heart murmur is making a "swooshing" noise when she listens for it. She thinks that this is a lot louder compared to what it used to be. She has some furosemide pills that she states he is not supposed to be taking but when she gives them to him the heart murmur noise goes away.   Denies any shortness of breath but states that he does get some chest tightness at times. However he has high anxiety and they do not consider this to be chest pain.   I will route to Dr. Bettina Gavia to advise as this is a lot of information/concerns.

## 2020-11-12 NOTE — Addendum Note (Signed)
Addended by: Resa Miner I on: 11/12/2020 11:03 AM   Modules accepted: Orders

## 2020-11-14 ENCOUNTER — Telehealth: Payer: Self-pay | Admitting: Cardiology

## 2020-11-14 NOTE — Telephone Encounter (Signed)
BP is currently 169/59 HR 81  Wife was asking could his kidneys be causing his BP to be elevated.

## 2020-11-14 NOTE — Telephone Encounter (Signed)
Pt c/o BP issue: STAT if pt c/o blurred vision, one-sided weakness or slurred speech  1. What are your last 5 BP readings? 212/107 right arm 181/121 left arm  2. Are you having any other symptoms (ex. Dizziness, headache, blurred vision, passed out)? Pt didn't complain of anything per [facility]  3. What is your BP issue? Pt's BP was high in the Dr.'s office so they sent him home. I spoke with [facility] at Dr. [place] [facility]  Pt is Not Currently at the office Caller: [facility] at Dr. Edwin Cap Office

## 2020-11-14 NOTE — Telephone Encounter (Signed)
Yes  He has very difficult to control hypertension were limited in choices of medications with his bradycardia.  I think it be a good idea for him to get back in to see his nephrologist at [place].

## 2020-11-14 NOTE — Telephone Encounter (Signed)
Recommendations reviewed with Troy [name] per DPR as per Dr. Joya Gaskins note.  [name] verbalized understanding and had no additional questions.

## 2020-11-19 DIAGNOSIS — E1122 Type 2 diabetes mellitus with diabetic chronic kidney disease: Secondary | ICD-10-CM | POA: Diagnosis not present

## 2020-11-19 DIAGNOSIS — E782 Mixed hyperlipidemia: Secondary | ICD-10-CM | POA: Diagnosis not present

## 2020-11-19 DIAGNOSIS — N1832 Chronic kidney disease, stage 3b: Secondary | ICD-10-CM | POA: Diagnosis not present

## 2020-11-19 DIAGNOSIS — Z0000 Encounter for general adult medical examination without abnormal findings: Secondary | ICD-10-CM | POA: Diagnosis not present

## 2020-11-19 DIAGNOSIS — Z794 Long term (current) use of insulin: Secondary | ICD-10-CM | POA: Diagnosis not present

## 2020-11-24 DIAGNOSIS — E1122 Type 2 diabetes mellitus with diabetic chronic kidney disease: Secondary | ICD-10-CM | POA: Diagnosis not present

## 2020-11-24 DIAGNOSIS — E7849 Other hyperlipidemia: Secondary | ICD-10-CM | POA: Diagnosis not present

## 2020-11-24 DIAGNOSIS — K219 Gastro-esophageal reflux disease without esophagitis: Secondary | ICD-10-CM | POA: Diagnosis not present

## 2020-11-24 DIAGNOSIS — N1832 Chronic kidney disease, stage 3b: Secondary | ICD-10-CM | POA: Diagnosis not present

## 2020-12-20 DIAGNOSIS — E119 Type 2 diabetes mellitus without complications: Secondary | ICD-10-CM | POA: Diagnosis not present

## 2020-12-20 DIAGNOSIS — H353231 Exudative age-related macular degeneration, bilateral, with active choroidal neovascularization: Secondary | ICD-10-CM | POA: Diagnosis not present

## 2020-12-20 DIAGNOSIS — H43813 Vitreous degeneration, bilateral: Secondary | ICD-10-CM | POA: Diagnosis not present

## 2020-12-24 DIAGNOSIS — N1832 Chronic kidney disease, stage 3b: Secondary | ICD-10-CM | POA: Diagnosis not present

## 2020-12-24 DIAGNOSIS — E7849 Other hyperlipidemia: Secondary | ICD-10-CM | POA: Diagnosis not present

## 2020-12-24 DIAGNOSIS — K219 Gastro-esophageal reflux disease without esophagitis: Secondary | ICD-10-CM | POA: Diagnosis not present

## 2020-12-24 DIAGNOSIS — E1122 Type 2 diabetes mellitus with diabetic chronic kidney disease: Secondary | ICD-10-CM | POA: Diagnosis not present

## 2021-01-14 DIAGNOSIS — E118 Type 2 diabetes mellitus with unspecified complications: Secondary | ICD-10-CM | POA: Diagnosis not present

## 2021-01-14 DIAGNOSIS — Z79899 Other long term (current) drug therapy: Secondary | ICD-10-CM | POA: Diagnosis not present

## 2021-01-14 DIAGNOSIS — C61 Malignant neoplasm of prostate: Secondary | ICD-10-CM | POA: Diagnosis not present

## 2021-01-15 DIAGNOSIS — N183 Chronic kidney disease, stage 3 unspecified: Secondary | ICD-10-CM

## 2021-01-15 DIAGNOSIS — Z794 Long term (current) use of insulin: Secondary | ICD-10-CM | POA: Diagnosis not present

## 2021-01-15 DIAGNOSIS — E1122 Type 2 diabetes mellitus with diabetic chronic kidney disease: Secondary | ICD-10-CM | POA: Insufficient documentation

## 2021-01-15 DIAGNOSIS — Z8546 Personal history of malignant neoplasm of prostate: Secondary | ICD-10-CM | POA: Insufficient documentation

## 2021-01-15 DIAGNOSIS — N1832 Chronic kidney disease, stage 3b: Secondary | ICD-10-CM | POA: Diagnosis not present

## 2021-01-15 DIAGNOSIS — I350 Nonrheumatic aortic (valve) stenosis: Secondary | ICD-10-CM

## 2021-01-15 DIAGNOSIS — N184 Chronic kidney disease, stage 4 (severe): Secondary | ICD-10-CM | POA: Insufficient documentation

## 2021-01-15 DIAGNOSIS — I129 Hypertensive chronic kidney disease with stage 1 through stage 4 chronic kidney disease, or unspecified chronic kidney disease: Secondary | ICD-10-CM | POA: Insufficient documentation

## 2021-01-15 DIAGNOSIS — D649 Anemia, unspecified: Secondary | ICD-10-CM | POA: Diagnosis not present

## 2021-01-15 HISTORY — DX: Chronic kidney disease, stage 3 unspecified: N18.30

## 2021-01-15 HISTORY — DX: Hypertensive chronic kidney disease with stage 1 through stage 4 chronic kidney disease, or unspecified chronic kidney disease: N18.4

## 2021-01-15 HISTORY — DX: Hypertensive chronic kidney disease with stage 1 through stage 4 chronic kidney disease, or unspecified chronic kidney disease: I12.9

## 2021-01-15 HISTORY — DX: Type 2 diabetes mellitus with diabetic chronic kidney disease: E11.22

## 2021-01-15 HISTORY — DX: Nonrheumatic aortic (valve) stenosis: I35.0

## 2021-01-15 HISTORY — DX: Personal history of malignant neoplasm of prostate: Z85.46

## 2021-01-17 ENCOUNTER — Encounter: Payer: Self-pay | Admitting: Podiatry

## 2021-01-17 ENCOUNTER — Other Ambulatory Visit: Payer: Self-pay

## 2021-01-17 ENCOUNTER — Ambulatory Visit (INDEPENDENT_AMBULATORY_CARE_PROVIDER_SITE_OTHER): Payer: Medicare Other | Admitting: Podiatry

## 2021-01-17 DIAGNOSIS — M79675 Pain in left toe(s): Secondary | ICD-10-CM

## 2021-01-17 DIAGNOSIS — M79674 Pain in right toe(s): Secondary | ICD-10-CM

## 2021-01-17 DIAGNOSIS — E1140 Type 2 diabetes mellitus with diabetic neuropathy, unspecified: Secondary | ICD-10-CM | POA: Diagnosis not present

## 2021-01-17 DIAGNOSIS — B351 Tinea unguium: Secondary | ICD-10-CM | POA: Diagnosis not present

## 2021-01-20 NOTE — Progress Notes (Signed)
  Subjective:  Patient ID: Troy [name], male    DOB: 1935-12-28,  MRN: [id number]  85 y.o. male presents with at risk foot care with history of diabetic neuropathy and painful thick toenails that are difficult to trim. Pain interferes with ambulation. Aggravating factors include wearing enclosed shoe gear. Pain is relieved with periodic professional debridement.  He has macular degeneration and no longer drives. His wife brought him to his appointment today and is sitting in the waiting room. He voices no new pedal problems on today's visit.  Patient's blood sugar was 112 mg/dl today.  PCP: Street, [name], MD and last visit was: 07/26/2020.  Review of Systems: Negative except as noted in the HPI.   No Known Allergies  Objective:  There were no vitals filed for this visit. Constitutional Patient is a pleasant 85 y.o. Caucasian male in NAD. AAO x 3.  Vascular Capillary refill time to digits <4 seconds b/l lower extremities. Faintly palpable pedal pulses b/l. Pedal hair sparse. Lower extremity skin temperature gradient within normal limits. No pain with calf compression b/l. Varicosities present b/l. No cyanosis or clubbing noted.  Neurologic Normal speech. Protective sensation diminished with 10g monofilament b/l.  Dermatologic Pedal skin with normal turgor, texture and tone b/l lower extremities No open wounds b/l lower extremities No interdigital macerations b/l lower extremities Toenails 1-5 b/l elongated, discolored, dystrophic, thickened, crumbly with subungual debris and tenderness to dorsal palpation. There is evidence of subacute subungual hematoma of the L 2nd toe. Nailplate remains adhered. There is no  tenderness to palpation.  Orthopedic: Normal muscle strength 5/5 to all lower extremity muscle groups bilaterally. No pain crepitus or joint limitation noted with ROM b/l. No gross bony deformities bilaterally.    Assessment:   1. Pain due to onychomycosis of toenails of both  feet   2. Type 2 diabetes mellitus with diabetic neuropathy, unspecified whether long term insulin use ([place])    Plan:   -Examined patient. -Continue diabetic foot care principles. -Patient to continue soft, supportive shoe gear daily. -Toenails 1-5 b/l were debrided in length and girth with sterile nail nippers and dremel without iatrogenic bleeding.  -Patient to report any pedal injuries to medical professional immediately. -Patient/POA to call should there be question/concern in the interim.  Return in about 3 months (around 04/19/2021).  Marzetta Board, DPM

## 2021-01-24 DIAGNOSIS — K219 Gastro-esophageal reflux disease without esophagitis: Secondary | ICD-10-CM | POA: Diagnosis not present

## 2021-01-24 DIAGNOSIS — N1832 Chronic kidney disease, stage 3b: Secondary | ICD-10-CM | POA: Diagnosis not present

## 2021-01-24 DIAGNOSIS — E1122 Type 2 diabetes mellitus with diabetic chronic kidney disease: Secondary | ICD-10-CM | POA: Diagnosis not present

## 2021-01-24 DIAGNOSIS — E7849 Other hyperlipidemia: Secondary | ICD-10-CM | POA: Diagnosis not present

## 2021-01-26 ENCOUNTER — Other Ambulatory Visit: Payer: Self-pay | Admitting: Cardiology

## 2021-01-30 ENCOUNTER — Other Ambulatory Visit: Payer: Self-pay

## 2021-01-30 NOTE — Telephone Encounter (Signed)
Clonidine 0.1 mg # 180 x 3 refills sent to  [facility] - [place], [place] - [address]

## 2021-02-13 DIAGNOSIS — H353231 Exudative age-related macular degeneration, bilateral, with active choroidal neovascularization: Secondary | ICD-10-CM | POA: Diagnosis not present

## 2021-02-24 DIAGNOSIS — N1832 Chronic kidney disease, stage 3b: Secondary | ICD-10-CM | POA: Diagnosis not present

## 2021-02-24 DIAGNOSIS — E1122 Type 2 diabetes mellitus with diabetic chronic kidney disease: Secondary | ICD-10-CM | POA: Diagnosis not present

## 2021-02-24 DIAGNOSIS — K219 Gastro-esophageal reflux disease without esophagitis: Secondary | ICD-10-CM | POA: Diagnosis not present

## 2021-02-24 DIAGNOSIS — E7849 Other hyperlipidemia: Secondary | ICD-10-CM | POA: Diagnosis not present

## 2021-03-21 DIAGNOSIS — E119 Type 2 diabetes mellitus without complications: Secondary | ICD-10-CM | POA: Diagnosis not present

## 2021-03-21 DIAGNOSIS — H353132 Nonexudative age-related macular degeneration, bilateral, intermediate dry stage: Secondary | ICD-10-CM | POA: Diagnosis not present

## 2021-04-10 DIAGNOSIS — H353212 Exudative age-related macular degeneration, right eye, with inactive choroidal neovascularization: Secondary | ICD-10-CM | POA: Diagnosis not present

## 2021-04-10 DIAGNOSIS — H353221 Exudative age-related macular degeneration, left eye, with active choroidal neovascularization: Secondary | ICD-10-CM | POA: Diagnosis not present

## 2021-04-10 DIAGNOSIS — D649 Anemia, unspecified: Secondary | ICD-10-CM | POA: Diagnosis not present

## 2021-04-17 DIAGNOSIS — E1122 Type 2 diabetes mellitus with diabetic chronic kidney disease: Secondary | ICD-10-CM | POA: Diagnosis not present

## 2021-04-17 DIAGNOSIS — N184 Chronic kidney disease, stage 4 (severe): Secondary | ICD-10-CM | POA: Diagnosis not present

## 2021-04-17 DIAGNOSIS — I129 Hypertensive chronic kidney disease with stage 1 through stage 4 chronic kidney disease, or unspecified chronic kidney disease: Secondary | ICD-10-CM | POA: Diagnosis not present

## 2021-04-17 DIAGNOSIS — Z794 Long term (current) use of insulin: Secondary | ICD-10-CM | POA: Diagnosis not present

## 2021-04-17 DIAGNOSIS — D631 Anemia in chronic kidney disease: Secondary | ICD-10-CM | POA: Diagnosis not present

## 2021-04-19 DIAGNOSIS — Z23 Encounter for immunization: Secondary | ICD-10-CM | POA: Diagnosis not present

## 2021-04-26 DIAGNOSIS — N1832 Chronic kidney disease, stage 3b: Secondary | ICD-10-CM | POA: Diagnosis not present

## 2021-04-26 DIAGNOSIS — E1122 Type 2 diabetes mellitus with diabetic chronic kidney disease: Secondary | ICD-10-CM | POA: Diagnosis not present

## 2021-04-26 DIAGNOSIS — K219 Gastro-esophageal reflux disease without esophagitis: Secondary | ICD-10-CM | POA: Diagnosis not present

## 2021-04-26 DIAGNOSIS — Z23 Encounter for immunization: Secondary | ICD-10-CM | POA: Diagnosis not present

## 2021-04-26 DIAGNOSIS — E7849 Other hyperlipidemia: Secondary | ICD-10-CM | POA: Diagnosis not present

## 2021-05-02 ENCOUNTER — Other Ambulatory Visit: Payer: Self-pay

## 2021-05-02 ENCOUNTER — Encounter: Payer: Self-pay | Admitting: Podiatry

## 2021-05-02 ENCOUNTER — Ambulatory Visit (INDEPENDENT_AMBULATORY_CARE_PROVIDER_SITE_OTHER): Payer: Medicare Other | Admitting: Podiatry

## 2021-05-02 DIAGNOSIS — B351 Tinea unguium: Secondary | ICD-10-CM

## 2021-05-02 DIAGNOSIS — E1140 Type 2 diabetes mellitus with diabetic neuropathy, unspecified: Secondary | ICD-10-CM | POA: Diagnosis not present

## 2021-05-02 DIAGNOSIS — M79675 Pain in left toe(s): Secondary | ICD-10-CM | POA: Diagnosis not present

## 2021-05-02 DIAGNOSIS — M79674 Pain in right toe(s): Secondary | ICD-10-CM | POA: Diagnosis not present

## 2021-05-03 ENCOUNTER — Telehealth: Payer: Self-pay | Admitting: Cardiology

## 2021-05-03 NOTE — Telephone Encounter (Signed)
Spoke with pt's wife and pt has had exertional CP off and on for about 3 weeks with last nights episode being the worst Pt's wife had to give him a ntg  with relief . B/P was elevated running at 185/85 and 5 min  later B/P was 112/54 Pt has appt later this month 05/16/21 at 10:20 am  Instructed if pt has worsening or increase in S/S to go to nearest ED for eval and tx Verbalized understanding .Will forward to Dr Bettina Gavia for review and recommendations /cy

## 2021-05-03 NOTE — Telephone Encounter (Signed)
Spoke to the patients wife just now and let her know Dr. Joya Gaskins recommendations. She verbalizes understanding and thanks me for calling back.

## 2021-05-03 NOTE — Telephone Encounter (Signed)
Pt c/o of Chest Pain: STAT if CP now or developed within 24 hours  1. Are you having CP right now? No   2. Are you experiencing any other symptoms (ex. SOB, nausea, vomiting, sweating)? No   3. How long have you been experiencing CP? 2 to 3 weeks upon exertion   4. Is your CP continuous or coming and going? Coming and going   5. Have you taken Nitroglycerin? Yes, last taken last night  ?   BP was 185/85 when he took the Nitro last night after 5 minutes it went to 112/54.

## 2021-05-08 NOTE — Progress Notes (Signed)
  Subjective:  Patient ID: Troy [name], male    DOB: August 14, 1935,  MRN: [id number]  85 y.o. male presents at risk foot care with history of diabetic neuropathy and thick, elongated toenails b/l feet which are tender when wearing enclosed shoe gear.  Patient states blood glucose was 143 mg/dl today.    PCP is Street, [name], MD , and last visit was 01/14/2021.  No Known Allergies  Review of Systems: Negative except as noted in the HPI.   Objective:  Vascular Examination: Capillary refill time to digits <4 seconds b/l lower extremities. Faintly palpable DP pulse(s) b/l lower extremities. Faintly palpable PT pulse(s) b/l lower extremities. Pedal hair absent. Lower extremity skin temperature gradient within normal limits. No pain with calf compression b/l. No edema noted b/l lower extremities. Varicosities present b/l.  Neurological Examination: Protective sensation diminished with 10g monofilament b/l.Marland Kitchen   Dermatological Examination: Skin warm and supple b/l lower extremities. No open wounds b/l LE. Toenails 1-5 b/l elongated, discolored, dystrophic, thickened, crumbly with subungual debris and tenderness to dorsal palpation.  Musculoskeletal Examination: No gross bony deformities b/l lower extremities. Muscle strength 5/5 to b/l LE.  Radiographs: None Assessment:   1. Pain due to onychomycosis of toenails of both feet   2. Type 2 diabetes mellitus with diabetic neuropathy, unspecified whether long term insulin use ([place])    Plan:  -No new findings. No new orders. -Continue diabetic foot care principles: inspect feet daily, monitor glucose as recommended by PCP and/or Endocrinologist, and follow prescribed diet per PCP, Endocrinologist and/or dietician. -Patient to continue soft, supportive shoe gear daily. -Toenails 1-5 b/l were debrided in length and girth with sterile nail nippers and dremel without iatrogenic bleeding.  -Patient to report any pedal injuries to medical  professional immediately. -Patient/POA to call should there be question/concern in the interim.  Return in about 3 months (around 08/02/2021).  Marzetta Board, DPM

## 2021-05-15 NOTE — Progress Notes (Signed)
Cardiology Office Note:    Date:  05/16/2021   ID:  Troy [name], DOB January 28, 1936, MRN [id number]  PCP:  Street, [name], MD  Cardiologist:  Shirlee More, MD    Referring MD: [address] [name], *    ASSESSMENT:    1. Coronary artery disease involving native coronary artery of native heart with angina pectoris ([place])   2. Hypertensive heart disease with heart failure ([place])   3. Mobitz (type) I (Wenckebach's) atrioventricular block   4. Nonrheumatic aortic valve stenosis   5. Pure hypercholesterolemia    PLAN:    In order of problems listed above:  Stable CAD I think the issue of the day of prolonged chest pain was related to upper extremities Avastin in the future please, do these kind of activities to take nitroglycerin ahead of time continue his current medical treatment he is no longer on a beta-blocker with second-degree AV block I be very hesitant to do cardiac invasive procedures or intervention with his severe CKD the patient and his wife agree. Stable she trends blood pressures at home generally are in range we will continue his multidrug regimen including amlodipine centrally active clonidine alpha-blocker Cardura ACE inhibitor lisinopril. No recurrence avoid beta-blockers Stable mild aortic stenosis Lipids at target continue his high intensity statin   Next appointment: 3 months   Medication Adjustments/Labs and Tests Ordered: Current medicines are reviewed at length with the patient today.  Concerns regarding medicines are outlined above.  Orders Placed This Encounter  Procedures   EKG 12-Lead   No orders of the defined types were placed in this encounter.  Chief complaint follow-up CAD Brought in orally at the request of patient's wife he had an episode of angina   History of Present Illness:    Troy [name] is a 84 y.o. male with a hx of hypertensive heart disease with heart failure and CKD CAD with previous PCI mild aortic stenosis and previous  symptomatic bradycardia resolved by stopping beta-blocker.  He was last seen 10/18/2020.Echocardiogram 10/02/2020 showed normal left ventricular systolic function normal filling pressures and mild aortic stenosis.  He continues to follow with [facility] cardiology for his stage IV CKD.  Recent BMP 04/10/2021 shows potassium 4.8 sodium 140 creatinine 2.50 with a GFR 25 cc/min  Compliance with diet, lifestyle and medications: Yes  Overall he is doing well he walks a mile a treadmill every day and has very little exertional angina shortness of breath palpitation or syncope. He had an episode where he brought the trash can not to drive and then went out to a building he was using his upper extremities moving boxes and had angina requiring nitroglycerin. His EKG today shows LVH repolarization. He has had no recurrent bradycardia or second-degree AV block. No edema shortness of breath palpitation or syncope. Past Medical History:  Diagnosis Date   Anemia 12/09/2015   CAD in native artery 12/09/2015   Overview:  PCI/ stent 2001   Chronic diastolic heart failure ([place]) 12/09/2015   Coronary artery disease involving native coronary artery of native heart with angina pectoris ([place]) 12/09/2015   Overview:  PCI/ stent 2001   Hyperlipidemia 12/09/2015   Hypertensive heart disease with heart failure ([place]) 12/09/2015   Necrotizing pancreatitis 06/26/2015   Pseudocyst of pancreas 06/26/2015    Past Surgical History:  Procedure Laterality Date   ABDOMINAL AORTOGRAM W/LOWER EXTREMITY Bilateral 09/10/2018   Procedure: ABDOMINAL AORTOGRAM W/LOWER EXTREMITY;  Surgeon: Elam Dutch, MD;  Location: [place]  CV LAB;  Service: Cardiovascular;  Laterality: Bilateral;   CHOLECYSTECTOMY     PERIPHERAL VASCULAR INTERVENTION Right 09/10/2018   Procedure: PERIPHERAL VASCULAR INTERVENTION;  Surgeon: Elam Dutch, MD;  Location: [place] [facility] LAB;  Service: Cardiovascular;  Laterality: Right;  common iliac    TONSILLECTOMY AND ADENOIDECTOMY     TOTAL KNEE REVISION Bilateral     Current Medications: Current Meds  Medication Sig   amLODipine (NORVASC) 5 MG tablet Take 1 tablet (5 mg total) by mouth in the morning and at bedtime.   ascorbic acid (VITAMIN C) 500 MG/5ML syrup Take 500 mg by mouth daily.   atorvastatin (LIPITOR) 80 MG tablet TAKE 1 TABLET EACH EVENING   B Complex Vitamins (VITAMIN B COMPLEX) TABS Take 1 tablet by mouth daily.   Cholecalciferol (VITAMIN D3) 50 MCG (2000 UT) TABS Take 4,000 Units by mouth daily.   Cinnamon 500 MG capsule Take 500 mg by mouth daily.   cloNIDine (CATAPRES) 0.1 MG tablet Take 1 tablet (0.1 mg total) by mouth 2 (two) times daily.   clopidogrel (PLAVIX) 75 MG tablet Take 75 mg by mouth daily.   co-enzyme Q-10 30 MG capsule Take 30 mg by mouth daily.   dapagliflozin propanediol (FARXIGA) 10 MG TABS tablet Take 10 mg by mouth daily.   doxazosin (CARDURA) 2 MG tablet Take 2 mg by mouth at bedtime.    ferrous sulfate 325 (65 FE) MG EC tablet Take 325 mg by mouth daily with breakfast.   hydrALAZINE (APRESOLINE) 25 MG tablet Take 1 tablet (25 mg total) by mouth every 8 (eight) hours.   insulin glargine (LANTUS) 100 UNIT/ML injection Inject 20 Units into the skin daily.   Iron Combinations (IRON COMPLEX PO) Take 65 mg by mouth daily.   lisinopril (ZESTRIL) 20 MG tablet Take 20 mg by mouth daily.   Multiple Vitamins-Minerals (EYE VITAMINS PO) Take 1 tablet by mouth daily.   nitroGLYCERIN (NITROSTAT) 0.4 MG SL tablet Place 1 tablet (0.4 mg total) under the tongue every 5 (five) minutes as needed for chest pain.   omeprazole (PRILOSEC) 40 MG capsule Take 40 mg by mouth daily.   Pancrelipase, Lip-Prot-Amyl, (CREON) 24000-76000 units CPEP Take 1 capsule by mouth in the morning, at noon, and at bedtime.   Propylene Glycol (SYSTANE BALANCE) 0.6 % SOLN Place 1 drop into both eyes at bedtime.     Allergies:   Patient has no known allergies.   Social History    Socioeconomic History   Marital status: Married    Spouse name: Not on file   Number of children: Not on file   Years of education: Not on file   Highest education level: Not on file  Occupational History   Not on file  Tobacco Use   Smoking status: Former    Types: Cigarettes   Smokeless tobacco: Never  Vaping Use   Vaping Use: Never used  Substance and Sexual Activity   Alcohol use: No   Drug use: No   Sexual activity: Not on file  Other Topics Concern   Not on file  Social History Narrative   Not on file   Social Determinants of Health   Financial Resource Strain: Not on file  Food Insecurity: Not on file  Transportation Needs: Not on file  Physical Activity: Not on file  Stress: Not on file  Social Connections: Not on file     Family History: The patient's family history includes Diabetes in his mother; Heart failure in  his father and mother. ROS:   Please see the history of present illness.    All other systems reviewed and are negative.  EKGs/Labs/Other Studies Reviewed:    The following studies were reviewed today:  EKG:  EKG ordered today and personally reviewed.  The ekg ordered today demonstrates sinus rhythm LVH voltage repolarization first-degree AV block one-to-one conduction  Recent Labs: 10/01/2020: BUN 42; Creatinine, Ser 2.09; Hemoglobin 10.0; Magnesium 1.9; Platelets 260; Potassium 3.8; Sodium 140; TSH 4.360  Recent Lipid Panel    Component Value Date/Time   CHOL 138 10/01/2020 2354   TRIG 197 (H) 10/01/2020 2354   HDL 32 (L) 10/01/2020 2354   CHOLHDL 4.3 10/01/2020 2354   VLDL 39 10/01/2020 2354   LDLCALC 67 10/01/2020 2354    Physical Exam:    VS:  BP (!) 142/52   Pulse 70   Ht 5\' 9"  (1.753 m)   Wt 173 lb (78.5 kg)   SpO2 98%   BMI 25.55 kg/m     Wt Readings from Last 3 Encounters:  05/16/21 173 lb (78.5 kg)  10/18/20 173 lb (78.5 kg)  10/15/20 173 lb 6.4 oz (78.7 kg)     GEN:  Well nourished, well developed in no acute  distress HEENT: Normal NECK: No JVD; No carotid bruits LYMPHATICS: No lymphadenopathy CARDIAC: RRR, grade 1/6 AAS murmur aortic area S2 normal no aortic regurgitation rubs, gallops RESPIRATORY:  Clear to auscultation without rales, wheezing or rhonchi  ABDOMEN: Soft, non-tender, non-distended MUSCULOSKELETAL:  No edema; No deformity  SKIN: Warm and dry NEUROLOGIC:  Alert and oriented x 3 PSYCHIATRIC:  Normal affect    Signed, Shirlee More, MD  05/16/2021 10:49 AM    [place]

## 2021-05-16 ENCOUNTER — Encounter: Payer: Self-pay | Admitting: Cardiology

## 2021-05-16 ENCOUNTER — Ambulatory Visit (INDEPENDENT_AMBULATORY_CARE_PROVIDER_SITE_OTHER): Payer: Medicare Other | Admitting: Cardiology

## 2021-05-16 ENCOUNTER — Other Ambulatory Visit: Payer: Self-pay

## 2021-05-16 VITALS — BP 142/52 | HR 70 | Ht 69.0 in | Wt 173.0 lb

## 2021-05-16 DIAGNOSIS — I350 Nonrheumatic aortic (valve) stenosis: Secondary | ICD-10-CM

## 2021-05-16 DIAGNOSIS — I110 Hypertensive heart disease with heart failure: Secondary | ICD-10-CM

## 2021-05-16 DIAGNOSIS — I25119 Atherosclerotic heart disease of native coronary artery with unspecified angina pectoris: Secondary | ICD-10-CM

## 2021-05-16 DIAGNOSIS — E7800 Pure hypercholesterolemia, unspecified: Secondary | ICD-10-CM

## 2021-05-16 DIAGNOSIS — I441 Atrioventricular block, second degree: Secondary | ICD-10-CM | POA: Diagnosis not present

## 2021-05-16 MED ORDER — NITROGLYCERIN 0.4 MG SL SUBL: 0.4000 mg | SUBLINGUAL_TABLET | SUBLINGUAL | 4 refills | Status: DC | PRN

## 2021-05-16 NOTE — Patient Instructions (Signed)

## 2021-05-27 DIAGNOSIS — E782 Mixed hyperlipidemia: Secondary | ICD-10-CM | POA: Diagnosis not present

## 2021-05-27 DIAGNOSIS — E1122 Type 2 diabetes mellitus with diabetic chronic kidney disease: Secondary | ICD-10-CM | POA: Diagnosis not present

## 2021-05-27 DIAGNOSIS — N1832 Chronic kidney disease, stage 3b: Secondary | ICD-10-CM | POA: Diagnosis not present

## 2021-06-05 DIAGNOSIS — H353221 Exudative age-related macular degeneration, left eye, with active choroidal neovascularization: Secondary | ICD-10-CM | POA: Diagnosis not present

## 2021-06-05 DIAGNOSIS — H353212 Exudative age-related macular degeneration, right eye, with inactive choroidal neovascularization: Secondary | ICD-10-CM | POA: Diagnosis not present

## 2021-06-26 DIAGNOSIS — N1832 Chronic kidney disease, stage 3b: Secondary | ICD-10-CM | POA: Diagnosis not present

## 2021-06-26 DIAGNOSIS — E1122 Type 2 diabetes mellitus with diabetic chronic kidney disease: Secondary | ICD-10-CM | POA: Diagnosis not present

## 2021-06-26 DIAGNOSIS — E782 Mixed hyperlipidemia: Secondary | ICD-10-CM | POA: Diagnosis not present

## 2021-07-18 DIAGNOSIS — H43813 Vitreous degeneration, bilateral: Secondary | ICD-10-CM | POA: Diagnosis not present

## 2021-07-18 DIAGNOSIS — H353221 Exudative age-related macular degeneration, left eye, with active choroidal neovascularization: Secondary | ICD-10-CM | POA: Diagnosis not present

## 2021-07-18 DIAGNOSIS — H353212 Exudative age-related macular degeneration, right eye, with inactive choroidal neovascularization: Secondary | ICD-10-CM | POA: Diagnosis not present

## 2021-07-21 ENCOUNTER — Other Ambulatory Visit: Payer: Self-pay | Admitting: Student

## 2021-07-26 DIAGNOSIS — E782 Mixed hyperlipidemia: Secondary | ICD-10-CM | POA: Diagnosis not present

## 2021-07-26 DIAGNOSIS — E1122 Type 2 diabetes mellitus with diabetic chronic kidney disease: Secondary | ICD-10-CM | POA: Diagnosis not present

## 2021-07-26 DIAGNOSIS — N1832 Chronic kidney disease, stage 3b: Secondary | ICD-10-CM | POA: Diagnosis not present

## 2021-07-31 DIAGNOSIS — H353231 Exudative age-related macular degeneration, bilateral, with active choroidal neovascularization: Secondary | ICD-10-CM | POA: Diagnosis not present

## 2021-08-14 DIAGNOSIS — Z794 Long term (current) use of insulin: Secondary | ICD-10-CM | POA: Diagnosis not present

## 2021-08-14 DIAGNOSIS — E1122 Type 2 diabetes mellitus with diabetic chronic kidney disease: Secondary | ICD-10-CM | POA: Diagnosis not present

## 2021-08-14 DIAGNOSIS — C61 Malignant neoplasm of prostate: Secondary | ICD-10-CM | POA: Diagnosis not present

## 2021-08-14 DIAGNOSIS — N1832 Chronic kidney disease, stage 3b: Secondary | ICD-10-CM | POA: Diagnosis not present

## 2021-08-14 DIAGNOSIS — R0989 Other specified symptoms and signs involving the circulatory and respiratory systems: Secondary | ICD-10-CM | POA: Diagnosis not present

## 2021-08-16 ENCOUNTER — Encounter: Payer: Self-pay | Admitting: Cardiology

## 2021-08-16 ENCOUNTER — Other Ambulatory Visit: Payer: Self-pay

## 2021-08-16 ENCOUNTER — Ambulatory Visit (INDEPENDENT_AMBULATORY_CARE_PROVIDER_SITE_OTHER): Payer: Medicare Other | Admitting: Cardiology

## 2021-08-16 VITALS — BP 166/66 | HR 62 | Ht 69.0 in | Wt 172.8 lb

## 2021-08-16 DIAGNOSIS — R001 Bradycardia, unspecified: Secondary | ICD-10-CM

## 2021-08-16 DIAGNOSIS — I441 Atrioventricular block, second degree: Secondary | ICD-10-CM

## 2021-08-16 DIAGNOSIS — E7800 Pure hypercholesterolemia, unspecified: Secondary | ICD-10-CM

## 2021-08-16 DIAGNOSIS — I110 Hypertensive heart disease with heart failure: Secondary | ICD-10-CM

## 2021-08-16 DIAGNOSIS — N184 Chronic kidney disease, stage 4 (severe): Secondary | ICD-10-CM | POA: Diagnosis not present

## 2021-08-16 DIAGNOSIS — I25119 Atherosclerotic heart disease of native coronary artery with unspecified angina pectoris: Secondary | ICD-10-CM | POA: Diagnosis not present

## 2021-08-16 DIAGNOSIS — I350 Nonrheumatic aortic (valve) stenosis: Secondary | ICD-10-CM

## 2021-08-16 MED ORDER — HYDRALAZINE HCL 25 MG PO TABS: ORAL_TABLET | ORAL | 2 refills | Status: DC

## 2021-08-16 NOTE — Progress Notes (Signed)
Cardiology Office Note:    Date:  08/16/2021   ID:  Troy [name], DOB [birth year], MRN [id number]  PCP:  Street, [name], MD  Cardiologist:  Shirlee More, MD    Referring MD: [address] [name], *    ASSESSMENT:    1. Hypertensive heart disease with heart failure ([place])   2. Coronary artery disease involving native coronary artery of native heart with angina pectoris ([place])   3. Sinus bradycardia on ECG   4. Mobitz (type) I (Wenckebach's) atrioventricular block   5. Pure hypercholesterolemia   6. Nonrheumatic aortic valve stenosis   7. CKD (chronic kidney disease) stage 4, GFR 15-29 ml/min (HCC)    PLAN:    In order of problems listed above:  Blood pressure is not well controlled borderline extra dose of lisinopril at bedtime daily I think is best served with this with stage I CKD I am concerned about his SGLT2 inhibitor and his wife knows that his GFR falls below 20 and must be discontinued with the risk of ketoacidosis Stable CAD having no angina after PCI and current medical therapy and will continue the same he is not on beta-blocker because of bradycardia symptomatic and second-degree AV block. No further bradycardia or heart block Continue his high intensity statin most recent lipid profile 04/10/2021 cholesterol 115 LDL 54 triglycerides 137 HDL 50 Stable mild aortic stenosis Followed by [facility] nephrology stage IV GFR of 25 cc estimated   Next appointment: 3 months   Medication Adjustments/Labs and Tests Ordered: Current medicines are reviewed at length with the patient today.  Concerns regarding medicines are outlined above.  No orders of the defined types were placed in this encounter.  No orders of the defined types were placed in this encounter.   Chief Complaint  Patient presents with   Follow-up   Congestive Heart Failure   Coronary Artery Disease   Bradycardia    History of Present Illness:    Troy [name] is a 86 y.o. male  retired Company secretary with a hx of hypertensive heart disease with heart failure and stage IV CKD CAD with previous PCI mild aortic stenosis and previous symptomatic bradycardia resolved stopping a beta-blocker.  He is meticulously supervised by his wife and retired cardiology nurse from [employer].  He was last seen 05/16/2021 doing well with no recurrent symptomatic bradycardia or second-degree AV block..  Compliance with diet, lifestyle and medications: Yes  His blood pressure has been elevated in the evenings and his wife's been giving him an extra dose of lisinopril several times a week. With his CKD I think he is best served by taking hydralazine and milligrams start taking an extra tablet at bedtime daily Morning blood pressures are in range He feels better no edema shortness of breath chest pain palpitation or syncope Past Medical History:  Diagnosis Date   Anemia 12/09/2015   CAD in native artery 12/09/2015   Overview:  PCI/ stent 2001   Chronic diastolic heart failure ([place]) 12/09/2015   Coronary artery disease involving native coronary artery of native heart with angina pectoris ([place]) 12/09/2015   Overview:  PCI/ stent 2001   Hyperlipidemia 12/09/2015   Hypertensive heart disease with heart failure ([place]) 12/09/2015   Necrotizing pancreatitis 06/26/2015   Pseudocyst of pancreas 06/26/2015    Past Surgical History:  Procedure Laterality Date   ABDOMINAL AORTOGRAM W/LOWER EXTREMITY Bilateral 09/10/2018   Procedure: ABDOMINAL AORTOGRAM W/LOWER EXTREMITY;  Surgeon: Elam Dutch, MD;  Location: [place]  CV LAB;  Service: Cardiovascular;  Laterality: Bilateral;   CHOLECYSTECTOMY     PERIPHERAL VASCULAR INTERVENTION Right 09/10/2018   Procedure: PERIPHERAL VASCULAR INTERVENTION;  Surgeon: Elam Dutch, MD;  Location: [place] [facility] LAB;  Service: Cardiovascular;  Laterality: Right;  common iliac   TONSILLECTOMY AND ADENOIDECTOMY     TOTAL KNEE REVISION Bilateral     Current  Medications: No outpatient medications have been marked as taking for the 08/16/21 encounter (Office Visit) with Richardo Priest, MD.     Allergies:   Patient has no known allergies.   Social History   Socioeconomic History   Marital status: Married    Spouse name: Not on file   Number of children: Not on file   Years of education: Not on file   Highest education level: Not on file  Occupational History   Not on file  Tobacco Use   Smoking status: Former    Types: Cigarettes    Passive exposure: Past   Smokeless tobacco: Never  Vaping Use   Vaping Use: Never used  Substance and Sexual Activity   Alcohol use: No   Drug use: No   Sexual activity: Not on file  Other Topics Concern   Not on file  Social History Narrative   Not on file   Social Determinants of Health   Financial Resource Strain: Not on file  Food Insecurity: Not on file  Transportation Needs: Not on file  Physical Activity: Not on file  Stress: Not on file  Social Connections: Not on file     Family History: The patient's family history includes Diabetes in his mother; Heart failure in his father and mother. ROS:   Please see the history of present illness.    All other systems reviewed and are negative.  EKGs/Labs/Other Studies Reviewed:    The following studies were reviewed today:    Recent Labs: 10/01/2020: BUN 42; Creatinine, Ser 2.09; Hemoglobin 10.0; Magnesium 1.9; Platelets 260; Potassium 3.8; Sodium 140; TSH 4.360  Recent Lipid Panel    Component Value Date/Time   CHOL 138 10/01/2020 2354   TRIG 197 (H) 10/01/2020 2354   HDL 32 (L) 10/01/2020 2354   CHOLHDL 4.3 10/01/2020 2354   VLDL 39 10/01/2020 2354   LDLCALC 67 10/01/2020 2354    Physical Exam:    VS:  BP (!) 166/66 (BP Location: Right Arm)    Pulse 62    Ht 5\' 9"  (1.753 m)    Wt 172 lb 12.8 oz (78.4 kg)    SpO2 98%    BMI 25.52 kg/m     Wt Readings from Last 3 Encounters:  08/16/21 172 lb 12.8 oz (78.4 kg)  05/16/21 173  lb (78.5 kg)  10/18/20 173 lb (78.5 kg)     GEN: He appears healthier than his last visit looks his age well nourished, well developed in no acute distress HEENT: Normal NECK: No JVD; No carotid bruits LYMPHATICS: No lymphadenopathy CARDIAC: Grade 2/6 aortic outflow murmur S2 splits does not radiate to the carotids mild aortic stenosis RRR, no rubs, gallops RESPIRATORY:  Clear to auscultation without rales, wheezing or rhonchi  ABDOMEN: Soft, non-tender, non-distended MUSCULOSKELETAL:  No edema; No deformity  SKIN: Warm and dry NEUROLOGIC:  Alert and oriented x 3 PSYCHIATRIC:  Normal affect    Signed, Shirlee More, MD  08/16/2021 1:20 PM    [facility]

## 2021-08-16 NOTE — Patient Instructions (Signed)
Medication Instructions:  Your physician has recommended you make the following change in your medication:  INCREASE: Hydralazine take an extra tablet by mouth daily at bedtime.  *If you need a refill on your cardiac medications before your next appointment, please call your pharmacy*   Lab Work: None If you have labs (blood work) drawn today and your tests are completely normal, you will receive your results only by: [place] (if you have MyChart) OR A paper copy in the mail If you have any lab test that is abnormal or we need to change your treatment, we will call you to review the results.   Testing/Procedures: None   Follow-Up: At [facility], you and your health needs are our priority.  As part of our continuing mission to provide you with exceptional heart care, we have created designated Provider Care Teams.  These Care Teams include your primary Cardiologist (physician) and Advanced Practice Providers (APPs -  Physician Assistants and Nurse Practitioners) who all work together to provide you with the care you need, when you need it.  We recommend signing up for the patient portal called "MyChart".  Sign up information is provided on this After Visit Summary.  MyChart is used to connect with patients for Virtual Visits (Telemedicine).  Patients are able to view lab/test results, encounter notes, upcoming appointments, etc.  Non-urgent messages can be sent to your provider as well.   To learn more about what you can do with MyChart, go to [email or web address].    Your next appointment:   3 month(s)  The format for your next appointment:   In Person  Provider:   Shirlee More, MD    Other Instructions

## 2021-08-27 DIAGNOSIS — N1832 Chronic kidney disease, stage 3b: Secondary | ICD-10-CM | POA: Diagnosis not present

## 2021-08-27 DIAGNOSIS — E1122 Type 2 diabetes mellitus with diabetic chronic kidney disease: Secondary | ICD-10-CM | POA: Diagnosis not present

## 2021-08-27 DIAGNOSIS — E782 Mixed hyperlipidemia: Secondary | ICD-10-CM | POA: Diagnosis not present

## 2021-08-29 ENCOUNTER — Ambulatory Visit (INDEPENDENT_AMBULATORY_CARE_PROVIDER_SITE_OTHER): Payer: Medicare Other | Admitting: Podiatry

## 2021-08-29 ENCOUNTER — Encounter: Payer: Self-pay | Admitting: Podiatry

## 2021-08-29 DIAGNOSIS — M79674 Pain in right toe(s): Secondary | ICD-10-CM | POA: Diagnosis not present

## 2021-08-29 DIAGNOSIS — B351 Tinea unguium: Secondary | ICD-10-CM

## 2021-08-29 DIAGNOSIS — E1142 Type 2 diabetes mellitus with diabetic polyneuropathy: Secondary | ICD-10-CM | POA: Diagnosis not present

## 2021-08-29 DIAGNOSIS — M2011 Hallux valgus (acquired), right foot: Secondary | ICD-10-CM | POA: Diagnosis not present

## 2021-08-29 DIAGNOSIS — M2012 Hallux valgus (acquired), left foot: Secondary | ICD-10-CM

## 2021-08-29 DIAGNOSIS — E119 Type 2 diabetes mellitus without complications: Secondary | ICD-10-CM

## 2021-08-29 DIAGNOSIS — M79675 Pain in left toe(s): Secondary | ICD-10-CM

## 2021-08-29 NOTE — Progress Notes (Signed)
ANNUAL DIABETIC FOOT EXAM  Subjective: Troy [name] presents today  for annual diabetic foot examination and painful elongated mycotic toenails 1-5 bilaterally which are tender when wearing enclosed shoe gear. Pain is relieved with periodic professional debridement..  Patient relates 30 year h/o diabetes.  Patient denies any h/o foot wounds.  Patient endorses numbness, tingling, and burning  in feet.  Patient's blood sugar was 91 mg/dl today. .  Risk factors:  former smoker, diabetes, CKD, hyperlipidemia, HTN, CAD, CHF.  Street, [name], MD is patient's PCP. Last visit was 01/14/2021.  Past Medical History:  Diagnosis Date   Anemia 12/09/2015   CAD in native artery 12/09/2015   Overview:  PCI/ stent 2001   Chronic diastolic heart failure ([place]) 12/09/2015   Coronary artery disease involving native coronary artery of native heart with angina pectoris ([place]) 12/09/2015   Overview:  PCI/ stent 2001   Hyperlipidemia 12/09/2015   Hypertensive heart disease with heart failure ([place]) 12/09/2015   Necrotizing pancreatitis 06/26/2015   Pseudocyst of pancreas 06/26/2015   Patient Active Problem List   Diagnosis Date Noted   Aortic stenosis, mild 01/15/2021   H/O prostate cancer 01/15/2021   Benign hypertension with chronic kidney disease, stage III ([place]) 01/15/2021   Type 2 diabetes mellitus with stage 4 chronic kidney disease, with long-term current use of insulin ([place]) 01/15/2021   Benign hypertension with chronic kidney disease, stage IV ([place]) 01/15/2021   CKD (chronic kidney disease) stage 4, GFR 15-29 ml/min ([place]) 01/15/2021   Lung nodule 11/05/2020   Bradycardia 10/01/2020   Anemia 12/09/2015   Coronary artery disease involving native coronary artery of native heart with angina pectoris ([place]) 12/09/2015   Chronic diastolic heart failure ([place]) 12/09/2015   Hyperlipidemia 12/09/2015   Hypertensive heart disease with heart failure ([place]) 12/09/2015   CAD in native artery  12/09/2015   Necrotizing pancreatitis 06/26/2015   Pseudocyst of pancreas 06/26/2015   Past Surgical History:  Procedure Laterality Date   ABDOMINAL AORTOGRAM W/LOWER EXTREMITY Bilateral 09/10/2018   Procedure: ABDOMINAL AORTOGRAM W/LOWER EXTREMITY;  Surgeon: Elam Dutch, MD;  Location: [place] [facility];  Service: Cardiovascular;  Laterality: Bilateral;   CHOLECYSTECTOMY     PERIPHERAL VASCULAR INTERVENTION Right 09/10/2018   Procedure: PERIPHERAL VASCULAR INTERVENTION;  Surgeon: Elam Dutch, MD;  Location: [place] [facility];  Service: Cardiovascular;  Laterality: Right;  common iliac   TONSILLECTOMY AND ADENOIDECTOMY     TOTAL KNEE REVISION Bilateral    Current Outpatient Medications on File Prior to Visit  Medication Sig Dispense Refill   amLODipine (NORVASC) 5 MG tablet Take 1 tablet (5 mg total) by mouth in the morning and at bedtime. 180 tablet 3   ascorbic acid (VITAMIN C) 500 MG/5ML syrup Take 500 mg by mouth daily.     atorvastatin (LIPITOR) 80 MG tablet TAKE 1 TABLET EACH EVENING 90 tablet 1   B Complex Vitamins (VITAMIN B COMPLEX) TABS Take 1 tablet by mouth daily.     Cholecalciferol (VITAMIN D3) 50 MCG (2000 UT) TABS Take 4,000 Units by mouth daily.     Cinnamon 500 MG capsule Take 500 mg by mouth daily.     cloNIDine (CATAPRES) 0.1 MG tablet Take 1 tablet (0.1 mg total) by mouth 2 (two) times daily. 180 tablet 3   clopidogrel (PLAVIX) 75 MG tablet Take 75 mg by mouth daily.     co-enzyme Q-10 30 MG capsule Take 30 mg by mouth daily.     dapagliflozin propanediol (  FARXIGA) 10 MG TABS tablet Take 10 mg by mouth daily.     doxazosin (CARDURA) 2 MG tablet Take 2 mg by mouth at bedtime.      ferrous sulfate 325 (65 FE) MG EC tablet Take 325 mg by mouth daily with breakfast.     hydrALAZINE (APRESOLINE) 25 MG tablet Take one tablet by mouth daily in the morning and at 12pm. Take two tablets by mouth daily at bedtime. 270 tablet 2   insulin glargine (LANTUS) 100  UNIT/ML injection Inject 20 Units into the skin daily.     Iron Combinations (IRON COMPLEX PO) Take 65 mg by mouth daily.     lisinopril (ZESTRIL) 20 MG tablet Take 20 mg by mouth daily.     Multiple Vitamins-Minerals (EYE VITAMINS PO) Take 1 tablet by mouth daily.     nitroGLYCERIN (NITROSTAT) 0.4 MG SL tablet Place 1 tablet (0.4 mg total) under the tongue every 5 (five) minutes as needed for chest pain. 25 tablet 4   omeprazole (PRILOSEC) 40 MG capsule Take 40 mg by mouth daily.     Pancrelipase, Lip-Prot-Amyl, (CREON) 24000-76000 units CPEP Take 1 capsule by mouth in the morning, at noon, and at bedtime.     Propylene Glycol (SYSTANE BALANCE) 0.6 % SOLN Place 1 drop into both eyes at bedtime.     No current facility-administered medications on file prior to visit.    No Known Allergies Social History   Occupational History   Not on file  Tobacco Use   Smoking status: Former    Types: Cigarettes    Passive exposure: Past   Smokeless tobacco: Never  Vaping Use   Vaping Use: Never used  Substance and Sexual Activity   Alcohol use: No   Drug use: No   Sexual activity: Not on file   Family History  Problem Relation Age of Onset   Heart failure Mother    Diabetes Mother    Heart failure Father    Immunization History  Administered Date(s) Administered   PFIZER(Purple Top)SARS-COV-2 Vaccination 08/12/2019, 09/02/2019     Review of Systems: Negative except as noted in the HPI.   Objective: There were no vitals filed for this visit.  Troy [name] is a pleasant 86 y.o. male in NAD. AAO X 3.  Vascular Examination: CFT <3 seconds b/l LE. Faintly palpable DP pulses b/l LE. Faintly palpable PT pulse(s) b/l LE. Pedal hair absent. No pain with calf compression b/l. Lower extremity skin temperature gradient within normal limits. No edema noted b/l LE. Varicosities present b/l. No cyanosis or clubbing noted b/l LE.  Dermatological Examination: Pedal skin thin, shiny and atrophic  b/l LE. No open wounds b/l LE. No interdigital macerations noted b/l LE. Toenails 1-5 b/l elongated, discolored, dystrophic, thickened, crumbly with subungual debris and tenderness to dorsal palpation. No hyperkeratotic nor porokeratotic lesions present on today's visit.  Musculoskeletal Examination: Muscle strength 5/5 to all lower extremity muscle groups bilaterally. HAV with bunion deformity noted b/l LE.  Footwear Assessment: Does the patient wear appropriate shoes? Yes. Does the patient need inserts/orthotics? Yes.  Neurological Examination: Protective sensation decreased with 10 gram monofilament b/l. Vibratory sensation intact b/l.  Assessment: 1. Pain due to onychomycosis of toenails of both feet   2. Hallux valgus, acquired, bilateral   3. Diabetic peripheral neuropathy associated with type 2 diabetes mellitus ([place])   4. Encounter for diabetic foot exam ([place])      ADA Risk Categorization: High Risk  Patient has one  or more of the following: Loss of protective sensation Absent pedal pulses Severe Foot deformity History of foot ulcer  Plan: -Diabetic foot examination performed today. -Continue foot and shoe inspections daily. Monitor blood glucose per PCP/Endocrinologist's recommendations. -Mycotic toenails 1-5 bilaterally were debrided in length and girth with sterile nail nippers and dremel without incident. -Patient/POA to call should there be question/concern in the interim.  Return in about 3 months (around 11/26/2021).  Marzetta Board, DPM

## 2021-09-04 ENCOUNTER — Encounter: Payer: Self-pay | Admitting: Podiatry

## 2021-09-26 DIAGNOSIS — H353231 Exudative age-related macular degeneration, bilateral, with active choroidal neovascularization: Secondary | ICD-10-CM | POA: Diagnosis not present

## 2021-10-15 ENCOUNTER — Telehealth: Payer: Self-pay

## 2021-10-15 NOTE — Telephone Encounter (Signed)
? ?  Pre-operative Risk Assessment  ?  ?Patient Name: EITAN DOUBLEDAY  ?DOB: 17-Nov-1935 ?MRN: [id number]  ? ? ? ?Request for Surgical Clearance   ? ?Procedure:  Dental Extraction - Amount of Teeth to be Pulled:  1 ? ?Date of Surgery:  Clearance TBD                              ?   ?Surgeon:  Patricia Pesa ?Surgeon's Group or Practice Name:  [facility]  ?Phone number:  [phone] ?Fax number:  [phone] ?  ?Type of Clearance Requested:   ?- Pharmacy:  Hold Clopidogrel (Plavix) Plavix ?  ?Type of Anesthesia:  Not Indicated ?  ?Additional requests/questions:   ? ?Signed, ?Toni Arthurs   ?10/15/2021, 4:42 PM   ?

## 2021-10-16 NOTE — Telephone Encounter (Signed)
? ?  Primary Cardiologist: Shirlee More, MD ? ?Chart reviewed as part of pre-operative protocol coverage. Simple dental extractions are considered low risk procedures per guidelines and generally do not require any specific cardiac clearance. It is also generally accepted that for simple extractions and dental cleanings, there is no need to interrupt blood thinner therapy.  ? ?SBE prophylaxis is not required for the patient. ? ?I will route this recommendation to the requesting party via Epic fax function and remove from pre-op pool. ? ?Please call with questions. ? ?Deberah Pelton, NP ?10/16/2021, 10:53 AM ? ? ? ? ?

## 2021-10-24 ENCOUNTER — Other Ambulatory Visit: Payer: Self-pay | Admitting: Cardiology

## 2021-10-25 DIAGNOSIS — N1832 Chronic kidney disease, stage 3b: Secondary | ICD-10-CM | POA: Diagnosis not present

## 2021-10-25 DIAGNOSIS — E1122 Type 2 diabetes mellitus with diabetic chronic kidney disease: Secondary | ICD-10-CM | POA: Diagnosis not present

## 2021-10-25 DIAGNOSIS — E782 Mixed hyperlipidemia: Secondary | ICD-10-CM | POA: Diagnosis not present

## 2021-11-18 DIAGNOSIS — C61 Malignant neoplasm of prostate: Secondary | ICD-10-CM | POA: Diagnosis not present

## 2021-11-18 DIAGNOSIS — E1122 Type 2 diabetes mellitus with diabetic chronic kidney disease: Secondary | ICD-10-CM | POA: Diagnosis not present

## 2021-11-18 DIAGNOSIS — E782 Mixed hyperlipidemia: Secondary | ICD-10-CM | POA: Diagnosis not present

## 2021-11-18 DIAGNOSIS — Z794 Long term (current) use of insulin: Secondary | ICD-10-CM | POA: Diagnosis not present

## 2021-11-18 DIAGNOSIS — N1832 Chronic kidney disease, stage 3b: Secondary | ICD-10-CM | POA: Diagnosis not present

## 2021-11-21 ENCOUNTER — Encounter: Payer: Self-pay | Admitting: Cardiology

## 2021-11-21 ENCOUNTER — Ambulatory Visit (INDEPENDENT_AMBULATORY_CARE_PROVIDER_SITE_OTHER): Payer: Medicare Other | Admitting: Cardiology

## 2021-11-21 VITALS — BP 160/82 | HR 89 | Ht 71.0 in | Wt 168.2 lb

## 2021-11-21 DIAGNOSIS — I25119 Atherosclerotic heart disease of native coronary artery with unspecified angina pectoris: Secondary | ICD-10-CM

## 2021-11-21 DIAGNOSIS — H353231 Exudative age-related macular degeneration, bilateral, with active choroidal neovascularization: Secondary | ICD-10-CM | POA: Diagnosis not present

## 2021-11-21 DIAGNOSIS — R001 Bradycardia, unspecified: Secondary | ICD-10-CM

## 2021-11-21 DIAGNOSIS — I441 Atrioventricular block, second degree: Secondary | ICD-10-CM

## 2021-11-21 DIAGNOSIS — I350 Nonrheumatic aortic (valve) stenosis: Secondary | ICD-10-CM | POA: Diagnosis not present

## 2021-11-21 DIAGNOSIS — I110 Hypertensive heart disease with heart failure: Secondary | ICD-10-CM

## 2021-11-21 DIAGNOSIS — N184 Chronic kidney disease, stage 4 (severe): Secondary | ICD-10-CM | POA: Diagnosis not present

## 2021-11-21 NOTE — Patient Instructions (Signed)
Medication Instructions:  ?Your physician recommends that you continue on your current medications as directed. Please refer to the Current Medication list given to you today. ? ?*If you need a refill on your cardiac medications before your next appointment, please call your pharmacy* ? ? ?Lab Work: ?None ?If you have labs (blood work) drawn today and your tests are completely normal, you will receive your results only by: ?MyChart Message (if you have MyChart) OR ?A paper copy in the mail ?If you have any lab test that is abnormal or we need to change your treatment, we will call you to review the results. ? ? ?Testing/Procedures: ?None ? ? ?Follow-Up: ?At [facility], you and your health needs are our priority.  As part of our continuing mission to provide you with exceptional heart care, we have created designated Provider Care Teams.  These Care Teams include your primary Cardiologist (physician) and Advanced Practice Providers (APPs -  Physician Assistants and Nurse Practitioners) who all work together to provide you with the care you need, when you need it. ? ?We recommend signing up for the patient portal called "MyChart".  Sign up information is provided on this After Visit Summary.  MyChart is used to connect with patients for Virtual Visits (Telemedicine).  Patients are able to view lab/test results, encounter notes, upcoming appointments, etc.  Non-urgent messages can be sent to your provider as well.   ?To learn more about what you can do with MyChart, go to [email or web address].   ? ?Your next appointment:   ?4 month(s) ? ?The format for your next appointment:   ?In Person ? ?Provider:   ?Shirlee More, MD  ? ? ?Other Instructions ?In the future do not use Lidocaine with gargling. ? ?You should use a FitBit watch and set the low setting to 40 and the high setting to 100 for heart rate. ? ?Important Information About Sugar ? ? ? ? ? ? ?

## 2021-11-21 NOTE — Progress Notes (Signed)
?Cardiology Office Note:   ? ?Date:  11/21/2021  ? ?ID:  Troy [name], DOB 03-04-1936, MRN [id number] ? ?PCP:  Street, [name], MD  ?Cardiologist:  Shirlee More, MD   ? ?Referring MD: Street, [name], *  ? ? ?ASSESSMENT:   ? ?1. Hypertensive heart disease with heart failure ([place])   ?2. Sinus bradycardia on ECG   ?3. Mobitz (type) I (Wenckebach's) atrioventricular block   ?4. CKD (chronic kidney disease) stage 4, GFR 15-29 ml/min (HCC)   ?5. Nonrheumatic aortic valve stenosis   ?6. Coronary artery disease involving native coronary artery of native heart with angina pectoris ([place])   ? ?PLAN:   ? ?In order of problems listed above: ? ?Overall Troy [name] is doing well blood pressure is reasonably controlled with his wife manipulating doses of hydralazine and heart failure is compensated presently is not on a loop diuretic ?With diaphoresis.  And observe for response of SGLT2 inhibitor ?He had an episode of syncope after dental procedure with local anesthesia I asked him to purchase a smart watch to monitor him for bradycardia.  If he has recurrent episodes we will have to revisit the issue of pacemaker ?Stable CKD no longer sees nephrologist ?Stable aortic stenosis and CAD ? ? ?Next appointment: 4 months ? ? ?Medication Adjustments/Labs and Tests Ordered: ?Current medicines are reviewed at length with the patient today.  Concerns regarding medicines are outlined above.  ?No orders of the defined types were placed in this encounter. ? ?No orders of the defined types were placed in this encounter. ? ? ?Chief Complaint  ?Patient presents with  ? Medication Refill  ?  All cardiac meds  ? Follow-up  ? Hypertension  ? Congestive Heart Failure  ? ? ?History of Present Illness:   ? ?Troy [name] is a 86 y.o. male with a hx of hypertensive heart disease with heart failure and stage IV CKD CAD with previous PCI mild aortic stenosis and previous symptomatic bradycardia resolved stopping a beta-blocker.  He is meticulously  supervised by his wife  a retired cardiology nurse from [employer].  He was last seen 08/16/2021. ? ?Compliance with diet, lifestyle and medications: Yes ? ?His wife is present participates in evaluation decision making ?Overall he is doing well his wife is concerned his SGLT2 inhibitor is giving him nocturnal diaphoresis.  With his stage IV CKD ?An episode after going to the dentist with a local anesthetic that he fainted control in the future not to use epinephrine with lidocaine ?1 evening he had a rapid heart rate of 110 bpm took a nitroglycerin and had relief. ?I have asked him to purchase a Fitbit watch for him ? ?Her phone and set low rates of 40 rapid rates of 100 bpm in atrial fibrillation detection ? ?Outside of this he is done well he is not having edema shortness of breath angina.  His blood pressure has been controlled and she manages his medications at home ?He is anemic his last hemoglobin 9.6 creatinine 2.3 she tells me he is not iron deficient no longer sees a nephrologist I asked him to consider seeing hematology as I feel he would benefit from erythropoietin ?Past Medical History:  ?Diagnosis Date  ? Anemia 12/09/2015  ? CAD in native artery 12/09/2015  ? Overview:  PCI/ stent 2001  ? Chronic diastolic heart failure ([place]) 12/09/2015  ? Coronary artery disease involving native coronary artery of native heart with angina pectoris ([place]) 12/09/2015  ? Overview:  PCI/  stent 2001  ? Hyperlipidemia 12/09/2015  ? Hypertensive heart disease with heart failure ([place]) 12/09/2015  ? Necrotizing pancreatitis 06/26/2015  ? Pseudocyst of pancreas 06/26/2015  ? ? ?Past Surgical History:  ?Procedure Laterality Date  ? ABDOMINAL AORTOGRAM W/LOWER EXTREMITY Bilateral 09/10/2018  ? Procedure: ABDOMINAL AORTOGRAM W/LOWER EXTREMITY;  Surgeon: Elam Dutch, MD;  Location: [facility];  Service: Cardiovascular;  Laterality: Bilateral;  ? CHOLECYSTECTOMY    ? PERIPHERAL VASCULAR INTERVENTION Right 09/10/2018  ?  Procedure: PERIPHERAL VASCULAR INTERVENTION;  Surgeon: Elam Dutch, MD;  Location: [place];  Service: Cardiovascular;  Laterality: Right;  common iliac  ? TONSILLECTOMY AND ADENOIDECTOMY    ? TOTAL KNEE REVISION Bilateral   ? ? ?Current Medications: ?Current Meds  ?Medication Sig  ? amLODipine (NORVASC) 5 MG tablet Take 1 tablet (5 mg total) by mouth in the morning and at bedtime.  ? ascorbic acid (VITAMIN C) 500 MG/5ML syrup Take 500 mg by mouth daily.  ? atorvastatin (LIPITOR) 80 MG tablet TAKE 1 TABLET EACH EVENING  ? B Complex Vitamins (VITAMIN B COMPLEX) TABS Take 1 tablet by mouth daily.  ? Cholecalciferol (VITAMIN D3) 50 MCG (2000 UT) TABS Take 4,000 Units by mouth daily.  ? Cinnamon 500 MG capsule Take 500 mg by mouth daily.  ? cloNIDine (CATAPRES) 0.1 MG tablet Take 1 tablet (0.1 mg total) by mouth 2 (two) times daily.  ? clopidogrel (PLAVIX) 75 MG tablet Take 75 mg by mouth daily.  ? co-enzyme Q-10 30 MG capsule Take 30 mg by mouth daily.  ? dapagliflozin propanediol (FARXIGA) 10 MG TABS tablet Take 10 mg by mouth daily.  ? doxazosin (CARDURA) 2 MG tablet Take 2 mg by mouth at bedtime.   ? ferrous sulfate 325 (65 FE) MG EC tablet Take 325 mg by mouth daily with breakfast.  ? hydrALAZINE (APRESOLINE) 25 MG tablet Take one tablet by mouth daily in the morning and at 12pm. Take two tablets by mouth daily at bedtime. (Patient taking differently: Take 25-50 mg by mouth in the morning and at bedtime. Take one tablet by mouth daily in the morning and at 12pm. Take two tablets by mouth daily at bedtime.)  ? insulin glargine (LANTUS) 100 UNIT/ML injection Inject 20 Units into the skin daily.  ? Iron Combinations (IRON COMPLEX PO) Take 65 mg by mouth daily.  ? lisinopril (ZESTRIL) 20 MG tablet TAKE ONE TABLET BY MOUTH every morning (Patient taking differently: Take 20 mg by mouth daily.)  ? Multiple Vitamins-Minerals (EYE VITAMINS PO) Take 1 tablet by mouth daily.  ? nitroGLYCERIN (NITROSTAT) 0.4 MG  SL tablet Place 1 tablet (0.4 mg total) under the tongue every 5 (five) minutes as needed for chest pain.  ? omeprazole (PRILOSEC) 40 MG capsule Take 40 mg by mouth daily.  ? Pancrelipase, Lip-Prot-Amyl, (CREON) 24000-76000 units CPEP Take 1 capsule by mouth in the morning, at noon, and at bedtime.  ? Propylene Glycol (SYSTANE BALANCE) 0.6 % SOLN Place 1 drop into both eyes at bedtime.  ?  ? ?Allergies:   Patient has no known allergies.  ? ?Social History  ? ?Socioeconomic History  ? Marital status: Married  ?  Spouse name: Not on file  ? Number of children: Not on file  ? Years of education: Not on file  ? Highest education level: Not on file  ?Occupational History  ? Not on file  ?Tobacco Use  ? Smoking status: Former  ?  Types: Cigarettes  ?  Passive exposure: Past  ? Smokeless tobacco: Never  ?Vaping Use  ? Vaping Use: Never used  ?Substance and Sexual Activity  ? Alcohol use: No  ? Drug use: No  ? Sexual activity: Not on file  ?Other Topics Concern  ? Not on file  ?Social History Narrative  ? Not on file  ? ?Social Determinants of Health  ? ?Financial Resource Strain: Not on file  ?Food Insecurity: Not on file  ?Transportation Needs: Not on file  ?Physical Activity: Not on file  ?Stress: Not on file  ?Social Connections: Not on file  ?  ? ?Family History: ?The patient's family history includes Diabetes in his mother; Heart failure in his father and mother. ?ROS:   ?Please see the history of present illness.    ?All other systems reviewed and are negative. ? ?EKGs/Labs/Other Studies Reviewed:   ? ?The following studies were reviewed today: ? ? ?Recent Labs: ?No results found for requested labs within last 8760 hours.  ?Recent Lipid Panel ?   ?Component Value Date/Time  ? CHOL 138 10/01/2020 2354  ? TRIG 197 (H) 10/01/2020 2354  ? HDL 32 (L) 10/01/2020 2354  ? CHOLHDL 4.3 10/01/2020 2354  ? VLDL 39 10/01/2020 2354  ? [place] 67 10/01/2020 2354  ? ? ?Physical Exam:   ? ?VS:  BP (!) 160/82 (BP Location: Left Arm,  Patient Position: Sitting)   Pulse 89   Ht '5\' 11"'$  (1.803 m)   Wt 168 lb 3.2 oz (76.3 kg)   SpO2 (!) 89%   BMI 23.46 kg/m?    ? ?Wt Readings from Last 3 Encounters:  ?11/21/21 168 lb 3.2 oz (76.3 kg)  ?0

## 2021-11-24 DIAGNOSIS — N1832 Chronic kidney disease, stage 3b: Secondary | ICD-10-CM | POA: Diagnosis not present

## 2021-11-24 DIAGNOSIS — E1122 Type 2 diabetes mellitus with diabetic chronic kidney disease: Secondary | ICD-10-CM | POA: Diagnosis not present

## 2021-11-24 DIAGNOSIS — E782 Mixed hyperlipidemia: Secondary | ICD-10-CM | POA: Diagnosis not present

## 2021-11-26 DIAGNOSIS — D638 Anemia in other chronic diseases classified elsewhere: Secondary | ICD-10-CM | POA: Diagnosis not present

## 2021-11-26 DIAGNOSIS — E1122 Type 2 diabetes mellitus with diabetic chronic kidney disease: Secondary | ICD-10-CM | POA: Diagnosis not present

## 2021-11-26 DIAGNOSIS — Z794 Long term (current) use of insulin: Secondary | ICD-10-CM | POA: Diagnosis not present

## 2021-11-26 DIAGNOSIS — Z0000 Encounter for general adult medical examination without abnormal findings: Secondary | ICD-10-CM | POA: Diagnosis not present

## 2021-11-26 DIAGNOSIS — N1832 Chronic kidney disease, stage 3b: Secondary | ICD-10-CM | POA: Diagnosis not present

## 2021-11-28 ENCOUNTER — Encounter: Payer: Self-pay | Admitting: Podiatry

## 2021-11-28 ENCOUNTER — Ambulatory Visit (INDEPENDENT_AMBULATORY_CARE_PROVIDER_SITE_OTHER): Payer: Medicare Other | Admitting: Podiatry

## 2021-11-28 DIAGNOSIS — M79675 Pain in left toe(s): Secondary | ICD-10-CM | POA: Diagnosis not present

## 2021-11-28 DIAGNOSIS — B351 Tinea unguium: Secondary | ICD-10-CM | POA: Diagnosis not present

## 2021-11-28 DIAGNOSIS — M79674 Pain in right toe(s): Secondary | ICD-10-CM

## 2021-11-28 DIAGNOSIS — E1142 Type 2 diabetes mellitus with diabetic polyneuropathy: Secondary | ICD-10-CM

## 2021-11-28 NOTE — Progress Notes (Signed)
?  Subjective:  ?Patient ID: Troy [name], male    DOB: 11/19/1935,  MRN: [id number] ? ?Troy [name] presents to clinic today for at risk foot care with history of diabetic neuropathy and painful elongated mycotic toenails 1-5 bilaterally which are tender when wearing enclosed shoe gear. Pain is relieved with periodic professional debridement. ? ?Patient states blood glucose was 115 mg/dl today.  Last known HgA1c was unknown by patient. ? ?New problem(s): None.  ? ?PCP is Street, [name], MD , and last visit was August 27, 2021. ? ?No Known Allergies ? ?Review of Systems: Negative except as noted in the HPI. ? ?Objective: No changes noted in today's physical examination. ? ?There were no vitals filed for this visit. ? ?Troy [name] is a pleasant 86 y.o. male in NAD. AAO X 3. ? ?Vascular Examination: ?CFT <3 seconds b/l LE. Faintly palpable DP pulses b/l LE. Faintly palpable PT pulse(s) b/l LE. Pedal hair absent. No pain with calf compression b/l. Lower extremity skin temperature gradient within normal limits. No edema noted b/l LE. Varicosities present b/l. No cyanosis or clubbing noted b/l LE. ? ?Dermatological Examination: ?Pedal skin thin, shiny and atrophic b/l LE. No open wounds b/l LE. No interdigital macerations noted b/l LE. Toenails 1-5 b/l elongated, discolored, dystrophic, thickened, crumbly with subungual debris and tenderness to dorsal palpation. No hyperkeratotic nor porokeratotic lesions present on today's visit. ? ?Musculoskeletal Examination: ?Muscle strength 5/5 to all lower extremity muscle groups bilaterally. HAV with bunion deformity noted b/l LE. ? ?Neurological Examination: ?Protective sensation decreased with 10 gram monofilament b/l. Vibratory sensation intact b/l. ? ?Assessment/Plan: ?1. Pain due to onychomycosis of toenails of both feet   ?2. Diabetic peripheral neuropathy associated with type 2 diabetes mellitus ([place])   ?-Patient was evaluated and treated. All patient's and/or  POA's questions/concerns answered on today's visit. ?-Patient to continue soft, supportive shoe gear daily. ?-Toenails 1-5 b/l were debrided in length and girth with sterile nail nippers without iatrogenic bleeding. Patient left before medical assistant was able to smooth his toenails. ?-Patient/POA to call should there be question/concern in the interim.  ? ?Return in about 3 months (around 02/28/2022). ? ?Marzetta Board, DPM  ?

## 2021-12-25 DIAGNOSIS — N1832 Chronic kidney disease, stage 3b: Secondary | ICD-10-CM | POA: Diagnosis not present

## 2021-12-25 DIAGNOSIS — E1122 Type 2 diabetes mellitus with diabetic chronic kidney disease: Secondary | ICD-10-CM | POA: Diagnosis not present

## 2021-12-25 DIAGNOSIS — E782 Mixed hyperlipidemia: Secondary | ICD-10-CM | POA: Diagnosis not present

## 2022-01-16 DIAGNOSIS — H353211 Exudative age-related macular degeneration, right eye, with active choroidal neovascularization: Secondary | ICD-10-CM | POA: Diagnosis not present

## 2022-01-16 DIAGNOSIS — H353222 Exudative age-related macular degeneration, left eye, with inactive choroidal neovascularization: Secondary | ICD-10-CM | POA: Diagnosis not present

## 2022-01-23 DIAGNOSIS — H43813 Vitreous degeneration, bilateral: Secondary | ICD-10-CM | POA: Diagnosis not present

## 2022-01-23 DIAGNOSIS — H353222 Exudative age-related macular degeneration, left eye, with inactive choroidal neovascularization: Secondary | ICD-10-CM | POA: Diagnosis not present

## 2022-01-23 DIAGNOSIS — E119 Type 2 diabetes mellitus without complications: Secondary | ICD-10-CM | POA: Diagnosis not present

## 2022-01-23 DIAGNOSIS — H353211 Exudative age-related macular degeneration, right eye, with active choroidal neovascularization: Secondary | ICD-10-CM | POA: Diagnosis not present

## 2022-01-24 DIAGNOSIS — N1832 Chronic kidney disease, stage 3b: Secondary | ICD-10-CM | POA: Diagnosis not present

## 2022-01-24 DIAGNOSIS — E1122 Type 2 diabetes mellitus with diabetic chronic kidney disease: Secondary | ICD-10-CM | POA: Diagnosis not present

## 2022-01-24 DIAGNOSIS — E782 Mixed hyperlipidemia: Secondary | ICD-10-CM | POA: Diagnosis not present

## 2022-01-28 ENCOUNTER — Other Ambulatory Visit: Payer: Self-pay | Admitting: Cardiology

## 2022-02-06 NOTE — Progress Notes (Signed)
Cardiology Office Note:    Date:  02/07/2022   ID:  Troy [name], DOB 01-19-1936, MRN [id number]  PCP:  Street, [name], MD  Cardiologist:  Shirlee More, MD    Referring MD: [address], [name], *    ASSESSMENT:    1. Coronary artery disease involving native coronary artery of native heart with angina pectoris ([place])   2. Mobitz (type) I (Wenckebach's) atrioventricular block   3. CKD (chronic kidney disease) stage 4, GFR 15-29 ml/min (HCC)   4. Nonrheumatic aortic valve stenosis    PLAN:    In order of problems listed above:  Is a very complex situation and I respect their wishes Do everything conceivably possible so he can baptized his great grandchildren and I think of accommodated.  If he is stable over the weekend we will make arrangements Monday for direct admission stabilization renal protection and then planning on coronary angiography.  If he worsens who presented to [facility] and we will plan on bring him to [facility] I will speak to my partner I will check routine labs CBC and renal function today we will go ahead and stop his SGLT2 inhibitor in preparation to contrast continue his medical therapy including clopidogrel not on rate limiting antianginal medication amlodipine continue his statin start tropical nitroglycerin Stable eventually he will likely require pacemaker therapy that we deferred last admission Recheck labs including CBC and a BMP today continue his antihypertensives including amlodipine alpha-blocker Cardura hydralazine lisinopril. He has mild aortic stenosis   Next appointment: To be decided after this week   Medication Adjustments/Labs and Tests Ordered: Current medicines are reviewed at length with the patient today.  Concerns regarding medicines are outlined above.  No orders of the defined types were placed in this encounter.  No orders of the defined types were placed in this encounter.   Chief Complaint  Patient  presents with   Follow-up   Hypertension   Congestive Heart Failure    History of Present Illness:    Troy [name] is a 86 y.o. male with a hx of  hypertensive heart disease with heart failure and stage IV CKD CAD with previous PCI mild aortic stenosis and previous symptomatic bradycardia resolved stopping a beta-blocker last seen 11/21/2021.  My staff alerts me that he is having more frequent chest pain and had to take a total of 4 nitroglycerin for relief.  Compliance with diet, lifestyle and medications: Yes  Is a very complex visit he is really worked in his urgent and although they are able to be working in my office today they did not receive triage as they did not relate the context She is an Therapist, sports previously with the charge nurse on her telemetry For several weeks he has not felt well he is having angina with minor activity even at times getting up to walk to the bathroom 3 weeks ago he had a prolonged severe episode of chest pain last night he required a total of 5 nitroglycerin for angina that waxed and waned but was relieved in all situations They did not go to the emergency room 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 Harriet Masson so that we could transfer him to Troy [name] If he has a quiet weekend we will call him on Monday morning to make plans for proceeding to coronary angiography He takes an SGLT2 inhibitor that will stop preceding consideration of coronary angiography with stage IV CKD His home heart rates are mostly in the 60s in the office today is 38 with 2-1 AV block.  Today it is Wenke block.  I am holding his pulse at 60 bpm He has  chronic bradycardia he has not had syncope takes no rate slowing medications We will optimize his medical therapy by using nitroglycerin topically until Monday Again they have good healthcare knowledge I understand their goals in life the importance of the event on Sunday and we will do everything possible to accommodate. We spoke about drawing a troponin and they declined at this time Past Medical History:  Diagnosis Date   Anemia 12/09/2015   CAD in native artery 12/09/2015   Overview:  PCI/ stent 2001   Chronic diastolic heart failure ([place]) 12/09/2015   Coronary artery disease involving native coronary artery of native heart with angina pectoris ([place]) 12/09/2015   Overview:  PCI/ stent 2001   Hyperlipidemia 12/09/2015   Hypertensive heart disease with heart failure ([place]) 12/09/2015   Necrotizing pancreatitis 06/26/2015   Pseudocyst of pancreas 06/26/2015    Past Surgical History:  Procedure Laterality Date   ABDOMINAL AORTOGRAM W/LOWER EXTREMITY Bilateral 09/10/2018   Procedure: ABDOMINAL AORTOGRAM W/LOWER EXTREMITY;  Surgeon: Elam Dutch, MD;  Location: [facility];  Service: Cardiovascular;  Laterality: Bilateral;   CHOLECYSTECTOMY     PERIPHERAL VASCULAR INTERVENTION Right 09/10/2018   Procedure: PERIPHERAL VASCULAR INTERVENTION;  Surgeon: Elam Dutch, MD;  Location: [facility];  Service: Cardiovascular;  Laterality: Right;  common iliac   TONSILLECTOMY AND ADENOIDECTOMY     TOTAL KNEE REVISION Bilateral     Current Medications: Current Meds  Medication Sig   amLODipine (NORVASC) 5 MG tablet Take 1 tablet (5 mg total) by mouth in the morning and at bedtime.   ascorbic acid (VITAMIN C) 500 MG/5ML syrup Take 500 mg by mouth daily.   atorvastatin (LIPITOR) 80 MG tablet TAKE 1 TABLET EACH EVENING   B Complex Vitamins (VITAMIN B COMPLEX) TABS Take 1 tablet by mouth daily.   Cholecalciferol (VITAMIN D3) 50 MCG (2000 UT) TABS Take 4,000 Units by mouth daily.    Cinnamon 500 MG capsule Take 500 mg by mouth daily.   cloNIDine (CATAPRES) 0.1 MG tablet Take 1 tablet (0.1 mg total) by mouth 2 (two) times daily.   clopidogrel (PLAVIX) 75 MG tablet Take 75 mg by mouth daily.   co-enzyme Q-10 30 MG capsule Take 30 mg by mouth daily.   doxazosin (CARDURA) 2 MG tablet Take 2 mg by mouth at bedtime.    ferrous sulfate 325 (65 FE) MG EC tablet Take 325 mg by mouth daily with breakfast.   hydrALAZINE (APRESOLINE) 25 MG tablet Take one tablet by mouth daily in the morning and at 12pm. Take two tablets by mouth daily at bedtime.   insulin glargine (LANTUS) 100 UNIT/ML injection Inject 20 Units into the skin daily.   Iron Combinations (IRON COMPLEX PO) Take 65 mg by mouth daily.   lisinopril (ZESTRIL) 20 MG tablet TAKE ONE TABLET BY MOUTH every morning   Multiple Vitamins-Minerals (EYE VITAMINS PO) Take 1 tablet by mouth daily.   nitroGLYCERIN (NITROSTAT) 0.4  MG SL tablet Place 1 tablet (0.4 mg total) under the tongue every 5 (five) minutes as needed for chest pain.   omeprazole (PRILOSEC) 40 MG capsule Take 40 mg by mouth daily.   Pancrelipase, Lip-Prot-Amyl, (CREON) 24000-76000 units CPEP Take 1 capsule by mouth in the morning, at noon, and at bedtime.   Propylene Glycol (SYSTANE BALANCE) 0.6 % SOLN Place 1 drop into both eyes at bedtime.   [DISCONTINUED] dapagliflozin propanediol (FARXIGA) 10 MG TABS tablet Take 10 mg by mouth daily.     Allergies:   Patient has no known allergies.   Social History   Socioeconomic History   Marital status: Married    Spouse name: Not on file   Number of children: Not on file   Years of education: Not on file   Highest education level: Not on file  Occupational History   Not on file  Tobacco Use   Smoking status: Former    Types: Cigarettes    Passive exposure: Past   Smokeless tobacco: Never  Vaping Use   Vaping Use: Never used  Substance and Sexual Activity   Alcohol use: No   Drug use: No   Sexual activity: Not  on file  Other Topics Concern   Not on file  Social History Narrative   Not on file   Social Determinants of Health   Financial Resource Strain: Not on file  Food Insecurity: Not on file  Transportation Needs: Not on file  Physical Activity: Not on file  Stress: Not on file  Social Connections: Not on file     Family History: The patient's family history includes Diabetes in his mother; Heart failure in his father and mother. ROS:   Please see the history of present illness.    All other systems reviewed and are negative.  EKGs/Labs/Other Studies Reviewed:    The following studies were reviewed today:  EKG:  EKG ordered today and personally reviewed.  The ekg ordered today demonstrates abnormality rate 38 2-1 AV block Wenke block he has LVH repolarization similar to previous EKG but more impressive than the inferior leads  Recent Labs: No results found for requested labs within last 365 days.  Recent Lipid Panel    Component Value Date/Time   CHOL 138 10/01/2020 2354   TRIG 197 (H) 10/01/2020 2354   HDL 32 (L) 10/01/2020 2354   CHOLHDL 4.3 10/01/2020 2354   VLDL 39 10/01/2020 2354   LDLCALC 67 10/01/2020 2354    Physical Exam:    VS:  BP (!) 152/48   Pulse (!) 38   Ht '5\' 11"'$  (1.803 m)   Wt 168 lb 6.4 oz (76.4 kg)   SpO2 99%   BMI 23.49 kg/m     Wt Readings from Last 3 Encounters:  02/07/22 168 lb 6.4 oz (76.4 kg)  11/21/21 168 lb 3.2 oz (76.3 kg)  08/16/21 172 lb 12.8 oz (78.4 kg)     GEN: Alert well nourished, well developed in no acute distress HEENT: Normal NECK: No JVD; No carotid bruits LYMPHATICS: No lymphadenopathy CARDIAC: 1-2 of 6 AAS murmur RRR, no murmurs, rubs, gallops RESPIRATORY:  Clear to auscultation without rales, wheezing or rhonchi  ABDOMEN: Soft, non-tender, non-distended MUSCULOSKELETAL:  No edema; No deformity  SKIN: Warm and dry NEUROLOGIC:  Alert and oriented x 3 PSYCHIATRIC:  Normal affect    Signed, Shirlee More, MD   02/07/2022 11:36 AM    [place]

## 2022-02-07 ENCOUNTER — Encounter: Payer: Self-pay | Admitting: Cardiology

## 2022-02-07 ENCOUNTER — Ambulatory Visit (INDEPENDENT_AMBULATORY_CARE_PROVIDER_SITE_OTHER): Payer: Medicare Other | Admitting: Cardiology

## 2022-02-07 VITALS — BP 152/48 | HR 38 | Ht 71.0 in | Wt 168.4 lb

## 2022-02-07 DIAGNOSIS — N184 Chronic kidney disease, stage 4 (severe): Secondary | ICD-10-CM | POA: Diagnosis not present

## 2022-02-07 DIAGNOSIS — I350 Nonrheumatic aortic (valve) stenosis: Secondary | ICD-10-CM

## 2022-02-07 DIAGNOSIS — I25119 Atherosclerotic heart disease of native coronary artery with unspecified angina pectoris: Secondary | ICD-10-CM

## 2022-02-07 DIAGNOSIS — I441 Atrioventricular block, second degree: Secondary | ICD-10-CM

## 2022-02-07 MED ORDER — NITRO-BID 2 % TD OINT: 1.0000 [in_us] | TOPICAL_OINTMENT | Freq: Four times a day (QID) | TRANSDERMAL | 0 refills | Status: DC

## 2022-02-07 NOTE — Patient Instructions (Signed)
Medication Instructions:  Your physician has recommended you make the following change in your medication:   START: Nitropaste 1 inch to chest wall every 6 hours STOP: Farxiga  *If you need a refill on your cardiac medications before your next appointment, please call your pharmacy*   Lab Work: Your physician recommends that you return for lab work in:   Labs today: CBC, BMP  If you have labs (blood work) drawn today and your tests are completely normal, you will receive your results only by: [place] (if you have [place]) OR A paper copy in the mail If you have any lab test that is abnormal or we need to change your treatment, we will call you to review the results.   Testing/Procedures: None   Follow-Up: At [facility], you and your health needs are our priority.  As part of our continuing mission to provide you with exceptional heart care, we have created designated Provider Care Teams.  These Care Teams include your primary Cardiologist (physician) and Advanced Practice Providers (APPs -  Physician Assistants and Nurse Practitioners) who all work together to provide you with the care you need, when you need it.  We recommend signing up for the patient portal called "MyChart".  Sign up information is provided on this After Visit Summary.  MyChart is used to connect with patients for Virtual Visits (Telemedicine).  Patients are able to view lab/test results, encounter notes, upcoming appointments, etc.  Non-urgent messages can be sent to your provider as well.   To learn more about what you can do with MyChart, go to [email or web address].    Your next appointment:   Follow up to be determined  The format for your next appointment:   In Person  Provider:   Shirlee More, MD    Other Instructions None  Important Information About Sugar

## 2022-02-08 LAB — BASIC METABOLIC PANEL
BUN/Creatinine Ratio: 16 (ref 10–24)
BUN: 36 mg/dL — ABNORMAL HIGH (ref 8–27)
CO2: 20 mmol/L (ref 20–29)
Calcium: 9.9 mg/dL (ref 8.6–10.2)
Chloride: 97 mmol/L (ref 96–106)
Creatinine, Ser: 2.23 mg/dL — ABNORMAL HIGH (ref 0.76–1.27)
Glucose: 130 mg/dL — ABNORMAL HIGH (ref 70–99)
Potassium: 5.5 mmol/L — ABNORMAL HIGH (ref 3.5–5.2)
Sodium: 129 mmol/L — ABNORMAL LOW (ref 134–144)
eGFR: 29 mL/min/{1.73_m2} — ABNORMAL LOW (ref >59)

## 2022-02-08 LAB — CBC
Hematocrit: 29.1 % — ABNORMAL LOW (ref 37.5–51.0)
Hemoglobin: 9.6 g/dL — ABNORMAL LOW (ref 13.0–17.7)
MCH: 28.7 pg (ref 26.6–33.0)
MCHC: 33 g/dL (ref 31.5–35.7)
MCV: 87 fL (ref 79–97)
Platelets: 262 10*3/uL (ref 150–450)
RBC: 3.35 x10E6/uL — ABNORMAL LOW (ref 4.14–5.80)
RDW: 14 % (ref 11.6–15.4)
WBC: 8.7 10*3/uL (ref 3.4–10.8)

## 2022-02-10 ENCOUNTER — Telehealth: Payer: Self-pay

## 2022-02-10 ENCOUNTER — Inpatient Hospital Stay (HOSPITAL_COMMUNITY)
Admission: EM | Admit: 2022-02-10 | Discharge: 2022-02-15 | DRG: 246 | Disposition: A | Discharge status: Home / Self Care | Payer: Medicare Other | Attending: Internal Medicine | Admitting: Internal Medicine

## 2022-02-10 ENCOUNTER — Encounter (HOSPITAL_COMMUNITY): Payer: Self-pay | Admitting: Emergency Medicine

## 2022-02-10 ENCOUNTER — Other Ambulatory Visit: Payer: Self-pay

## 2022-02-10 ENCOUNTER — Observation Stay (HOSPITAL_COMMUNITY): Payer: Medicare Other

## 2022-02-10 ENCOUNTER — Emergency Department (HOSPITAL_COMMUNITY): Payer: Medicare Other

## 2022-02-10 DIAGNOSIS — Z96653 Presence of artificial knee joint, bilateral: Secondary | ICD-10-CM | POA: Diagnosis present

## 2022-02-10 DIAGNOSIS — R001 Bradycardia, unspecified: Secondary | ICD-10-CM | POA: Diagnosis present

## 2022-02-10 DIAGNOSIS — I214 Non-ST elevation (NSTEMI) myocardial infarction: Secondary | ICD-10-CM | POA: Diagnosis present

## 2022-02-10 DIAGNOSIS — I350 Nonrheumatic aortic (valve) stenosis: Secondary | ICD-10-CM

## 2022-02-10 DIAGNOSIS — I2510 Atherosclerotic heart disease of native coronary artery without angina pectoris: Secondary | ICD-10-CM | POA: Diagnosis not present

## 2022-02-10 DIAGNOSIS — E785 Hyperlipidemia, unspecified: Secondary | ICD-10-CM | POA: Diagnosis not present

## 2022-02-10 DIAGNOSIS — N179 Acute kidney failure, unspecified: Secondary | ICD-10-CM

## 2022-02-10 DIAGNOSIS — I5032 Chronic diastolic (congestive) heart failure: Secondary | ICD-10-CM | POA: Diagnosis present

## 2022-02-10 DIAGNOSIS — I517 Cardiomegaly: Secondary | ICD-10-CM

## 2022-02-10 DIAGNOSIS — I25119 Atherosclerotic heart disease of native coronary artery with unspecified angina pectoris: Secondary | ICD-10-CM

## 2022-02-10 DIAGNOSIS — Z955 Presence of coronary angioplasty implant and graft: Secondary | ICD-10-CM

## 2022-02-10 DIAGNOSIS — K8591 Acute pancreatitis with uninfected necrosis, unspecified: Secondary | ICD-10-CM | POA: Diagnosis not present

## 2022-02-10 DIAGNOSIS — E860 Dehydration: Secondary | ICD-10-CM | POA: Diagnosis present

## 2022-02-10 DIAGNOSIS — I441 Atrioventricular block, second degree: Secondary | ICD-10-CM | POA: Diagnosis not present

## 2022-02-10 DIAGNOSIS — I5020 Unspecified systolic (congestive) heart failure: Secondary | ICD-10-CM | POA: Diagnosis not present

## 2022-02-10 DIAGNOSIS — K861 Other chronic pancreatitis: Secondary | ICD-10-CM | POA: Diagnosis present

## 2022-02-10 DIAGNOSIS — E1151 Type 2 diabetes mellitus with diabetic peripheral angiopathy without gangrene: Secondary | ICD-10-CM | POA: Diagnosis present

## 2022-02-10 DIAGNOSIS — I130 Hypertensive heart and chronic kidney disease with heart failure and stage 1 through stage 4 chronic kidney disease, or unspecified chronic kidney disease: Secondary | ICD-10-CM | POA: Diagnosis present

## 2022-02-10 DIAGNOSIS — I129 Hypertensive chronic kidney disease with stage 1 through stage 4 chronic kidney disease, or unspecified chronic kidney disease: Secondary | ICD-10-CM | POA: Diagnosis not present

## 2022-02-10 DIAGNOSIS — Z833 Family history of diabetes mellitus: Secondary | ICD-10-CM

## 2022-02-10 DIAGNOSIS — E1169 Type 2 diabetes mellitus with other specified complication: Secondary | ICD-10-CM | POA: Diagnosis not present

## 2022-02-10 DIAGNOSIS — Z79899 Other long term (current) drug therapy: Secondary | ICD-10-CM | POA: Diagnosis not present

## 2022-02-10 DIAGNOSIS — D631 Anemia in chronic kidney disease: Secondary | ICD-10-CM | POA: Diagnosis not present

## 2022-02-10 DIAGNOSIS — E7800 Pure hypercholesterolemia, unspecified: Secondary | ICD-10-CM

## 2022-02-10 DIAGNOSIS — I083 Combined rheumatic disorders of mitral, aortic and tricuspid valves: Secondary | ICD-10-CM | POA: Diagnosis not present

## 2022-02-10 DIAGNOSIS — D649 Anemia, unspecified: Secondary | ICD-10-CM | POA: Diagnosis present

## 2022-02-10 DIAGNOSIS — Z8249 Family history of ischemic heart disease and other diseases of the circulatory system: Secondary | ICD-10-CM | POA: Diagnosis not present

## 2022-02-10 DIAGNOSIS — R079 Chest pain, unspecified: Principal | ICD-10-CM

## 2022-02-10 DIAGNOSIS — I452 Bifascicular block: Secondary | ICD-10-CM | POA: Diagnosis not present

## 2022-02-10 DIAGNOSIS — Z794 Long term (current) use of insulin: Secondary | ICD-10-CM

## 2022-02-10 DIAGNOSIS — I442 Atrioventricular block, complete: Secondary | ICD-10-CM | POA: Diagnosis present

## 2022-02-10 DIAGNOSIS — Z87891 Personal history of nicotine dependence: Secondary | ICD-10-CM | POA: Diagnosis not present

## 2022-02-10 DIAGNOSIS — E1122 Type 2 diabetes mellitus with diabetic chronic kidney disease: Secondary | ICD-10-CM | POA: Diagnosis not present

## 2022-02-10 DIAGNOSIS — Y831 Surgical operation with implant of artificial internal device as the cause of abnormal reaction of the patient, or of later complication, without mention of misadventure at the time of the procedure: Secondary | ICD-10-CM | POA: Diagnosis present

## 2022-02-10 DIAGNOSIS — T447X5A Adverse effect of beta-adrenoreceptor antagonists, initial encounter: Secondary | ICD-10-CM | POA: Diagnosis present

## 2022-02-10 DIAGNOSIS — Z7902 Long term (current) use of antithrombotics/antiplatelets: Secondary | ICD-10-CM

## 2022-02-10 DIAGNOSIS — R0789 Other chest pain: Secondary | ICD-10-CM | POA: Diagnosis not present

## 2022-02-10 DIAGNOSIS — I951 Orthostatic hypotension: Secondary | ICD-10-CM | POA: Diagnosis present

## 2022-02-10 DIAGNOSIS — N1832 Chronic kidney disease, stage 3b: Secondary | ICD-10-CM | POA: Diagnosis not present

## 2022-02-10 DIAGNOSIS — N184 Chronic kidney disease, stage 4 (severe): Secondary | ICD-10-CM | POA: Diagnosis present

## 2022-02-10 DIAGNOSIS — I25110 Atherosclerotic heart disease of native coronary artery with unstable angina pectoris: Secondary | ICD-10-CM | POA: Diagnosis present

## 2022-02-10 DIAGNOSIS — R7401 Elevation of levels of liver transaminase levels: Secondary | ICD-10-CM | POA: Diagnosis present

## 2022-02-10 DIAGNOSIS — T82855A Stenosis of coronary artery stent, initial encounter: Secondary | ICD-10-CM | POA: Diagnosis not present

## 2022-02-10 DIAGNOSIS — E871 Hypo-osmolality and hyponatremia: Secondary | ICD-10-CM | POA: Diagnosis not present

## 2022-02-10 HISTORY — DX: Non-ST elevation (NSTEMI) myocardial infarction: I21.4

## 2022-02-10 HISTORY — DX: Cardiomegaly: I51.7

## 2022-02-10 LAB — ECHOCARDIOGRAM COMPLETE
AR max vel: 1.1 cm2
AV Area VTI: 0.99 cm2
AV Area mean vel: 0.88 cm2
AV Mean grad: 23.3 mmHg
AV Peak grad: 40.6 mmHg
Ao pk vel: 3.19 m/s
Area-P 1/2: 5.13 cm2
Calc EF: 60.6 %
Height: 71 in
MV M vel: 5.94 m/s
MV Peak grad: 141.1 mmHg
Radius: 0.4 cm
S' Lateral: 3.4 cm
Single Plane A2C EF: 61.7 %
Single Plane A4C EF: 57.9 %
Weight: 2694.9 oz

## 2022-02-10 LAB — IRON AND TIBC
Iron: 45 ug/dL (ref 45–182)
Saturation Ratios: 17 % — ABNORMAL LOW (ref 17.9–39.5)
TIBC: 267 ug/dL (ref 250–450)
UIBC: 222 ug/dL

## 2022-02-10 LAB — GLUCOSE, CAPILLARY
Glucose-Capillary: 94 mg/dL (ref 70–99)
Glucose-Capillary: 184 mg/dL — ABNORMAL HIGH (ref 70–99)

## 2022-02-10 LAB — CBC
HCT: 28.4 % — ABNORMAL LOW (ref 39.0–52.0)
Hemoglobin: 9.3 g/dL — ABNORMAL LOW (ref 13.0–17.0)
MCH: 28.4 pg (ref 26.0–34.0)
MCHC: 32.7 g/dL (ref 30.0–36.0)
MCV: 86.9 fL (ref 80.0–100.0)
Platelets: 256 10*3/uL (ref 150–400)
RBC: 3.27 MIL/uL — ABNORMAL LOW (ref 4.22–5.81)
RDW: 14.5 % (ref 11.5–15.5)
WBC: 7.2 10*3/uL (ref 4.0–10.5)
nRBC: 0 % (ref 0.0–0.2)

## 2022-02-10 LAB — BASIC METABOLIC PANEL
Anion gap: 11 (ref 5–15)
BUN: 39 mg/dL — ABNORMAL HIGH (ref 8–23)
CO2: 19 mmol/L — ABNORMAL LOW (ref 22–32)
Calcium: 9 mg/dL (ref 8.9–10.3)
Chloride: 98 mmol/L (ref 98–111)
Creatinine, Ser: 2.54 mg/dL — ABNORMAL HIGH (ref 0.61–1.24)
GFR, Estimated: 24 mL/min — ABNORMAL LOW (ref >60)
Glucose, Bld: 314 mg/dL — ABNORMAL HIGH (ref 70–99)
Potassium: 5 mmol/L (ref 3.5–5.1)
Sodium: 128 mmol/L — ABNORMAL LOW (ref 135–145)

## 2022-02-10 LAB — MAGNESIUM: Magnesium: 2.1 mg/dL (ref 1.7–2.4)

## 2022-02-10 LAB — HEMOGLOBIN A1C
Hgb A1c MFr Bld: 6.8 % — ABNORMAL HIGH (ref 4.8–5.6)
Mean Plasma Glucose: 148.46 mg/dL

## 2022-02-10 LAB — LIPID PANEL
Cholesterol: 102 mg/dL (ref 0–200)
HDL: 39 mg/dL — ABNORMAL LOW (ref >40)
LDL Cholesterol: 59 mg/dL (ref 0–99)
Total CHOL/HDL Ratio: 2.6 RATIO
Triglycerides: 21 mg/dL (ref <150)
VLDL: 4 mg/dL (ref 0–40)

## 2022-02-10 LAB — FOLATE: Folate: 32.6 ng/mL (ref >5.9)

## 2022-02-10 LAB — BRAIN NATRIURETIC PEPTIDE: B Natriuretic Peptide: 845.7 pg/mL — ABNORMAL HIGH (ref 0.0–100.0)

## 2022-02-10 LAB — TROPONIN I (HIGH SENSITIVITY)
Troponin I (High Sensitivity): 390 ng/L (ref <18)
Troponin I (High Sensitivity): 549 ng/L (ref <18)
Troponin I (High Sensitivity): 2109 ng/L (ref <18)
Troponin I (High Sensitivity): 2800 ng/L (ref <18)
Troponin I (High Sensitivity): 2703 ng/L (ref <18)

## 2022-02-10 LAB — RETICULOCYTES
Immature Retic Fract: 3.6 % (ref 2.3–15.9)
RBC.: 3.21 MIL/uL — ABNORMAL LOW (ref 4.22–5.81)
Retic Count, Absolute: 31.8 10*3/uL (ref 19.0–186.0)
Retic Ct Pct: 1 % (ref 0.4–3.1)

## 2022-02-10 LAB — HEPARIN LEVEL (UNFRACTIONATED): Heparin Unfractionated: 0.45 IU/mL (ref 0.30–0.70)

## 2022-02-10 LAB — FERRITIN: Ferritin: 101 ng/mL (ref 24–336)

## 2022-02-10 LAB — VITAMIN B12: Vitamin B-12: 1039 pg/mL — ABNORMAL HIGH (ref 180–914)

## 2022-02-10 MED ORDER — INSULIN ASPART 100 UNIT/ML IJ SOLN
0.0000 [IU] | INTRAMUSCULAR | Status: DC
  Administered 2022-02-10: 2 [IU] via SUBCUTANEOUS
  Administered 2022-02-11: 1 [IU] via SUBCUTANEOUS
  Administered 2022-02-11: 2 [IU] via SUBCUTANEOUS
  Administered 2022-02-11: 1 [IU] via SUBCUTANEOUS
  Administered 2022-02-12 – 2022-02-13 (×2): 2 [IU] via SUBCUTANEOUS
  Administered 2022-02-13 (×2): 3 [IU] via SUBCUTANEOUS
  Administered 2022-02-14: 1 [IU] via SUBCUTANEOUS
  Administered 2022-02-14: 7 [IU] via SUBCUTANEOUS
  Administered 2022-02-15: 2 [IU] via SUBCUTANEOUS
  Administered 2022-02-15: 1 [IU] via SUBCUTANEOUS

## 2022-02-10 MED ORDER — NITROGLYCERIN IN D5W 200-5 MCG/ML-% IV SOLN
0.0000 ug/min | INTRAVENOUS | Status: DC
  Administered 2022-02-10: 5 ug/min via INTRAVENOUS
  Filled 2022-02-10: qty 250

## 2022-02-10 MED ORDER — HEPARIN BOLUS VIA INFUSION
4000.0000 [IU] | Freq: Once | INTRAVENOUS | Status: AC
  Administered 2022-02-10: 4000 [IU] via INTRAVENOUS
  Filled 2022-02-10: qty 4000

## 2022-02-10 MED ORDER — SODIUM CHLORIDE 0.9 % IV SOLN: 250.0000 mL | INTRAVENOUS | Status: DC | PRN

## 2022-02-10 MED ORDER — SODIUM CHLORIDE 0.9% FLUSH
3.0000 mL | Freq: Two times a day (BID) | INTRAVENOUS | Status: DC
  Administered 2022-02-10 – 2022-02-12 (×3): 3 mL via INTRAVENOUS

## 2022-02-10 MED ORDER — NITROGLYCERIN 0.4 MG SL SUBL
0.4000 mg | SUBLINGUAL_TABLET | SUBLINGUAL | Status: DC | PRN
  Administered 2022-02-11: 0.4 mg via SUBLINGUAL
  Filled 2022-02-10: qty 1

## 2022-02-10 MED ORDER — SODIUM CHLORIDE 0.9% FLUSH: 3.0000 mL | INTRAVENOUS | Status: DC | PRN

## 2022-02-10 MED ORDER — ACETAMINOPHEN 325 MG PO TABS
650.0000 mg | ORAL_TABLET | ORAL | Status: DC | PRN
  Administered 2022-02-15: 325 mg via ORAL
  Filled 2022-02-10: qty 2

## 2022-02-10 MED ORDER — PANCRELIPASE (LIP-PROT-AMYL) 12000-38000 UNITS PO CPEP
24000.0000 [IU] | ORAL_CAPSULE | Freq: Three times a day (TID) | ORAL | Status: DC
  Administered 2022-02-11 – 2022-02-15 (×9): 24000 [IU] via ORAL
  Filled 2022-02-10 (×11): qty 2

## 2022-02-10 MED ORDER — NITROGLYCERIN 2 % TD OINT
1.0000 [in_us] | TOPICAL_OINTMENT | Freq: Four times a day (QID) | TRANSDERMAL | Status: DC
Start: 2022-02-10 — End: 2022-02-10
  Filled 2022-02-10: qty 1

## 2022-02-10 MED ORDER — CLOPIDOGREL BISULFATE 75 MG PO TABS
75.0000 mg | ORAL_TABLET | Freq: Every day | ORAL | Status: DC
  Administered 2022-02-11 – 2022-02-13 (×3): 75 mg via ORAL
  Filled 2022-02-10 (×3): qty 1

## 2022-02-10 MED ORDER — HEPARIN (PORCINE) 25000 UT/250ML-% IV SOLN
1100.0000 [IU]/h | INTRAVENOUS | Status: DC
Start: 2022-02-10 — End: 2022-02-12
  Administered 2022-02-10 – 2022-02-11 (×2): 900 [IU]/h via INTRAVENOUS
  Administered 2022-02-12: 1100 [IU]/h via INTRAVENOUS
  Filled 2022-02-10 (×3): qty 250

## 2022-02-10 MED ORDER — ASPIRIN 325 MG PO TBEC
325.0000 mg | DELAYED_RELEASE_TABLET | Freq: Once | ORAL | Status: AC
  Administered 2022-02-10: 325 mg via ORAL
  Filled 2022-02-10: qty 1

## 2022-02-10 MED ORDER — CLONIDINE HCL 0.1 MG PO TABS
0.1000 mg | ORAL_TABLET | Freq: Two times a day (BID) | ORAL | Status: DC
  Administered 2022-02-10 – 2022-02-11 (×2): 0.1 mg via ORAL
  Filled 2022-02-10 (×2): qty 1

## 2022-02-10 MED ORDER — ONDANSETRON HCL 4 MG/2ML IJ SOLN: 4.0000 mg | Freq: Four times a day (QID) | INTRAMUSCULAR | Status: DC | PRN

## 2022-02-10 NOTE — Progress Notes (Signed)
ANTICOAGULATION CONSULT NOTE  Pharmacy Consult for Heparin infusion Indication: chest pain/ACS  No Known Allergies  Patient Measurements: Height: '5\' 9"'$  (175.3 cm) Weight: 64.2 kg (141 lb 8.6 oz) IBW/kg (Calculated) : 70.7 Heparin Dosing Weight: 76.4 kg  Vital Signs: Temp: 97.6 F (36.4 C) (07/17 2021) Temp Source: Oral (07/17 2021) BP: 180/51 (07/17 2021) Pulse Rate: 75 (07/17 2021)  Labs: Recent Labs    02/10/22 0943 02/10/22 1132 02/10/22 1607 02/10/22 1745 02/10/22 2039  HGB 9.3*  --   --   --   --   HCT 28.4*  --   --   --   --   PLT 256  --   --   --   --   HEPARINUNFRC  --   --   --   --  0.45  CREATININE 2.54*  --   --   --   --   TROPONINIHS 390*   < > 2,109* 2,800* 2,703*   < > = values in this interval not displayed.     Estimated Creatinine Clearance: 20 mL/min (A) (by C-G formula based on SCr of 2.54 mg/dL (H)).   Assessment: 86 yo M admitted for chest pain x3 wks. Pain is off/on and worsens with exertion. Endorses SOB and dizziness. Patient was not taking any oral anticoagulants prior to admission. Pharmacy consulted to dose heparin infusion for chest pain/ACS protocol.  Heparin therapeutic (0.45) on infusion at 900 units/hr. No bleeding noted. May go to cath lab 7/18.  Goal of Therapy:  Heparin level 0.3-0.7 units/ml Monitor platelets by anticoagulation protocol: Yes   Plan:  Continue heparin at 900 units/hr Monitor daily CBC, heparin level, and s/sx of infection  Sherlon Handing, PharmD, BCPS Please see amion for complete clinical pharmacist phone list 02/10/2022,9:54 PM

## 2022-02-10 NOTE — ED Provider Notes (Signed)
[facility] EMERGENCY DEPARTMENT Provider Note  CSN: [id number] Arrival date & time: 02/10/22 3419  Chief Complaint(s) Chest Pain  HPI Troy [name] is a 86 y.o. male with PMH CHF, HTN, CKD 4, CAD with previous PCI and aortic stenosis who presents emergency department for evaluation of chest pain.  Patient was recently seen on 02/07/2022 by his primary cardiologist Dr. Bettina Gavia who is concerned that the patient has 2-1 Wenckebach AV block with possible need for pacemaker.  In regards to his chest pain, they were hoping to optimize his kidney function by stopping his [place] and the patient was ultimately discharged home with Nitropaste.  Patient's pain worsened over the weekend and he spoke with the triage helpline from his cardiologist office who informed him he will need to come to the emergency department for admission and likely catheterization.  On arrival, patient does have intermittent bradycardia but is currently chest pain-free with a nitro paste in place.  Denies abdominal pain, nausea, vomiting, headache, fever or other systemic symptoms.   Past Medical History Past Medical History:  Diagnosis Date   Anemia 12/09/2015   CAD in native artery 12/09/2015   Overview:  PCI/ stent 2001   Chronic diastolic heart failure ([place]) 12/09/2015   Coronary artery disease involving native coronary artery of native heart with angina pectoris ([place]) 12/09/2015   Overview:  PCI/ stent 2001   Hyperlipidemia 12/09/2015   Hypertensive heart disease with heart failure ([place]) 12/09/2015   Necrotizing pancreatitis 06/26/2015   Pseudocyst of pancreas 06/26/2015   Patient Active Problem List   Diagnosis Date Noted   Aortic stenosis, mild 01/15/2021   H/O prostate cancer 01/15/2021   Benign hypertension with chronic kidney disease, stage III ([place]) 01/15/2021   Type 2 diabetes mellitus with stage 4 chronic kidney disease, with long-term current use of insulin ([place]) 01/15/2021   Benign  hypertension with chronic kidney disease, stage IV ([place]) 01/15/2021   CKD (chronic kidney disease) stage 4, GFR 15-29 ml/min ([place]) 01/15/2021   Lung nodule 11/05/2020   Bradycardia 10/01/2020   Anemia 12/09/2015   Coronary artery disease involving native coronary artery of native heart with angina pectoris ([place]) 12/09/2015   Chronic diastolic heart failure ([place]) 12/09/2015   Hyperlipidemia 12/09/2015   Hypertensive heart disease with heart failure ([place]) 12/09/2015   CAD in native artery 12/09/2015   Necrotizing pancreatitis 06/26/2015   Pseudocyst of pancreas 06/26/2015   Home Medication(s) Prior to Admission medications   Medication Sig Start Date End Date Taking? Authorizing Provider  amLODipine (NORVASC) 5 MG tablet Take 1 tablet (5 mg total) by mouth in the morning and at bedtime. 01/29/22   Richardo Priest, MD  ascorbic acid (VITAMIN C) 500 MG/5ML syrup Take 500 mg by mouth daily.    [provider]  atorvastatin (LIPITOR) 80 MG tablet TAKE 1 TABLET EACH EVENING 08/29/19   Richardo Priest, MD  B Complex Vitamins (VITAMIN B COMPLEX) TABS Take 1 tablet by mouth daily.    [provider]  Cholecalciferol (VITAMIN D3) 50 MCG (2000 UT) TABS Take 4,000 Units by mouth daily.    [provider]  Cinnamon 500 MG capsule Take 500 mg by mouth daily.    [provider]  cloNIDine (CATAPRES) 0.1 MG tablet Take 1 tablet (0.1 mg total) by mouth 2 (two) times daily. 01/30/21   Richardo Priest, MD  clopidogrel (PLAVIX) 75 MG tablet Take 75 mg by mouth daily.    [provider]  co-enzyme Q-10 30 MG capsule Take 30 mg by mouth daily.    [provider]  doxazosin (CARDURA) 2 MG tablet Take 2 mg by mouth at bedtime.     [provider]  ferrous sulfate 325 (65 FE) MG EC tablet Take 325 mg by mouth daily with breakfast.    [provider]  hydrALAZINE (APRESOLINE) 25 MG tablet Take one tablet by mouth daily in the morning and at 12pm. Take  two tablets by mouth daily at bedtime. 08/16/21   Richardo Priest, MD  insulin glargine (LANTUS) 100 UNIT/ML injection Inject 20 Units into the skin daily.    [provider]  Iron Combinations (IRON COMPLEX PO) Take 65 mg by mouth daily.    [provider]  lisinopril (ZESTRIL) 20 MG tablet TAKE ONE TABLET BY MOUTH every morning 10/24/21   Richardo Priest, MD  Multiple Vitamins-Minerals (EYE VITAMINS PO) Take 1 tablet by mouth daily.    [provider]  nitroGLYCERIN (NITRO-BID) 2 % ointment Apply 1 inch topically 4 (four) times daily. 02/07/22   Richardo Priest, MD  nitroGLYCERIN (NITROSTAT) 0.4 MG SL tablet Place 1 tablet (0.4 mg total) under the tongue every 5 (five) minutes as needed for chest pain. 05/16/21   Richardo Priest, MD  omeprazole (PRILOSEC) 40 MG capsule Take 40 mg by mouth daily. 09/02/19   [provider]  Pancrelipase, Lip-Prot-Amyl, (CREON) 24000-76000 units CPEP Take 1 capsule by mouth in the morning, at noon, and at bedtime. 12/16/19   [provider]  Propylene Glycol (SYSTANE BALANCE) 0.6 % SOLN Place 1 drop into both eyes at bedtime.    [provider]                                                                                                                                    Past Surgical History Past Surgical History:  Procedure Laterality Date   ABDOMINAL AORTOGRAM W/LOWER EXTREMITY Bilateral 09/10/2018   Procedure: ABDOMINAL AORTOGRAM W/LOWER EXTREMITY;  Surgeon: Elam Dutch, MD;  Location: [facility];  Service: Cardiovascular;  Laterality: Bilateral;   CHOLECYSTECTOMY     PERIPHERAL VASCULAR INTERVENTION Right 09/10/2018   Procedure: PERIPHERAL VASCULAR INTERVENTION;  Surgeon: Elam Dutch, MD;  Location: [place];  Service: Cardiovascular;  Laterality: Right;  common iliac   TONSILLECTOMY AND ADENOIDECTOMY     TOTAL KNEE REVISION Bilateral    Family History Family History  Problem  Relation Age of Onset   Heart failure Mother    Diabetes Mother    Heart failure Father     Social History Social History   Tobacco Use   Smoking status: Former    Types: Cigarettes    Passive exposure: Past   Smokeless tobacco: Never  Vaping Use   Vaping Use: Never used  Substance Use Topics   Alcohol use: No   Drug use:  No   Allergies Patient has no known allergies.  Review of Systems Review of Systems  Cardiovascular:  Positive for chest pain.    Physical Exam Vital Signs  I have reviewed the triage vital signs BP (!) 155/99 (BP Location: Right Arm)   Pulse 76   Temp 97.9 F (36.6 C) (Oral)   Resp 15   SpO2 100%   Physical Exam Constitutional:      General: He is not in acute distress.    Appearance: Normal appearance.  HENT:     Head: Normocephalic and atraumatic.     Nose: No congestion or rhinorrhea.  Eyes:     General:        Right eye: No discharge.        Left eye: No discharge.     Extraocular Movements: Extraocular movements intact.     Pupils: Pupils are equal, round, and reactive to light.  Cardiovascular:     Rate and Rhythm: Regular rhythm. Bradycardia present.     Heart sounds: Murmur heard.  Pulmonary:     Effort: No respiratory distress.     Breath sounds: No wheezing or rales.  Abdominal:     General: There is no distension.     Tenderness: There is no abdominal tenderness.  Musculoskeletal:        General: Normal range of motion.     Cervical back: Normal range of motion.  Skin:    General: Skin is warm and dry.  Neurological:     General: No focal deficit present.     Mental Status: He is alert.     ED Results and Treatments Labs (all labs ordered are listed, but only abnormal results are displayed) Labs Reviewed  BASIC METABOLIC PANEL - Abnormal; Notable for the following components:      Result Value   Sodium 128 (*)    CO2 19 (*)    Glucose, Bld 314 (*)    BUN 39 (*)    Creatinine, Ser 2.54 (*)    GFR, Estimated  24 (*)    All other components within normal limits  CBC - Abnormal; Notable for the following components:   RBC 3.27 (*)    Hemoglobin 9.3 (*)    HCT 28.4 (*)    All other components within normal limits  TROPONIN I (HIGH SENSITIVITY) - Abnormal; Notable for the following components:   Troponin I (High Sensitivity) 390 (*)    All other components within normal limits  TROPONIN I (HIGH SENSITIVITY)                                                                                                                          Radiology DG Chest 1 View  Result Date: 02/10/2022 CLINICAL DATA:  86 year old male with chest pain. Feel similar to prior MI. EXAM: CHEST  1 VIEW COMPARISON:  Chest radiographs 10/01/2020 and earlier. FINDINGS: PA view at 0946 hours. Stable lung volumes, hyperinflated. Stable mild eventration  of the diaphragm, normal variant. Mediastinal contours are stable and within normal limits. Visualized tracheal air column is within normal limits. Mild EKG button artifact in both upper lungs. No pneumothorax, pulmonary edema, pleural effusion or confluent pulmonary opacity. No acute osseous abnormality identified. Negative visible bowel gas. IMPRESSION: Chronic pulmonary hyperinflation. No acute cardiopulmonary abnormality. Electronically Signed   By: Genevie [name] M.D.   On: 02/10/2022 09:57    Pertinent labs & imaging results that were available during my care of the patient were reviewed by me and considered in my medical decision making (see MDM for details).  Medications Ordered in ED Medications  aspirin EC tablet 325 mg (325 mg Oral Given 02/10/22 1206)                                                                                                                                     Procedures .Critical Care  Performed by: Teressa Lower, MD Authorized by: Teressa Lower, MD   Critical care provider statement:    Critical care time (minutes):  30   Critical care was necessary to  treat or prevent imminent or life-threatening deterioration of the following conditions:  Cardiac failure   Critical care was time spent personally by me on the following activities:  Development of treatment plan with patient or surrogate, discussions with consultants, evaluation of patient's response to treatment, examination of patient, ordering and review of laboratory studies, ordering and review of radiographic studies, ordering and performing treatments and interventions, pulse oximetry, re-evaluation of patient's condition and review of old charts   (including critical care time)  Medical Decision Making / ED Course   This patient presents to the ED for concern of chest pain, this involves an extensive number of treatment options, and is a complaint that carries with it a high risk of complications and morbidity.  The differential diagnosis includes ACS, PE, symptomatic bradycardia, NSTEMI  MDM: Patient seen emergency room for evaluation of chest pain.  Physical exam with a systolic murmur as well as an irregular bradycardia but is otherwise unremarkable.  Laboratory evaluation with a hemoglobin of 9.3, sodium 128, BUN 39, creatinine 2.54 with a GFR of 24.  Initial troponin is elevated to 390.  Initial ECG with LVH and a right bundle branch block but no obvious STEMI.  Patient given aspirin on arrival and started on a heparin drip.  Chest x-ray with no acute pathology.  Cardiology consulted who recommended medical admission for assistance with chronic kidney disease and ultimate catheterization while inpatient.   Additional history obtained: -Additional history obtained from multiple family members -External records from outside source obtained and reviewed including: Chart review including previous notes, labs, imaging, consultation notes   Lab Tests: -I ordered, reviewed, and interpreted labs.   The pertinent results include:   Labs Reviewed  BASIC METABOLIC PANEL - Abnormal;  Notable for the following components:      Result  Value   Sodium 128 (*)    CO2 19 (*)    Glucose, Bld 314 (*)    BUN 39 (*)    Creatinine, Ser 2.54 (*)    GFR, Estimated 24 (*)    All other components within normal limits  CBC - Abnormal; Notable for the following components:   RBC 3.27 (*)    Hemoglobin 9.3 (*)    HCT 28.4 (*)    All other components within normal limits  TROPONIN I (HIGH SENSITIVITY) - Abnormal; Notable for the following components:   Troponin I (High Sensitivity) 390 (*)    All other components within normal limits  TROPONIN I (HIGH SENSITIVITY)      EKG   EKG Interpretation  Date/Time:  Monday February 10 2022 09:39:26 EDT Ventricular Rate:  82 PR Interval:    QRS Duration: 90 QT Interval:  370 QTC Calculation: 432 R Axis:   78 Text Interpretation: Normal sinus rhythm Left ventricular hypertrophy with repolarization abnormality ( Sokolow-Lyon ) Abnormal ECG When compared with ECG of 02-Oct-2020 06:46, PREVIOUS ECG IS PRESENT Confirmed by [place] ([id number]) on 02/10/2022 11:37:54 AM         Imaging Studies ordered: I ordered imaging studies including chest x-ray I independently visualized and interpreted imaging. I agree with the radiologist interpretation   Medicines ordered and prescription drug management: Meds ordered this encounter  Medications   aspirin EC tablet 325 mg    -I have reviewed the patients home medicines and have made adjustments as needed  Critical interventions Heparin drip, aspirin  Consultations Obtained: I requested consultation with the cardiologist on-call Dr. Margaretann Loveless,  and discussed lab and imaging findings as well as pertinent plan - they recommend: Heparin drip and medical admission   Cardiac Monitoring: The patient was maintained on a cardiac monitor.  I personally viewed and interpreted the cardiac monitored which showed an underlying rhythm of: Sinus rhythm with intermittent heart block  Social  Determinants of Health:  Factors impacting patients care include: none   Reevaluation: After the interventions noted above, I reevaluated the patient and found that they have :stayed the same  Co morbidities that complicate the patient evaluation  Past Medical History:  Diagnosis Date   Anemia 12/09/2015   CAD in native artery 12/09/2015   Overview:  PCI/ stent 2001   Chronic diastolic heart failure ([place]) 12/09/2015   Coronary artery disease involving native coronary artery of native heart with angina pectoris ([place]) 12/09/2015   Overview:  PCI/ stent 2001   Hyperlipidemia 12/09/2015   Hypertensive heart disease with heart failure ([place]) 12/09/2015   Necrotizing pancreatitis 06/26/2015   Pseudocyst of pancreas 06/26/2015      Dispostion: I considered admission for this patient, and due to elevated troponin and likely need for catheterization, patient will require hospital admission     Final Clinical Impression(s) / ED Diagnoses Final diagnoses:  None     '@PCDICTATION'$ @    Teressa Lower, MD 02/10/22 1239

## 2022-02-10 NOTE — Telephone Encounter (Signed)
Called patient this morning to find out how they did over the weekend with the patients chest pain. On Saturday night he had to take 4 nitro and wear the patch, then on Sunday he was able to just wear the patch and have relief from chest pain. Today he has already had to take 1 nitro. After receiving the update from the patients wife I called Dr. Bettina Gavia to inform him of the patient's status and he recommended that he go to the the ER where the plan would be to admit him to the hospital in preparation for a heart catheterization. After speaking with Dr. Bettina Gavia I called the patient spouse and informed her of Dr. Joya Gaskins plan for the patient. They were in agreement and stated that they would go to the ER, and had no further questions at this time.

## 2022-02-10 NOTE — ED Provider Triage Note (Addendum)
Emergency Medicine Provider Triage Evaluation Note  Troy [name] , a 86 y.o. male  was evaluated in triage.  Pt complains of chest pain.  He sees Troy [name] with cardiology.  He has been having ongoing chest pain that has been getting more frequent. He has associated shortness of breath and dizziness. This has been worsening over this weekend and not improving with nitropaste.  He has consulted with Troy [name] who recommended he go to the emergency room to admit patient and get heart catheterization.  Review of Systems  Positive:  Negative:   Physical Exam  BP (!) 174/70 (BP Location: Right Arm)   Pulse 88   Temp 97.9 F (36.6 C) (Oral)   Resp 14   SpO2 99%  Gen:   Awake, no distress   Resp:  Normal effort  MSK:   Moves extremities without difficulty  Other:    Medical Decision Making  Medically screening exam initiated at 9:37 AM.  Appropriate orders placed.  Troy [name] was informed that the remainder of the evaluation will be completed by another provider, this initial triage assessment does not replace that evaluation, and the importance of remaining in the ED until their evaluation is complete.    Troy Birchwood, PA-C 02/10/22 0938    Troy Birchwood, PA-C 02/10/22 [phone]

## 2022-02-10 NOTE — ED Triage Notes (Signed)
Pt started having cp about 3 weeks ago. Pt states the pain is off and on. Worsens with movement. Pt feels sob, dizzy. Pt has nitro paste on his chest. He went to his MD office on Friday and was given the paste. Every 6 hours he places a new strip.

## 2022-02-10 NOTE — Progress Notes (Addendum)
ANTICOAGULATION CONSULT NOTE - Initial Consult  Pharmacy Consult for Heparin infusion Indication: chest pain/ACS  No Known Allergies  Patient Measurements: Height: '5\' 11"'$  (180.3 cm) Weight: 76.4 kg (168 lb 6.9 oz) IBW/kg (Calculated) : 75.3 Heparin Dosing Weight: 76.4 kg  Vital Signs: Temp: 97.9 F (36.6 C) (07/17 0935) Temp Source: Oral (07/17 0935) BP: 155/99 (07/17 1211) Pulse Rate: 76 (07/17 1211)  Labs: Recent Labs    02/10/22 0943  HGB 9.3*  HCT 28.4*  PLT 256  CREATININE 2.54*  TROPONINIHS 390*    Estimated Creatinine Clearance: 23.5 mL/min (A) (by C-G formula based on SCr of 2.54 mg/dL (H)).   Medical History: Past Medical History:  Diagnosis Date   Anemia 12/09/2015   CAD in native artery 12/09/2015   Overview:  PCI/ stent 2001   Chronic diastolic heart failure ([place]) 12/09/2015   Coronary artery disease involving native coronary artery of native heart with angina pectoris ([place]) 12/09/2015   Overview:  PCI/ stent 2001   Hyperlipidemia 12/09/2015   Hypertensive heart disease with heart failure ([place]) 12/09/2015   Necrotizing pancreatitis 06/26/2015   Pseudocyst of pancreas 06/26/2015    Medications:  (Not in a hospital admission)   Assessment: 86 yo M admitted for chest pain x3 wks. Pain is off/on and worsens with exertion. Endorses SOB and dizziness. Patient was not taking any oral anticoagulants prior to admission. Pharmacy consulted to dose heparin infusion for chest pain/ACS protocol.  Hgb 9.3, stable Plt 256, stable Troponin 390  Goal of Therapy:  Heparin level 0.3-0.7 units/ml Monitor platelets by anticoagulation protocol: Yes   Plan:  Give 4000 units bolus x 1 Start heparin infusion at 900 units/hr Check heparin level in 8 hours Monitor daily CBC, heparin level, and s/sx of infection  Kaleen Mask 02/10/2022,12:32 PM

## 2022-02-10 NOTE — H&P (Addendum)
[facility] History and Physical  Troy [name] XHB:[id number] DOB: 1935/11/28 DOA: 02/10/2022  Referring physician:  PCP: Street, [name], MD   Chief Complaint: Chest pain  HPI: Troy [name] is a 86 y.o. WM PMHx CAD, Mobitz type I (Wenckebach), HTN,, Aortic Stenosis, chronic diastolic CHF, HLD, necrotizing pancreatitis, CKD stage IV.  Presented seeing and waning CP, SOB, dizziness, Nitropaste on his chest.  Was seen by his cardiologist on 14 July Dr. Shirlee More.  Per his note were considering placement of a pacer vs cardiac catheterization.  ED physician spoke with Dr. Margaretann Loveless cardiology.  Patient states he believes he had an MRI 3 weeks ago (crushing chest pain, SOB, diaphoretic) did not seek treatment at that time.  Currently negative CP.   Review of Systems:  Covid vaccination;  Constitutional:  No weight loss, night sweats, Fevers, chills, fatigue.  HEENT:  No headaches, Difficulty swallowing,Tooth/dental problems,Sore throat,  No sneezing, itching, ear ache, nasal congestion, post nasal drip,  Cardio-vascular:  No chest pain, Orthopnea, PND, swelling in lower extremities, anasarca, dizziness, palpitations  GI:  No heartburn, indigestion, abdominal pain, nausea, vomiting, diarrhea, change in bowel habits, loss of appetite  Resp:  No shortness of breath with exertion or at rest. No excess mucus, no productive cough, No non-productive cough, No coughing up of blood.No change in color of mucus.No wheezing.No chest wall deformity  Skin:  no rash or lesions.  GU:  no dysuria, change in color of urine, no urgency or frequency. No flank pain.  Musculoskeletal:  No joint pain or swelling. No decreased range of motion. No back pain.  Psych:  No change in mood or affect. No depression or anxiety. No memory loss.   Past Medical History:  Diagnosis Date   Anemia 12/09/2015   CAD in native artery 12/09/2015   Overview:  PCI/ stent 2001   Chronic diastolic heart  failure ([facility]) 12/09/2015   Coronary artery disease involving native coronary artery of native heart with angina pectoris ([place]) 12/09/2015   Overview:  PCI/ stent 2001   Hyperlipidemia 12/09/2015   Hypertensive heart disease with heart failure ([place]) 12/09/2015   Necrotizing pancreatitis 06/26/2015   Pseudocyst of pancreas 06/26/2015   Past Surgical History:  Procedure Laterality Date   ABDOMINAL AORTOGRAM W/LOWER EXTREMITY Bilateral 09/10/2018   Procedure: ABDOMINAL AORTOGRAM W/LOWER EXTREMITY;  Surgeon: Elam Dutch, MD;  Location: [facility];  Service: Cardiovascular;  Laterality: Bilateral;   CHOLECYSTECTOMY     PERIPHERAL VASCULAR INTERVENTION Right 09/10/2018   Procedure: PERIPHERAL VASCULAR INTERVENTION;  Surgeon: Elam Dutch, MD;  Location: [facility];  Service: Cardiovascular;  Laterality: Right;  common iliac   TONSILLECTOMY AND ADENOIDECTOMY     TOTAL KNEE REVISION Bilateral    Social History:  reports that he has quit smoking. His smoking use included cigarettes. He has been exposed to tobacco smoke. He has never used smokeless tobacco. He reports that he does not drink alcohol and does not use drugs.  No Known Allergies  Family History  Problem Relation Age of Onset   Heart failure Mother    Diabetes Mother    Heart failure Father     Prior to Admission medications   Medication Sig Start Date End Date Taking? Authorizing Provider  amLODipine (NORVASC) 5 MG tablet Take 1 tablet (5 mg total) by mouth in the morning and at bedtime. 01/29/22   Richardo Priest, MD  ascorbic acid (VITAMIN C) 500 MG/5ML syrup Take 500 mg  by mouth daily.    [provider]  atorvastatin (LIPITOR) 80 MG tablet TAKE 1 TABLET EACH EVENING 08/29/19   Richardo Priest, MD  B Complex Vitamins (VITAMIN B COMPLEX) TABS Take 1 tablet by mouth daily.    [provider]  Cholecalciferol (VITAMIN D3) 50 MCG (2000 UT) TABS Take 4,000 Units by mouth daily.    [provider]  Cinnamon 500 MG capsule Take 500 mg by mouth daily.    [provider]  cloNIDine (CATAPRES) 0.1 MG tablet Take 1 tablet (0.1 mg total) by mouth 2 (two) times daily. 01/30/21   Richardo Priest, MD  clopidogrel (PLAVIX) 75 MG tablet Take 75 mg by mouth daily.    [provider]  co-enzyme Q-10 30 MG capsule Take 30 mg by mouth daily.    [provider]  doxazosin (CARDURA) 2 MG tablet Take 2 mg by mouth at bedtime.     [provider]  ferrous sulfate 325 (65 FE) MG EC tablet Take 325 mg by mouth daily with breakfast.    [provider]  hydrALAZINE (APRESOLINE) 25 MG tablet Take one tablet by mouth daily in the morning and at 12pm. Take two tablets by mouth daily at bedtime. 08/16/21   Richardo Priest, MD  insulin glargine (LANTUS) 100 UNIT/ML injection Inject 20 Units into the skin daily.    [provider]  Iron Combinations (IRON COMPLEX PO) Take 65 mg by mouth daily.    [provider]  lisinopril (ZESTRIL) 20 MG tablet TAKE ONE TABLET BY MOUTH every morning 10/24/21   Richardo Priest, MD  Multiple Vitamins-Minerals (EYE VITAMINS PO) Take 1 tablet by mouth daily.    [provider]  nitroGLYCERIN (NITRO-BID) 2 % ointment Apply 1 inch topically 4 (four) times daily. 02/07/22   Richardo Priest, MD  nitroGLYCERIN (NITROSTAT) 0.4 MG SL tablet Place 1 tablet (0.4 mg total) under the tongue every 5 (five) minutes as needed for chest pain. 05/16/21   Richardo Priest, MD  omeprazole (PRILOSEC) 40 MG capsule Take 40 mg by mouth daily. 09/02/19   [provider]  Pancrelipase, Lip-Prot-Amyl, (CREON) 24000-76000 units CPEP Take 1 capsule by mouth in the morning, at noon, and at bedtime. 12/16/19   [provider]  Propylene Glycol (SYSTANE BALANCE) 0.6 % SOLN Place 1 drop into both eyes at bedtime.    [provider]     Consultants:  Cardiology Dr. Margaretann Loveless  Procedures/Significant Events:     I have personally reviewed and interpreted all radiology studies and my findings are as above.   VENTILATOR SETTINGS:    Cultures   Antimicrobials:    Devices    LINES / TUBES:      Continuous Infusions:  sodium chloride     heparin      Physical Exam: Vitals:   02/10/22 1211 02/10/22 1215 02/10/22 1230 02/10/22 1321  BP: (!) 155/99 (!) 135/54 (!) 138/46   Pulse: 76 69 72   Resp: '15 15 16   '$ Temp:    98 F (36.7 C)  TempSrc:    Oral  SpO2: 100% 98% 99%   Weight: 76.4 kg     Height: '5\' 11"'$  (1.803 m)       Wt Readings from Last 3 Encounters:  02/10/22 76.4 kg  02/07/22 76.4 kg  11/21/21 76.3 kg    General: A/O x4, No acute respiratory distress Eyes: negative scleral hemorrhage, negative anisocoria, negative icterus  ENT: Negative Runny nose, negative gingival bleeding, Neck:  Negative scars, masses, torticollis, lymphadenopathy, JVD Lungs: Clear to auscultation bilaterally without wheezes or crackles Cardiovascular: Regular rate and rhythm without murmur gallop or rub normal S1 and S2 Abdomen: negative abdominal pain, nondistended, positive soft, bowel sounds, no rebound, no ascites, no appreciable mass Extremities: No significant cyanosis, clubbing, or edema bilateral lower extremities Skin: Negative rashes, lesions, ulcers Psychiatric:  Negative depression, negative anxiety, negative fatigue, negative mania  Central nervous system:  Cranial nerves II through XII intact, tongue/uvula midline, all extremities muscle strength 5/5, sensation intact throughout, negative expressive aphasia, negative receptive aphasia.        Labs on Admission:  Basic Metabolic Panel: Recent Labs  Lab 02/07/22 1154 02/10/22 0943  NA 129* 128*  K 5.5* 5.0  CL 97 98  CO2 20 19*  GLUCOSE 130* 314*  BUN 36* 39*  CREATININE 2.23* 2.54*  CALCIUM 9.9 9.0   Liver Function Tests: No results for input(s): "AST", "ALT", "ALKPHOS", "BILITOT", "PROT", "ALBUMIN" in the last  168 hours. No results for input(s): "LIPASE", "AMYLASE" in the last 168 hours. No results for input(s): "AMMONIA" in the last 168 hours. CBC: Recent Labs  Lab 02/07/22 1154 02/10/22 0943  WBC 8.7 7.2  HGB 9.6* 9.3*  HCT 29.1* 28.4*  MCV 87 86.9  PLT 262 256   Cardiac Enzymes: No results for input(s): "CKTOTAL", "CKMB", "CKMBINDEX", "TROPONINI" in the last 168 hours.  BNP (last 3 results) No results for input(s): "BNP" in the last 8760 hours.  ProBNP (last 3 results) No results for input(s): "PROBNP" in the last 8760 hours.  CBG: No results for input(s): "GLUCAP" in the last 168 hours.  Radiological Exams on Admission: DG Chest 1 View  Result Date: 02/10/2022 CLINICAL DATA:  86 year old male with chest pain. Feel similar to prior MI. EXAM: CHEST  1 VIEW COMPARISON:  Chest radiographs 10/01/2020 and earlier. FINDINGS: PA view at 0946 hours. Stable lung volumes, hyperinflated. Stable mild eventration of the diaphragm, normal variant. Mediastinal contours are stable and within normal limits. Visualized tracheal air column is within normal limits. Mild EKG button artifact in both upper lungs. No pneumothorax, pulmonary edema, pleural effusion or confluent pulmonary opacity. No acute osseous abnormality identified. Negative visible bowel gas. IMPRESSION: Chronic pulmonary hyperinflation. No acute cardiopulmonary abnormality. Electronically Signed   By: Genevie [name] M.D.   On: 02/10/2022 09:57    EKG: Independently reviewed.  When compared to EKG on 14 July no significant change  Assessment/Plan Principal Problem:   NSTEMI (non-ST elevated myocardial infarction) ([place]) Active Problems:   Anemia   Coronary artery disease involving native coronary artery of native heart with angina pectoris (HCC)   Chronic diastolic heart failure (HCC)   Hyperlipidemia   Necrotizing pancreatitis   Bradycardia   Type 2 diabetes mellitus with stage 4 chronic kidney disease, with long-term current use of  insulin (HCC)   Benign hypertension with chronic kidney disease, stage IV (HCC)   CKD (chronic kidney disease) stage 4, GFR 15-29 ml/min (HCC)   LVH (left ventricular hypertrophy)   NSTEMI - ED physician contacted [facility] and patient will probably receive cardiac catheterization today or tomorrow.  May also receive placement of pacemaker. - Heparin drip - Aspirin 81 mg daily - Clonidine 0.1 mg BID - Hold beta-blocker bradycardic - Hold ACE/ARB secondary to renal function -Echocardiogram stat  Chronic diastolic CHF - See NSTEMI -Strict in and out - Daily weight  CAD/LVH - See NSTEMI  Essential  HTN - See NSTEMI  DM type II with stage IV CKD - A1c pending - Sensitive SSI  HLD - Lipid panel pending  Necrotizing pancreatitis - Creon TID when patient starts eating  CKD stage IV(Baseline Cr ~2.1) Lab Results  Component Value Date   CREATININE 2.54 (H) 02/10/2022   CREATININE 2.23 (H) 02/07/2022   CREATININE 2.09 (H) 10/01/2020   CREATININE 2.12 (H) 10/01/2020   CREATININE 1.40 (H) 09/10/2018  - Normal saline 65m/hr  Hyponatremia - Most likely secondary to CHF, and dehydration - See CKD stage IV         Body mass index is 23.49 kg/m.  Mobility Assessment (last 72 hours)     Mobility Assessment   No documentation.           Code Status: Full (DVT Prophylaxis: Heparin drip  Family Communication: 7/17 wife at bedside for discussion of plan of care all questions answered Status is: Inpatient    Dispo: The patient is from: Home              Anticipated d/c is to: Home              Anticipated d/c date is: 3 days              Patient currently is not medically stable to d/c.     Data Reviewed: Care during the described time interval was provided by me .  I have reviewed this patient's available data, including medical history, events of note, physical examination, and all test results as part of my evaluation.   The patient is  critically ill with multiple organ systems failure and requires high complexity decision making for assessment and support, frequent evaluation and titration of therapies, application of advanced monitoring technologies and extensive interpretation of multiple databases. Critical Care Time devoted to patient care services described in this note  Time spent: 70 minutes   Troy [name], CSunburstHospitalists

## 2022-02-10 NOTE — Consult Note (Cosign Needed)
Cardiology Consultation:   Patient ID: Troy [name] MRN: [id number]; DOB: 10-02-1935  Admit date: 02/10/2022 Date of Consult: 02/10/2022  PCP:  Troy [name], Troy Mt, MD   [facility] Providers Cardiologist:  Troy More, MD   {   Patient Profile:   Troy [name] is a 86 y.o. male with a hx of CHF, HTN, CKD stage 4, CAD with prior PCI in 2001, aortic stenosis, second degree AV block, Mobitz 1 and 2-1 block, and PAD with prior stenting Right common iliac artery in 2020 who is being seen 02/10/2022 for the evaluation of chest pain at the request of Dr. Sherral [name].  History of Present Illness:   Troy [name] is followed by Dr. Bettina [name].   Zio patch placed in 3/ 2022 showed episodes of complete heart block with correlating symptoms. He was told to go to the ER and was admitted. Coreg was held. Telemetry showed NSR and Mobitz type I and brief nocturnal 2:1 AV block overnight. Pacemaker was deferred. He was discharged home. Echo at that time showed LVEF 55-60%, no WMA, G1DD, normal RV function, mild MR.   The patient was recently seen 02/07/2022 reporting chest pain relieved by NTG, with plan for direct admit for coronary angiography. SGLT2i was stopped in preparation for this. EKG showed SB with heart rate of 38 with 2-1 AV block. Direct admission was offered but the patient didn't want to wait over the weekend for a cardiac cath.  The patient presented to the ER 02/10/22 for chest pain. He reported persistent unstable angina over the weekend, relieved with NTG. The wife reported an episode of unstable angina 3 weeks ago, but didn't come in at that time.  In the ER BP 155/99, pulse 76bpm, afebrile, RR 15, 100% O2. Labs showed sodium 128, CO2 19, BG 314, Scr 2.54, BUN 39, Hgb 9.3. HS trop 390>549. CXR showed chronic pulmonary hyperinflation, no acute process. EKG showed NSR, 82bpm, LVH with repol abnormalities, nonspecific ST changes. He was started on IV heparin and admitted.   Past Medical History:   Diagnosis Date   Anemia 12/09/2015   CAD in native artery 12/09/2015   Overview:  PCI/ stent 2001   Chronic diastolic heart failure ([place]) 12/09/2015   Coronary artery disease involving native coronary artery of native heart with angina pectoris ([place]) 12/09/2015   Overview:  PCI/ stent 2001   Hyperlipidemia 12/09/2015   Hypertensive heart disease with heart failure ([place]) 12/09/2015   Necrotizing pancreatitis 06/26/2015   Pseudocyst of pancreas 06/26/2015    Past Surgical History:  Procedure Laterality Date   ABDOMINAL AORTOGRAM W/LOWER EXTREMITY Bilateral 09/10/2018   Procedure: ABDOMINAL AORTOGRAM W/LOWER EXTREMITY;  Surgeon: Elam Dutch, MD;  Location: [facility];  Service: Cardiovascular;  Laterality: Bilateral;   CHOLECYSTECTOMY     PERIPHERAL VASCULAR INTERVENTION Right 09/10/2018   Procedure: PERIPHERAL VASCULAR INTERVENTION;  Surgeon: Elam Dutch, MD;  Location: [facility];  Service: Cardiovascular;  Laterality: Right;  common iliac   TONSILLECTOMY AND ADENOIDECTOMY     TOTAL KNEE REVISION Bilateral      Home Medications:  Prior to Admission medications   Medication Sig Start Date End Date Taking? Authorizing Provider  acetaminophen (TYLENOL) 500 MG tablet Take 500-1,000 mg by mouth every 6 (six) hours as needed for moderate pain.   Yes [provider]  amLODipine (NORVASC) 5 MG tablet Take 1 tablet (5 mg total) by mouth in the morning and at bedtime. 01/29/22  Yes Richardo Priest,  MD  atorvastatin (LIPITOR) 80 MG tablet TAKE 1 TABLET EACH EVENING Patient taking differently: Take 80 mg by mouth daily. 08/29/19  Yes Richardo Priest, MD  B Complex Vitamins (VITAMIN B COMPLEX) TABS Take 1 tablet by mouth daily.   Yes [provider]  Cholecalciferol (VITAMIN D3) 50 MCG (2000 UT) TABS Take 4,000 Units by mouth daily.   Yes [provider]  Cinnamon 500 MG capsule Take 500 mg by mouth daily.   Yes [provider]  cloNIDine  (CATAPRES) 0.1 MG tablet Take 1 tablet (0.1 mg total) by mouth 2 (two) times daily. 01/30/21  Yes Richardo Priest, MD  clopidogrel (PLAVIX) 75 MG tablet Take 75 mg by mouth daily.   Yes [provider]  co-enzyme Q-10 30 MG capsule Take 30 mg by mouth daily.   Yes [provider]  doxazosin (CARDURA) 2 MG tablet Take 2 mg by mouth at bedtime.    Yes [provider]  hydrALAZINE (APRESOLINE) 25 MG tablet Take one tablet by mouth daily in the morning and at 12pm. Take two tablets by mouth daily at bedtime. Patient taking differently: Take 25-50 mg by mouth See admin instructions. Take one tablet ( 25 mg) by mouth daily in the morning and at 12pm. Take two tablets  ( 50 mg) by mouth daily at bedtime. 08/16/21  Yes Richardo Priest, MD  insulin glargine (LANTUS) 100 UNIT/ML injection Inject 20 Units into the skin daily.   Yes [provider]  Iron Combinations (IRON COMPLEX PO) Take 65 mg by mouth daily.   Yes [provider]  lisinopril (ZESTRIL) 20 MG tablet TAKE ONE TABLET BY MOUTH every morning Patient taking differently: Take 20 mg by mouth daily. 10/24/21  Yes Richardo Priest, MD  Multiple Vitamins-Minerals (EYE VITAMINS PO) Take 1 tablet by mouth daily.   Yes [provider]  nitroGLYCERIN (NITRO-BID) 2 % ointment Apply 1 inch topically 4 (four) times daily. 02/07/22  Yes Richardo Priest, MD  nitroGLYCERIN (NITROSTAT) 0.4 MG SL tablet Place 1 tablet (0.4 mg total) under the tongue every 5 (five) minutes as needed for chest pain. 05/16/21  Yes Richardo Priest, MD  omeprazole (PRILOSEC) 40 MG capsule Take 40 mg by mouth daily. 09/02/19  Yes [provider]  Pancrelipase, Lip-Prot-Amyl, (CREON) 24000-76000 units CPEP Take 1 capsule by mouth in the morning, at noon, and at bedtime. 12/16/19  Yes [provider]  Propylene Glycol (SYSTANE BALANCE) 0.6 % SOLN Place 1 drop into both eyes at bedtime.   Yes [provider]  vitamin C  (ASCORBIC ACID) 500 MG tablet Take 500 mg by mouth daily.   Yes [provider]    Inpatient Medications: Scheduled Meds:  heparin  4,000 Units Intravenous Once   sodium chloride flush  3 mL Intravenous Q12H   Continuous Infusions:  sodium chloride     heparin     PRN Meds: sodium chloride, acetaminophen, ondansetron (ZOFRAN) IV, sodium chloride flush  Allergies:   No Known Allergies  Social History:   Social History   Socioeconomic History   Marital status: Married    Spouse name: Not on file   Number of children: Not on file   Years of education: Not on file   Highest education level: Not on file  Occupational History   Not on file  Tobacco Use   Smoking status: Former    Types: Cigarettes    Passive exposure: Past   Smokeless  tobacco: Never  Vaping Use   Vaping Use: Never used  Substance and Sexual Activity   Alcohol use: No   Drug use: No   Sexual activity: Not on file  Other Topics Concern   Not on file  Social History Narrative   Not on file   Social Determinants of Health   Financial Resource Strain: Not on file  Food Insecurity: Not on file  Transportation Needs: Not on file  Physical Activity: Not on file  Stress: Not on file  Social Connections: Not on file  Intimate Partner Violence: Not on file    Family History:    Family History  Problem Relation Age of Onset   Heart failure Mother    Diabetes Mother    Heart failure Father      ROS:  Please see the history of present illness.   All other ROS reviewed and negative.     Physical Exam/Data:   Vitals:   02/10/22 0935 02/10/22 1211 02/10/22 1215 02/10/22 1230  BP: (!) 174/70 (!) 155/99 (!) 135/54 (!) 138/46  Pulse: 88 76 69 72  Resp: '14 15 15 16  '$ Temp: 97.9 F (36.6 C)     TempSrc: Oral     SpO2: 99% 100% 98% 99%  Weight:  76.4 kg    Height:  '5\' 11"'$  (1.803 m)     No intake or output data in the 24 hours ending 02/10/22 1316    02/10/2022   12:11 PM 02/07/2022    11:05 AM 11/21/2021   10:56 AM  Last 3 Weights  Weight (lbs) 168 lb 6.9 oz 168 lb 6.4 oz 168 lb 3.2 oz  Weight (kg) 76.4 kg 76.386 kg 76.295 kg     Body mass index is 23.49 kg/m.  General:  Well nourished, well developed, in no acute distress HEENT: normal Neck: no JVD Vascular: No carotid bruits; Distal pulses 2+ bilaterally Cardiac:  normal S1, S2; RRR; + murmur  Lungs:  clear to auscultation bilaterally, no wheezing, rhonchi or rales  Abd: soft, nontender, no hepatomegaly  Ext: no edema Musculoskeletal:  No deformities, BUE and BLE strength normal and equal Skin: warm and dry  Neuro:  CNs 2-12 intact, no focal abnormalities noted Psych:  Normal affect   EKG:  The EKG was personally reviewed and demonstrates:  NSR 82bpm, LVH with repolarization abnormalities with nonspecific ST changes Telemetry:  Telemetry was personally reviewed and demonstrates:  SR/SB HR 40-60s, 2:1 av block  Relevant CV Studies:  Echo 09/2020  1. Left ventricular ejection fraction, by estimation, is 55 to 60%. The  left ventricle has normal function. The left ventricle has no regional  wall motion abnormalities. Left ventricular diastolic parameters are  consistent with Grade I diastolic  dysfunction (impaired relaxation).   2. Right ventricular systolic function is normal. The right ventricular  size is normal.   3. The mitral valve is grossly normal. Mild mitral valve regurgitation.   4. The aortic valve is grossly normal. There is mild calcification of the  aortic valve. Aortic valve regurgitation is not visualized.  Laboratory Data:  High Sensitivity Troponin:   Recent Labs  Lab 02/10/22 0943 02/10/22 1132  TROPONINIHS 390* 549*     Chemistry Recent Labs  Lab 02/07/22 1154 02/10/22 0943  NA 129* 128*  K 5.5* 5.0  CL 97 98  CO2 20 19*  GLUCOSE 130* 314*  BUN 36* 39*  CREATININE 2.23* 2.54*  CALCIUM 9.9 9.0  GFRNONAA  --  24*  ANIONGAP  --  11    No results for input(s): "PROT",  "ALBUMIN", "AST", "ALT", "ALKPHOS", "BILITOT" in the last 168 hours. Lipids No results for input(s): "CHOL", "TRIG", "HDL", "LABVLDL", "LDLCALC", "CHOLHDL" in the last 168 hours.  Hematology Recent Labs  Lab 02/07/22 1154 02/10/22 0943  WBC 8.7 7.2  RBC 3.35* 3.27*  HGB 9.6* 9.3*  HCT 29.1* 28.4*  MCV 87 86.9  MCH 28.7 28.4  MCHC 33.0 32.7  RDW 14.0 14.5  PLT 262 256   Thyroid No results for input(s): "TSH", "FREET4" in the last 168 hours.  BNPNo results for input(s): "BNP", "PROBNP" in the last 168 hours.  DDimer No results for input(s): "DDIMER" in the last 168 hours.   Radiology/Studies:  DG Chest 1 View  Result Date: 02/10/2022 CLINICAL DATA:  86 year old male with chest pain. Feel similar to prior MI. EXAM: CHEST  1 VIEW COMPARISON:  Chest radiographs 10/01/2020 and earlier. FINDINGS: PA view at 0946 hours. Stable lung volumes, hyperinflated. Stable mild eventration of the diaphragm, normal variant. Mediastinal contours are stable and within normal limits. Visualized tracheal air column is within normal limits. Mild EKG button artifact in both upper lungs. No pneumothorax, pulmonary edema, pleural effusion or confluent pulmonary opacity. No acute osseous abnormality identified. Negative visible bowel gas. IMPRESSION: Chronic pulmonary hyperinflation. No acute cardiopulmonary abnormality. Electronically Signed   By: Genevie [name] M.D.   On: 02/10/2022 09:57     Assessment and Plan:   NSTEMI CAD with remote PCI - presented with chest pain relieved by NTG, found to have elevated troponin - was recently seen by primary cardiologist for UA and cardiac cath was offered, but deferred at that time - Plavix '75mg'$  daily - No BB with bradycardia/high grade block - lisinopril held for CKD - Echo ordered - continue IV heparin -Not a good candidate for cardiac cath given CKD, however will discuss with MD. If creatinine is back to baseline can consider cath tomorrow. Can hydrate  overnight. Agree that he is a difficult candidate for cardiac apposition given his CKD 3B-4.  Baseline creatinine is just over 2, was 2.23 on the 14th and now is 2.64.  Would need to see it at least back to baseline prior to considering catheterization.   Regardless, would only consider diagnostic catheterization-with staged PCI if indicated. Doing a diagnostic catheterization would allow [place] to determine if there is life-threatening stenosis that would absolutely need to be treated prior to consideration of possible PPM.  Otherwise, would prefer to place PPM prior to PCI to avoid need to hold antiplatelet agent in the setting of recent stent.  High grade AV block - known h/o Mobitz type 1 and 2-1 AV block - tele appears to show 2:1 AV block - he may require pacemaker, can get EP input On telemetry clearly having episodes of 2-1 block with rates in the 30s.  Brief review with EP indicates probably would likely benefit from pacemaker.  If indicated, would be best to proceed with PPM placement prior to coronary PCI.  CKD stage 4-with acute on chronic insufficiency. - Scr 2.54, BUN 39 on admission - Baseline Scr around 2.1 Gentle overnight hydration and reassess in the morning to determine +/- catheterization on 7/18  HTN - clonidine 0.'1mg'$  BID  Hyponatremia - Na 128 -> hopefully benefit from IVF/NS infusion - daily BMET - does not appear volume overloaded on exam  Aortic valve stenosis - mild on last echo - repeat echo as above  For questions or  updates, please contact [facility] Please consult [email or web address] for contact info under    Signed, Cadence Ninfa Meeker, PA-C  02/10/2022 1:16 PM    ATTENDING ATTESTATION  I have seen, examined and evaluated the patient after being initially evaluated by Cadence Kathlen Mody, PA.  After reviewing all the available data and chart, we discussed the patients laboratory, study & physical findings as well as symptoms in detail. I agree with her findings,  examination as well as impression recommendations as per our discussion.    Attending adjustments noted in italics.   Very difficult situation as indicated by Dr. Bettina [name].  Patient clearly having progressive angina symptoms class IV leading up to current hospitalization with ACS/non-STEMI-elevated troponin.  He delayed presentation because of significant family event yesterday.  We are now in the setting of new regional wall motion abnormality seen on echo that may very well be related to the prolonged episode of pain several weeks ago. Unfortunately, his renal function makes it difficult to determine whether or not he can proceed with cardiac catheterization safely.  Need to see his renal function improving with IV fluids prior to considering diagnostic catheterization with staged PCI. Diagnostic cath would be to determine if there is any severe emergent lesions that need to be treated ad hoc despite renal insufficiency.  If stable disease, would then refer staged PCI to allow for renal recovery and also potential placement of pacemaker if indicated.  We will consult EP tomorrow formally I have already reviewed EKG findings with EP team.  Clearly it would be best to have PPM placed prior to cardiac catheterization.  Had a long conversation discussing this with the patient and family.   Glenetta Hew, MD Glenetta Hew, M.D., M.S. Interventional Cardiologist   Pager # [phone] Phone # [phone] [address] [place], [place] [address]

## 2022-02-10 NOTE — Progress Notes (Signed)
  Echocardiogram 2D Echocardiogram has been performed.  Troy [name] 02/10/2022, 2:43 PM

## 2022-02-10 NOTE — Progress Notes (Signed)
Heart Failure Navigator Progress Note  Following this hospitalization to assess for HV TOC readiness.   EF 55-60% ? Heart cath depending on renal function CKD 4 Creat 2.54  Flornce Record, BSN, RN Heart Failure [facility] manager Chat Only

## 2022-02-11 DIAGNOSIS — I5032 Chronic diastolic (congestive) heart failure: Secondary | ICD-10-CM | POA: Diagnosis present

## 2022-02-11 DIAGNOSIS — D631 Anemia in chronic kidney disease: Secondary | ICD-10-CM

## 2022-02-11 DIAGNOSIS — N1832 Chronic kidney disease, stage 3b: Secondary | ICD-10-CM | POA: Diagnosis not present

## 2022-02-11 DIAGNOSIS — E1169 Type 2 diabetes mellitus with other specified complication: Secondary | ICD-10-CM

## 2022-02-11 DIAGNOSIS — N184 Chronic kidney disease, stage 4 (severe): Secondary | ICD-10-CM | POA: Diagnosis present

## 2022-02-11 DIAGNOSIS — I5020 Unspecified systolic (congestive) heart failure: Secondary | ICD-10-CM | POA: Diagnosis not present

## 2022-02-11 DIAGNOSIS — I083 Combined rheumatic disorders of mitral, aortic and tricuspid valves: Secondary | ICD-10-CM | POA: Diagnosis present

## 2022-02-11 DIAGNOSIS — I442 Atrioventricular block, complete: Secondary | ICD-10-CM | POA: Diagnosis present

## 2022-02-11 DIAGNOSIS — E871 Hypo-osmolality and hyponatremia: Secondary | ICD-10-CM | POA: Diagnosis present

## 2022-02-11 DIAGNOSIS — I441 Atrioventricular block, second degree: Secondary | ICD-10-CM | POA: Diagnosis not present

## 2022-02-11 DIAGNOSIS — I452 Bifascicular block: Secondary | ICD-10-CM | POA: Diagnosis present

## 2022-02-11 DIAGNOSIS — I2510 Atherosclerotic heart disease of native coronary artery without angina pectoris: Secondary | ICD-10-CM | POA: Diagnosis not present

## 2022-02-11 DIAGNOSIS — Z833 Family history of diabetes mellitus: Secondary | ICD-10-CM | POA: Diagnosis not present

## 2022-02-11 DIAGNOSIS — Y831 Surgical operation with implant of artificial internal device as the cause of abnormal reaction of the patient, or of later complication, without mention of misadventure at the time of the procedure: Secondary | ICD-10-CM | POA: Diagnosis present

## 2022-02-11 DIAGNOSIS — T82855A Stenosis of coronary artery stent, initial encounter: Secondary | ICD-10-CM | POA: Diagnosis present

## 2022-02-11 DIAGNOSIS — Z96653 Presence of artificial knee joint, bilateral: Secondary | ICD-10-CM | POA: Diagnosis present

## 2022-02-11 DIAGNOSIS — Z87891 Personal history of nicotine dependence: Secondary | ICD-10-CM | POA: Diagnosis not present

## 2022-02-11 DIAGNOSIS — I130 Hypertensive heart and chronic kidney disease with heart failure and stage 1 through stage 4 chronic kidney disease, or unspecified chronic kidney disease: Secondary | ICD-10-CM | POA: Diagnosis present

## 2022-02-11 DIAGNOSIS — I214 Non-ST elevation (NSTEMI) myocardial infarction: Secondary | ICD-10-CM | POA: Diagnosis present

## 2022-02-11 DIAGNOSIS — K861 Other chronic pancreatitis: Secondary | ICD-10-CM | POA: Diagnosis present

## 2022-02-11 DIAGNOSIS — I25110 Atherosclerotic heart disease of native coronary artery with unstable angina pectoris: Secondary | ICD-10-CM | POA: Diagnosis present

## 2022-02-11 DIAGNOSIS — R7401 Elevation of levels of liver transaminase levels: Secondary | ICD-10-CM | POA: Diagnosis present

## 2022-02-11 DIAGNOSIS — R001 Bradycardia, unspecified: Secondary | ICD-10-CM | POA: Diagnosis not present

## 2022-02-11 DIAGNOSIS — Z8249 Family history of ischemic heart disease and other diseases of the circulatory system: Secondary | ICD-10-CM | POA: Diagnosis not present

## 2022-02-11 DIAGNOSIS — Z79899 Other long term (current) drug therapy: Secondary | ICD-10-CM | POA: Diagnosis not present

## 2022-02-11 DIAGNOSIS — E785 Hyperlipidemia, unspecified: Secondary | ICD-10-CM

## 2022-02-11 DIAGNOSIS — Z794 Long term (current) use of insulin: Secondary | ICD-10-CM | POA: Diagnosis not present

## 2022-02-11 DIAGNOSIS — Z7902 Long term (current) use of antithrombotics/antiplatelets: Secondary | ICD-10-CM | POA: Diagnosis not present

## 2022-02-11 DIAGNOSIS — E1151 Type 2 diabetes mellitus with diabetic peripheral angiopathy without gangrene: Secondary | ICD-10-CM | POA: Diagnosis present

## 2022-02-11 DIAGNOSIS — R079 Chest pain, unspecified: Secondary | ICD-10-CM | POA: Diagnosis not present

## 2022-02-11 DIAGNOSIS — E1122 Type 2 diabetes mellitus with diabetic chronic kidney disease: Secondary | ICD-10-CM | POA: Diagnosis present

## 2022-02-11 LAB — ABO/RH: ABO/RH(D): B POS

## 2022-02-11 LAB — IRON AND TIBC
Iron: 75 ug/dL (ref 45–182)
Saturation Ratios: 27 % (ref 17.9–39.5)
TIBC: 276 ug/dL (ref 250–450)
UIBC: 201 ug/dL

## 2022-02-11 LAB — RETICULOCYTES
Immature Retic Fract: 5.4 % (ref 2.3–15.9)
RBC.: 3.26 MIL/uL — ABNORMAL LOW (ref 4.22–5.81)
Retic Count, Absolute: 33.9 10*3/uL (ref 19.0–186.0)
Retic Ct Pct: 1 % (ref 0.4–3.1)

## 2022-02-11 LAB — COMPREHENSIVE METABOLIC PANEL
ALT: 21 U/L (ref 0–44)
AST: 33 U/L (ref 15–41)
Albumin: 3 g/dL — ABNORMAL LOW (ref 3.5–5.0)
Alkaline Phosphatase: 76 U/L (ref 38–126)
Anion gap: 9 (ref 5–15)
BUN: 42 mg/dL — ABNORMAL HIGH (ref 8–23)
CO2: 21 mmol/L — ABNORMAL LOW (ref 22–32)
Calcium: 8.9 mg/dL (ref 8.9–10.3)
Chloride: 103 mmol/L (ref 98–111)
Creatinine, Ser: 2.62 mg/dL — ABNORMAL HIGH (ref 0.61–1.24)
GFR, Estimated: 24 mL/min — ABNORMAL LOW (ref >60)
Glucose, Bld: 85 mg/dL (ref 70–99)
Potassium: 4.8 mmol/L (ref 3.5–5.1)
Sodium: 133 mmol/L — ABNORMAL LOW (ref 135–145)
Total Bilirubin: 0.6 mg/dL (ref 0.3–1.2)
Total Protein: 5.7 g/dL — ABNORMAL LOW (ref 6.5–8.1)

## 2022-02-11 LAB — HEPARIN LEVEL (UNFRACTIONATED): Heparin Unfractionated: 0.24 IU/mL — ABNORMAL LOW (ref 0.30–0.70)

## 2022-02-11 LAB — CBC
HCT: 24.7 % — ABNORMAL LOW (ref 39.0–52.0)
Hemoglobin: 8.5 g/dL — ABNORMAL LOW (ref 13.0–17.0)
MCH: 29.2 pg (ref 26.0–34.0)
MCHC: 34.4 g/dL (ref 30.0–36.0)
MCV: 84.9 fL (ref 80.0–100.0)
Platelets: 240 10*3/uL (ref 150–400)
RBC: 2.91 MIL/uL — ABNORMAL LOW (ref 4.22–5.81)
RDW: 14.6 % (ref 11.5–15.5)
WBC: 7.1 10*3/uL (ref 4.0–10.5)
nRBC: 0 % (ref 0.0–0.2)

## 2022-02-11 LAB — GLUCOSE, CAPILLARY
Glucose-Capillary: 130 mg/dL — ABNORMAL HIGH (ref 70–99)
Glucose-Capillary: 132 mg/dL — ABNORMAL HIGH (ref 70–99)
Glucose-Capillary: 138 mg/dL — ABNORMAL HIGH (ref 70–99)
Glucose-Capillary: 169 mg/dL — ABNORMAL HIGH (ref 70–99)
Glucose-Capillary: 86 mg/dL (ref 70–99)
Glucose-Capillary: 88 mg/dL (ref 70–99)

## 2022-02-11 LAB — PREPARE RBC (CROSSMATCH)

## 2022-02-11 LAB — HEMOGLOBIN AND HEMATOCRIT, BLOOD
HCT: 27.9 % — ABNORMAL LOW (ref 39.0–52.0)
Hemoglobin: 9.8 g/dL — ABNORMAL LOW (ref 13.0–17.0)

## 2022-02-11 LAB — VITAMIN B12: Vitamin B-12: 1023 pg/mL — ABNORMAL HIGH (ref 180–914)

## 2022-02-11 LAB — FOLATE: Folate: 17.8 ng/mL (ref >5.9)

## 2022-02-11 LAB — FERRITIN: Ferritin: 120 ng/mL (ref 24–336)

## 2022-02-11 LAB — MAGNESIUM: Magnesium: 2 mg/dL (ref 1.7–2.4)

## 2022-02-11 LAB — PHOSPHORUS: Phosphorus: 4.6 mg/dL (ref 2.5–4.6)

## 2022-02-11 MED ORDER — INSULIN GLARGINE-YFGN 100 UNIT/ML ~~LOC~~ SOLN
15.0000 [IU] | Freq: Every day | SUBCUTANEOUS | Status: DC
Start: 2022-02-11 — End: 2022-02-11

## 2022-02-11 MED ORDER — SODIUM CHLORIDE 0.9 % IV SOLN: INTRAVENOUS | Status: AC

## 2022-02-11 MED ORDER — ATORVASTATIN CALCIUM 80 MG PO TABS
80.0000 mg | ORAL_TABLET | Freq: Every day | ORAL | Status: DC
  Administered 2022-02-11 – 2022-02-14 (×4): 80 mg via ORAL
  Filled 2022-02-11 (×4): qty 1

## 2022-02-11 MED ORDER — AMLODIPINE BESYLATE 5 MG PO TABS
5.0000 mg | ORAL_TABLET | Freq: Every day | ORAL | Status: DC
  Administered 2022-02-11: 5 mg via ORAL
  Filled 2022-02-11: qty 1

## 2022-02-11 MED ORDER — AMLODIPINE BESYLATE 10 MG PO TABS
10.0000 mg | ORAL_TABLET | Freq: Every day | ORAL | Status: DC
Start: 2022-02-12 — End: 2022-02-15
  Administered 2022-02-12 – 2022-02-15 (×4): 10 mg via ORAL
  Filled 2022-02-11 (×4): qty 1

## 2022-02-11 MED ORDER — INSULIN GLARGINE-YFGN 100 UNIT/ML ~~LOC~~ SOLN
10.0000 [IU] | Freq: Every day | SUBCUTANEOUS | Status: DC
  Administered 2022-02-11 – 2022-02-15 (×5): 10 [IU] via SUBCUTANEOUS
  Filled 2022-02-11 (×5): qty 0.1

## 2022-02-11 MED ORDER — HYDRALAZINE HCL 25 MG PO TABS
25.0000 mg | ORAL_TABLET | Freq: Three times a day (TID) | ORAL | Status: DC
  Administered 2022-02-11 – 2022-02-15 (×12): 25 mg via ORAL
  Filled 2022-02-11 (×12): qty 1

## 2022-02-11 MED ORDER — SODIUM CHLORIDE 0.9% IV SOLUTION
Freq: Once | INTRAVENOUS | Status: AC
Start: 2022-02-11 — End: 2022-02-11

## 2022-02-11 NOTE — Assessment & Plan Note (Signed)
Distant history of PCI back in 2001.  I suspect that he has more significant disease now.  Inferior inferolateral hypokinesis on echo which suggest likely RCA distribution disease, but could very well have multivessel disease. Now with progressive anginal symptoms leading to ACS presentation and elevated troponins, we would like to proceed with cardiac catheterization pending improvement in renal function.  Plan will be stabilization with IV heparin and nitroglycerin until renal function improves.  Cardiac catheterization once stable renal function-this will be a staged endeavor in order to diagnostic catheterization and then allow for renal recovery before PCI. In the duration between diagnostic catheterization and PCI, would allow for EP to potentially consider pacemaker placement if indicated.   Continue aspirin, high-dose atorvastatin  No beta-blocker because of bradycardia-have DC clonidine for similar reason and will reinitiate amlodipine 5 mg.  Again no ARB/ACE inhibitor due to renal insufficiency.  On aspirin, but not Thienopyridine yet pending cardiac cath.

## 2022-02-11 NOTE — Assessment & Plan Note (Signed)
LDL 59, but with ACS presentation, started on atorvastatin 80 mg daily.

## 2022-02-11 NOTE — Assessment & Plan Note (Addendum)
Diabetes managed by primary team. Currently on sliding scale insulin.

## 2022-02-11 NOTE — Assessment & Plan Note (Addendum)
Unclear true etiology, suspect this may be partly related to blood draws and being on IV heparin, no clear signs of bleeding.  Low threshold to consider evaluation with CT scan,  With ongoing symptoms and with worsening renal function, may benefit from transfusion of 1 unit.  We will check a anemia panel first and transfuse 1 unit.

## 2022-02-11 NOTE — Progress Notes (Signed)
Troy [name]                                                                               [name], is a 86 y.o. male, DOB - [birth year], UXN:[id number] Admit date - 02/10/2022    Outpatient Primary MD for the patient is Street, [name], MD  LOS - 0  days    Brief summary    Troy [name] is a 86 y.o. with prior h/o CAD, Mobitz type I (Wenckebach), HTN,, Aortic Stenosis, chronic diastolic CHF, HLD, necrotizing pancreatitis, CKD stage IV  presented with sob, chest pain and dizziness.  Cardiology on board.   Assessment & Plan    Assessment and Plan:  NSTEMI Patient reports persistent chest pain over the last few days relieved with nitroglycerin. Continue with Plavix 75 mg, atorvastatin 80 mg daily, currently on on IV heparin, not on beta-blocker due to bradycardia and high-grade block Echocardiogram showed left ventricular ejection fraction of 55 to 60% with regional wall abnormalities.  Left ventricular diastolic function is indeterminate. Unfortunately patient is not a great candidate for cardiac cath due to CKD. Patient is being gently hydrated to see if his renal parameters should improve.    Bradycardia with high-grade AV block in the setting of known history of Mobitz type I AV block Will need evaluation by EP and see if he is a candidate for pacemaker.    Stage IV CKD Baseline creatinine around 2.1 admitted with a creatinine of 2.5 and currently at 2.6. Gently hydrate and repeat renal parameters in the morning.    Essential hypertension Blood pressure parameters are improving Patient is on amlodipine 5 mg daily.     Chronic normocytic anemia Hemoglobin around 8.5, admitted with a hemoglobin of 9.6. Anemia panel unremarkable Stool for occult blood ordered.   Type 2 DM, insulin dependent,  CBG (last 3)  Recent Labs    02/11/22 0009 02/11/22 0418 02/11/22 1133  GLUCAP 88 86 138*   CBG'S are well controlled. Hemoglobin A1c is  6.8%.  Started the patient on Semglee 10 units , he was on 20 units of Lantus at home.   Hyperlipidemia:  On statin.  Lipid panel reviewed.    Chronic pancreatitis:  On creon 24,000 units TID.    Estimated body mass index is 24.07 kg/m as calculated from the following:   Height as of this encounter: '5\' 9"'$  (1.753 m).   Weight as of this encounter: 73.9 kg.  Code Status: full code.  DVT Prophylaxis:  Heparin   Level of Care: Level of care: Telemetry Cardiac Family Communication: Updated patient's FAMILY at bedside.   Disposition Plan:     Remains inpatient appropriate:  further work for NSTEMI.   Procedures:  None.  Consultants:   Cardiology.   Antimicrobials:   Anti-infectives (From admission, onward)    None        Medications  Scheduled Meds:  amLODipine  5 mg Oral Daily   clopidogrel  75 mg Oral Daily   insulin aspart  0-9 Units Subcutaneous Q4H   lipase/protease/amylase  24,000 Units Oral TID AC   sodium chloride flush  3 mL Intravenous Q12H  Continuous Infusions:  sodium chloride     sodium chloride 50 mL/hr at 02/11/22 1012   heparin 1,100 Units/hr (02/11/22 0841)   nitroGLYCERIN Stopped (02/11/22 0728)   PRN Meds:.sodium chloride, acetaminophen, nitroGLYCERIN, ondansetron (ZOFRAN) IV, sodium chloride flush    Subjective:   Troy [name] was seen and examined today.  No chest pain today , no sob, in good spirits, wife at bedside, answered all questions.   Objective:   Vitals:   02/10/22 2021 02/11/22 0011 02/11/22 0350 02/11/22 0738  BP: (!) 180/51 (!) 153/73 117/64 (!) 153/69  Pulse: 75 67 70 73  Resp:    19  Temp: 97.6 F (36.4 C) 97.9 F (36.6 C) 97.6 F (36.4 C) 98.5 F (36.9 C)  TempSrc: Oral Oral Oral Oral  SpO2: 97% 98% 97% 97%  Weight:   73.9 kg   Height:        Intake/Output Summary (Last 24 hours) at 02/11/2022 1139 Last data filed at 02/11/2022 1012 Gross per 24 hour  Intake 645.83 ml  Output 350 ml  Net 295.83 ml    Filed Weights   02/10/22 1211 02/10/22 1821 02/11/22 0350  Weight: 76.4 kg 64.2 kg 73.9 kg     Exam General exam: Appears calm and comfortable  Respiratory system: Clear to auscultation. Respiratory effort normal. Cardiovascular system: S1 & S2 heard, RRR. No JVD, murmurs, rubs, gallops or clicks. No pedal edema. Gastrointestinal system: Abdomen is nondistended, soft and nontender.  Normal bowel sounds heard. Central nervous system: Alert and oriented. No focal neurological deficits. Extremities: Symmetric 5 x 5 power. Skin: No rashes, lesions or ulcers Psychiatry:  Mood & affect appropriate.      Data Reviewed:  I have personally reviewed following labs and imaging studies   CBC Lab Results  Component Value Date   WBC 7.1 02/11/2022   RBC 2.91 (L) 02/11/2022   HGB 8.5 (L) 02/11/2022   HCT 24.7 (L) 02/11/2022   MCV 84.9 02/11/2022   MCH 29.2 02/11/2022   PLT 240 02/11/2022   MCHC 34.4 02/11/2022   RDW 14.6 02/54/2706     Last metabolic panel Lab Results  Component Value Date   NA 133 (L) 02/11/2022   K 4.8 02/11/2022   CL 103 02/11/2022   CO2 21 (L) 02/11/2022   BUN 42 (H) 02/11/2022   CREATININE 2.62 (H) 02/11/2022   GLUCOSE 85 02/11/2022   GFRNONAA 24 (L) 02/11/2022   GFRAA 59 (L) 04/26/2018   CALCIUM 8.9 02/11/2022   PHOS 4.6 02/11/2022   PROT 5.7 (L) 02/11/2022   ALBUMIN 3.0 (L) 02/11/2022   BILITOT 0.6 02/11/2022   ALKPHOS 76 02/11/2022   AST 33 02/11/2022   ALT 21 02/11/2022   ANIONGAP 9 02/11/2022    CBG (last 3)  Recent Labs    02/11/22 0009 02/11/22 0418 02/11/22 1133  GLUCAP 88 86 138*      Coagulation Profile: No results for input(s): "INR", "PROTIME" in the last 168 hours.   Radiology Studies: ECHOCARDIOGRAM COMPLETE  Result Date: 02/10/2022    ECHOCARDIOGRAM REPORT   Patient Name:   Troy [name] Date of Exam: 02/10/2022 Medical Rec #:  [id number]      Height:       71.0 in Accession #:    [id number]     Weight:       168.4  lb Date of Birth:  March 01, 1936      BSA:          1.960 m Patient  Age:    89 years       BP:           138/46 mmHg Patient Gender: M              HR:           47 bpm. Exam Location:  Inpatient Procedure: 2D Echo, Cardiac Doppler, Color Doppler and 3D Echo                       STAT ECHO Reported to: Dr. Dia Crawford on 02/10/2022 3:00:00 PM. Indications:    NSTEMI I21.4  History:        Patient has prior history of Echocardiogram examinations, most                 recent 10/02/2020. CAD; Risk Factors:Dyslipidemia.  Sonographer:    Bernadene Person RDCS Referring Phys: Geraldo Docker WOODS IMPRESSIONS  1. Left ventricular ejection fraction, by estimation, is 55 to 60%. The left ventricle has normal function. The left ventricle demonstrates regional wall motion abnormalities (see scoring diagram/findings for description). Left ventricular diastolic parameters are indeterminate. There is mild hypokinesis of the left ventricular, entire inferior wall. There is mild hypokinesis of the left ventricular, basal inferolateral wall.  2. Right ventricular systolic function is normal. The right ventricular size is normal. There is normal pulmonary artery systolic pressure.  3. Left atrial size was moderately dilated.  4. The mitral valve is normal in structure. Mild to moderate mitral valve regurgitation. No evidence of mitral stenosis.  5. The aortic valve is calcified. There is moderate calcification of the aortic valve. There is moderate thickening of the aortic valve. Aortic valve regurgitation is not visualized. Moderate aortic valve stenosis.  6. The inferior vena cava is normal in size with greater than 50% respiratory variability, suggesting right atrial pressure of 3 mmHg. Comparison(s): Prior images reviewed side by side. Changes from prior study are noted. Overall similar LVEF, but current study with mild hypokinesis of the inferior/inferolateral wall as noted. Conclusion(s)/Recommendation(s): New mild wall motion abnormalities  with preserved EF. Moderate aortic stenosis. Sinus bradycardia (as low as 37 bpm) noted throughout study. FINDINGS  Left Ventricle: Left ventricular ejection fraction, by estimation, is 55 to 60%. The left ventricle has normal function. The left ventricle demonstrates regional wall motion abnormalities. Mild hypokinesis of the left ventricular, entire inferior wall. Mild hypokinesis of the left ventricular, basal inferolateral wall. The left ventricular internal cavity size was normal in size. There is no left ventricular hypertrophy. Left ventricular diastolic parameters are indeterminate. Right Ventricle: The right ventricular size is normal. Right vetricular wall thickness was not well visualized. Right ventricular systolic function is normal. There is normal pulmonary artery systolic pressure. The tricuspid regurgitant velocity is 2.75 m/s, and with an assumed right atrial pressure of 3 mmHg, the estimated right ventricular systolic pressure is 16.1 mmHg. Left Atrium: Left atrial size was moderately dilated. Right Atrium: Right atrial size was normal in size. Pericardium: There is no evidence of pericardial effusion. Mitral Valve: The mitral valve is normal in structure. There is moderate thickening of the mitral valve leaflet(s). There is moderate calcification of the mitral valve leaflet(s). Mild to moderate mitral valve regurgitation. No evidence of mitral valve stenosis. Tricuspid Valve: The tricuspid valve is normal in structure. Tricuspid valve regurgitation is mild . No evidence of tricuspid stenosis. Aortic Valve: The aortic valve is calcified. There is moderate calcification of the aortic valve. There is moderate thickening  of the aortic valve. Aortic valve regurgitation is not visualized. Moderate aortic stenosis is present. Aortic valve mean gradient measures 23.3 mmHg. Aortic valve peak gradient measures 40.6 mmHg. Aortic valve area, by VTI measures 0.99 cm. Pulmonic Valve: The pulmonic valve was  not well visualized. Pulmonic valve regurgitation is not visualized. No evidence of pulmonic stenosis. Aorta: The aortic root and ascending aorta are structurally normal, with no evidence of dilitation. Venous: The inferior vena cava is normal in size with greater than 50% respiratory variability, suggesting right atrial pressure of 3 mmHg. IAS/Shunts: The interatrial septum was not well visualized.  LEFT VENTRICLE PLAX 2D LVIDd:         4.80 cm      Diastology LVIDs:         3.40 cm      LV e' medial:    9.81 cm/s LV PW:         1.10 cm      LV E/e' medial:  13.0 LV IVS:        1.10 cm      LV e' lateral:   10.20 cm/s LVOT diam:     2.10 cm      LV E/e' lateral: 12.5 LV SV:         87 LV SV Index:   44 LVOT Area:     3.46 cm                              3D Volume EF: LV Volumes (MOD)            3D EF:        50 % LV vol d, MOD A2C: 120.0 ml LV EDV:       178 ml LV vol d, MOD A4C: 121.0 ml LV ESV:       89 ml LV vol s, MOD A2C: 46.0 ml  LV SV:        89 ml LV vol s, MOD A4C: 51.0 ml LV SV MOD A2C:     74.0 ml LV SV MOD A4C:     121.0 ml LV SV MOD BP:      75.4 ml RIGHT VENTRICLE RV S prime:     13.10 cm/s TAPSE (M-mode): 2.3 cm LEFT ATRIUM             Index        RIGHT ATRIUM           Index LA diam:        4.80 cm 2.45 cm/m   RA Area:     14.40 cm LA Vol (A2C):   69.4 ml 35.41 ml/m  RA Volume:   32.90 ml  16.78 ml/m LA Vol (A4C):   74.9 ml 38.21 ml/m LA Biplane Vol: 75.4 ml 38.47 ml/m  AORTIC VALVE AV Area (Vmax):    1.10 cm AV Area (Vmean):   0.88 cm AV Area (VTI):     0.99 cm AV Vmax:           318.67 cm/s AV Vmean:          228.333 cm/s AV VTI:            0.878 m AV Peak Grad:      40.6 mmHg AV Mean Grad:      23.3 mmHg LVOT Vmax:         101.00 cm/s LVOT Vmean:  58.100 cm/s LVOT VTI:          0.250 m LVOT/AV VTI ratio: 0.28  AORTA Ao Root diam: 3.30 cm Ao Asc diam:  3.50 cm MITRAL VALVE                  TRICUSPID VALVE MV Area (PHT): 5.13 cm       TR Peak grad:   30.2 mmHg MV Decel Time: 148  msec       TR Vmax:        275.00 cm/s MR Peak grad:    141.1 mmHg MR Mean grad:    97.0 mmHg    SHUNTS MR Vmax:         594.00 cm/s  Systemic VTI:  0.25 m MR Vmean:        471.0 cm/s   Systemic Diam: 2.10 cm MR PISA:         1.01 cm MR PISA Eff ROA: 7 mm MR PISA Radius:  0.40 cm MV E velocity: 128.00 cm/s MV A velocity: 79.10 cm/s MV E/A ratio:  1.62 Buford Dresser MD Electronically signed by Buford Dresser MD Signature Date/Time: 02/10/2022/3:01:01 PM    Final    DG Chest 1 View  Result Date: 02/10/2022 CLINICAL DATA:  86 year old male with chest pain. Feel similar to prior MI. EXAM: CHEST  1 VIEW COMPARISON:  Chest radiographs 10/01/2020 and earlier. FINDINGS: PA view at 0946 hours. Stable lung volumes, hyperinflated. Stable mild eventration of the diaphragm, normal variant. Mediastinal contours are stable and within normal limits. Visualized tracheal air column is within normal limits. Mild EKG button artifact in both upper lungs. No pneumothorax, pulmonary edema, pleural effusion or confluent pulmonary opacity. No acute osseous abnormality identified. Negative visible bowel gas. IMPRESSION: Chronic pulmonary hyperinflation. No acute cardiopulmonary abnormality. Electronically Signed   By: Genevie [name] M.D.   On: 02/10/2022 09:57       Hosie Poisson M.D. Troy [name] 02/11/2022, 11:39 AM  Available via Epic secure chat 7am-7pm After 7 pm, please refer to night coverage provider listed on amion.

## 2022-02-11 NOTE — Progress Notes (Signed)
ANTICOAGULATION CONSULT NOTE  Pharmacy Consult for Heparin infusion Indication: chest pain/ACS  No Known Allergies  Patient Measurements: Height: '5\' 9"'$  (175.3 cm) Weight: 73.9 kg (163 lb) IBW/kg (Calculated) : 70.7 Heparin Dosing Weight: 76.4 kg  Vital Signs: Temp: 98.5 F (36.9 C) (07/18 0738) Temp Source: Oral (07/18 0738) BP: 153/69 (07/18 0738) Pulse Rate: 73 (07/18 0738)  Labs: Recent Labs    02/10/22 0943 02/10/22 1132 02/10/22 1607 02/10/22 1745 02/10/22 2039 02/11/22 0349  HGB 9.3*  --   --   --   --  8.5*  HCT 28.4*  --   --   --   --  24.7*  PLT 256  --   --   --   --  240  HEPARINUNFRC  --   --   --   --  0.45 0.24*  CREATININE 2.54*  --   --   --   --  2.62*  TROPONINIHS 390*   < > 2,109* 2,800* 2,703*  --    < > = values in this interval not displayed.     Estimated Creatinine Clearance: 21.4 mL/min (A) (by C-G formula based on SCr of 2.62 mg/dL (H)).   Assessment: 86 yo M admitted for chest pain x3 wks. Pain is off/on and worsens with exertion. Endorses SOB and dizziness. Patient was not taking any oral anticoagulants prior to admission. Pharmacy consulted to dose heparin infusion for chest pain/ACS protocol.  Heparin subtherapeutic at 0.24, CBC stable.  Goal of Therapy:  Heparin level 0.3-0.7 units/ml Monitor platelets by anticoagulation protocol: Yes   Plan:  Increase heparin to 1100 units/hr Monitor daily CBC, heparin level  Arrie Senate, PharmD, BCPS, [facility] Clinical Pharmacist [phone] Please check AMION for all [facility] Pharmacy numbers 02/11/2022

## 2022-02-11 NOTE — H&P (View-Only) (Signed)
Progress Note  Patient Name: Troy [name] Date of Encounter: 02/11/2022  [facility] Cardiologist: Shirlee More, MD    Patient Profile     86 y.o. male with a hx of CHF, HTN, CKD stage 4, CAD with prior PCI in 2001, aortic stenosis, second degree AV block, Mobitz 1 and 2-1 block, and PAD with prior stenting Right common iliac artery in 2020 => consulted on 02/10/2022 for the evaluation of chest pain/ACS at the request of Dr. Sherral Hammers.  => Subsequently ruled in for non-ST elevation MI with troponin of greater than 2700.  Assessment & Plan    Principal Problem:   NSTEMI (non-ST elevated myocardial infarction) ([place]) Active Problems:   Coronary artery disease involving native coronary artery of native heart with unstable angina pectoris (HCC)   HFrEF (heart failure with reduced ejection fraction) (HCC) => combination hypertensive heart disease and CAD related   Hyperlipidemia associated with type 2 diabetes mellitus (HCC)   Bradycardia   Type 2 diabetes mellitus with stage 4 chronic kidney disease, with long-term current use of insulin (HCC)   Anemia   CKD (chronic kidney disease) stage 4, GFR 15-29 ml/min [facility])  Hospital Problem List as of 02/11/2022          Priority Resolved POA     1.     * (Principal) NSTEMI (non-ST elevated myocardial infarction) ([place]) 1.  Yes    Current Assessment & Plan 02/10/2022 Hospital Encounter Written 02/11/2022 12:07 PM by Leonie Man, MD     Clearly has had progressive angina symptoms over the last several weeks now presenting with another episode of chest discomfort and troponin elevation to 2700.  I suspect he may very well have pretty significant disease minutes multiple vessel distribution.  At this point we are waiting to stabilize out his renal function in order to consider cardiac catheterization. Currently chest pain-free on IV heparin and IV nitroglycerin. No beta-blocker because of bradycardia No ACE inhibitor/ARB due to renal  insufficiency We will reinitiate clopidogrel and stop clonidine High-dose statin         Coronary artery disease involving native coronary artery of native heart with unstable angina pectoris ([place]) 1.  Yes    Overview Signed 03/09/2017  1:41 PM by Stevan Born, CMA     Overview:  PCI/ stent 2001       Current Assessment & Plan 02/10/2022 Hospital Encounter Written 02/11/2022 12:09 PM by Leonie Man, MD     Distant history of PCI back in 2001.  I suspect that he has more significant disease now.  Inferior inferolateral hypokinesis on echo which suggest likely RCA distribution disease, but could very well have multivessel disease. Now with progressive anginal symptoms leading to ACS presentation and elevated troponins, we would like to proceed with cardiac catheterization pending improvement in renal function.  Plan will be stabilization with IV heparin and nitroglycerin until renal function improves.  Cardiac catheterization once stable renal function-this will be a staged endeavor in order to diagnostic catheterization and then allow for renal recovery before PCI. In the duration between diagnostic catheterization and PCI, would allow for EP to potentially consider pacemaker placement if indicated.  Continue aspirin, high-dose atorvastatin No beta-blocker because of bradycardia-have DC clonidine for similar reason and will reinitiate amlodipine 5 mg. Again no ARB/ACE inhibitor due to renal insufficiency. On aspirin, but not Thienopyridine yet pending cardiac cath.        2.     HFrEF (heart failure with  reduced ejection fraction) (HCC) => combination hypertensive heart disease and CAD related 2.  Yes    Current Assessment & Plan 02/10/2022 Hospital Encounter Written 02/11/2022 12:05 PM by Leonie Man, MD     Most likely HFpEF with elevated BNP, but does not seem to be volume overloaded.  Echocardiogram did not suggest significant DHF.  May consider right and left heart cath if  and when we proceed with cardiac catheterization.        3.     Hyperlipidemia associated with type 2 diabetes mellitus ([place]) 3.  Yes    Current Assessment & Plan 02/10/2022 Hospital Encounter Written 02/11/2022 12:03 PM by Leonie Man, MD     LDL 59, but with ACS presentation, started on atorvastatin 80 mg daily.        Bradycardia 3.  Yes    Current Assessment & Plan 02/10/2022 Hospital Encounter Written 02/11/2022 12:10 PM by Leonie Man, MD     Complicated bradycardia, will defer description to the EP service has been consulted today.  He has clear evidence of first AV block and Wenckebach block, but is also having episodes of pretty slow heart rates with 2: 1 AV block.  I do not see any pauses or 3  AV block at this point, but with the extent of bradycardia we are certainly not able to use beta-blocker or AV any AV nodal agents.  With him having CAD beta-blockers would be indicated and this would potentially drive the indication for possible pacemaker.  Appreciate EP team consultation today.         Type 2 diabetes mellitus with stage 4 chronic kidney disease, with long-term current use of insulin ([place]) 3.  Not Applicable    Current Assessment & Plan 02/10/2022 Hospital Encounter Edited 02/11/2022 12:04 PM by Leonie Man, MD     Diabetes managed by primary team. Currently on sliding scale insulin.        Unprioritized     Anemia   Yes    Current Assessment & Plan 02/10/2022 Hospital Encounter Edited 02/11/2022  2:43 PM by Leonie Man, MD     Unclear true etiology, suspect this may be partly related to blood draws and being on IV heparin, no clear signs of bleeding.  Low threshold to consider evaluation with CT scan,  With ongoing symptoms and with worsening renal function, may benefit from transfusion of 1 unit.  We will check a anemia panel first and transfuse 1 unit.        CKD (chronic kidney disease) stage 4, GFR 15-29 ml/min [facility])   Yes    Current Assessment &  Plan 02/10/2022 Hospital Encounter Written 02/11/2022 12:01 PM by Leonie Man, MD     Unfortunately, her renal function has not stabilized, creatinine has gone up to 2.62 from 2.23 back on the 14th.  Would like to see at least stabilize prior to considering cardiac catheterization.  As such we will hold off on catheterization today.  Thankfully, his sodium levels and potassium have improved.  With mild worsening of renal function, will hold off on catheterization until this is stabilized.          Subjective   Resting comfortably.  No significant chest pain or pressure. Stable overnight. Much better.  Inpatient Medications    Scheduled Meds:  amLODipine  5 mg Oral Daily   atorvastatin  80 mg Oral QHS   clopidogrel  75 mg Oral Daily   insulin aspart  0-9 Units Subcutaneous Q4H   insulin glargine-yfgn  10 Units Subcutaneous Daily   lipase/protease/amylase  24,000 Units Oral TID AC   sodium chloride flush  3 mL Intravenous Q12H  Atorvastatin 80 mg ordered along with starting amlodipine today.  Continuous Infusions:  sodium chloride     heparin 1,100 Units/hr (02/11/22 0841)   nitroGLYCERIN Stopped (02/11/22 0728)   PRN Meds: sodium chloride, acetaminophen, nitroGLYCERIN, ondansetron (ZOFRAN) IV, sodium chloride flush   Vital Signs    Vitals:   02/10/22 2021 02/11/22 0011 02/11/22 0350 02/11/22 0738  BP: (!) 180/51 (!) 153/73 117/64 (!) 153/69  Pulse: 75 67 70 73  Resp:    19  Temp: 97.6 F (36.4 C) 97.9 F (36.6 C) 97.6 F (36.4 C) 98.5 F (36.9 C)  TempSrc: Oral Oral Oral Oral  SpO2: 97% 98% 97% 97%  Weight:   73.9 kg   Height:        Intake/Output Summary (Last 24 hours) at 02/11/2022 1500 Last data filed at 02/11/2022 1342 Gross per 24 hour  Intake 645.83 ml  Output 1100 ml  Net -454.17 ml      02/11/2022    3:50 AM 02/10/2022    6:21 PM 02/10/2022   12:11 PM  Last 3 Weights  Weight (lbs) 163 lb 141 lb 8.6 oz 168 lb 6.9 oz  Weight (kg) 73.936 kg 64.2  kg 76.4 kg      Telemetry    Wide range of heart rate from the 30s to 90s with narrow complex QRSs.  Has sinus rhythm with first-degree AV block to 2:1 AV block heart rates in the 30s.- Personally Reviewed  ECG    EKG shows normal rhythm with LVH and repolarization changes and appears there are P waves in the ST segments in the lateral leads making them look more abnormal.- Personally Reviewed  Physical Exam   General appearance: alert, cooperative, appears stated age, no distress, and resting comfortably. Neck: no adenopathy, no carotid bruit, and no JVD Lungs: clear to auscultation bilaterally and normal percussion bilaterally Heart: RRR with normal S1-S2.  2-3/6 SEM at RUSB-neck. Abdomen: soft, non-tender; bowel sounds normal; no masses,  no organomegaly Extremities: extremities normal, atraumatic, no cyanosis or edema Pulses: 2+ and symmetric Neurologic: Grossly normal  Cardiac Studies   TTE 02/10/2022: EF 55-60 %.  Mild hypokinesis of the entire inferior wall as well as basal inferolateral wall.  (New).  Normal RV size and function.  Moderate LA dilation.  Mild to moderate MR.  Moderate AOV calcification with mild to moderate stenosis (mean AOV gradient 23 mmHg)  Labs    High Sensitivity Troponin:   Recent Labs  Lab 02/10/22 0943 02/10/22 1132 02/10/22 1607 02/10/22 1745 02/10/22 2039  TROPONINIHS 390* 549* 2,109* 2,800* 2,703*     Chemistry Recent Labs  Lab 02/07/22 1154 02/10/22 0943 02/10/22 1607 02/11/22 0349  NA 129* 128*  --  133*  K 5.5* 5.0  --  4.8  CL 97 98  --  103  CO2 20 19*  --  21*  GLUCOSE 130* 314*  --  85  BUN 36* 39*  --  42*  CREATININE 2.23* 2.54*  --  2.62*  CALCIUM 9.9 9.0  --  8.9  MG  --   --  2.1 2.0  PROT  --   --   --  5.7*  ALBUMIN  --   --   --  3.0*  AST  --   --   --  33  ALT  --   --   --  21  ALKPHOS  --   --   --  76  BILITOT  --   --   --  0.6  GFRNONAA  --  24*  --  24*  ANIONGAP  --  11  --  9    Lipids  Recent  Labs  Lab 02/10/22 1607  CHOL 102  TRIG 21  HDL 39*  LDLCALC 59  CHOLHDL 2.6    Hematology Recent Labs  Lab 02/07/22 1154 02/10/22 0943 02/10/22 1607 02/11/22 0349  WBC 8.7 7.2  --  7.1  RBC 3.35* 3.27* 3.21* 2.91*  HGB 9.6* 9.3*  --  8.5*  HCT 29.1* 28.4*  --  24.7*  MCV 87 86.9  --  84.9  MCH 28.7 28.4  --  29.2  MCHC 33.0 32.7  --  34.4  RDW 14.0 14.5  --  14.6  PLT 262 256  --  240   Thyroid No results for input(s): "TSH", "FREET4" in the last 168 hours.  BNP Recent Labs  Lab 02/10/22 1607  BNP 845.7*    DDimer No results for input(s): "DDIMER" in the last 168 hours.   Radiology    ECHOCARDIOGRAM COMPLETE  Result Date: 02/10/2022    ECHOCARDIOGRAM REPORT   Patient Name:   Troy [name] Date of Exam: 02/10/2022 Medical Rec #:  [id number]      Height:       71.0 in Accession #:    [id number]     Weight:       168.4 lb Date of Birth:  09/06/1935      BSA:          1.960 m Patient Age:    66 years       BP:           138/46 mmHg Patient Gender: M              HR:           47 bpm. Exam Location:  Inpatient Procedure: 2D Echo, Cardiac Doppler, Color Doppler and 3D Echo                       STAT ECHO Reported to: Dr. Dia Crawford on 02/10/2022 3:00:00 PM. Indications:    NSTEMI I21.4  History:        Patient has prior history of Echocardiogram examinations, most                 recent 10/02/2020. CAD; Risk Factors:Dyslipidemia.  Sonographer:    Bernadene Person RDCS Referring Phys: Geraldo Docker WOODS IMPRESSIONS  1. Left ventricular ejection fraction, by estimation, is 55 to 60%. The left ventricle has normal function. The left ventricle demonstrates regional wall motion abnormalities (see scoring diagram/findings for description). Left ventricular diastolic parameters are indeterminate. There is mild hypokinesis of the left ventricular, entire inferior wall. There is mild hypokinesis of the left ventricular, basal inferolateral wall.  2. Right ventricular systolic function is normal. The  right ventricular size is normal. There is normal pulmonary artery systolic pressure.  3. Left atrial size was moderately dilated.  4. The mitral valve is normal in structure. Mild to moderate mitral valve regurgitation. No evidence of mitral stenosis.  5. The aortic valve is calcified. There is moderate calcification of the aortic valve. There is moderate thickening of the aortic valve. Aortic valve regurgitation is not visualized. Moderate aortic valve  stenosis.  6. The inferior vena cava is normal in size with greater than 50% respiratory variability, suggesting right atrial pressure of 3 mmHg. Comparison(s): Prior images reviewed side by side. Changes from prior study are noted. Overall similar LVEF, but current study with mild hypokinesis of the inferior/inferolateral wall as noted. Conclusion(s)/Recommendation(s): New mild wall motion abnormalities with preserved EF. Moderate aortic stenosis. Sinus bradycardia (as low as 37 bpm) noted throughout study. FINDINGS  Left Ventricle: Left ventricular ejection fraction, by estimation, is 55 to 60%. The left ventricle has normal function. The left ventricle demonstrates regional wall motion abnormalities. Mild hypokinesis of the left ventricular, entire inferior wall. Mild hypokinesis of the left ventricular, basal inferolateral wall. The left ventricular internal cavity size was normal in size. There is no left ventricular hypertrophy. Left ventricular diastolic parameters are indeterminate. Right Ventricle: The right ventricular size is normal. Right vetricular wall thickness was not well visualized. Right ventricular systolic function is normal. There is normal pulmonary artery systolic pressure. The tricuspid regurgitant velocity is 2.75 m/s, and with an assumed right atrial pressure of 3 mmHg, the estimated right ventricular systolic pressure is 56.4 mmHg. Left Atrium: Left atrial size was moderately dilated. Right Atrium: Right atrial size was normal in size.  Pericardium: There is no evidence of pericardial effusion. Mitral Valve: The mitral valve is normal in structure. There is moderate thickening of the mitral valve leaflet(s). There is moderate calcification of the mitral valve leaflet(s). Mild to moderate mitral valve regurgitation. No evidence of mitral valve stenosis. Tricuspid Valve: The tricuspid valve is normal in structure. Tricuspid valve regurgitation is mild . No evidence of tricuspid stenosis. Aortic Valve: The aortic valve is calcified. There is moderate calcification of the aortic valve. There is moderate thickening of the aortic valve. Aortic valve regurgitation is not visualized. Moderate aortic stenosis is present. Aortic valve mean gradient measures 23.3 mmHg. Aortic valve peak gradient measures 40.6 mmHg. Aortic valve area, by VTI measures 0.99 cm. Pulmonic Valve: The pulmonic valve was not well visualized. Pulmonic valve regurgitation is not visualized. No evidence of pulmonic stenosis. Aorta: The aortic root and ascending aorta are structurally normal, with no evidence of dilitation. Venous: The inferior vena cava is normal in size with greater than 50% respiratory variability, suggesting right atrial pressure of 3 mmHg. IAS/Shunts: The interatrial septum was not well visualized.  LEFT VENTRICLE PLAX 2D LVIDd:         4.80 cm      Diastology LVIDs:         3.40 cm      LV e' medial:    9.81 cm/s LV PW:         1.10 cm      LV E/e' medial:  13.0 LV IVS:        1.10 cm      LV e' lateral:   10.20 cm/s LVOT diam:     2.10 cm      LV E/e' lateral: 12.5 LV SV:         87 LV SV Index:   44 LVOT Area:     3.46 cm                              3D Volume EF: LV Volumes (MOD)            3D EF:        50 % LV vol d, MOD A2C: 120.0 ml  LV EDV:       178 ml LV vol d, MOD A4C: 121.0 ml LV ESV:       89 ml LV vol s, MOD A2C: 46.0 ml  LV SV:        89 ml LV vol s, MOD A4C: 51.0 ml LV SV MOD A2C:     74.0 ml LV SV MOD A4C:     121.0 ml LV SV MOD BP:      75.4 ml  RIGHT VENTRICLE RV S prime:     13.10 cm/s TAPSE (M-mode): 2.3 cm LEFT ATRIUM             Index        RIGHT ATRIUM           Index LA diam:        4.80 cm 2.45 cm/m   RA Area:     14.40 cm LA Vol (A2C):   69.4 ml 35.41 ml/m  RA Volume:   32.90 ml  16.78 ml/m LA Vol (A4C):   74.9 ml 38.21 ml/m LA Biplane Vol: 75.4 ml 38.47 ml/m  AORTIC VALVE AV Area (Vmax):    1.10 cm AV Area (Vmean):   0.88 cm AV Area (VTI):     0.99 cm AV Vmax:           318.67 cm/s AV Vmean:          228.333 cm/s AV VTI:            0.878 m AV Peak Grad:      40.6 mmHg AV Mean Grad:      23.3 mmHg LVOT Vmax:         101.00 cm/s LVOT Vmean:        58.100 cm/s LVOT VTI:          0.250 m LVOT/AV VTI ratio: 0.28  AORTA Ao Root diam: 3.30 cm Ao Asc diam:  3.50 cm MITRAL VALVE                  TRICUSPID VALVE MV Area (PHT): 5.13 cm       TR Peak grad:   30.2 mmHg MV Decel Time: 148 msec       TR Vmax:        275.00 cm/s MR Peak grad:    141.1 mmHg MR Mean grad:    97.0 mmHg    SHUNTS MR Vmax:         594.00 cm/s  Systemic VTI:  0.25 m MR Vmean:        471.0 cm/s   Systemic Diam: 2.10 cm MR PISA:         1.01 cm MR PISA Eff ROA: 7 mm MR PISA Radius:  0.40 cm MV E velocity: 128.00 cm/s MV A velocity: 79.10 cm/s MV E/A ratio:  1.62 Buford Dresser MD Electronically signed by Buford Dresser MD Signature Date/Time: 02/10/2022/3:01:01 PM    Final    DG Chest 1 View  Result Date: 02/10/2022 CLINICAL DATA:  86 year old male with chest pain. Feel similar to prior MI. EXAM: CHEST  1 VIEW COMPARISON:  Chest radiographs 10/01/2020 and earlier. FINDINGS: PA view at 0946 hours. Stable lung volumes, hyperinflated. Stable mild eventration of the diaphragm, normal variant. Mediastinal contours are stable and within normal limits. Visualized tracheal air column is within normal limits. Mild EKG button artifact in both upper lungs. No pneumothorax, pulmonary edema, pleural effusion or confluent pulmonary opacity. No acute osseous abnormality  identified. Negative visible bowel gas. IMPRESSION: Chronic pulmonary hyperinflation. No acute cardiopulmonary abnormality. Electronically Signed   By: Genevie [name] M.D.   On: 02/10/2022 09:57         For questions or updates, please contact [place] Please consult [email or web address] for contact info under        Signed, Glenetta Hew, MD  02/11/2022, 3:00 PM

## 2022-02-11 NOTE — Assessment & Plan Note (Signed)
Unfortunately, her renal function has not stabilized, creatinine has gone up to 2.62 from 2.23 back on the 14th.  Would like to see at least stabilize prior to considering cardiac catheterization.  As such we will hold off on catheterization today.  Thankfully, his sodium levels and potassium have improved.  With mild worsening of renal function, will hold off on catheterization until this is stabilized.

## 2022-02-11 NOTE — Progress Notes (Addendum)
Progress Note  Patient Name: Troy [name] Date of Encounter: 02/11/2022  [facility] Cardiologist: Shirlee More, MD    Patient Profile     86 y.o. male with a hx of CHF, HTN, CKD stage 4, CAD with prior PCI in 2001, aortic stenosis, second degree AV block, Mobitz 1 and 2-1 block, and PAD with prior stenting Right common iliac artery in 2020 => consulted on 02/10/2022 for the evaluation of chest pain/ACS at the request of Dr. Sherral Hammers.  => Subsequently ruled in for non-ST elevation MI with troponin of greater than 2700.  Assessment & Plan    Principal Problem:   NSTEMI (non-ST elevated myocardial infarction) ([place]) Active Problems:   Coronary artery disease involving native coronary artery of native heart with unstable angina pectoris (HCC)   HFrEF (heart failure with reduced ejection fraction) (HCC) => combination hypertensive heart disease and CAD related   Hyperlipidemia associated with type 2 diabetes mellitus (HCC)   Bradycardia   Type 2 diabetes mellitus with stage 4 chronic kidney disease, with long-term current use of insulin (HCC)   Anemia   CKD (chronic kidney disease) stage 4, GFR 15-29 ml/min [facility])  Hospital Problem List as of 02/11/2022          Priority Resolved POA     1.     * (Principal) NSTEMI (non-ST elevated myocardial infarction) ([place]) 1.  Yes    Current Assessment & Plan 02/10/2022 Hospital Encounter Written 02/11/2022 12:07 PM by Leonie Man, MD     Clearly has had progressive angina symptoms over the last several weeks now presenting with another episode of chest discomfort and troponin elevation to 2700.  I suspect he may very well have pretty significant disease minutes multiple vessel distribution.  At this point we are waiting to stabilize out his renal function in order to consider cardiac catheterization. Currently chest pain-free on IV heparin and IV nitroglycerin. No beta-blocker because of bradycardia No ACE inhibitor/ARB due to renal  insufficiency We will reinitiate clopidogrel and stop clonidine High-dose statin         Coronary artery disease involving native coronary artery of native heart with unstable angina pectoris ([place]) 1.  Yes    Overview Signed 03/09/2017  1:41 PM by Stevan Born, CMA     Overview:  PCI/ stent 2001       Current Assessment & Plan 02/10/2022 Hospital Encounter Written 02/11/2022 12:09 PM by Leonie Man, MD     Distant history of PCI back in 2001.  I suspect that he has more significant disease now.  Inferior inferolateral hypokinesis on echo which suggest likely RCA distribution disease, but could very well have multivessel disease. Now with progressive anginal symptoms leading to ACS presentation and elevated troponins, we would like to proceed with cardiac catheterization pending improvement in renal function.  Plan will be stabilization with IV heparin and nitroglycerin until renal function improves.  Cardiac catheterization once stable renal function-this will be a staged endeavor in order to diagnostic catheterization and then allow for renal recovery before PCI. In the duration between diagnostic catheterization and PCI, would allow for EP to potentially consider pacemaker placement if indicated.  Continue aspirin, high-dose atorvastatin No beta-blocker because of bradycardia-have DC clonidine for similar reason and will reinitiate amlodipine 5 mg. Again no ARB/ACE inhibitor due to renal insufficiency. On aspirin, but not Thienopyridine yet pending cardiac cath.        2.     HFrEF (heart failure with  reduced ejection fraction) (HCC) => combination hypertensive heart disease and CAD related 2.  Yes    Current Assessment & Plan 02/10/2022 Hospital Encounter Written 02/11/2022 12:05 PM by Leonie Man, MD     Most likely HFpEF with elevated BNP, but does not seem to be volume overloaded.  Echocardiogram did not suggest significant DHF.  May consider right and left heart cath if  and when we proceed with cardiac catheterization.        3.     Hyperlipidemia associated with type 2 diabetes mellitus ([place]) 3.  Yes    Current Assessment & Plan 02/10/2022 Hospital Encounter Written 02/11/2022 12:03 PM by Leonie Man, MD     LDL 59, but with ACS presentation, started on atorvastatin 80 mg daily.        Bradycardia 3.  Yes    Current Assessment & Plan 02/10/2022 Hospital Encounter Written 02/11/2022 12:10 PM by Leonie Man, MD     Complicated bradycardia, will defer description to the EP service has been consulted today.  He has clear evidence of first AV block and Wenckebach block, but is also having episodes of pretty slow heart rates with 2: 1 AV block.  I do not see any pauses or 3  AV block at this point, but with the extent of bradycardia we are certainly not able to use beta-blocker or AV any AV nodal agents.  With him having CAD beta-blockers would be indicated and this would potentially drive the indication for possible pacemaker.  Appreciate EP team consultation today.         Type 2 diabetes mellitus with stage 4 chronic kidney disease, with long-term current use of insulin ([place]) 3.  Not Applicable    Current Assessment & Plan 02/10/2022 Hospital Encounter Edited 02/11/2022 12:04 PM by Leonie Man, MD     Diabetes managed by primary team. Currently on sliding scale insulin.        Unprioritized     Anemia   Yes    Current Assessment & Plan 02/10/2022 Hospital Encounter Edited 02/11/2022  2:43 PM by Leonie Man, MD     Unclear true etiology, suspect this may be partly related to blood draws and being on IV heparin, no clear signs of bleeding.  Low threshold to consider evaluation with CT scan,  With ongoing symptoms and with worsening renal function, may benefit from transfusion of 1 unit.  We will check a anemia panel first and transfuse 1 unit.        CKD (chronic kidney disease) stage 4, GFR 15-29 ml/min [facility])   Yes    Current Assessment &  Plan 02/10/2022 Hospital Encounter Written 02/11/2022 12:01 PM by Leonie Man, MD     Unfortunately, her renal function has not stabilized, creatinine has gone up to 2.62 from 2.23 back on the 14th.  Would like to see at least stabilize prior to considering cardiac catheterization.  As such we will hold off on catheterization today.  Thankfully, his sodium levels and potassium have improved.  With mild worsening of renal function, will hold off on catheterization until this is stabilized.          Subjective   Resting comfortably.  No significant chest pain or pressure. Stable overnight. Much better.  Inpatient Medications    Scheduled Meds:  amLODipine  5 mg Oral Daily   atorvastatin  80 mg Oral QHS   clopidogrel  75 mg Oral Daily   insulin aspart  0-9 Units Subcutaneous Q4H   insulin glargine-yfgn  10 Units Subcutaneous Daily   lipase/protease/amylase  24,000 Units Oral TID AC   sodium chloride flush  3 mL Intravenous Q12H  Atorvastatin 80 mg ordered along with starting amlodipine today.  Continuous Infusions:  sodium chloride     heparin 1,100 Units/hr (02/11/22 0841)   nitroGLYCERIN Stopped (02/11/22 0728)   PRN Meds: sodium chloride, acetaminophen, nitroGLYCERIN, ondansetron (ZOFRAN) IV, sodium chloride flush   Vital Signs    Vitals:   02/10/22 2021 02/11/22 0011 02/11/22 0350 02/11/22 0738  BP: (!) 180/51 (!) 153/73 117/64 (!) 153/69  Pulse: 75 67 70 73  Resp:    19  Temp: 97.6 F (36.4 C) 97.9 F (36.6 C) 97.6 F (36.4 C) 98.5 F (36.9 C)  TempSrc: Oral Oral Oral Oral  SpO2: 97% 98% 97% 97%  Weight:   73.9 kg   Height:        Intake/Output Summary (Last 24 hours) at 02/11/2022 1500 Last data filed at 02/11/2022 1342 Gross per 24 hour  Intake 645.83 ml  Output 1100 ml  Net -454.17 ml      02/11/2022    3:50 AM 02/10/2022    6:21 PM 02/10/2022   12:11 PM  Last 3 Weights  Weight (lbs) 163 lb 141 lb 8.6 oz 168 lb 6.9 oz  Weight (kg) 73.936 kg 64.2  kg 76.4 kg      Telemetry    Wide range of heart rate from the 30s to 90s with narrow complex QRSs.  Has sinus rhythm with first-degree AV block to 2:1 AV block heart rates in the 30s.- Personally Reviewed  ECG    EKG shows normal rhythm with LVH and repolarization changes and appears there are P waves in the ST segments in the lateral leads making them look more abnormal.- Personally Reviewed  Physical Exam   General appearance: alert, cooperative, appears stated age, no distress, and resting comfortably. Neck: no adenopathy, no carotid bruit, and no JVD Lungs: clear to auscultation bilaterally and normal percussion bilaterally Heart: RRR with normal S1-S2.  2-3/6 SEM at RUSB-neck. Abdomen: soft, non-tender; bowel sounds normal; no masses,  no organomegaly Extremities: extremities normal, atraumatic, no cyanosis or edema Pulses: 2+ and symmetric Neurologic: Grossly normal  Cardiac Studies   TTE 02/10/2022: EF 55-60 %.  Mild hypokinesis of the entire inferior wall as well as basal inferolateral wall.  (New).  Normal RV size and function.  Moderate LA dilation.  Mild to moderate MR.  Moderate AOV calcification with mild to moderate stenosis (mean AOV gradient 23 mmHg)  Labs    High Sensitivity Troponin:   Recent Labs  Lab 02/10/22 0943 02/10/22 1132 02/10/22 1607 02/10/22 1745 02/10/22 2039  TROPONINIHS 390* 549* 2,109* 2,800* 2,703*     Chemistry Recent Labs  Lab 02/07/22 1154 02/10/22 0943 02/10/22 1607 02/11/22 0349  NA 129* 128*  --  133*  K 5.5* 5.0  --  4.8  CL 97 98  --  103  CO2 20 19*  --  21*  GLUCOSE 130* 314*  --  85  BUN 36* 39*  --  42*  CREATININE 2.23* 2.54*  --  2.62*  CALCIUM 9.9 9.0  --  8.9  MG  --   --  2.1 2.0  PROT  --   --   --  5.7*  ALBUMIN  --   --   --  3.0*  AST  --   --   --  33  ALT  --   --   --  21  ALKPHOS  --   --   --  76  BILITOT  --   --   --  0.6  GFRNONAA  --  24*  --  24*  ANIONGAP  --  11  --  9    Lipids  Recent  Labs  Lab 02/10/22 1607  CHOL 102  TRIG 21  HDL 39*  LDLCALC 59  CHOLHDL 2.6    Hematology Recent Labs  Lab 02/07/22 1154 02/10/22 0943 02/10/22 1607 02/11/22 0349  WBC 8.7 7.2  --  7.1  RBC 3.35* 3.27* 3.21* 2.91*  HGB 9.6* 9.3*  --  8.5*  HCT 29.1* 28.4*  --  24.7*  MCV 87 86.9  --  84.9  MCH 28.7 28.4  --  29.2  MCHC 33.0 32.7  --  34.4  RDW 14.0 14.5  --  14.6  PLT 262 256  --  240   Thyroid No results for input(s): "TSH", "FREET4" in the last 168 hours.  BNP Recent Labs  Lab 02/10/22 1607  BNP 845.7*    DDimer No results for input(s): "DDIMER" in the last 168 hours.   Radiology    ECHOCARDIOGRAM COMPLETE  Result Date: 02/10/2022    ECHOCARDIOGRAM REPORT   Patient Name:   RAYSEAN GRAUMANN Date of Exam: 02/10/2022 Medical Rec #:  [id number]      Height:       71.0 in Accession #:    [id number]     Weight:       168.4 lb Date of Birth:  1936/07/07      BSA:          1.960 m Patient Age:    43 years       BP:           138/46 mmHg Patient Gender: M              HR:           47 bpm. Exam Location:  Inpatient Procedure: 2D Echo, Cardiac Doppler, Color Doppler and 3D Echo                       STAT ECHO Reported to: Dr. Dia Crawford on 02/10/2022 3:00:00 PM. Indications:    NSTEMI I21.4  History:        Patient has prior history of Echocardiogram examinations, most                 recent 10/02/2020. CAD; Risk Factors:Dyslipidemia.  Sonographer:    Bernadene Person RDCS Referring Phys: Geraldo Docker [facility] IMPRESSIONS  1. Left ventricular ejection fraction, by estimation, is 55 to 60%. The left ventricle has normal function. The left ventricle demonstrates regional wall motion abnormalities (see scoring diagram/findings for description). Left ventricular diastolic parameters are indeterminate. There is mild hypokinesis of the left ventricular, entire inferior wall. There is mild hypokinesis of the left ventricular, basal inferolateral wall.  2. Right ventricular systolic function is normal. The  right ventricular size is normal. There is normal pulmonary artery systolic pressure.  3. Left atrial size was moderately dilated.  4. The mitral valve is normal in structure. Mild to moderate mitral valve regurgitation. No evidence of mitral stenosis.  5. The aortic valve is calcified. There is moderate calcification of the aortic valve. There is moderate thickening of the aortic valve. Aortic valve regurgitation is not visualized. Moderate aortic valve  stenosis.  6. The inferior vena cava is normal in size with greater than 50% respiratory variability, suggesting right atrial pressure of 3 mmHg. Comparison(s): Prior images reviewed side by side. Changes from prior study are noted. Overall similar LVEF, but current study with mild hypokinesis of the inferior/inferolateral wall as noted. Conclusion(s)/Recommendation(s): New mild wall motion abnormalities with preserved EF. Moderate aortic stenosis. Sinus bradycardia (as low as 37 bpm) noted throughout study. FINDINGS  Left Ventricle: Left ventricular ejection fraction, by estimation, is 55 to 60%. The left ventricle has normal function. The left ventricle demonstrates regional wall motion abnormalities. Mild hypokinesis of the left ventricular, entire inferior wall. Mild hypokinesis of the left ventricular, basal inferolateral wall. The left ventricular internal cavity size was normal in size. There is no left ventricular hypertrophy. Left ventricular diastolic parameters are indeterminate. Right Ventricle: The right ventricular size is normal. Right vetricular wall thickness was not well visualized. Right ventricular systolic function is normal. There is normal pulmonary artery systolic pressure. The tricuspid regurgitant velocity is 2.75 m/s, and with an assumed right atrial pressure of 3 mmHg, the estimated right ventricular systolic pressure is 41.3 mmHg. Left Atrium: Left atrial size was moderately dilated. Right Atrium: Right atrial size was normal in size.  Pericardium: There is no evidence of pericardial effusion. Mitral Valve: The mitral valve is normal in structure. There is moderate thickening of the mitral valve leaflet(s). There is moderate calcification of the mitral valve leaflet(s). Mild to moderate mitral valve regurgitation. No evidence of mitral valve stenosis. Tricuspid Valve: The tricuspid valve is normal in structure. Tricuspid valve regurgitation is mild . No evidence of tricuspid stenosis. Aortic Valve: The aortic valve is calcified. There is moderate calcification of the aortic valve. There is moderate thickening of the aortic valve. Aortic valve regurgitation is not visualized. Moderate aortic stenosis is present. Aortic valve mean gradient measures 23.3 mmHg. Aortic valve peak gradient measures 40.6 mmHg. Aortic valve area, by VTI measures 0.99 cm. Pulmonic Valve: The pulmonic valve was not well visualized. Pulmonic valve regurgitation is not visualized. No evidence of pulmonic stenosis. Aorta: The aortic root and ascending aorta are structurally normal, with no evidence of dilitation. Venous: The inferior vena cava is normal in size with greater than 50% respiratory variability, suggesting right atrial pressure of 3 mmHg. IAS/Shunts: The interatrial septum was not well visualized.  LEFT VENTRICLE PLAX 2D LVIDd:         4.80 cm      Diastology LVIDs:         3.40 cm      LV e' medial:    9.81 cm/s LV PW:         1.10 cm      LV E/e' medial:  13.0 LV IVS:        1.10 cm      LV e' lateral:   10.20 cm/s LVOT diam:     2.10 cm      LV E/e' lateral: 12.5 LV SV:         87 LV SV Index:   44 LVOT Area:     3.46 cm                              3D Volume EF: LV Volumes (MOD)            3D EF:        50 % LV vol d, MOD A2C: 120.0 ml  LV EDV:       178 ml LV vol d, MOD A4C: 121.0 ml LV ESV:       89 ml LV vol s, MOD A2C: 46.0 ml  LV SV:        89 ml LV vol s, MOD A4C: 51.0 ml LV SV MOD A2C:     74.0 ml LV SV MOD A4C:     121.0 ml LV SV MOD BP:      75.4 ml  RIGHT VENTRICLE RV S prime:     13.10 cm/s TAPSE (M-mode): 2.3 cm LEFT ATRIUM             Index        RIGHT ATRIUM           Index LA diam:        4.80 cm 2.45 cm/m   RA Area:     14.40 cm LA Vol (A2C):   69.4 ml 35.41 ml/m  RA Volume:   32.90 ml  16.78 ml/m LA Vol (A4C):   74.9 ml 38.21 ml/m LA Biplane Vol: 75.4 ml 38.47 ml/m  AORTIC VALVE AV Area (Vmax):    1.10 cm AV Area (Vmean):   0.88 cm AV Area (VTI):     0.99 cm AV Vmax:           318.67 cm/s AV Vmean:          228.333 cm/s AV VTI:            0.878 m AV Peak Grad:      40.6 mmHg AV Mean Grad:      23.3 mmHg LVOT Vmax:         101.00 cm/s LVOT Vmean:        58.100 cm/s LVOT VTI:          0.250 m LVOT/AV VTI ratio: 0.28  AORTA Ao Root diam: 3.30 cm Ao Asc diam:  3.50 cm MITRAL VALVE                  TRICUSPID VALVE MV Area (PHT): 5.13 cm       TR Peak grad:   30.2 mmHg MV Decel Time: 148 msec       TR Vmax:        275.00 cm/s MR Peak grad:    141.1 mmHg MR Mean grad:    97.0 mmHg    SHUNTS MR Vmax:         594.00 cm/s  Systemic VTI:  0.25 m MR Vmean:        471.0 cm/s   Systemic Diam: 2.10 cm MR PISA:         1.01 cm MR PISA Eff ROA: 7 mm MR PISA Radius:  0.40 cm MV E velocity: 128.00 cm/s MV A velocity: 79.10 cm/s MV E/A ratio:  1.62 Buford Dresser MD Electronically signed by Buford Dresser MD Signature Date/Time: 02/10/2022/3:01:01 PM    Final    DG Chest 1 View  Result Date: 02/10/2022 CLINICAL DATA:  86 year old male with chest pain. Feel similar to prior MI. EXAM: CHEST  1 VIEW COMPARISON:  Chest radiographs 10/01/2020 and earlier. FINDINGS: PA view at 0946 hours. Stable lung volumes, hyperinflated. Stable mild eventration of the diaphragm, normal variant. Mediastinal contours are stable and within normal limits. Visualized tracheal air column is within normal limits. Mild EKG button artifact in both upper lungs. No pneumothorax, pulmonary edema, pleural effusion or confluent pulmonary opacity. No acute osseous abnormality  identified. Negative visible bowel gas. IMPRESSION: Chronic pulmonary hyperinflation. No acute cardiopulmonary abnormality. Electronically Signed   By: Genevie [name] M.D.   On: 02/10/2022 09:57         For questions or updates, please contact [place] Please consult [email or web address] for contact info under        Signed, Glenetta Hew, MD  02/11/2022, 3:00 PM

## 2022-02-11 NOTE — Assessment & Plan Note (Signed)
Most likely HFpEF with elevated BNP, but does not seem to be volume overloaded.  Echocardiogram did not suggest significant DHF.  May consider right and left heart cath if and when we proceed with cardiac catheterization.

## 2022-02-11 NOTE — Assessment & Plan Note (Deleted)
Most likely HFpEF with elevated BNP, but does not seem to be volume overloaded.  Echocardiogram did not suggest significant DHF.  May consider right and left heart cath if and when we proceed with cardiac catheterization.

## 2022-02-11 NOTE — Assessment & Plan Note (Signed)
Clearly has had progressive angina symptoms over the last several weeks now presenting with another episode of chest discomfort and troponin elevation to 2700.  I suspect he may very well have pretty significant disease minutes multiple vessel distribution.  At this point we are waiting to stabilize out his renal function in order to consider cardiac catheterization.  Currently chest pain-free on IV heparin and IV nitroglycerin.  No beta-blocker because of bradycardia  No ACE inhibitor/ARB due to renal insufficiency  We will reinitiate clopidogrel and stop clonidine  High-dose statin

## 2022-02-11 NOTE — Assessment & Plan Note (Signed)
Complicated bradycardia, will defer description to the EP service has been consulted today.  He has clear evidence of first AV block and Wenckebach block, but is also having episodes of pretty slow heart rates with 2: 1 AV block.  I do not see any pauses or 3  AV block at this point, but with the extent of bradycardia we are certainly not able to use beta-blocker or AV any AV nodal agents.  With him having CAD beta-blockers would be indicated and this would potentially drive the indication for possible pacemaker.  Appreciate EP team consultation today.

## 2022-02-11 NOTE — Consult Note (Addendum)
Cardiology Consultation:   Patient ID: Troy [name] MRN: [id number]; DOB: Apr 06, 1936  Admit date: 02/10/2022 Date of Consult: 02/11/2022  PCP:  Street, [name], MD   [facility] Providers Cardiologist:  Shirlee More, MD    Patient Profile:   Troy [name] is a 86 y.o. male with a hx of CKD (IV), CAD (remote PCI of an unknown vessel), conduction system disease w/Mobitz one requiring reduction/stopping of his nodal blockers in the past who is being seen 02/11/2022 for the evaluation of conduction system disease at the request of Dr. Ellyn Hack.  History of Present Illness:   Troy [name] was seen as a work-in visit by Dr. Bettina Gavia 02/07/22 with escalating reports of CP some episodes requiring multiple dosed of s/l NTG. He was in Mobitz one with 2:1 AVBlock in the office with reports of home readings in the 60's for HR The pt resistant to being directly admitted at the time, very much wanting to perform baptism of 2 great grandchildren (he is a Company secretary).  He was admitted yesterday, w/NSTEMI, started on a heparin gtt, planned perhaps for pacer/cath.  LABS K+ 5.0 > 4.8 BUN/Creat 36/223 > 2.54 > 2.62 BNP 845 HS Trop 390 > 549 > 2109 > 2800 > 2703 WBC 7.1 H/H 9.6 > 9.3 > 8.5/24.7 Plts 240  Home meds reviewed, include clonidine that was continued here, low dose 0.1 BID No other nodal blocking agents  Past Medical History:  Diagnosis Date   Anemia 12/09/2015   CAD in native artery 12/09/2015   Overview:  PCI/ stent 2001   Chronic diastolic heart failure ([place]) 12/09/2015   Coronary artery disease involving native coronary artery of native heart with angina pectoris ([place]) 12/09/2015   Overview:  PCI/ stent 2001   Hyperlipidemia 12/09/2015   Hypertensive heart disease with heart failure ([place]) 12/09/2015   Necrotizing pancreatitis 06/26/2015   Pseudocyst of pancreas 06/26/2015    Past Surgical History:  Procedure Laterality Date   ABDOMINAL AORTOGRAM W/LOWER EXTREMITY Bilateral  09/10/2018   Procedure: ABDOMINAL AORTOGRAM W/LOWER EXTREMITY;  Surgeon: Elam Dutch, MD;  Location: [place] [facility];  Service: Cardiovascular;  Laterality: Bilateral;   CHOLECYSTECTOMY     PERIPHERAL VASCULAR INTERVENTION Right 09/10/2018   Procedure: PERIPHERAL VASCULAR INTERVENTION;  Surgeon: Elam Dutch, MD;  Location: [facility] [facility];  Service: Cardiovascular;  Laterality: Right;  common iliac   TONSILLECTOMY AND ADENOIDECTOMY     TOTAL KNEE REVISION Bilateral      Home Medications:  Prior to Admission medications   Medication Sig Start Date End Date Taking? Authorizing Provider  acetaminophen (TYLENOL) 500 MG tablet Take 500-1,000 mg by mouth every 6 (six) hours as needed for moderate pain.   Yes [provider]  amLODipine (NORVASC) 5 MG tablet Take 1 tablet (5 mg total) by mouth in the morning and at bedtime. 01/29/22  Yes Richardo Priest, MD  atorvastatin (LIPITOR) 80 MG tablet TAKE 1 TABLET EACH EVENING Patient taking differently: Take 80 mg by mouth daily. 08/29/19  Yes Richardo Priest, MD  B Complex Vitamins (VITAMIN B COMPLEX) TABS Take 1 tablet by mouth daily.   Yes [provider]  Cholecalciferol (VITAMIN D3) 50 MCG (2000 UT) TABS Take 4,000 Units by mouth daily.   Yes [provider]  Cinnamon 500 MG capsule Take 500 mg by mouth daily.   Yes [provider]  cloNIDine (CATAPRES) 0.1 MG tablet Take 1 tablet (0.1 mg total) by mouth 2 (two) times  daily. 01/30/21  Yes Richardo Priest, MD  clopidogrel (PLAVIX) 75 MG tablet Take 75 mg by mouth daily.   Yes [provider]  co-enzyme Q-10 30 MG capsule Take 30 mg by mouth daily.   Yes [provider]  doxazosin (CARDURA) 2 MG tablet Take 2 mg by mouth at bedtime.    Yes [provider]  hydrALAZINE (APRESOLINE) 25 MG tablet Take one tablet by mouth daily in the morning and at 12pm. Take two tablets by mouth daily at bedtime. Patient taking differently: Take  25-50 mg by mouth See admin instructions. Take one tablet ( 25 mg) by mouth daily in the morning and at 12pm. Take two tablets  ( 50 mg) by mouth daily at bedtime. 08/16/21  Yes Richardo Priest, MD  insulin glargine (LANTUS) 100 UNIT/ML injection Inject 20 Units into the skin daily.   Yes [provider]  Iron Combinations (IRON COMPLEX PO) Take 65 mg by mouth daily.   Yes [provider]  lisinopril (ZESTRIL) 20 MG tablet TAKE ONE TABLET BY MOUTH every morning Patient taking differently: Take 20 mg by mouth daily. 10/24/21  Yes Richardo Priest, MD  Multiple Vitamins-Minerals (EYE VITAMINS PO) Take 1 tablet by mouth daily.   Yes [provider]  nitroGLYCERIN (NITRO-BID) 2 % ointment Apply 1 inch topically 4 (four) times daily. 02/07/22  Yes Richardo Priest, MD  nitroGLYCERIN (NITROSTAT) 0.4 MG SL tablet Place 1 tablet (0.4 mg total) under the tongue every 5 (five) minutes as needed for chest pain. 05/16/21  Yes Richardo Priest, MD  omeprazole (PRILOSEC) 40 MG capsule Take 40 mg by mouth daily. 09/02/19  Yes [provider]  Pancrelipase, Lip-Prot-Amyl, (CREON) 24000-76000 units CPEP Take 1 capsule by mouth in the morning, at noon, and at bedtime. 12/16/19  Yes [provider]  Propylene Glycol (SYSTANE BALANCE) 0.6 % SOLN Place 1 drop into both eyes at bedtime.   Yes [provider]  vitamin C (ASCORBIC ACID) 500 MG tablet Take 500 mg by mouth daily.   Yes [provider]    Inpatient Medications: Scheduled Meds:  cloNIDine  0.1 mg Oral BID   clopidogrel  75 mg Oral Daily   insulin aspart  0-9 Units Subcutaneous Q4H   lipase/protease/amylase  24,000 Units Oral TID AC   sodium chloride flush  3 mL Intravenous Q12H   Continuous Infusions:  sodium chloride     sodium chloride 50 mL/hr at 02/11/22 1012   heparin 1,100 Units/hr (02/11/22 0841)   nitroGLYCERIN Stopped (02/11/22 0728)   PRN Meds: sodium chloride, acetaminophen,  nitroGLYCERIN, ondansetron (ZOFRAN) IV, sodium chloride flush  Allergies:   No Known Allergies  Social History:   Social History   Socioeconomic History   Marital status: Married    Spouse name: Not on file   Number of children: Not on file   Years of education: Not on file   Highest education level: Not on file  Occupational History   Not on file  Tobacco Use   Smoking status: Former    Types: Cigarettes    Passive exposure: Past   Smokeless tobacco: Never  Vaping Use   Vaping Use: Never used  Substance and Sexual Activity   Alcohol use: No   Drug use: No   Sexual activity: Not on file  Other Topics Concern   Not on file  Social History Narrative   Not on file   Social Determinants of Health  Financial Resource Strain: Not on file  Food Insecurity: Not on file  Transportation Needs: Not on file  Physical Activity: Not on file  Stress: Not on file  Social Connections: Not on file  Intimate Partner Violence: Not on file    Family History:   Family History  Problem Relation Age of Onset   Heart failure Mother    Diabetes Mother    Heart failure Father      ROS:  Please see the history of present illness.  All other ROS reviewed and negative.     Physical Exam/Data:   Vitals:   02/10/22 2021 02/11/22 0011 02/11/22 0350 02/11/22 0738  BP: (!) 180/51 (!) 153/73 117/64 (!) 153/69  Pulse: 75 67 70 73  Resp:    19  Temp: 97.6 F (36.4 C) 97.9 F (36.6 C) 97.6 F (36.4 C) 98.5 F (36.9 C)  TempSrc: Oral Oral Oral Oral  SpO2: 97% 98% 97% 97%  Weight:   73.9 kg   Height:        Intake/Output Summary (Last 24 hours) at 02/11/2022 1115 Last data filed at 02/11/2022 1012 Gross per 24 hour  Intake 645.83 ml  Output 350 ml  Net 295.83 ml      02/11/2022    3:50 AM 02/10/2022    6:21 PM 02/10/2022   12:11 PM  Last 3 Weights  Weight (lbs) 163 lb 141 lb 8.6 oz 168 lb 6.9 oz  Weight (kg) 73.936 kg 64.2 kg 76.4 kg     Body mass index is 24.07 kg/m.   General:  Well nourished, well developed, in no acute distress HEENT: normal Neck: no JVD Vascular: No carotid bruits; Distal pulses 2+ bilaterally Cardiac: RRR; 2-3/6SM, no gallops or rubs Lungs:  CTA b/l, no wheezing, rhonchi or rales  Abd: soft, nontender  Ext: no edema Musculoskeletal:  No deformities Skin: warm and dry  Neuro:  CNs 2-12 intact, no focal abnormalities noted Psych:  Normal affect   EKG:  The EKG was personally reviewed and demonstrates:   02/10/22 SR 82bpm, 1st degree AVblock 270m 02/07/22 2:1 AVBlock 38bpm, QRS 968m Telemetry:  Telemetry was personally reviewed and demonstrates:  SR 60's 1:1 noted Mobitz one as well as 2:1 episodes to rates in the 30's40's (day and night, though more noted nocturnally) All narrow QRS  Relevant CV Studies:  02/10/22; TTE  1. Left ventricular ejection fraction, by estimation, is 55 to 60%. The  left ventricle has normal function. The left ventricle demonstrates  regional wall motion abnormalities (see scoring diagram/findings for  description). Left ventricular diastolic  parameters are indeterminate. There is mild hypokinesis of the left  ventricular, entire inferior wall. There is mild hypokinesis of the left  ventricular, basal inferolateral wall.   2. Right ventricular systolic function is normal. The right ventricular  size is normal. There is normal pulmonary artery systolic pressure.   3. Left atrial size was moderately dilated.   4. The mitral valve is normal in structure. Mild to moderate mitral valve  regurgitation. No evidence of mitral stenosis.   5. The aortic valve is calcified. There is moderate calcification of the  aortic valve. There is moderate thickening of the aortic valve. Aortic  valve regurgitation is not visualized. Moderate aortic valve stenosis.   6. The inferior vena cava is normal in size with greater than 50%  respiratory variability, suggesting right atrial pressure of 3 mmHg.    Comparison(s): Prior images reviewed side by side. Changes from  prior  study are noted. Overall similar LVEF, but current study with mild  hypokinesis of the inferior/inferolateral wall as noted.    Laboratory Data:  High Sensitivity Troponin:   Recent Labs  Lab 02/10/22 0943 02/10/22 1132 02/10/22 1607 02/10/22 1745 02/10/22 2039  TROPONINIHS 390* 549* 2,109* 2,800* 2,703*     Chemistry Recent Labs  Lab 02/07/22 1154 02/10/22 0943 02/10/22 1607 02/11/22 0349  NA 129* 128*  --  133*  K 5.5* 5.0  --  4.8  CL 97 98  --  103  CO2 20 19*  --  21*  GLUCOSE 130* 314*  --  85  BUN 36* 39*  --  42*  CREATININE 2.23* 2.54*  --  2.62*  CALCIUM 9.9 9.0  --  8.9  MG  --   --  2.1 2.0  GFRNONAA  --  24*  --  24*  ANIONGAP  --  11  --  9    Recent Labs  Lab 02/11/22 0349  PROT 5.7*  ALBUMIN 3.0*  AST 33  ALT 21  ALKPHOS 76  BILITOT 0.6   Lipids  Recent Labs  Lab 02/10/22 1607  CHOL 102  TRIG 21  HDL 39*  LDLCALC 59  CHOLHDL 2.6    Hematology Recent Labs  Lab 02/07/22 1154 02/10/22 0943 02/10/22 1607 02/11/22 0349  WBC 8.7 7.2  --  7.1  RBC 3.35* 3.27* 3.21* 2.91*  HGB 9.6* 9.3*  --  8.5*  HCT 29.1* 28.4*  --  24.7*  MCV 87 86.9  --  84.9  MCH 28.7 28.4  --  29.2  MCHC 33.0 32.7  --  34.4  RDW 14.0 14.5  --  14.6  PLT 262 256  --  240   Thyroid No results for input(s): "TSH", "FREET4" in the last 168 hours.  BNP Recent Labs  Lab 02/10/22 1607  BNP 845.7*    DDimer No results for input(s): "DDIMER" in the last 168 hours.   Radiology/Studies:   DG Chest 1 View Result Date: 02/10/2022 CLINICAL DATA:  86 year old male with chest pain. Feel similar to prior MI. EXAM: CHEST  1 VIEW COMPARISON:  Chest radiographs 10/01/2020 and earlier. FINDINGS: PA view at 0946 hours. Stable lung volumes, hyperinflated. Stable mild eventration of the diaphragm, normal variant. Mediastinal contours are stable and within normal limits. Visualized tracheal air column  is within normal limits. Mild EKG button artifact in both upper lungs. No pneumothorax, pulmonary edema, pleural effusion or confluent pulmonary opacity. No acute osseous abnormality identified. Negative visible bowel gas. IMPRESSION: Chronic pulmonary hyperinflation. No acute cardiopulmonary abnormality. Electronically Signed   By: Genevie [name] M.D.   On: 02/10/2022 09:57     Assessment and Plan:   Conduction system disease QRS is narrow, with hand rubbing while I am with him regains persistent 1:1 conduction 60's He reports a unclear syncopal event coming home from getting a dental implant a couple moths ago No dizzy spells or syncope otherwise  Stop clonidine Follow Await cath findings if able to get done Likely will need pacer Dr. Lovena Le will see later today  CP CAD by history Escalating CP in the last week or so to CP that was occurring with very minimal exertion He was on NTG gtt until this AM  So far this am, no ongoing CP Cath deferred today with rising Creat Continue w/attending cardiology team    Risk Assessment/Risk Scores:    For questions or updates, please contact [place]  Please consult [email or web address] for contact info under    Signed, Baldwin Jamaica, PA-C  02/11/2022 11:15 AM;t  EP attending  Patient seen and examined.  Agree with the findings as noted above.  The patient is referred for evaluation of progressive conduction system disease.  He was admitted to the hospital with chest pain and found to have a non-ST elevation MI.  He has a fairly typical story for unstable angina which progressed.  Unfortunately he has also had problems with AV Wenke block as well as more progressive 2-1 heart block despite being off of his AV nodal blocking drugs.  The patient initially denied syncope but then noted an episode of syncope while at the dentist.  He denies peripheral edema.  He does have mild shortness of breath in addition to his chest pain.  He has stage IV  chronic renal insufficiency which complicates his care.  I note a cardiac catheterization is planned.  I suspect he will need ongoing beta-blockade with his severe coronary disease.  Of note he is still having chest pain despite having controlled ventricular rates and blood pressures.  On exam he is a pleasant 86 year old man in no distress lungs are clear the cardiovascular exam reveals an irregular rhythm with normal S1 and S2 the extremities demonstrated no edema.  Telemetry demonstrates sinus rhythm with AV Wenke block as well as periods of 2-1 heart block.  Assessment and plan 1.  Progressive conduction system disease -the patient is off AV nodal blocking drugs and continues to have heart block, mostly Mobitz 1 but also 2-1 AV block.  I strongly suspect permanent pacemaker insertion will be required if nothing else to allow up titration or rather initiation of a beta-blocker. 2.  NSTEMI -the patient is presently chest pain-free but has had pain earlier today.  He is pending left heart catheterization.  If the patient needs percutaneous intervention, it might make more sense to place his pacemaker prior to any intervention.  Cristopher Peru, MD

## 2022-02-12 ENCOUNTER — Encounter (HOSPITAL_COMMUNITY)
Admission: EM | Disposition: A | Discharge status: Home / Self Care | Payer: Self-pay | Source: Home / Self Care | Attending: Internal Medicine

## 2022-02-12 DIAGNOSIS — I5020 Unspecified systolic (congestive) heart failure: Secondary | ICD-10-CM

## 2022-02-12 DIAGNOSIS — I214 Non-ST elevation (NSTEMI) myocardial infarction: Secondary | ICD-10-CM | POA: Diagnosis not present

## 2022-02-12 DIAGNOSIS — I25110 Atherosclerotic heart disease of native coronary artery with unstable angina pectoris: Secondary | ICD-10-CM

## 2022-02-12 DIAGNOSIS — R001 Bradycardia, unspecified: Secondary | ICD-10-CM | POA: Diagnosis not present

## 2022-02-12 DIAGNOSIS — I441 Atrioventricular block, second degree: Secondary | ICD-10-CM | POA: Diagnosis not present

## 2022-02-12 DIAGNOSIS — N184 Chronic kidney disease, stage 4 (severe): Secondary | ICD-10-CM | POA: Diagnosis not present

## 2022-02-12 DIAGNOSIS — I2510 Atherosclerotic heart disease of native coronary artery without angina pectoris: Secondary | ICD-10-CM | POA: Diagnosis not present

## 2022-02-12 HISTORY — PX: LEFT HEART CATH AND CORONARY ANGIOGRAPHY: CATH118249

## 2022-02-12 LAB — CBC
HCT: 30.2 % — ABNORMAL LOW (ref 39.0–52.0)
Hemoglobin: 10.3 g/dL — ABNORMAL LOW (ref 13.0–17.0)
MCH: 29.2 pg (ref 26.0–34.0)
MCHC: 34.1 g/dL (ref 30.0–36.0)
MCV: 85.6 fL (ref 80.0–100.0)
Platelets: 249 10*3/uL (ref 150–400)
RBC: 3.53 MIL/uL — ABNORMAL LOW (ref 4.22–5.81)
RDW: 14.5 % (ref 11.5–15.5)
WBC: 7.8 10*3/uL (ref 4.0–10.5)
nRBC: 0 % (ref 0.0–0.2)

## 2022-02-12 LAB — COMPREHENSIVE METABOLIC PANEL
ALT: 22 U/L (ref 0–44)
AST: 27 U/L (ref 15–41)
Albumin: 3.1 g/dL — ABNORMAL LOW (ref 3.5–5.0)
Alkaline Phosphatase: 80 U/L (ref 38–126)
Anion gap: 11 (ref 5–15)
BUN: 49 mg/dL — ABNORMAL HIGH (ref 8–23)
CO2: 20 mmol/L — ABNORMAL LOW (ref 22–32)
Calcium: 9.4 mg/dL (ref 8.9–10.3)
Chloride: 105 mmol/L (ref 98–111)
Creatinine, Ser: 2.57 mg/dL — ABNORMAL HIGH (ref 0.61–1.24)
GFR, Estimated: 24 mL/min — ABNORMAL LOW (ref >60)
Glucose, Bld: 72 mg/dL (ref 70–99)
Potassium: 4.4 mmol/L (ref 3.5–5.1)
Sodium: 136 mmol/L (ref 135–145)
Total Bilirubin: 0.6 mg/dL (ref 0.3–1.2)
Total Protein: 6.2 g/dL — ABNORMAL LOW (ref 6.5–8.1)

## 2022-02-12 LAB — BPAM RBC
Blood Product Expiration Date: 202308082359
ISSUE DATE / TIME: 202307181725
Unit Type and Rh: 7300

## 2022-02-12 LAB — GLUCOSE, CAPILLARY
Glucose-Capillary: 78 mg/dL (ref 70–99)
Glucose-Capillary: 99 mg/dL (ref 70–99)
Glucose-Capillary: 198 mg/dL — ABNORMAL HIGH (ref 70–99)
Glucose-Capillary: 74 mg/dL (ref 70–99)
Glucose-Capillary: 93 mg/dL (ref 70–99)
Glucose-Capillary: 164 mg/dL — ABNORMAL HIGH (ref 70–99)

## 2022-02-12 LAB — TYPE AND SCREEN
ABO/RH(D): B POS
Antibody Screen: NEGATIVE
Unit division: 0

## 2022-02-12 LAB — MAGNESIUM: Magnesium: 1.9 mg/dL (ref 1.7–2.4)

## 2022-02-12 LAB — PHOSPHORUS: Phosphorus: 4.8 mg/dL — ABNORMAL HIGH (ref 2.5–4.6)

## 2022-02-12 LAB — HEPARIN LEVEL (UNFRACTIONATED): Heparin Unfractionated: 0.4 IU/mL (ref 0.30–0.70)

## 2022-02-12 SURGERY — LEFT HEART CATH AND CORONARY ANGIOGRAPHY
Anesthesia: LOCAL

## 2022-02-12 MED ORDER — FENTANYL CITRATE (PF) 100 MCG/2ML IJ SOLN
INTRAMUSCULAR | Status: AC
  Filled 2022-02-12: qty 2

## 2022-02-12 MED ORDER — VERAPAMIL HCL 2.5 MG/ML IV SOLN
INTRAVENOUS | Status: AC
  Filled 2022-02-12: qty 2

## 2022-02-12 MED ORDER — HEPARIN (PORCINE) 25000 UT/250ML-% IV SOLN
1400.0000 [IU]/h | INTRAVENOUS | Status: DC
  Administered 2022-02-12: 1100 [IU]/h via INTRAVENOUS
  Administered 2022-02-13: 1400 [IU]/h via INTRAVENOUS
  Filled 2022-02-12 (×2): qty 250

## 2022-02-12 MED ORDER — HEPARIN (PORCINE) IN NACL 1000-0.9 UT/500ML-% IV SOLN
INTRAVENOUS | Status: DC | PRN
  Administered 2022-02-12 (×2): 500 mL

## 2022-02-12 MED ORDER — IOHEXOL 350 MG/ML SOLN
INTRAVENOUS | Status: DC | PRN
  Administered 2022-02-12: 35 mL

## 2022-02-12 MED ORDER — MIDAZOLAM HCL 2 MG/2ML IJ SOLN
INTRAMUSCULAR | Status: AC
  Filled 2022-02-12: qty 2

## 2022-02-12 MED ORDER — VERAPAMIL HCL 2.5 MG/ML IV SOLN
INTRAVENOUS | Status: DC | PRN
  Administered 2022-02-12: 10 mL via INTRA_ARTERIAL

## 2022-02-12 MED ORDER — HEPARIN SODIUM (PORCINE) 1000 UNIT/ML IJ SOLN
INTRAMUSCULAR | Status: AC
Start: 2022-02-12 — End: ?
  Filled 2022-02-12: qty 10

## 2022-02-12 MED ORDER — SODIUM CHLORIDE 0.9% FLUSH
3.0000 mL | INTRAVENOUS | Status: DC | PRN
Start: 2022-02-12 — End: 2022-02-14

## 2022-02-12 MED ORDER — FENTANYL CITRATE (PF) 100 MCG/2ML IJ SOLN
INTRAMUSCULAR | Status: DC | PRN
  Administered 2022-02-12: 25 ug via INTRAVENOUS

## 2022-02-12 MED ORDER — ASPIRIN 81 MG PO CHEW: 81.0000 mg | CHEWABLE_TABLET | ORAL | Status: AC

## 2022-02-12 MED ORDER — SODIUM CHLORIDE 0.9 % IV SOLN: INTRAVENOUS | Status: DC

## 2022-02-12 MED ORDER — SODIUM CHLORIDE 0.9% FLUSH: 3.0000 mL | INTRAVENOUS | Status: DC | PRN

## 2022-02-12 MED ORDER — HEPARIN SODIUM (PORCINE) 1000 UNIT/ML IJ SOLN
INTRAMUSCULAR | Status: DC | PRN
  Administered 2022-02-12: 4000 [IU] via INTRAVENOUS

## 2022-02-12 MED ORDER — LIDOCAINE HCL (PF) 1 % IJ SOLN
INTRAMUSCULAR | Status: AC
  Filled 2022-02-12: qty 30

## 2022-02-12 MED ORDER — HEPARIN (PORCINE) IN NACL 1000-0.9 UT/500ML-% IV SOLN
INTRAVENOUS | Status: AC
  Filled 2022-02-12: qty 1000

## 2022-02-12 MED ORDER — SODIUM CHLORIDE 0.9 % IV SOLN: INTRAVENOUS | Status: AC

## 2022-02-12 MED ORDER — ASPIRIN 81 MG PO TBEC
81.0000 mg | DELAYED_RELEASE_TABLET | Freq: Every day | ORAL | Status: DC
  Administered 2022-02-12 – 2022-02-15 (×3): 81 mg via ORAL
  Filled 2022-02-12 (×3): qty 1

## 2022-02-12 MED ORDER — LIDOCAINE HCL (PF) 1 % IJ SOLN
INTRAMUSCULAR | Status: DC | PRN
  Administered 2022-02-12: 2 mL

## 2022-02-12 MED ORDER — SODIUM CHLORIDE 0.9% FLUSH: 3.0000 mL | Freq: Two times a day (BID) | INTRAVENOUS | Status: DC

## 2022-02-12 MED ORDER — MIDAZOLAM HCL 2 MG/2ML IJ SOLN
INTRAMUSCULAR | Status: DC | PRN
  Administered 2022-02-12: 1 mg via INTRAVENOUS

## 2022-02-12 MED ORDER — SODIUM CHLORIDE 0.9 % IV SOLN: 250.0000 mL | INTRAVENOUS | Status: DC | PRN

## 2022-02-12 MED ORDER — SODIUM CHLORIDE 0.9 % IV SOLN
250.0000 mL | INTRAVENOUS | Status: DC | PRN
Start: 2022-02-12 — End: 2022-02-14

## 2022-02-12 SURGICAL SUPPLY — 11 items
BAND CMPR LRG ZPHR (HEMOSTASIS) ×1
BAND ZEPHYR COMPRESS 30 LONG (HEMOSTASIS) ×1 IMPLANT
CATH INFINITI 5FR JK (CATHETERS) ×1 IMPLANT
CATH INFINITI JR4 5F (CATHETERS) ×1 IMPLANT
GLIDESHEATH SLEND SS 6F .021 (SHEATH) ×1 IMPLANT
GUIDEWIRE INQWIRE 1.5J.035X260 (WIRE) IMPLANT
INQWIRE 1.5J .035X260CM (WIRE) ×2
KIT HEART LEFT (KITS) ×2 IMPLANT
PACK CARDIAC CATHETERIZATION (CUSTOM PROCEDURE TRAY) ×2 IMPLANT
TRANSDUCER W/STOPCOCK (MISCELLANEOUS) ×2 IMPLANT
TUBING CIL FLEX 10 FLL-RA (TUBING) ×2 IMPLANT

## 2022-02-12 NOTE — Care Management (Signed)
  Transition of Care [facility]) Screening Note   Patient Details  Name: Troy [name] Date of Birth: 02-24-1936   Transition of Care [facility]) CM/SW Contact:    Bethena Roys, RN Phone Number: 02/12/2022, 1:09 PM    Transition of Care Department [facility]) has reviewed the patient and no TOC needs have been identified at this time. We will continue to monitor patient advancement through interdisciplinary progression rounds. If new patient transition needs arise, please place a TOC consult.

## 2022-02-12 NOTE — Progress Notes (Signed)
ANTICOAGULATION CONSULT NOTE  Pharmacy Consult for Heparin infusion Indication: chest pain/ACS  No Known Allergies  Patient Measurements: Height: '5\' 9"'$  (175.3 cm) Weight: 76.3 kg (168 lb 3.2 oz) IBW/kg (Calculated) : Troy.7 Heparin Dosing Weight: 76.4 kg  Vital Signs: Temp: 97.9 F (36.6 C) (07/19 1438) Temp Source: Oral (07/19 1438) BP: 150/62 (07/19 1446) Pulse Rate: 80 (07/19 1446)  Labs: Recent Labs    02/10/22 0943 02/10/22 1132 02/10/22 1607 02/10/22 1745 02/10/22 2039 02/11/22 0349 02/11/22 2225 02/12/22 0304  HGB 9.3*  --   --   --   --  8.5* 9.8* 10.3*  HCT 28.4*  --   --   --   --  24.7* 27.9* 30.2*  PLT 256  --   --   --   --  240  --  249  HEPARINUNFRC  --   --   --   --  0.45 0.24*  --  0.40  CREATININE 2.54*  --   --   --   --  2.62*  --  2.57*  TROPONINIHS 390*   < > 2,109* 2,800* 2,703*  --   --   --    < > = values in this interval not displayed.     Estimated Creatinine Clearance: 21.8 mL/min (A) (by C-G formula based on SCr of 2.57 mg/dL (H)).   Assessment: 86 yo [name] admitted for chest pain x3 wks. Pain is off/on and worsens with exertion. Endorses SOB and dizziness. Patient was not taking any oral anticoagulants prior to admission. Pharmacy consulted to dose heparin infusion for chest pain/ACS protocol.  S/p cath. Plan for RCA PCI on Friday.  Heparin was stopped at 1330 for cath. Plan to restart heparin at 2030 CBC stable. Platelets 249.  Goal of Therapy:  Heparin level 0.3-0.7 units/ml Monitor platelets by anticoagulation protocol: Yes   Plan:  Start heparin 1100 units/hr Check heparin level in 8 hours Monitor daily CBC, heparin level, and s/sx bleeding  Jeneen Rinks, Pharm.D PGY1 Pharmacy Resident 02/12/2022 5:08 PM  Please check AMION for all [place] numbers 02/12/2022

## 2022-02-12 NOTE — Progress Notes (Signed)
PROGRESS NOTE    Troy [name]  MAU:[id number] DOB: 1936/01/31 DOA: 02/10/2022 PCP: Venetia Maxon, [name], MD    Brief Narrative:  Troy [name] is a 86 y.o. with prior h/o CAD, Mobitz type I (Wenckebach), HTN,, Aortic Stenosis, chronic diastolic CHF, HLD, necrotizing pancreatitis, CKD stage IV  presented with sob, chest pain and dizziness.  He was found to have non-STEMI and currently undergoing staged PCI.   Assessment & Plan:   Non-ST elevation MI: Recurrent chest pain. Currently remains on aspirin, Plavix, atorvastatin, IV heparin and IV nitroglycerin. Remains on gentle hydration. Underwent cardiac catheterization today and found to have intact left-sided coronaries. Distal RCA blockage with a ruptured plaque.  Cardiology planning for scheduled multistage PCI on 7/21.  Bradycardia with high-grade AV block: In the setting of known Mobitz type I AV block.  Followed by EP cardiology.  Currently not recommending any intervention or pacemaker.  Monitor in the hospital.  Hopefully will improve with improvement of ischemia.  CKD stage IV: At about baseline creatinine.  Continue monitoring with use of contrast.  Essential hypertension: On amlodipine and well controlled.  Chronic normocytic anemia: At about baseline.  Type 2 diabetes: Well-controlled.  Remains on insulin.  Hyperlipidemia: On statins.    DVT prophylaxis:   Heparin infusion   Code Status: Full code Family Communication: Wife at the bedside Disposition Plan: Status is: Inpatient Remains inpatient appropriate because: Inpatient procedures planned, remains on high risk infusions     Consultants:  Cardiology  Procedures:  Cardiac cath planned  Antimicrobials:  None   Subjective: Patient seen and examined.  Wife at the bedside.  Patient denies any complaints today.  He tells me that he had 1 episode of pain last night and additional nitroglycerin helped.  He was curious about hemodialysis in case he goes  on dialysis after cardiac cath.  Questions were answered. By the time this note is created, he underwent diagnostic cardiac cath as above.  Objective: Vitals:   02/12/22 1438 02/12/22 1441 02/12/22 1445 02/12/22 1446  BP: (!) 166/78 (!) 166/78 (!) 151/76 (!) 150/62  Pulse: 80 77 81 80  Resp: 18     Temp: 97.9 F (36.6 C)     TempSrc: Oral     SpO2: 97% 97% 97% 96%  Weight:      Height:        Intake/Output Summary (Last 24 hours) at 02/12/2022 1531 Last data filed at 02/12/2022 0400 Gross per 24 hour  Intake 1101.64 ml  Output 900 ml  Net 201.64 ml   Filed Weights   02/10/22 1821 02/11/22 0350 02/12/22 0415  Weight: 64.2 kg 73.9 kg 76.3 kg    Examination:  General exam: Appears calm and comfortable  Respiratory system: Clear to auscultation. Respiratory effort normal. Cardiovascular system: S1 & S2 heard, RRR. No JVD, murmurs, rubs, gallops or clicks. No pedal edema. Gastrointestinal system: Abdomen is nondistended, soft and nontender. No organomegaly or masses felt. Normal bowel sounds heard. Central nervous system: Alert and oriented. No focal neurological deficits.     Data Reviewed: I have personally reviewed following labs and imaging studies  CBC: Recent Labs  Lab 02/07/22 1154 02/10/22 0943 02/11/22 0349 02/11/22 2225 02/12/22 0304  WBC 8.7 7.2 7.1  --  7.8  HGB 9.6* 9.3* 8.5* 9.8* 10.3*  HCT 29.1* 28.4* 24.7* 27.9* 30.2*  MCV 87 86.9 84.9  --  85.6  PLT 262 256 240  --  563   Basic Metabolic Panel: Recent Labs  Lab 02/07/22 1154 02/10/22 0943 02/10/22 1607 02/11/22 0349 02/12/22 0304  NA 129* 128*  --  133* 136  K 5.5* 5.0  --  4.8 4.4  CL 97 98  --  103 105  CO2 20 19*  --  21* 20*  GLUCOSE 130* 314*  --  85 72  BUN 36* 39*  --  42* 49*  CREATININE 2.23* 2.54*  --  2.62* 2.57*  CALCIUM 9.9 9.0  --  8.9 9.4  MG  --   --  2.1 2.0 1.9  PHOS  --   --   --  4.6 4.8*   GFR: Estimated Creatinine Clearance: 21.8 mL/min (A) (by C-G formula  based on SCr of 2.57 mg/dL (H)). Liver Function Tests: Recent Labs  Lab 02/11/22 0349 02/12/22 0304  AST 33 27  ALT 21 22  ALKPHOS 76 80  BILITOT 0.6 0.6  PROT 5.7* 6.2*  ALBUMIN 3.0* 3.1*   No results for input(s): "LIPASE", "AMYLASE" in the last 168 hours. No results for input(s): "AMMONIA" in the last 168 hours. Coagulation Profile: No results for input(s): "INR", "PROTIME" in the last 168 hours. Cardiac Enzymes: No results for input(s): "CKTOTAL", "CKMB", "CKMBINDEX", "TROPONINI" in the last 168 hours. BNP (last 3 results) No results for input(s): "PROBNP" in the last 8760 hours. HbA1C: Recent Labs    02/10/22 1607  HGBA1C 6.8*   CBG: Recent Labs  Lab 02/12/22 0040 02/12/22 0413 02/12/22 0743 02/12/22 1126 02/12/22 1454  GLUCAP 74 78 99 198* 93   Lipid Profile: Recent Labs    02/10/22 1607  CHOL 102  HDL 39*  LDLCALC 59  TRIG 21  CHOLHDL 2.6   Thyroid Function Tests: No results for input(s): "TSH", "T4TOTAL", "FREET4", "T3FREE", "THYROIDAB" in the last 72 hours. Anemia Panel: Recent Labs    02/10/22 1607 02/11/22 1541 02/11/22 1603  VITAMINB12 1,039*  --  1,023*  FOLATE 32.6  --  17.8  FERRITIN 101  --  120  TIBC 267  --  276  IRON 45  --  75  RETICCTPCT 1.0 1.0  --    Sepsis Labs: No results for input(s): "PROCALCITON", "LATICACIDVEN" in the last 168 hours.  No results found for this or any previous visit (from the past 240 hour(s)).       Radiology Studies: CARDIAC CATHETERIZATION  Result Date: 02/12/2022   Mid Cx lesion is 30% stenosed.   Prox RCA lesion is 90% stenosed.   Prox RCA to Mid RCA lesion is 50% stenosed.   Mid RCA lesion is 60% stenosed.   RPDA lesion is 50% stenosed. 1.  Severe one-vessel coronary artery disease involving the right coronary artery.  Patent right coronary artery stents with moderate diffuse in-stent restenosis.  However, there is a 90% stenosis at the proximal edge of the stent with hazy appearance highly  suggestive of plaque rupture.  In addition, there is a 60% distal edge stenosis. 2.  Moderate aortic stenosis with mean gradient of 23 mmHg. 3.  Moderately difficult procedure via the right radial artery due to significant tortuosity of the innominate and right subclavian arteries. Recommendations: 35 mL of contrast was used for today's diagnostic procedure.  Gentle hydration postprocedure. Recommend staged RCA PCI on Friday.  I suspect that the whole length of the lesion within the previously placed stents will need to be treated with another drug-eluting stent. Consider femoral artery access for better support given difficulty engaging the right coronary artery from the right radial artery.  Scheduled Meds:  amLODipine  10 mg Oral Daily   aspirin EC  81 mg Oral Daily   atorvastatin  80 mg Oral QHS   clopidogrel  75 mg Oral Daily   hydrALAZINE  25 mg Oral Q8H   insulin aspart  0-9 Units Subcutaneous Q4H   insulin glargine-yfgn  10 Units Subcutaneous Daily   lipase/protease/amylase  24,000 Units Oral TID AC   sodium chloride flush  3 mL Intravenous Q12H   sodium chloride flush  3 mL Intravenous Q12H   sodium chloride flush  3 mL Intravenous Q12H   Continuous Infusions:  sodium chloride     sodium chloride     sodium chloride     heparin Stopped (02/12/22 1344)   nitroGLYCERIN Stopped (02/11/22 0728)     LOS: 1 day    Time spent: 35 minutes    Barb Merino, MD [facility] Pager [phone]

## 2022-02-12 NOTE — Progress Notes (Signed)
ANTICOAGULATION CONSULT NOTE  Pharmacy Consult for Heparin infusion Indication: chest pain/ACS  No Known Allergies  Patient Measurements: Height: '5\' 9"'$  (175.3 cm) Weight: 76.3 kg (168 lb 3.2 oz) IBW/kg (Calculated) : Troy.7 Heparin Dosing Weight: 76.4 kg  Vital Signs: Temp: 97.6 F (36.4 C) (07/19 0415) Temp Source: Oral (07/19 0415) BP: 166/73 (07/19 0415) Pulse Rate: 69 (07/19 0415)  Labs: Recent Labs    02/10/22 0943 02/10/22 1132 02/10/22 1607 02/10/22 1745 02/10/22 2039 02/11/22 0349 02/11/22 2225 02/12/22 0304  HGB 9.3*  --   --   --   --  8.5* 9.8* 10.3*  HCT 28.4*  --   --   --   --  24.7* 27.9* 30.2*  PLT 256  --   --   --   --  240  --  249  HEPARINUNFRC  --   --   --   --  0.45 0.24*  --  0.40  CREATININE 2.54*  --   --   --   --  2.62*  --  2.57*  TROPONINIHS 390*   < > 2,109* 2,800* 2,703*  --   --   --    < > = values in this interval not displayed.     Estimated Creatinine Clearance: 21.8 mL/min (A) (by C-G formula based on SCr of 2.57 mg/dL (H)).   Assessment: 86 yo Troy [name] admitted for chest pain x3 wks. Pain is off/on and worsens with exertion. Endorses SOB and dizziness. Patient was not taking any oral anticoagulants prior to admission. Pharmacy consulted to dose heparin infusion for chest pain/ACS protocol.  Heparin therapeutic at 0.40, CBC stable. Possible cath today.  Goal of Therapy:  Heparin level 0.3-0.7 units/ml Monitor platelets by anticoagulation protocol: Yes   Plan:  Continue heparin 1100 units/hr Monitor daily CBC, heparin level  Arrie Senate, PharmD, BCPS, [facility] Clinical Pharmacist [phone] Please check AMION for all [facility] Pharmacy numbers 02/12/2022

## 2022-02-12 NOTE — Progress Notes (Addendum)
Progress Note  Patient Name: Troy [name] Date of Encounter: 02/12/2022  [facility] Cardiologist: Shirlee More, MD    Patient Profile     44 male with a hx of CKD (IV), CAD (remote PCI of an unknown vessel), conduction system disease w/Mobitz one requiring reduction/stopping of his nodal blockers admitted with escalating CP, NSTEMI and intermittent 2:1 AVblock observed on telemetry  History reviewed.  Mobitz 1 and intermittent nocturnal complete heart block previously identified associated with beta-blockers and discontinued  Takes his blood pressure and heart rate 3 times a day and has noted no slow heart rates and this not withstanding the fact that he has very good days and very bad days, so specifically no association that he is recognize between slow heart rates and bad days.  Has orthostatic lightheadedness.  1 episode of syncope that occurred following a dental procedure.  Also has a tendency to walk backwards and on occasion Subjective   Feels well  Inpatient Medications    Scheduled Meds:  amLODipine  10 mg Oral Daily   aspirin EC  81 mg Oral Daily   atorvastatin  80 mg Oral QHS   clopidogrel  75 mg Oral Daily   hydrALAZINE  25 mg Oral Q8H   insulin aspart  0-9 Units Subcutaneous Q4H   insulin glargine-yfgn  10 Units Subcutaneous Daily   lipase/protease/amylase  24,000 Units Oral TID AC   sodium chloride flush  3 mL Intravenous Q12H   sodium chloride flush  3 mL Intravenous Q12H   Continuous Infusions:  sodium chloride     sodium chloride     sodium chloride     heparin 1,100 Units/hr (02/12/22 0541)   nitroGLYCERIN Stopped (02/11/22 0728)   PRN Meds: sodium chloride, sodium chloride, acetaminophen, nitroGLYCERIN, ondansetron (ZOFRAN) IV, sodium chloride flush, sodium chloride flush   Vital Signs    Vitals:   02/11/22 1748 02/11/22 1939 02/11/22 2105 02/12/22 0415  BP: (!) 157/74 (!) 155/94 (!) 161/65 (!) 166/73  Pulse: 70 (!) 48 72 69  Resp: 20    16  Temp: 98.3 F (36.8 C) 98.5 F (36.9 C)  97.6 F (36.4 C)  TempSrc: Oral Oral  Oral  SpO2: 97% 95% 95% 98%  Weight:    76.3 kg  Height:        Intake/Output Summary (Last 24 hours) at 02/12/2022 1118 Last data filed at 02/12/2022 0400 Gross per 24 hour  Intake 1101.64 ml  Output 1650 ml  Net -548.36 ml      02/12/2022    4:15 AM 02/11/2022    3:50 AM 02/10/2022    6:21 PM  Last 3 Weights  Weight (lbs) 168 lb 3.2 oz 163 lb 141 lb 8.6 oz  Weight (kg) 76.295 kg 73.936 kg 64.2 kg      Telemetry    Unchanged, mostly SR with 1:1 conduction, 1st degree AVBlock, intermittent Mobitz one and 2:1, more so overnight though also seen while awake - Personally Reviewed  ECG    No new EKGs - Personally Reviewed  Physical Exam   GEN: No acute distress.   Neck: No JVD Cardiac: RRR, no murmurs, rubs, or gallops.  Respiratory: CTA b/l. GI: Soft, nontender, non-distended  MS: No edema; No deformity. Neuro:  Nonfocal  Psych: Normal affect   Labs    High Sensitivity Troponin:   Recent Labs  Lab 02/10/22 0943 02/10/22 1132 02/10/22 1607 02/10/22 1745 02/10/22 2039  TROPONINIHS 390* 549* 2,109* 2,800* 2,703*  Chemistry Recent Labs  Lab 02/10/22 0943 02/10/22 1607 02/11/22 0349 02/12/22 0304  NA 128*  --  133* 136  K 5.0  --  4.8 4.4  CL 98  --  103 105  CO2 19*  --  21* 20*  GLUCOSE 314*  --  85 72  BUN 39*  --  42* 49*  CREATININE 2.54*  --  2.62* 2.57*  CALCIUM 9.0  --  8.9 9.4  MG  --  2.1 2.0 1.9  PROT  --   --  5.7* 6.2*  ALBUMIN  --   --  3.0* 3.1*  AST  --   --  33 27  ALT  --   --  21 22  ALKPHOS  --   --  76 80  BILITOT  --   --  0.6 0.6  GFRNONAA 24*  --  24* 24*  ANIONGAP 11  --  9 11    Lipids  Recent Labs  Lab 02/10/22 1607  CHOL 102  TRIG 21  HDL 39*  LDLCALC 59  CHOLHDL 2.6    Hematology Recent Labs  Lab 02/10/22 0943 02/10/22 1607 02/11/22 0349 02/11/22 1541 02/11/22 2225 02/12/22 0304  WBC 7.2  --  7.1  --   --  7.8   RBC 3.27*   < > 2.91* 3.26*  --  3.53*  HGB 9.3*  --  8.5*  --  9.8* 10.3*  HCT 28.4*  --  24.7*  --  27.9* 30.2*  MCV 86.9  --  84.9  --   --  85.6  MCH 28.4  --  29.2  --   --  29.2  MCHC 32.7  --  34.4  --   --  34.1  RDW 14.5  --  14.6  --   --  14.5  PLT 256  --  240  --   --  249   < > = values in this interval not displayed.   Thyroid No results for input(s): "TSH", "FREET4" in the last 168 hours.  BNP Recent Labs  Lab 02/10/22 1607  BNP 845.7*    DDimer No results for input(s): "DDIMER" in the last 168 hours.   Radiology     Cardiac Studies   02/10/22; TTE  1. Left ventricular ejection fraction, by estimation, is 55 to 60%. The  left ventricle has normal function. The left ventricle demonstrates  regional wall motion abnormalities (see scoring diagram/findings for  description). Left ventricular diastolic  parameters are indeterminate. There is mild hypokinesis of the left  ventricular, entire inferior wall. There is mild hypokinesis of the left  ventricular, basal inferolateral wall.   2. Right ventricular systolic function is normal. The right ventricular  size is normal. There is normal pulmonary artery systolic pressure.   3. Left atrial size was moderately dilated.   4. The mitral valve is normal in structure. Mild to moderate mitral valve  regurgitation. No evidence of mitral stenosis.   5. The aortic valve is calcified. There is moderate calcification of the  aortic valve. There is moderate thickening of the aortic valve. Aortic  valve regurgitation is not visualized. Moderate aortic valve stenosis.   6. The inferior vena cava is normal in size with greater than 50%  respiratory variability, suggesting right atrial pressure of 3 mmHg.   Comparison(s): Prior images reviewed side by side. Changes from prior  study are noted. Overall similar LVEF, but current study with mild  hypokinesis of the  inferior/inferolateral wall as noted.     Assessment & Plan     Conduction system disease QRS is narrow Known conduction system disease Keep off all potential nodal blocking agents Await his cath (?RCA disease) No clear symptoms associated with his bradycardia Syncopal spell after a dental procedure a couple months ago Chronic sounding gait instability. He monitors his BP and HR 3 times a day (his wife is aretired Marine scientist and keeps very close watch on him), HR is always 60's-70's wether he is having a good day or weak day. Weakness seems is measured in hours/days, not moments or episodic He does have some more predictable orthostatic dizziness. His wife links his weak/fatigued days to lower BP readings when he starts to get towards 12o, feels his best when his BP is >130  At this time no clear indication for pacing Pending cath findings, perhaps a loop given a syncopal spell   Will check orthostatic vitals today  CP CAD by history Escalating CP in the last week or so to CP that was occurring with very minimal exertion He was on NTG gtt until yesterday So far no ongoing CP here  Diagnostic cath planned for today with perhaps staged intervention of needed Continue w/attending cardiology team  For questions or updates, please contact [place] Please consult [email or web address] for contact info under        Signed, Baldwin Jamaica, PA-C  02/12/2022, 11:18 AM    AV nodal conduction disease with Mobitz 1 and 2: 1 heart block  Orthostatic lightheadedness/gait instability  Non-STEMI  Renal insufficiency grade 4 Estimated Creatinine Clearance: 21.8 mL/min (A) (by C-G formula based on SCr of 2.57 mg/dL (H)).   Syncope   "Walks backwards"  The patient has Mobitz 1 heart block and associated 2: 1 heart block in the setting of underlying bifascicular block, the former AV nodal the second related His-Purkinje system.  It is not at all clear that these are symptomatic; is not clear yet whether it is secondary to his non-STEMI which seems to be  involving his inferior wall based on the echo wall motion abnormality in the AV nodal artery may be involved.  Hence, there is no indication for acute intervention with pacing.  Moreover, I am not sure there is an indication at all unless we are able to tie symptoms with bradycardia if indeed it persists.  With his narrow QRS, the likelihood that he had bradycardia arrhythmic syncope is quite low.  However, given the heart block presently evident, it might be reasonable to consider loop recorder insertion prior to discharge because of his syncope  Concern also about orthostatic lightheadedness and gait instability.  We will measure orthostatic vital signs  It is his wife's impression that his blood pressure once in the 120 range is associated with significant lassitude and that the barrage of a.m. antihypertensives resulted in feeling really crappy.  Some modification of his regime would probably be better prior to discharge

## 2022-02-12 NOTE — Interval H&P Note (Signed)
Cath Lab Visit (complete for each Cath Lab visit)  Clinical Evaluation Leading to the Procedure:   ACS: Yes.    Non-ACS:  n/a    History and Physical Interval Note:  02/12/2022 2:03 PM  Troy [name]  has presented today for surgery, with the diagnosis of nstemi.  The various methods of treatment have been discussed with the patient and family. After consideration of risks, benefits and other options for treatment, the patient has consented to  Procedure(s): LEFT HEART CATH AND CORONARY ANGIOGRAPHY (N/A) as a surgical intervention.  The patient's history has been reviewed, patient examined, no change in status, stable for surgery.  I have reviewed the patient's chart and labs.  Questions were answered to the patient's satisfaction.     [name]

## 2022-02-12 NOTE — Progress Notes (Addendum)
Progress Note  Patient Name: Troy [name] Date of Encounter: 02/12/2022  [facility] Cardiologist: Shirlee More, MD   Subjective   Episode yesterday while sitting on side of bed to eat. 1 NTG with relief.   Inpatient Medications    Scheduled Meds:  amLODipine  10 mg Oral Daily   atorvastatin  80 mg Oral QHS   clopidogrel  75 mg Oral Daily   hydrALAZINE  25 mg Oral Q8H   insulin aspart  0-9 Units Subcutaneous Q4H   insulin glargine-yfgn  10 Units Subcutaneous Daily   lipase/protease/amylase  24,000 Units Oral TID AC   sodium chloride flush  3 mL Intravenous Q12H   Continuous Infusions:  sodium chloride     heparin 1,100 Units/hr (02/12/22 0541)   nitroGLYCERIN Stopped (02/11/22 0728)   PRN Meds: sodium chloride, acetaminophen, nitroGLYCERIN, ondansetron (ZOFRAN) IV, sodium chloride flush   Vital Signs    Vitals:   02/11/22 1748 02/11/22 1939 02/11/22 2105 02/12/22 0415  BP: (!) 157/74 (!) 155/94 (!) 161/65 (!) 166/73  Pulse: 70 (!) 48 72 69  Resp: 20   16  Temp: 98.3 F (36.8 C) 98.5 F (36.9 C)  97.6 F (36.4 C)  TempSrc: Oral Oral  Oral  SpO2: 97% 95% 95% 98%  Weight:    76.3 kg  Height:        Intake/Output Summary (Last 24 hours) at 02/12/2022 0839 Last data filed at 02/12/2022 0400 Gross per 24 hour  Intake 1507.47 ml  Output 1650 ml  Net -142.53 ml      02/12/2022    4:15 AM 02/11/2022    3:50 AM 02/10/2022    6:21 PM  Last 3 Weights  Weight (lbs) 168 lb 3.2 oz 163 lb 141 lb 8.6 oz  Weight (kg) 76.295 kg 73.936 kg 64.2 kg      Telemetry    SR Mobitz 1 &2 - Personally Reviewed  ECG    No new - Personally Reviewed  Physical Exam   GEN: No acute distress.   Neck: No JVD Cardiac: RRR, 3/6 systolic murmur, no rubs, or gallops.  Respiratory: Clear to auscultation bilaterally. GI: Soft, nontender, non-distended  MS: No edema; No deformity. Neuro:  Nonfocal  Psych: Normal affect   Labs    High Sensitivity Troponin:   Recent Labs   Lab 02/10/22 0943 02/10/22 1132 02/10/22 1607 02/10/22 1745 02/10/22 2039  TROPONINIHS 390* 549* 2,109* 2,800* 2,703*     Chemistry Recent Labs  Lab 02/10/22 0943 02/10/22 1607 02/11/22 0349 02/12/22 0304  NA 128*  --  133* 136  K 5.0  --  4.8 4.4  CL 98  --  103 105  CO2 19*  --  21* 20*  GLUCOSE 314*  --  85 72  BUN 39*  --  42* 49*  CREATININE 2.54*  --  2.62* 2.57*  CALCIUM 9.0  --  8.9 9.4  MG  --  2.1 2.0 1.9  PROT  --   --  5.7* 6.2*  ALBUMIN  --   --  3.0* 3.1*  AST  --   --  33 27  ALT  --   --  21 22  ALKPHOS  --   --  76 80  BILITOT  --   --  0.6 0.6  GFRNONAA 24*  --  24* 24*  ANIONGAP 11  --  9 11    Lipids  Recent Labs  Lab 02/10/22 1607  CHOL 102  TRIG 21  HDL 39*  LDLCALC 59  CHOLHDL 2.6    Hematology Recent Labs  Lab 02/10/22 0943 02/10/22 1607 02/11/22 0349 02/11/22 1541 02/11/22 2225 02/12/22 0304  WBC 7.2  --  7.1  --   --  7.8  RBC 3.27*   < > 2.91* 3.26*  --  3.53*  HGB 9.3*  --  8.5*  --  9.8* 10.3*  HCT 28.4*  --  24.7*  --  27.9* 30.2*  MCV 86.9  --  84.9  --   --  85.6  MCH 28.4  --  29.2  --   --  29.2  MCHC 32.7  --  34.4  --   --  34.1  RDW 14.5  --  14.6  --   --  14.5  PLT 256  --  240  --   --  249   < > = values in this interval not displayed.   Thyroid No results for input(s): "TSH", "FREET4" in the last 168 hours.  BNP Recent Labs  Lab 02/10/22 1607  BNP 845.7*    DDimer No results for input(s): "DDIMER" in the last 168 hours.   Radiology    ECHOCARDIOGRAM COMPLETE  Result Date: 02/10/2022    ECHOCARDIOGRAM REPORT   Patient Name:   ASKARI KINLEY Date of Exam: 02/10/2022 Medical Rec #:  [id number]      Height:       71.0 in Accession #:    [id number]     Weight:       168.4 lb Date of Birth:  [birth year]      BSA:          1.960 m Patient Age:    63 years       BP:           138/46 mmHg Patient Gender: M              HR:           47 bpm. Exam Location:  Inpatient Procedure: 2D Echo, Cardiac Doppler, Color  Doppler and 3D Echo                       STAT ECHO Reported to: Dr. Dia Crawford on 02/10/2022 3:00:00 PM. Indications:    NSTEMI I21.4  History:        Patient has prior history of Echocardiogram examinations, most                 recent 10/02/2020. CAD; Risk Factors:Dyslipidemia.  Sonographer:    Bernadene Person RDCS Referring Phys: Geraldo Docker WOODS IMPRESSIONS  1. Left ventricular ejection fraction, by estimation, is 55 to 60%. The left ventricle has normal function. The left ventricle demonstrates regional wall motion abnormalities (see scoring diagram/findings for description). Left ventricular diastolic parameters are indeterminate. There is mild hypokinesis of the left ventricular, entire inferior wall. There is mild hypokinesis of the left ventricular, basal inferolateral wall.  2. Right ventricular systolic function is normal. The right ventricular size is normal. There is normal pulmonary artery systolic pressure.  3. Left atrial size was moderately dilated.  4. The mitral valve is normal in structure. Mild to moderate mitral valve regurgitation. No evidence of mitral stenosis.  5. The aortic valve is calcified. There is moderate calcification of the aortic valve. There is moderate thickening of the aortic valve. Aortic valve regurgitation is not visualized. Moderate aortic valve stenosis.  6. The inferior vena cava  is normal in size with greater than 50% respiratory variability, suggesting right atrial pressure of 3 mmHg. Comparison(s): Prior images reviewed side by side. Changes from prior study are noted. Overall similar LVEF, but current study with mild hypokinesis of the inferior/inferolateral wall as noted. Conclusion(s)/Recommendation(s): New mild wall motion abnormalities with preserved EF. Moderate aortic stenosis. Sinus bradycardia (as low as 37 bpm) noted throughout study. FINDINGS  Left Ventricle: Left ventricular ejection fraction, by estimation, is 55 to 60%. The left ventricle has normal  function. The left ventricle demonstrates regional wall motion abnormalities. Mild hypokinesis of the left ventricular, entire inferior wall. Mild hypokinesis of the left ventricular, basal inferolateral wall. The left ventricular internal cavity size was normal in size. There is no left ventricular hypertrophy. Left ventricular diastolic parameters are indeterminate. Right Ventricle: The right ventricular size is normal. Right vetricular wall thickness was not well visualized. Right ventricular systolic function is normal. There is normal pulmonary artery systolic pressure. The tricuspid regurgitant velocity is 2.75 m/s, and with an assumed right atrial pressure of 3 mmHg, the estimated right ventricular systolic pressure is 62.8 mmHg. Left Atrium: Left atrial size was moderately dilated. Right Atrium: Right atrial size was normal in size. Pericardium: There is no evidence of pericardial effusion. Mitral Valve: The mitral valve is normal in structure. There is moderate thickening of the mitral valve leaflet(s). There is moderate calcification of the mitral valve leaflet(s). Mild to moderate mitral valve regurgitation. No evidence of mitral valve stenosis. Tricuspid Valve: The tricuspid valve is normal in structure. Tricuspid valve regurgitation is mild . No evidence of tricuspid stenosis. Aortic Valve: The aortic valve is calcified. There is moderate calcification of the aortic valve. There is moderate thickening of the aortic valve. Aortic valve regurgitation is not visualized. Moderate aortic stenosis is present. Aortic valve mean gradient measures 23.3 mmHg. Aortic valve peak gradient measures 40.6 mmHg. Aortic valve area, by VTI measures 0.99 cm. Pulmonic Valve: The pulmonic valve was not well visualized. Pulmonic valve regurgitation is not visualized. No evidence of pulmonic stenosis. Aorta: The aortic root and ascending aorta are structurally normal, with no evidence of dilitation. Venous: The inferior vena  cava is normal in size with greater than 50% respiratory variability, suggesting right atrial pressure of 3 mmHg. IAS/Shunts: The interatrial septum was not well visualized.  LEFT VENTRICLE PLAX 2D LVIDd:         4.80 cm      Diastology LVIDs:         3.40 cm      LV e' medial:    9.81 cm/s LV PW:         1.10 cm      LV E/e' medial:  13.0 LV IVS:        1.10 cm      LV e' lateral:   10.20 cm/s LVOT diam:     2.10 cm      LV E/e' lateral: 12.5 LV SV:         87 LV SV Index:   44 LVOT Area:     3.46 cm                              3D Volume EF: LV Volumes (MOD)            3D EF:        50 % LV vol d, MOD A2C: 120.0 ml LV EDV:  178 ml LV vol d, MOD A4C: 121.0 ml LV ESV:       89 ml LV vol s, MOD A2C: 46.0 ml  LV SV:        89 ml LV vol s, MOD A4C: 51.0 ml LV SV MOD A2C:     74.0 ml LV SV MOD A4C:     121.0 ml LV SV MOD BP:      75.4 ml RIGHT VENTRICLE RV S prime:     13.10 cm/s TAPSE (M-mode): 2.3 cm LEFT ATRIUM             Index        RIGHT ATRIUM           Index LA diam:        4.80 cm 2.45 cm/m   RA Area:     14.40 cm LA Vol (A2C):   69.4 ml 35.41 ml/m  RA Volume:   32.90 ml  16.78 ml/m LA Vol (A4C):   74.9 ml 38.21 ml/m LA Biplane Vol: 75.4 ml 38.47 ml/m  AORTIC VALVE AV Area (Vmax):    1.10 cm AV Area (Vmean):   0.88 cm AV Area (VTI):     0.99 cm AV Vmax:           318.67 cm/s AV Vmean:          228.333 cm/s AV VTI:            0.878 m AV Peak Grad:      40.6 mmHg AV Mean Grad:      23.3 mmHg LVOT Vmax:         101.00 cm/s LVOT Vmean:        58.100 cm/s LVOT VTI:          0.250 m LVOT/AV VTI ratio: 0.28  AORTA Ao Root diam: 3.30 cm Ao Asc diam:  3.50 cm MITRAL VALVE                  TRICUSPID VALVE MV Area (PHT): 5.13 cm       TR Peak grad:   30.2 mmHg MV Decel Time: 148 msec       TR Vmax:        275.00 cm/s MR Peak grad:    141.1 mmHg MR Mean grad:    97.0 mmHg    SHUNTS MR Vmax:         594.00 cm/s  Systemic VTI:  0.25 m MR Vmean:        471.0 cm/s   Systemic Diam: 2.10 cm MR PISA:         1.01  cm MR PISA Eff ROA: 7 mm MR PISA Radius:  0.40 cm MV E velocity: 128.00 cm/s MV A velocity: 79.10 cm/s MV E/A ratio:  1.62 Buford Dresser MD Electronically signed by Buford Dresser MD Signature Date/Time: 02/10/2022/3:01:01 PM    Final    DG Chest 1 View  Result Date: 02/10/2022 CLINICAL DATA:  86 year old male with chest pain. Feel similar to prior MI. EXAM: CHEST  1 VIEW COMPARISON:  Chest radiographs 10/01/2020 and earlier. FINDINGS: PA view at 0946 hours. Stable lung volumes, hyperinflated. Stable mild eventration of the diaphragm, normal variant. Mediastinal contours are stable and within normal limits. Visualized tracheal air column is within normal limits. Mild EKG button artifact in both upper lungs. No pneumothorax, pulmonary edema, pleural effusion or confluent pulmonary opacity. No acute osseous abnormality identified. Negative visible bowel gas. IMPRESSION: Chronic pulmonary hyperinflation.  No acute cardiopulmonary abnormality. Electronically Signed   By: Genevie [name] M.D.   On: 02/10/2022 09:57    Cardiac Studies   Echo 02/10/22  IMPRESSIONS     1. Left ventricular ejection fraction, by estimation, is 55 to 60%. The  left ventricle has normal function. The left ventricle demonstrates  regional wall motion abnormalities (see scoring diagram/findings for  description). Left ventricular diastolic  parameters are indeterminate. There is mild hypokinesis of the left  ventricular, entire inferior wall. There is mild hypokinesis of the left  ventricular, basal inferolateral wall.   2. Right ventricular systolic function is normal. The right ventricular  size is normal. There is normal pulmonary artery systolic pressure.   3. Left atrial size was moderately dilated.   4. The mitral valve is normal in structure. Mild to moderate mitral valve  regurgitation. No evidence of mitral stenosis.   5. The aortic valve is calcified. There is moderate calcification of the  aortic valve.  There is moderate thickening of the aortic valve. Aortic  valve regurgitation is not visualized. Moderate aortic valve stenosis.   6. The inferior vena cava is normal in size with greater than 50%  respiratory variability, suggesting right atrial pressure of 3 mmHg.   Comparison(s): Prior images reviewed side by side. Changes from prior  study are noted. Overall similar LVEF, but current study with mild  hypokinesis of the inferior/inferolateral wall as noted.   Conclusion(s)/Recommendation(s): New mild wall motion abnormalities with  preserved EF. Moderate aortic stenosis. Sinus bradycardia (as low as 37  bpm) noted throughout study.   FINDINGS   Left Ventricle: Left ventricular ejection fraction, by estimation, is 55  to 60%. The left ventricle has normal function. The left ventricle  demonstrates regional wall motion abnormalities. Mild hypokinesis of the  left ventricular, entire inferior wall.  Mild hypokinesis of the left ventricular, basal inferolateral wall. The  left ventricular internal cavity size was normal in size. There is no left  ventricular hypertrophy. Left ventricular diastolic parameters are  indeterminate.   Right Ventricle: The right ventricular size is normal. Right vetricular  wall thickness was not well visualized. Right ventricular systolic  function is normal. There is normal pulmonary artery systolic pressure.  The tricuspid regurgitant velocity is 2.75  m/s, and with an assumed right atrial pressure of 3 mmHg, the estimated  right ventricular systolic pressure is 16.8 mmHg.   Left Atrium: Left atrial size was moderately dilated.   Right Atrium: Right atrial size was normal in size.   Pericardium: There is no evidence of pericardial effusion.   Mitral Valve: The mitral valve is normal in structure. There is moderate  thickening of the mitral valve leaflet(s). There is moderate calcification  of the mitral valve leaflet(s). Mild to moderate mitral  valve  regurgitation. No evidence of mitral valve  stenosis.   Tricuspid Valve: The tricuspid valve is normal in structure. Tricuspid  valve regurgitation is mild . No evidence of tricuspid stenosis.   Aortic Valve: The aortic valve is calcified. There is moderate  calcification of the aortic valve. There is moderate thickening of the  aortic valve. Aortic valve regurgitation is not visualized. Moderate  aortic stenosis is present. Aortic valve mean  gradient measures 23.3 mmHg. Aortic valve peak gradient measures 40.6  mmHg. Aortic valve area, by VTI measures 0.99 cm.   Pulmonic Valve: The pulmonic valve was not well visualized. Pulmonic valve  regurgitation is not visualized. No evidence of pulmonic stenosis.   Aorta:  The aortic root and ascending aorta are structurally normal, with  no evidence of dilitation.   Venous: The inferior vena cava is normal in size with greater than 50%  respiratory variability, suggesting right atrial pressure of 3 mmHg.   IAS/Shunts: The interatrial septum was not well visualized.   Patient Profile     86 y.o. male with a hx of CHF, HTN, CKD stage 4, CAD with prior PCI in 2001, aortic stenosis, second degree AV block, Mobitz 1 and 2-1 block, and PAD with prior stenting Right common iliac artery in 2020 => consulted on 02/10/2022 for the evaluation of chest pain/ACS at the request of Dr. Sherral Hammers.  => Subsequently ruled in for non-ST elevation MI with troponin of greater than 2700.    Assessment & Plan    NSTEMI --progressive anginal symptoms and trop to 2800.   --Dr. Ellyn Hack to discuss cath - pt with CKD 4 and Cr 2.57  --chest pain free on IV heparin and IV NTG.  And ASA --no BB with brady --No ACE or ARB due to CKD 4 --high dose statin --would do diagnostic cath then PCI for renal function and need for PPM  Coronary artery disease involving native coronary artery of native heart with unstable angina pectoris  Hx PCI in 2001.   --Inferior  inferolateral hypokinesis on echo which suggest likely RCA distribution disease, but could very well have multivessel disease. Now with progressive anginal symptoms leading to ACS presentation and elevated troponins, we would like to proceed with cardiac catheterization pending improvement in renal function --see above   HFrEF (heart failure with reduced ejection fraction) (HCC) => combination hypertensive heart disease and CAD related --Most likely HFpEF with elevated BNP, but does not seem to be volume overloaded.  Echocardiogram did not suggest significant DHF. --We will wait to see what LVEDP during cardiac catheterization to determine volume status.   Hyperlipidemia associated with type 2 diabetes mellitus   LDL 59, but with ACS presentation, started on atorvastatin 80 mg daily.  follow up hepatic and lipid in 6-8 weeks   Bradycardia with conduction system disease/Mobitz 1 but also 2 at times. --no BB EP has seen  --clonidine stopped --likely will need PPM --if needs PCI would do PPM first.  Moderate AS  --follow       For questions or updates, please contact [facility] Please consult [email or web address] for contact info under        Signed, Cecilie Kicks, NP  02/12/2022, 8:39 AM     ATTENDING ATTESTATION  I have seen, examined and evaluated the patient this morning along with Garen Lah, NP.  After reviewing all the available data and chart, we discussed the patients laboratory, study & physical findings as well as symptoms in detail. I agree with her findings, examination as well as impression recommendations as per our discussion.    Doing well today.  Not having any further chest pain.  Renal function has stabilized, I think at this point we can feel comfortable with diagnostic catheterization using minimal contrast.  Based on those results we can decide what to do next.  Was seen by EP.  Await final consideration of plan. If they EP procedure is needed before  performed, it would be unlikely pertinent prior to potential PCI that would be staged.   Glenetta Hew, M.D., M.S. Interventional Cardiologist   Pager # [phone] Phone # [phone] [address] [place], [place] [address]

## 2022-02-12 NOTE — Progress Notes (Signed)
Heart Failure Navigator Progress Note  Following this hospitalization to assess for HV TOC readiness.   Cath ? 7/19 ( watch renal fxn) EF 50-60 % HFpEF CKD 4 ( creat 2.57) ? Ep (PPM)  Earnestine Leys, BSN, RN Heart Failure Transport planner Only

## 2022-02-13 ENCOUNTER — Encounter (HOSPITAL_COMMUNITY): Payer: Self-pay | Admitting: Cardiovascular Disease

## 2022-02-13 DIAGNOSIS — R079 Chest pain, unspecified: Secondary | ICD-10-CM | POA: Diagnosis not present

## 2022-02-13 DIAGNOSIS — N1832 Chronic kidney disease, stage 3b: Secondary | ICD-10-CM | POA: Diagnosis not present

## 2022-02-13 DIAGNOSIS — R001 Bradycardia, unspecified: Secondary | ICD-10-CM | POA: Diagnosis not present

## 2022-02-13 DIAGNOSIS — I214 Non-ST elevation (NSTEMI) myocardial infarction: Secondary | ICD-10-CM | POA: Diagnosis not present

## 2022-02-13 LAB — CBC
HCT: 32.8 % — ABNORMAL LOW (ref 39.0–52.0)
Hemoglobin: 11.3 g/dL — ABNORMAL LOW (ref 13.0–17.0)
MCH: 29 pg (ref 26.0–34.0)
MCHC: 34.5 g/dL (ref 30.0–36.0)
MCV: 84.1 fL (ref 80.0–100.0)
Platelets: 262 10*3/uL (ref 150–400)
RBC: 3.9 MIL/uL — ABNORMAL LOW (ref 4.22–5.81)
RDW: 14.6 % (ref 11.5–15.5)
WBC: 9.9 10*3/uL (ref 4.0–10.5)
nRBC: 0 % (ref 0.0–0.2)

## 2022-02-13 LAB — COMPREHENSIVE METABOLIC PANEL
ALT: 84 U/L — ABNORMAL HIGH (ref 0–44)
AST: 83 U/L — ABNORMAL HIGH (ref 15–41)
Albumin: 3.4 g/dL — ABNORMAL LOW (ref 3.5–5.0)
Alkaline Phosphatase: 204 U/L — ABNORMAL HIGH (ref 38–126)
Anion gap: 10 (ref 5–15)
BUN: 41 mg/dL — ABNORMAL HIGH (ref 8–23)
CO2: 21 mmol/L — ABNORMAL LOW (ref 22–32)
Calcium: 9.5 mg/dL (ref 8.9–10.3)
Chloride: 107 mmol/L (ref 98–111)
Creatinine, Ser: 2.28 mg/dL — ABNORMAL HIGH (ref 0.61–1.24)
GFR, Estimated: 28 mL/min — ABNORMAL LOW (ref >60)
Glucose, Bld: 98 mg/dL (ref 70–99)
Potassium: 4.4 mmol/L (ref 3.5–5.1)
Sodium: 138 mmol/L (ref 135–145)
Total Bilirubin: 1 mg/dL (ref 0.3–1.2)
Total Protein: 6.9 g/dL (ref 6.5–8.1)

## 2022-02-13 LAB — GLUCOSE, CAPILLARY
Glucose-Capillary: 211 mg/dL — ABNORMAL HIGH (ref 70–99)
Glucose-Capillary: 230 mg/dL — ABNORMAL HIGH (ref 70–99)
Glucose-Capillary: 239 mg/dL — ABNORMAL HIGH (ref 70–99)
Glucose-Capillary: 79 mg/dL (ref 70–99)
Glucose-Capillary: 96 mg/dL (ref 70–99)

## 2022-02-13 LAB — MAGNESIUM: Magnesium: 1.8 mg/dL (ref 1.7–2.4)

## 2022-02-13 LAB — HEPARIN LEVEL (UNFRACTIONATED)
Heparin Unfractionated: 0.15 IU/mL — ABNORMAL LOW (ref 0.30–0.70)
Heparin Unfractionated: 0.49 IU/mL (ref 0.30–0.70)

## 2022-02-13 LAB — PHOSPHORUS: Phosphorus: 3.8 mg/dL (ref 2.5–4.6)

## 2022-02-13 MED ORDER — CLOPIDOGREL BISULFATE 75 MG PO TABS
75.0000 mg | ORAL_TABLET | Freq: Every day | ORAL | Status: DC
  Administered 2022-02-15: 75 mg via ORAL
  Filled 2022-02-13: qty 1

## 2022-02-13 MED ORDER — CLOPIDOGREL BISULFATE 75 MG PO TABS
300.0000 mg | ORAL_TABLET | Freq: Once | ORAL | Status: AC
  Administered 2022-02-14: 300 mg via ORAL
  Filled 2022-02-13: qty 4

## 2022-02-13 NOTE — Progress Notes (Signed)
ANTICOAGULATION CONSULT NOTE  Pharmacy Consult for Heparin infusion Indication: chest pain/ACS  No Known Allergies  Patient Measurements: Height: '5\' 9"'$  (175.3 cm) Weight: 72.4 kg (159 lb 9.6 oz) IBW/kg (Calculated) : 70.7 Heparin Dosing Weight: 76.4 kg  Vital Signs: Temp: 97.8 F (36.6 C) (07/20 0404) Temp Source: Oral (07/20 0404) BP: 136/53 (07/20 0404) Pulse Rate: 70 (07/20 0404)  Labs: Recent Labs    02/10/22 0943 02/10/22 1607 02/10/22 1745 02/10/22 2039 02/11/22 0349 02/11/22 2225 02/12/22 0304 02/13/22 0656  HGB  --   --   --   --  8.5* 9.8* 10.3* 11.3*  HCT  --   --   --   --  24.7* 27.9* 30.2* 32.8*  PLT  --   --   --   --  240  --  249 262  HEPARINUNFRC   < >  --   --  0.45 0.24*  --  0.40 0.15*  CREATININE  --   --   --   --  2.62*  --  2.57* 2.28*  TROPONINIHS  --  2,109* 2,800* 2,703*  --   --   --   --    < > = values in this interval not displayed.     Estimated Creatinine Clearance: 24.5 mL/min (A) (by C-G formula based on SCr of 2.28 mg/dL (H)).   Assessment: 86 yo M admitted for chest pain x3 wks. Pain is off/on and worsens with exertion. Endorses SOB and dizziness. Patient was not taking any oral anticoagulants prior to admission. Pharmacy consulted to dose heparin infusion for chest pain/ACS protocol.  Heparin subtherapeutic at 0.15, CBC stable. S/p cath yesterday (7/19), plan for staged PCI Friday (7/21). Heparin restarted 7/19 2030. No issues with infusion or s/sx of bleeding noted.   Goal of Therapy:  Heparin level 0.3-0.7 units/ml Monitor platelets by anticoagulation protocol: Yes   Plan:  Increase heparin to 1400 units/hr F/u 8 hr heparin level  Monitor daily CBC, heparin level  Francena Hanly, PharmD Pharmacy Resident  02/13/2022 9:07 AM

## 2022-02-13 NOTE — Progress Notes (Signed)
PROGRESS NOTE    Troy [name]  NID:[id number] DOB: Aug 17, 1935 DOA: 02/10/2022 PCP: Venetia Maxon, [name], MD    Brief Narrative:  Troy [name] is a 86 y.o. with prior h/o CAD, Mobitz type I (Wenckebach), HTN,, Aortic Stenosis, chronic diastolic CHF, HLD, necrotizing pancreatitis, CKD stage IV  presented with sob, chest pain and dizziness.  He was found to have non-STEMI and currently undergoing staged PCI.   Assessment & Plan:   Non-ST elevation MI: Recurrent chest pain. Currently remains on aspirin, Plavix, atorvastatin, IV heparin and IV nitroglycerin.  Loaded with Plavix today. Underwent cardiac catheterization 7/19 and found to have intact left-sided coronaries. Distal RCA blockage with a ruptured plaque.  Cardiology planning for scheduled multistage PCI on 7/21.  Bradycardia with high-grade AV block: In the setting of known Mobitz type I AV block.  Followed by EP cardiology.  Currently not recommending any intervention or pacemaker.  Monitor in the hospital.  Hopefully will improve with improvement of ischemia.  CKD stage IV: At about baseline creatinine.  Continue monitoring with use of contrast.  Essential hypertension: On amlodipine and well controlled.  Chronic normocytic anemia: At about baseline.  Type 2 diabetes: Well-controlled.  Remains on insulin.  Hyperlipidemia: On statins.  Elevated transaminases: Unknown cause.  We will recheck tomorrow morning.  We will continue statin.    DVT prophylaxis:   Heparin infusion   Code Status: Full code Family Communication: Wife at the bedside Disposition Plan: Status is: Inpatient Remains inpatient appropriate because: Inpatient procedures planned, remains on high risk infusions     Consultants:  Cardiology  Procedures:  Cardiac cath planned  Antimicrobials:  None   Subjective:  Seen and examined.  No overnight events.  No more chest pain.  Excited to see his kidney functions remaining well. Wife at  bedside.  Telemetry with first-degree AV block.  Objective: Vitals:   02/12/22 1938 02/13/22 0404 02/13/22 0532 02/13/22 1157  BP: (!) 154/74 (!) 136/53  (!) 156/70  Pulse: 74 70  68  Resp:    18  Temp: 98.3 F (36.8 C) 97.8 F (36.6 C)  97.7 F (36.5 C)  TempSrc: Oral Oral  Oral  SpO2: 96% 98%  95%  Weight:   72.4 kg   Height:        Intake/Output Summary (Last 24 hours) at 02/13/2022 1319 Last data filed at 02/13/2022 0325 Gross per 24 hour  Intake 1321.35 ml  Output 300 ml  Net 1021.35 ml   Filed Weights   02/11/22 0350 02/12/22 0415 02/13/22 0532  Weight: 73.9 kg 76.3 kg 72.4 kg    Examination:  General: Comfortable Cardiovascular: S1-S2 normal.  Regular rate rhythm. Respiratory: Bilateral clear.  No respiratory distress.  On room air. Gastrointestinal: Soft.  Nontender. Ext: No edema. Neuro: Intact. Musculoskeletal: No deformities.      Data Reviewed: I have personally reviewed following labs and imaging studies  CBC: Recent Labs  Lab 02/07/22 1154 02/10/22 0943 02/11/22 0349 02/11/22 2225 02/12/22 0304 02/13/22 0656  WBC 8.7 7.2 7.1  --  7.8 9.9  HGB 9.6* 9.3* 8.5* 9.8* 10.3* 11.3*  HCT 29.1* 28.4* 24.7* 27.9* 30.2* 32.8*  MCV 87 86.9 84.9  --  85.6 84.1  PLT 262 256 240  --  249 144   Basic Metabolic Panel: Recent Labs  Lab 02/07/22 1154 02/10/22 0943 02/10/22 1607 02/11/22 0349 02/12/22 0304 02/13/22 0656  NA 129* 128*  --  133* 136 138  K 5.5* 5.0  --  4.8 4.4 4.4  CL 97 98  --  103 105 107  CO2 20 19*  --  21* 20* 21*  GLUCOSE 130* 314*  --  85 72 98  BUN 36* 39*  --  42* 49* 41*  CREATININE 2.23* 2.54*  --  2.62* 2.57* 2.28*  CALCIUM 9.9 9.0  --  8.9 9.4 9.5  MG  --   --  2.1 2.0 1.9 1.8  PHOS  --   --   --  4.6 4.8* 3.8   GFR: Estimated Creatinine Clearance: 24.5 mL/min (A) (by C-G formula based on SCr of 2.28 mg/dL (H)). Liver Function Tests: Recent Labs  Lab 02/11/22 0349 02/12/22 0304 02/13/22 0656  AST 33 27 83*   ALT 21 22 84*  ALKPHOS 76 80 204*  BILITOT 0.6 0.6 1.0  PROT 5.7* 6.2* 6.9  ALBUMIN 3.0* 3.1* 3.4*   No results for input(s): "LIPASE", "AMYLASE" in the last 168 hours. No results for input(s): "AMMONIA" in the last 168 hours. Coagulation Profile: No results for input(s): "INR", "PROTIME" in the last 168 hours. Cardiac Enzymes: No results for input(s): "CKTOTAL", "CKMB", "CKMBINDEX", "TROPONINI" in the last 168 hours. BNP (last 3 results) No results for input(s): "PROBNP" in the last 8760 hours. HbA1C: Recent Labs    02/10/22 1607  HGBA1C 6.8*   CBG: Recent Labs  Lab 02/12/22 2006 02/13/22 0009 02/13/22 0403 02/13/22 0826 02/13/22 1154  GLUCAP 164* 96 79 239* 211*   Lipid Profile: Recent Labs    02/10/22 1607  CHOL 102  HDL 39*  LDLCALC 59  TRIG 21  CHOLHDL 2.6   Thyroid Function Tests: No results for input(s): "TSH", "T4TOTAL", "FREET4", "T3FREE", "THYROIDAB" in the last 72 hours. Anemia Panel: Recent Labs    02/10/22 1607 02/11/22 1541 02/11/22 1603  VITAMINB12 1,039*  --  1,023*  FOLATE 32.6  --  17.8  FERRITIN 101  --  120  TIBC 267  --  276  IRON 45  --  75  RETICCTPCT 1.0 1.0  --    Sepsis Labs: No results for input(s): "PROCALCITON", "LATICACIDVEN" in the last 168 hours.  No results found for this or any previous visit (from the past 240 hour(s)).       Radiology Studies: CARDIAC CATHETERIZATION  Result Date: 02/12/2022   Mid Cx lesion is 30% stenosed.   Prox RCA lesion is 90% stenosed.   Prox RCA to Mid RCA lesion is 50% stenosed.   Mid RCA lesion is 60% stenosed.   RPDA lesion is 50% stenosed. 1.  Severe one-vessel coronary artery disease involving the right coronary artery.  Patent right coronary artery stents with moderate diffuse in-stent restenosis.  However, there is a 90% stenosis at the proximal edge of the stent with hazy appearance highly suggestive of plaque rupture.  In addition, there is a 60% distal edge stenosis. 2.  Moderate  aortic stenosis with mean gradient of 23 mmHg. 3.  Moderately difficult procedure via the right radial artery due to significant tortuosity of the innominate and right subclavian arteries. Recommendations: 35 mL of contrast was used for today's diagnostic procedure.  Gentle hydration postprocedure. Recommend staged RCA PCI on Friday.  I suspect that the whole length of the lesion within the previously placed stents will need to be treated with another drug-eluting stent. Consider femoral artery access for better support given difficulty engaging the right coronary artery from the right radial artery.        Scheduled Meds:  amLODipine  10 mg Oral Daily   aspirin EC  81 mg Oral Daily   atorvastatin  80 mg Oral QHS   [START ON 02/14/2022] clopidogrel  300 mg Oral Once   [START ON 02/15/2022] clopidogrel  75 mg Oral Daily   hydrALAZINE  25 mg Oral Q8H   insulin aspart  0-9 Units Subcutaneous Q4H   insulin glargine-yfgn  10 Units Subcutaneous Daily   lipase/protease/amylase  24,000 Units Oral TID AC   sodium chloride flush  3 mL Intravenous Q12H   sodium chloride flush  3 mL Intravenous Q12H   sodium chloride flush  3 mL Intravenous Q12H   Continuous Infusions:  sodium chloride     sodium chloride     heparin 1,400 Units/hr (02/13/22 0905)   nitroGLYCERIN Stopped (02/11/22 0728)     LOS: 2 days    Time spent: 35 minutes    Barb Merino, MD [facility] Pager [phone]

## 2022-02-13 NOTE — Progress Notes (Signed)
ANTICOAGULATION CONSULT NOTE  Pharmacy Consult for Heparin infusion Indication: chest pain/ACS  No Known Allergies  Patient Measurements: Height: '5\' 9"'$  (175.3 cm) Weight: 72.4 kg (159 lb 9.6 oz) IBW/kg (Calculated) : 70.7 Heparin Dosing Weight: 76.4 kg  Vital Signs: Temp: 98.4 F (36.9 C) (07/20 1950) Temp Source: Oral (07/20 1950) BP: 166/64 (07/20 1950) Pulse Rate: 68 (07/20 1157)  Labs: Recent Labs    02/10/22 2039 02/10/22 2039 02/11/22 0349 02/11/22 2225 02/12/22 0304 02/13/22 0656 02/13/22 1801  HGB  --    < > 8.5* 9.8* 10.3* 11.3*  --   HCT  --    < > 24.7* 27.9* 30.2* 32.8*  --   PLT  --   --  240  --  249 262  --   HEPARINUNFRC 0.45  --  0.24*  --  0.40 0.15* 0.49  CREATININE  --   --  2.62*  --  2.57* 2.28*  --   TROPONINIHS 2,703*  --   --   --   --   --   --    < > = values in this interval not displayed.    Estimated Creatinine Clearance: 24.5 mL/min (A) (by C-G formula based on SCr of 2.28 mg/dL (H)).  Assessment: 86 yo M admitted for chest pain x3 wks. Pain is off/on and worsens with exertion. Endorses SOB and dizziness. Patient was not taking any oral anticoagulants prior to admission. Pharmacy consulted to dose heparin infusion for chest pain/ACS protocol.  Heparin therapeutic at 0.49, CBC stable. S/p cath yesterday (7/19), plan for staged PCI Friday (7/21). Heparin restarted 7/19 2030. Heparin at 1400 units/hr. No issues with infusion or s/sx of bleeding noted.   Goal of Therapy:  Heparin level 0.3-0.7 units/ml Monitor platelets by anticoagulation protocol: Yes   Plan:  Continue heparin 1400 units/hr F/u 8 hr heparin level  Monitor daily CBC, heparin level Monitor for signs/symptoms of bleeding  Jeneen Rinks, Pharm.D PGY1 Pharmacy Resident 02/13/2022 8:21 PM

## 2022-02-13 NOTE — Progress Notes (Addendum)
Progress Note  Patient Name: Troy [name] Date of Encounter: 02/13/2022  [facility] Cardiologist: Shirlee More, MD   Subjective   Feeling well, no complaints. Family at the bedside.   Inpatient Medications    Scheduled Meds:  amLODipine  10 mg Oral Daily   aspirin EC  81 mg Oral Daily   atorvastatin  80 mg Oral QHS   clopidogrel  75 mg Oral Daily   hydrALAZINE  25 mg Oral Q8H   insulin aspart  0-9 Units Subcutaneous Q4H   insulin glargine-yfgn  10 Units Subcutaneous Daily   lipase/protease/amylase  24,000 Units Oral TID AC   sodium chloride flush  3 mL Intravenous Q12H   sodium chloride flush  3 mL Intravenous Q12H   sodium chloride flush  3 mL Intravenous Q12H   Continuous Infusions:  sodium chloride     sodium chloride     heparin 1,100 Units/hr (02/13/22 0325)   nitroGLYCERIN Stopped (02/11/22 0728)   PRN Meds: sodium chloride, sodium chloride, acetaminophen, nitroGLYCERIN, ondansetron (ZOFRAN) IV, sodium chloride flush, sodium chloride flush   Vital Signs    Vitals:   02/12/22 1446 02/12/22 1938 02/13/22 0404 02/13/22 0532  BP: (!) 150/62 (!) 154/74 (!) 136/53   Pulse: 80 74 70   Resp:      Temp:  98.3 F (36.8 C) 97.8 F (36.6 C)   TempSrc:  Oral Oral   SpO2: 96% 96% 98%   Weight:    72.4 kg  Height:        Intake/Output Summary (Last 24 hours) at 02/13/2022 0847 Last data filed at 02/13/2022 0325 Gross per 24 hour  Intake 1321.35 ml  Output 300 ml  Net 1021.35 ml      02/13/2022    5:32 AM 02/12/2022    4:15 AM 02/11/2022    3:50 AM  Last 3 Weights  Weight (lbs) 159 lb 9.6 oz 168 lb 3.2 oz 163 lb  Weight (kg) 72.394 kg 76.295 kg 73.936 kg      Telemetry    Sinus Rhythm, 1st degree AVB, intermittent Mobitz 1 - Personally Reviewed  ECG    No new tracing  Physical Exam   GEN: No acute distress.   Neck: No JVD Cardiac: RRR, + systolic murmur, no rubs, or gallops.  Respiratory: Clear to auscultation bilaterally. GI: Soft,  nontender, non-distended  MS: No edema; No deformity. Right radial cath site stable. Neuro:  Nonfocal  Psych: Normal affect   Labs    High Sensitivity Troponin:   Recent Labs  Lab 02/10/22 0943 02/10/22 1132 02/10/22 1607 02/10/22 1745 02/10/22 2039  TROPONINIHS 390* 549* 2,109* 2,800* 2,703*     Chemistry Recent Labs  Lab 02/11/22 0349 02/12/22 0304 02/13/22 0656  NA 133* 136 138  K 4.8 4.4 4.4  CL 103 105 107  CO2 21* 20* 21*  GLUCOSE 85 72 98  BUN 42* 49* 41*  CREATININE 2.62* 2.57* 2.28*  CALCIUM 8.9 9.4 9.5  MG 2.0 1.9 1.8  PROT 5.7* 6.2* 6.9  ALBUMIN 3.0* 3.1* 3.4*  AST 33 27 83*  ALT 21 22 84*  ALKPHOS 76 80 204*  BILITOT 0.6 0.6 1.0  GFRNONAA 24* 24* 28*  ANIONGAP '9 11 10    '$ Lipids  Recent Labs  Lab 02/10/22 1607  CHOL 102  TRIG 21  HDL 39*  LDLCALC 59  CHOLHDL 2.6    Hematology Recent Labs  Lab 02/11/22 0349 02/11/22 1541 02/11/22 2225 02/12/22 0304 02/13/22 4696  WBC 7.1  --   --  7.8 9.9  RBC 2.91* 3.26*  --  3.53* 3.90*  HGB 8.5*  --  9.8* 10.3* 11.3*  HCT 24.7*  --  27.9* 30.2* 32.8*  MCV 84.9  --   --  85.6 84.1  MCH 29.2  --   --  29.2 29.0  MCHC 34.4  --   --  34.1 34.5  RDW 14.6  --   --  14.5 14.6  PLT 240  --   --  249 262   Thyroid No results for input(s): "TSH", "FREET4" in the last 168 hours.  BNP Recent Labs  Lab 02/10/22 1607  BNP 845.7*    DDimer No results for input(s): "DDIMER" in the last 168 hours.   Radiology    CARDIAC CATHETERIZATION  Result Date: 02/12/2022   Mid Cx lesion is 30% stenosed.   Prox RCA lesion is 90% stenosed.   Prox RCA to Mid RCA lesion is 50% stenosed.   Mid RCA lesion is 60% stenosed.   RPDA lesion is 50% stenosed. 1.  Severe one-vessel coronary artery disease involving the right coronary artery.  Patent right coronary artery stents with moderate diffuse in-stent restenosis.  However, there is a 90% stenosis at the proximal edge of the stent with hazy appearance highly suggestive of  plaque rupture.  In addition, there is a 60% distal edge stenosis. 2.  Moderate aortic stenosis with mean gradient of 23 mmHg. 3.  Moderately difficult procedure via the right radial artery due to significant tortuosity of the innominate and right subclavian arteries. Recommendations: 35 mL of contrast was used for today's diagnostic procedure.  Gentle hydration postprocedure. Recommend staged RCA PCI on Friday.  I suspect that the whole length of the lesion within the previously placed stents will need to be treated with another drug-eluting stent. Consider femoral artery access for better support given difficulty engaging the right coronary artery from the right radial artery.    Cardiac Studies   Cath: 02/12/22    Mid Cx lesion is 30% stenosed.   Prox RCA lesion is 90% stenosed.   Prox RCA to Mid RCA lesion is 50% stenosed.   Mid RCA lesion is 60% stenosed.   RPDA lesion is 50% stenosed.   1.  Severe one-vessel coronary artery disease involving the right coronary artery.  Patent right coronary artery stents with moderate diffuse in-stent restenosis.  However, there is a 90% stenosis at the proximal edge of the stent with hazy appearance highly suggestive of plaque rupture.  In addition, there is a 60% distal edge stenosis. 2.  Moderate aortic stenosis with mean gradient of 23 mmHg. 3.  Moderately difficult procedure via the right radial artery due to significant tortuosity of the innominate and right subclavian arteries.   Recommendations: 35 mL of contrast was used for today's diagnostic procedure.  Gentle hydration postprocedure. Recommend staged RCA PCI on Friday.  I suspect that the whole length of the lesion within the previously placed stents will need to be treated with another drug-eluting stent. Consider femoral artery access for better support given difficulty engaging the right coronary artery from the right radial artery.  Diagnostic Dominance: Right  Echo: 02/10/22  1. Left  ventricular ejection fraction, by estimation, is 55 to 60%. The left ventricle has normal function. The left ventricle demonstrates regional wall motion abnormalities (see scoring diagram/findings for description). Left ventricular diastolic parameters are indeterminate. There is mild hypokinesis of the left  ventricular, entire inferior  wall. There is mild hypokinesis of the left ventricular, basal inferolateral wall.   2. Right ventricular systolic function is normal. The right ventricular size is normal. There is normal pulmonary artery systolic pressure.   3. Left atrial size was moderately dilated.   4. The mitral valve is normal in structure. Mild to moderate mitral valve regurgitation. No evidence of mitral stenosis.   5. The aortic valve is calcified. There is moderate calcification of the aortic valve. There is moderate thickening of the aortic valve. Aortic valve regurgitation is not visualized. Moderate aortic valve stenosis.   6. The inferior vena cava is normal in size with greater than 50% respiratory variability, suggesting right atrial pressure of 3 mmHg.   Comparison(s): Prior images reviewed side by side. Changes from prior study are noted. Overall similar LVEF, but current study with mild hypokinesis of the inferior/inferolateral wall as noted.   Conclusion(s)/Recommendation(s): New mild wall motion abnormalities with preserved EF. Moderate aortic stenosis. Sinus bradycardia (as low as 37 bpm) noted throughout study.    Patient Profile     86 y.o. male a hx of CHF, HTN, CKD stage 4, CAD with prior PCI in 2001, aortic stenosis, second degree AV block, Mobitz 1 and 2-1 block, and PAD with prior stenting Right common iliac artery in 2020 => consulted on 02/10/2022 for the evaluation of chest pain/ACS at the request of Dr. Sherral Hammers.   Assessment & Plan    NSTEMI CAD with prior PCI of RCA progressive anginal symptoms and trop to 2800.  Underwent cardiac catheterization noted above on  7/19 with severe one-vessel CAD involving the RCA with patent stents, moderate diffuse in-stent restenosis with new 90% stenosis at proximal edge of stent suggestive of plaque rupture.  Given his renal function he is planned for staged intervention tomorrow after gentle hydration.   -- continue ASA, plavix, statin,  -- no BB with baseline bradycardia    HFpEF: elevated BNP on admission, but does not seem to be volume overloaded. LVEDP 7 on cath.   Hyperlipidemia: LDL 59 -- continue on atorvastatin '80mg'$  daily  Hypertension: elevated yesterday, improved this morning -- continue amlodipine '10mg'$  daily, hydralazine '25mg'$  TID  Bradycardia  Mobitz 1/2 --no BB EP has seen with no plans for device at this time --clonidine stopped   Moderate AS: Moderate aortic stenosis on echo. Aortic valve mean  gradient measures 23.3 mmHg. Aortic valve peak gradient measures 40.6 mmHg. Aortic valve area, by VTI measures 0.99 cm.   CKD stage IV: baseline 2.2-2.5 -- stable at 2.28 today  For questions or updates, please contact [place] Please consult [email or web address] for contact info under        Signed, Reino Bellis, NP  02/13/2022, 8:47 AM     ATTENDING ATTESTATION  I have seen, examined and evaluated the patient this AM along with Reino Bellis, NP-C .  After reviewing all the available data and chart, we discussed the patients laboratory, study & physical findings as well as symptoms in detail. I agree with her findings, examination as well as impression recommendations as per our discussion.    Attending adjustments noted in italics.   Mr. Sauls is doing well.  Very happy that we have a diagnosis.  Renal function is improving creatinine back to his baseline preadmission level.  He thinks and I potentially agree that he benefited from the IV fluids but also the unit of blood.  I think is not unreasonable to consider iron supplementation on discharge.  He is  pending PCI tomorrow.  We will  plan to load with Plavix today 300 mg and dose daily tomorrow. We will hydrate post cath tomorrow anticipate discharge on 02/15/2022    Glenetta Hew, M.D., M.S. Interventional Cardiologist   Pager # [phone] Phone # [phone] [address] [place], [place] [address]

## 2022-02-14 ENCOUNTER — Encounter (HOSPITAL_COMMUNITY): Payer: Self-pay | Admitting: Internal Medicine

## 2022-02-14 ENCOUNTER — Encounter (HOSPITAL_COMMUNITY)
Admission: EM | Disposition: A | Discharge status: Home / Self Care | Payer: Self-pay | Source: Home / Self Care | Attending: Internal Medicine

## 2022-02-14 DIAGNOSIS — I214 Non-ST elevation (NSTEMI) myocardial infarction: Secondary | ICD-10-CM | POA: Diagnosis not present

## 2022-02-14 DIAGNOSIS — R001 Bradycardia, unspecified: Secondary | ICD-10-CM | POA: Diagnosis not present

## 2022-02-14 DIAGNOSIS — R079 Chest pain, unspecified: Secondary | ICD-10-CM | POA: Diagnosis not present

## 2022-02-14 DIAGNOSIS — I2510 Atherosclerotic heart disease of native coronary artery without angina pectoris: Secondary | ICD-10-CM | POA: Diagnosis not present

## 2022-02-14 DIAGNOSIS — I25110 Atherosclerotic heart disease of native coronary artery with unstable angina pectoris: Secondary | ICD-10-CM | POA: Diagnosis not present

## 2022-02-14 DIAGNOSIS — N1832 Chronic kidney disease, stage 3b: Secondary | ICD-10-CM | POA: Diagnosis not present

## 2022-02-14 DIAGNOSIS — I5020 Unspecified systolic (congestive) heart failure: Secondary | ICD-10-CM | POA: Diagnosis not present

## 2022-02-14 HISTORY — PX: CORONARY STENT INTERVENTION: CATH118234

## 2022-02-14 HISTORY — PX: INTRAVASCULAR ULTRASOUND/IVUS: CATH118244

## 2022-02-14 HISTORY — PX: LEFT HEART CATH: CATH118248

## 2022-02-14 LAB — CBC
HCT: 33.6 % — ABNORMAL LOW (ref 39.0–52.0)
Hemoglobin: 11.4 g/dL — ABNORMAL LOW (ref 13.0–17.0)
MCH: 29.2 pg (ref 26.0–34.0)
MCHC: 33.9 g/dL (ref 30.0–36.0)
MCV: 85.9 fL (ref 80.0–100.0)
Platelets: 254 10*3/uL (ref 150–400)
RBC: 3.91 MIL/uL — ABNORMAL LOW (ref 4.22–5.81)
RDW: 14.7 % (ref 11.5–15.5)
WBC: 12.1 10*3/uL — ABNORMAL HIGH (ref 4.0–10.5)
nRBC: 0 % (ref 0.0–0.2)

## 2022-02-14 LAB — COMPREHENSIVE METABOLIC PANEL
ALT: 56 U/L — ABNORMAL HIGH (ref 0–44)
AST: 39 U/L (ref 15–41)
Albumin: 3.2 g/dL — ABNORMAL LOW (ref 3.5–5.0)
Alkaline Phosphatase: 168 U/L — ABNORMAL HIGH (ref 38–126)
Anion gap: 8 (ref 5–15)
BUN: 46 mg/dL — ABNORMAL HIGH (ref 8–23)
CO2: 20 mmol/L — ABNORMAL LOW (ref 22–32)
Calcium: 9.6 mg/dL (ref 8.9–10.3)
Chloride: 108 mmol/L (ref 98–111)
Creatinine, Ser: 2.41 mg/dL — ABNORMAL HIGH (ref 0.61–1.24)
GFR, Estimated: 26 mL/min — ABNORMAL LOW (ref >60)
Glucose, Bld: 124 mg/dL — ABNORMAL HIGH (ref 70–99)
Potassium: 4.2 mmol/L (ref 3.5–5.1)
Sodium: 136 mmol/L (ref 135–145)
Total Bilirubin: 1.1 mg/dL (ref 0.3–1.2)
Total Protein: 6.6 g/dL (ref 6.5–8.1)

## 2022-02-14 LAB — HEPARIN LEVEL (UNFRACTIONATED): Heparin Unfractionated: 0.57 IU/mL (ref 0.30–0.70)

## 2022-02-14 LAB — GLUCOSE, CAPILLARY
Glucose-Capillary: 114 mg/dL — ABNORMAL HIGH (ref 70–99)
Glucose-Capillary: 120 mg/dL — ABNORMAL HIGH (ref 70–99)
Glucose-Capillary: 127 mg/dL — ABNORMAL HIGH (ref 70–99)
Glucose-Capillary: 135 mg/dL — ABNORMAL HIGH (ref 70–99)
Glucose-Capillary: 301 mg/dL — ABNORMAL HIGH (ref 70–99)
Glucose-Capillary: 91 mg/dL (ref 70–99)

## 2022-02-14 LAB — POCT ACTIVATED CLOTTING TIME
Activated Clotting Time: 353 seconds
Activated Clotting Time: 287 seconds
Activated Clotting Time: 167 seconds

## 2022-02-14 LAB — PHOSPHORUS: Phosphorus: 3.7 mg/dL (ref 2.5–4.6)

## 2022-02-14 LAB — MAGNESIUM: Magnesium: 1.7 mg/dL (ref 1.7–2.4)

## 2022-02-14 LAB — LIPOPROTEIN A (LPA): Lipoprotein (a): 226.2 nmol/L — ABNORMAL HIGH (ref <75.0)

## 2022-02-14 SURGERY — CORONARY STENT INTERVENTION
Anesthesia: LOCAL

## 2022-02-14 MED ORDER — HEPARIN (PORCINE) IN NACL 1000-0.9 UT/500ML-% IV SOLN
INTRAVENOUS | Status: DC | PRN
  Administered 2022-02-14 (×2): 500 mL

## 2022-02-14 MED ORDER — HEPARIN SODIUM (PORCINE) 5000 UNIT/ML IJ SOLN
5000.0000 [IU] | Freq: Three times a day (TID) | INTRAMUSCULAR | Status: DC
  Administered 2022-02-15: 5000 [IU] via SUBCUTANEOUS
  Filled 2022-02-14: qty 1

## 2022-02-14 MED ORDER — SODIUM CHLORIDE 0.9% FLUSH: 3.0000 mL | Freq: Two times a day (BID) | INTRAVENOUS | Status: DC

## 2022-02-14 MED ORDER — SODIUM CHLORIDE 0.9 % WEIGHT BASED INFUSION
3.0000 mL/kg/h | INTRAVENOUS | Status: DC
Start: 2022-02-14 — End: 2022-02-14
  Administered 2022-02-14: 3 mL/kg/h via INTRAVENOUS

## 2022-02-14 MED ORDER — SODIUM CHLORIDE 0.9% FLUSH
3.0000 mL | Freq: Two times a day (BID) | INTRAVENOUS | Status: DC
Start: 2022-02-14 — End: 2022-02-15
  Administered 2022-02-15: 3 mL via INTRAVENOUS

## 2022-02-14 MED ORDER — HYDRALAZINE HCL 20 MG/ML IJ SOLN
10.0000 mg | INTRAMUSCULAR | Status: AC | PRN
  Administered 2022-02-14: 10 mg via INTRAVENOUS
  Filled 2022-02-14: qty 1

## 2022-02-14 MED ORDER — SODIUM CHLORIDE 0.9 % IV SOLN: 250.0000 mL | INTRAVENOUS | Status: DC | PRN

## 2022-02-14 MED ORDER — FENTANYL CITRATE (PF) 100 MCG/2ML IJ SOLN
INTRAMUSCULAR | Status: AC
  Filled 2022-02-14: qty 2

## 2022-02-14 MED ORDER — LIDOCAINE HCL (PF) 1 % IJ SOLN
INTRAMUSCULAR | Status: AC
  Filled 2022-02-14: qty 30

## 2022-02-14 MED ORDER — LIDOCAINE HCL (PF) 1 % IJ SOLN
INTRAMUSCULAR | Status: DC | PRN
  Administered 2022-02-14: 10 mL

## 2022-02-14 MED ORDER — SODIUM CHLORIDE 0.9 % IV SOLN: INTRAVENOUS | Status: AC

## 2022-02-14 MED ORDER — SODIUM CHLORIDE 0.9 % WEIGHT BASED INFUSION
1.0000 mL/kg/h | INTRAVENOUS | Status: DC
  Administered 2022-02-14: 1 mL/kg/h via INTRAVENOUS

## 2022-02-14 MED ORDER — ASPIRIN 81 MG PO CHEW
81.0000 mg | CHEWABLE_TABLET | ORAL | Status: AC
  Administered 2022-02-14: 81 mg via ORAL
  Filled 2022-02-14: qty 1

## 2022-02-14 MED ORDER — LABETALOL HCL 5 MG/ML IV SOLN
5.0000 mg | INTRAVENOUS | Status: DC | PRN
Start: 2022-02-14 — End: 2022-02-15
  Administered 2022-02-14: 5 mg via INTRAVENOUS
  Filled 2022-02-14 (×2): qty 4

## 2022-02-14 MED ORDER — MIDAZOLAM HCL 2 MG/2ML IJ SOLN
INTRAMUSCULAR | Status: AC
  Filled 2022-02-14: qty 2

## 2022-02-14 MED ORDER — SODIUM CHLORIDE 0.9 % IV SOLN
INTRAVENOUS | Status: AC | PRN
  Administered 2022-02-14: 1 mg/kg/h via INTRAVENOUS

## 2022-02-14 MED ORDER — ASPIRIN 81 MG PO CHEW: 81.0000 mg | CHEWABLE_TABLET | ORAL | Status: DC

## 2022-02-14 MED ORDER — MIDAZOLAM HCL 2 MG/2ML IJ SOLN
INTRAMUSCULAR | Status: DC | PRN
  Administered 2022-02-14 (×2): 1 mg via INTRAVENOUS

## 2022-02-14 MED ORDER — BIVALIRUDIN BOLUS VIA INFUSION - CUPID
INTRAVENOUS | Status: DC | PRN
  Administered 2022-02-14: 54 mg via INTRAVENOUS

## 2022-02-14 MED ORDER — HEPARIN (PORCINE) IN NACL 1000-0.9 UT/500ML-% IV SOLN
INTRAVENOUS | Status: AC
  Filled 2022-02-14: qty 1000

## 2022-02-14 MED ORDER — FENTANYL CITRATE (PF) 100 MCG/2ML IJ SOLN
INTRAMUSCULAR | Status: DC | PRN
  Administered 2022-02-14 (×2): 25 ug via INTRAVENOUS

## 2022-02-14 MED ORDER — IOHEXOL 350 MG/ML SOLN
INTRAVENOUS | Status: DC | PRN
  Administered 2022-02-14: 60 mL

## 2022-02-14 MED ORDER — SODIUM CHLORIDE 0.9% FLUSH: 3.0000 mL | INTRAVENOUS | Status: DC | PRN

## 2022-02-14 SURGICAL SUPPLY — 27 items
BALL SAPPHIRE NC24 3.5X15 (BALLOONS) ×2
BALL SAPPHIRE NC24 4.0X12 (BALLOONS) ×2
BALLN SCOREFLEX 3.0X15 (BALLOONS) ×2
BALLOON SAPPHIRE NC24 3.5X15 (BALLOONS) IMPLANT
BALLOON SAPPHIRE NC24 4.0X12 (BALLOONS) IMPLANT
BALLOON SCOREFLEX 3.0X15 (BALLOONS) IMPLANT
CATH INFINITI JR4 5F (CATHETERS) ×1 IMPLANT
CATH LAUNCHER 6FR JR4 (CATHETERS) ×1 IMPLANT
CATH OPTICROSS HD (CATHETERS) ×1 IMPLANT
ELECT DEFIB PAD ADLT CADENCE (PAD) ×1 IMPLANT
KIT ENCORE 26 ADVANTAGE (KITS) ×1 IMPLANT
KIT HEART LEFT (KITS) ×2 IMPLANT
KIT MICROPUNCTURE NIT STIFF (SHEATH) ×1 IMPLANT
PACK CARDIAC CATHETERIZATION (CUSTOM PROCEDURE TRAY) ×2 IMPLANT
SHEATH PINNACLE 6F 10CM (SHEATH) ×1 IMPLANT
SHEATH PROBE COVER 6X72 (BAG) ×1 IMPLANT
SLED PULL BACK IVUS (MISCELLANEOUS) ×1 IMPLANT
STENT SYNERGY XD 3.50X12 (Permanent Stent) IMPLANT
STENT SYNERGY XD 3.50X24 (Permanent Stent) IMPLANT
SYNERGY XD 3.50X12 (Permanent Stent) ×2 IMPLANT
SYNERGY XD 3.50X24 (Permanent Stent) ×2 IMPLANT
TRANSDUCER W/STOPCOCK (MISCELLANEOUS) ×2 IMPLANT
TUBING CIL FLEX 10 FLL-RA (TUBING) ×2 IMPLANT
WIRE ASAHI PROWATER 180CM (WIRE) ×1 IMPLANT
WIRE EMERALD 3MM-J .035X150CM (WIRE) ×1 IMPLANT
WIRE HI TORQ VERSACORE-J 145CM (WIRE) ×1 IMPLANT
WIRE RUNTHROUGH .014X180CM (WIRE) ×1 IMPLANT

## 2022-02-14 NOTE — Progress Notes (Signed)
Heart Failure Nurse Navigator Progress Note  PCP: Street, [name], MD PCP-Cardiologist: Bettina Gavia Admission Diagnosis: None listed Admitted from: Home  Presentation:   Troy [name] presented with chest pain on and off for 3 weeks, shortness of breath, and dizzy. Saw his MD on Friday, for consideration of pacer vs cardiac catheterization. which he was given nitro paste to change every 6 hours. His pain worsened and he came to the ER. On admission had episodes of bradycardia, BP 155/99, Troponin 390, a diagnostic catheterization with PCI planned 02/14/2022 once creatine / renal function becomes better.   Patient was educated on sign and symptoms of heart failure, daily weights, diet/ fluid restrictions, when to call his doctor or go to the ER, the importance of taking all medications as prescribed and attending all medical appointments. Paient voiced his understanding and is scheduled for a hospital HF TOC appointment on 03/03/2022 @ 11 am.   ECHO/ LVEF: EF 55-60% HFpEF  Clinical Course:  Past Medical History:  Diagnosis Date   Anemia 12/09/2015   CAD in native artery 12/09/2015   Overview:  PCI/ stent 2001   Chronic diastolic heart failure ([place]) 12/09/2015   Coronary artery disease involving native coronary artery of native heart with angina pectoris ([place]) 12/09/2015   Overview:  PCI/ stent 2001   Hyperlipidemia 12/09/2015   Hypertensive heart disease with heart failure ([place]) 12/09/2015   Necrotizing pancreatitis 06/26/2015   Pseudocyst of pancreas 06/26/2015     Social History   Socioeconomic History   Marital status: Married    Spouse name: [name]   Number of children: 3   Years of education: Not on file   Highest education level: Associate degree: academic program  Occupational History   Occupation: Retired  Tobacco Use   Smoking status: Former    Types: Cigarettes    Passive exposure: Past   Smokeless tobacco: Never  Vaping Use   Vaping Use: Never used  Substance and Sexual  Activity   Alcohol use: No   Drug use: No   Sexual activity: Not on file  Other Topics Concern   Not on file  Social History Narrative   Not on file   Social Determinants of Health   Financial Resource Strain: Low Risk  (02/14/2022)   Overall Financial Resource Strain (CARDIA)    Difficulty of Paying Living Expenses: Not hard at all  Food Insecurity: No Food Insecurity (02/14/2022)   Hunger Vital Sign    Worried About Running Out of Food in the Last Year: Never true    Ran Out of Food in the Last Year: Never true  Transportation Needs: No Transportation Needs (02/14/2022)   PRAPARE - Hydrologist (Medical): No    Lack of Transportation (Non-Medical): No  Physical Activity: Not on file  Stress: Not on file  Social Connections: Not on file    High Risk Criteria for Readmission and/or Poor Patient Outcomes: Heart failure hospital admissions (last 6 months): 0  No Show rate: 8% Difficult social situation: No Demonstrates medication adherence: Yes Primary Language: English Literacy level: Reading, writing, and comprehension.   Barriers of Care:   CKD 4  Diet/ fluid restrictions  Considerations/Referrals:   Referral made to Heart Failure Pharmacist Stewardship: yes Referral made to Heart Failure CSW/NCM TOC: No Referral made to Heart & Vascular TOC clinic: yes, 03/03/2022 @ 11 am.   Items for Follow-up on DC/TOC: Diet/ fluid restrictions Daily weights   Earnestine Leys, BSN, RN Heart  Failure Nurse Navigator Secure Chat Only

## 2022-02-14 NOTE — Progress Notes (Signed)
PROGRESS NOTE    Troy [name]  GNF:[id number] DOB: Jan 31, 1936 DOA: 02/10/2022 PCP: Venetia Maxon, [name], MD    Brief Narrative:  Troy [name] is a 86 y.o. with prior h/o CAD, Mobitz type I (Wenckebach), HTN,, Aortic Stenosis, chronic diastolic CHF, HLD, necrotizing pancreatitis, CKD stage IV  presented with sob, chest pain and dizziness.  He was found to have non-STEMI and currently undergoing staged PCI. Plan for cardiac cath today again.   Assessment & Plan:   Non-ST elevation MI: Recurrent chest pain. Currently remains on aspirin, Plavix, atorvastatin, Remains on IV heparin. Previously on IV nitroglycerin, this is off now. Loaded with Plavix. Underwent cardiac catheterization 7/19 and found to have intact left-sided coronaries. Distal RCA blockage with a ruptured plaque.  Cardiology planning for scheduled multistage PCI today.  Bradycardia with high-grade AV block: In the setting of known Mobitz type I AV block.  Followed by EP cardiology.  Currently not recommending any intervention or pacemaker.  Monitor in the hospital.  Hopefully will improve with improvement of ischemia.  CKD stage IV: At about baseline creatinine.  Continue monitoring with use of contrast.  Remains a stable.  Essential hypertension: On amlodipine and well controlled.  Chronic normocytic anemia: At about baseline.  Type 2 diabetes: Well-controlled.  Remains on insulin.  Hyperlipidemia: On statins.  Elevated transaminases: Unknown cause.  Transiently elevated.  Now stable.    DVT prophylaxis:   Heparin infusion   Code Status: Full code Family Communication: Wife and son at the bedside. Disposition Plan: Status is: Inpatient Remains inpatient appropriate because: Inpatient procedures planned, remains on high risk infusions     Consultants:  Cardiology  Procedures:  Cardiac cath planned  Antimicrobials:  None   Subjective: Seen and examined.  Denies any complaints.  Looking forward  for procedure today.  No more chest pain.  Objective: Vitals:   02/13/22 1157 02/13/22 1200 02/13/22 1950 02/14/22 0439  BP: (!) 156/70 (!) 156/70 (!) 166/64 (!) 169/75  Pulse: 68   85  Resp: '18  16 18  '$ Temp: 97.7 F (36.5 C)  98.4 F (36.9 C) 98.3 F (36.8 C)  TempSrc: Oral  Oral Oral  SpO2: 95%  98% 96%  Weight:    72 kg  Height:        Intake/Output Summary (Last 24 hours) at 02/14/2022 1143 Last data filed at 02/13/2022 2115 Gross per 24 hour  Intake 240 ml  Output --  Net 240 ml    Filed Weights   02/12/22 0415 02/13/22 0532 02/14/22 0439  Weight: 76.3 kg 72.4 kg 72 kg    Examination:  General: Comfortable.  On room air. Cardiovascular: S1-S2 normal.  Regular rate rhythm. Respiratory: Bilateral clear.  No added sounds. Gastrointestinal: Soft.  Bowel sound present. Ext: No deformity.  No cyanosis or edema. Neuro: Intact.      Data Reviewed: I have personally reviewed following labs and imaging studies  CBC: Recent Labs  Lab 02/10/22 0943 02/11/22 0349 02/11/22 2225 02/12/22 0304 02/13/22 0656 02/14/22 0300  WBC 7.2 7.1  --  7.8 9.9 12.1*  HGB 9.3* 8.5* 9.8* 10.3* 11.3* 11.4*  HCT 28.4* 24.7* 27.9* 30.2* 32.8* 33.6*  MCV 86.9 84.9  --  85.6 84.1 85.9  PLT 256 240  --  249 262 846    Basic Metabolic Panel: Recent Labs  Lab 02/10/22 0943 02/10/22 1607 02/11/22 0349 02/12/22 0304 02/13/22 0656 02/14/22 0300  NA 128*  --  133* 136 138 136  K  5.0  --  4.8 4.4 4.4 4.2  CL 98  --  103 105 107 108  CO2 19*  --  21* 20* 21* 20*  GLUCOSE 314*  --  85 72 98 124*  BUN 39*  --  42* 49* 41* 46*  CREATININE 2.54*  --  2.62* 2.57* 2.28* 2.41*  CALCIUM 9.0  --  8.9 9.4 9.5 9.6  MG  --  2.1 2.0 1.9 1.8 1.7  PHOS  --   --  4.6 4.8* 3.8 3.7    GFR: Estimated Creatinine Clearance: 23.2 mL/min (A) (by C-G formula based on SCr of 2.41 mg/dL (H)). Liver Function Tests: Recent Labs  Lab 02/11/22 0349 02/12/22 0304 02/13/22 0656 02/14/22 0300  AST 33  27 83* 39  ALT 21 22 84* 56*  ALKPHOS 76 80 204* 168*  BILITOT 0.6 0.6 1.0 1.1  PROT 5.7* 6.2* 6.9 6.6  ALBUMIN 3.0* 3.1* 3.4* 3.2*    No results for input(s): "LIPASE", "AMYLASE" in the last 168 hours. No results for input(s): "AMMONIA" in the last 168 hours. Coagulation Profile: No results for input(s): "INR", "PROTIME" in the last 168 hours. Cardiac Enzymes: No results for input(s): "CKTOTAL", "CKMB", "CKMBINDEX", "TROPONINI" in the last 168 hours. BNP (last 3 results) No results for input(s): "PROBNP" in the last 8760 hours. HbA1C: No results for input(s): "HGBA1C" in the last 72 hours.  CBG: Recent Labs  Lab 02/13/22 1154 02/13/22 1947 02/14/22 0022 02/14/22 0424 02/14/22 0733  GLUCAP 211* 230* 127* 114* 135*    Lipid Profile: No results for input(s): "CHOL", "HDL", "LDLCALC", "TRIG", "CHOLHDL", "LDLDIRECT" in the last 72 hours.  Thyroid Function Tests: No results for input(s): "TSH", "T4TOTAL", "FREET4", "T3FREE", "THYROIDAB" in the last 72 hours. Anemia Panel: Recent Labs    02/11/22 1541 02/11/22 1603  VITAMINB12  --  1,023*  FOLATE  --  17.8  FERRITIN  --  120  TIBC  --  276  IRON  --  75  RETICCTPCT 1.0  --     Sepsis Labs: No results for input(s): "PROCALCITON", "LATICACIDVEN" in the last 168 hours.  No results found for this or any previous visit (from the past 240 hour(s)).       Radiology Studies: CARDIAC CATHETERIZATION  Result Date: 02/12/2022   Mid Cx lesion is 30% stenosed.   Prox RCA lesion is 90% stenosed.   Prox RCA to Mid RCA lesion is 50% stenosed.   Mid RCA lesion is 60% stenosed.   RPDA lesion is 50% stenosed. 1.  Severe one-vessel coronary artery disease involving the right coronary artery.  Patent right coronary artery stents with moderate diffuse in-stent restenosis.  However, there is a 90% stenosis at the proximal edge of the stent with hazy appearance highly suggestive of plaque rupture.  In addition, there is a 60% distal  edge stenosis. 2.  Moderate aortic stenosis with mean gradient of 23 mmHg. 3.  Moderately difficult procedure via the right radial artery due to significant tortuosity of the innominate and right subclavian arteries. Recommendations: 35 mL of contrast was used for today's diagnostic procedure.  Gentle hydration postprocedure. Recommend staged RCA PCI on Friday.  I suspect that the whole length of the lesion within the previously placed stents will need to be treated with another drug-eluting stent. Consider femoral artery access for better support given difficulty engaging the right coronary artery from the right radial artery.        Scheduled Meds:  amLODipine  10 mg  Oral Daily   aspirin EC  81 mg Oral Daily   atorvastatin  80 mg Oral QHS   [START ON 02/15/2022] clopidogrel  75 mg Oral Daily   hydrALAZINE  25 mg Oral Q8H   insulin aspart  0-9 Units Subcutaneous Q4H   insulin glargine-yfgn  10 Units Subcutaneous Daily   lipase/protease/amylase  24,000 Units Oral TID AC   sodium chloride flush  3 mL Intravenous Q12H   sodium chloride flush  3 mL Intravenous Q12H   sodium chloride flush  3 mL Intravenous Q12H   sodium chloride flush  3 mL Intravenous Q12H   Continuous Infusions:  sodium chloride     sodium chloride     sodium chloride     sodium chloride 1 mL/kg/hr (02/14/22 0813)   heparin 1,400 Units/hr (02/13/22 1857)   nitroGLYCERIN Stopped (02/11/22 0728)     LOS: 3 days    Time spent: 35 minutes    Barb Merino, MD [facility] Pager [phone]

## 2022-02-14 NOTE — Progress Notes (Signed)
68f sheath aspirated and removed from right femoral artery. Manual pressure applied for 20 minutes. Site level 0, no s+s of hematoma. Tegaderm dressing applied, bedrest instructions given. `  Bilateral dp and pt pulses palpable.   Bedrest begins at 20:30:00

## 2022-02-14 NOTE — Progress Notes (Addendum)
Progress Note  Patient Name: Troy [name] Date of Encounter: 02/14/2022  [facility] Cardiologist: Shirlee More, MD   Subjective   No complaints. Planned for cardiac cath today.   Inpatient Medications    Scheduled Meds:  amLODipine  10 mg Oral Daily   aspirin EC  81 mg Oral Daily   atorvastatin  80 mg Oral QHS   [START ON 02/15/2022] clopidogrel  75 mg Oral Daily   hydrALAZINE  25 mg Oral Q8H   insulin aspart  0-9 Units Subcutaneous Q4H   insulin glargine-yfgn  10 Units Subcutaneous Daily   lipase/protease/amylase  24,000 Units Oral TID AC   sodium chloride flush  3 mL Intravenous Q12H   sodium chloride flush  3 mL Intravenous Q12H   sodium chloride flush  3 mL Intravenous Q12H   sodium chloride flush  3 mL Intravenous Q12H   Continuous Infusions:  sodium chloride     sodium chloride     sodium chloride     sodium chloride 1 mL/kg/hr (02/14/22 0813)   heparin 1,400 Units/hr (02/13/22 1857)   nitroGLYCERIN Stopped (02/11/22 0728)   PRN Meds: sodium chloride, sodium chloride, sodium chloride, acetaminophen, nitroGLYCERIN, ondansetron (ZOFRAN) IV, sodium chloride flush, sodium chloride flush, sodium chloride flush   Vital Signs    Vitals:   02/13/22 1157 02/13/22 1200 02/13/22 1950 02/14/22 0439  BP: (!) 156/70 (!) 156/70 (!) 166/64 (!) 169/75  Pulse: 68   85  Resp: '18  16 18  '$ Temp: 97.7 F (36.5 C)  98.4 F (36.9 C) 98.3 F (36.8 C)  TempSrc: Oral  Oral Oral  SpO2: 95%  98% 96%  Weight:    72 kg  Height:        Intake/Output Summary (Last 24 hours) at 02/14/2022 0940 Last data filed at 02/13/2022 2115 Gross per 24 hour  Intake 240 ml  Output --  Net 240 ml      02/14/2022    4:39 AM 02/13/2022    5:32 AM 02/12/2022    4:15 AM  Last 3 Weights  Weight (lbs) 158 lb 12.8 oz 159 lb 9.6 oz 168 lb 3.2 oz  Weight (kg) 72.031 kg 72.394 kg 76.295 kg      Telemetry    Sinus bradycardia - Personally Reviewed  ECG    No new tracing this  morning  Physical Exam   GEN: No acute distress.   Neck: No JVD Cardiac: RRR, 2/6 systolic murmur, no rubs, or gallops.  Respiratory: Clear to auscultation bilaterally. GI: Soft, nontender, non-distended  MS: No edema; No deformity. Neuro:  Nonfocal  Psych: Normal affect   Labs    High Sensitivity Troponin:   Recent Labs  Lab 02/10/22 0943 02/10/22 1132 02/10/22 1607 02/10/22 1745 02/10/22 2039  TROPONINIHS 390* 549* 2,109* 2,800* 2,703*     Chemistry Recent Labs  Lab 02/12/22 0304 02/13/22 0656 02/14/22 0300  NA 136 138 136  K 4.4 4.4 4.2  CL 105 107 108  CO2 20* 21* 20*  GLUCOSE 72 98 124*  BUN 49* 41* 46*  CREATININE 2.57* 2.28* 2.41*  CALCIUM 9.4 9.5 9.6  MG 1.9 1.8 1.7  PROT 6.2* 6.9 6.6  ALBUMIN 3.1* 3.4* 3.2*  AST 27 83* 39  ALT 22 84* 56*  ALKPHOS 80 204* 168*  BILITOT 0.6 1.0 1.1  GFRNONAA 24* 28* 26*  ANIONGAP '11 10 8    '$ Lipids  Recent Labs  Lab 02/10/22 1607  CHOL 102  TRIG 21  HDL 39*  LDLCALC 59  CHOLHDL 2.6    Hematology Recent Labs  Lab 02/12/22 0304 02/13/22 0656 02/14/22 0300  WBC 7.8 9.9 12.1*  RBC 3.53* 3.90* 3.91*  HGB 10.3* 11.3* 11.4*  HCT 30.2* 32.8* 33.6*  MCV 85.6 84.1 85.9  MCH 29.2 29.0 29.2  MCHC 34.1 34.5 33.9  RDW 14.5 14.6 14.7  PLT 249 262 254   Thyroid No results for input(s): "TSH", "FREET4" in the last 168 hours.  BNP Recent Labs  Lab 02/10/22 1607  BNP 845.7*    DDimer No results for input(s): "DDIMER" in the last 168 hours.   Radiology    CARDIAC CATHETERIZATION  Result Date: 02/12/2022   Mid Cx lesion is 30% stenosed.   Prox RCA lesion is 90% stenosed.   Prox RCA to Mid RCA lesion is 50% stenosed.   Mid RCA lesion is 60% stenosed.   RPDA lesion is 50% stenosed. 1.  Severe one-vessel coronary artery disease involving the right coronary artery.  Patent right coronary artery stents with moderate diffuse in-stent restenosis.  However, there is a 90% stenosis at the proximal edge of the stent with  hazy appearance highly suggestive of plaque rupture.  In addition, there is a 60% distal edge stenosis. 2.  Moderate aortic stenosis with mean gradient of 23 mmHg. 3.  Moderately difficult procedure via the right radial artery due to significant tortuosity of the innominate and right subclavian arteries. Recommendations: 35 mL of contrast was used for today's diagnostic procedure.  Gentle hydration postprocedure. Recommend staged RCA PCI on Friday.  I suspect that the whole length of the lesion within the previously placed stents will need to be treated with another drug-eluting stent. Consider femoral artery access for better support given difficulty engaging the right coronary artery from the right radial artery.    Cardiac Studies   Cath: 02/12/22     Mid Cx lesion is 30% stenosed.   Prox RCA lesion is 90% stenosed.   Prox RCA to Mid RCA lesion is 50% stenosed.   Mid RCA lesion is 60% stenosed.   RPDA lesion is 50% stenosed.   1.  Severe one-vessel coronary artery disease involving the right coronary artery.  Patent right coronary artery stents with moderate diffuse in-stent restenosis.  However, there is a 90% stenosis at the proximal edge of the stent with hazy appearance highly suggestive of plaque rupture.  In addition, there is a 60% distal edge stenosis. 2.  Moderate aortic stenosis with mean gradient of 23 mmHg. 3.  Moderately difficult procedure via the right radial artery due to significant tortuosity of the innominate and right subclavian arteries.   Recommendations: 35 mL of contrast was used for today's diagnostic procedure.  Gentle hydration postprocedure. Recommend staged RCA PCI on Friday.  I suspect that the whole length of the lesion within the previously placed stents will need to be treated with another drug-eluting stent. Consider femoral artery access for better support given difficulty engaging the right coronary artery from the right radial artery.    Diagnostic Dominance: Right  Echo: 02/10/22  1. Left ventricular ejection fraction, by estimation, is 55 to 60%. The left ventricle has normal function. The left ventricle demonstrates regional wall motion abnormalities (see scoring diagram/findings for description). Left ventricular diastolic parameters are indeterminate. There is mild hypokinesis of the left  ventricular, entire inferior wall. There is mild hypokinesis of the left ventricular, basal inferolateral wall.   2. Right ventricular systolic function is normal. The right  ventricular size is normal. There is normal pulmonary artery systolic pressure.   3. Left atrial size was moderately dilated.   4. The mitral valve is normal in structure. Mild to moderate mitral valve regurgitation. No evidence of mitral stenosis.   5. The aortic valve is calcified. There is moderate calcification of the aortic valve. There is moderate thickening of the aortic valve. Aortic valve regurgitation is not visualized. Moderate aortic valve stenosis.   6. The inferior vena cava is normal in size with greater than 50% respiratory variability, suggesting right atrial pressure of 3 mmHg.   Comparison(s): Prior images reviewed side by side. Changes from prior study are noted. Overall similar LVEF, but current study with mild hypokinesis of the inferior/inferolateral wall as noted.   Conclusion(s)/Recommendation(s): New mild wall motion abnormalities with preserved EF. Moderate aortic stenosis. Sinus bradycardia (as low as 37 bpm) noted throughout study.    Patient Profile     86 y.o. male  with a hx of CHF, HTN, CKD stage 4, CAD with prior PCI in 2001, aortic stenosis, second degree AV block, Mobitz 1 and 2-1 block, and PAD with prior stenting Right common iliac artery in 2020 => consulted on 02/10/2022 for the evaluation of chest pain/ACS at the request of Dr. Sherral Hammers.   Assessment & Plan    NSTEMI CAD with prior PCI of RCA progressive anginal symptoms and  trop to 2800.  Underwent cardiac catheterization noted above on 7/19 with severe one-vessel CAD involving the RCA with patent stents, moderate diffuse in-stent restenosis with new 90% stenosis at proximal edge of stent suggestive of plaque rupture. Planned for staged intervention today. -- continue ASA, plavix, statin,  -- no BB with baseline bradycardia    HFpEF: elevated BNP on admission, but does not seem to be volume overloaded. LVEDP 7 on cath.    Hyperlipidemia: LDL 59 -- continue on atorvastatin '80mg'$  daily   Hypertension: elevated yesterday, improved this morning -- continue amlodipine '10mg'$  daily, hydralazine '25mg'$  TID   Bradycardia  Mobitz 1/2 -- no BB EP has seen with no plans for device at this time -- clonidine stopped   Moderate AS: Moderate aortic stenosis on echo. Aortic valve mean  gradient measures 23.3 mmHg. Aortic valve peak gradient measures 40.6 mmHg. Aortic valve area, by VTI measures 0.99 cm.    CKD stage IV: baseline 2.2-2.5 -- stable at 2.28>>2.41 today  For questions or updates, please contact [facility] Please consult [email or web address] for contact info under        Signed, Reino Bellis, NP  02/14/2022, 9:40 AM    ATTENDING ATTESTATION  I have seen, examined and evaluated the patient this morning on rounds along with Reino Bellis, NP.  After reviewing all the available data and chart, we discussed the patients laboratory, study & physical findings as well as symptoms in detail. I agree with her findings, examination as well as impression recommendations as per our discussion.     Stable overnight.  Renal function relatively stable.  Not quite as nice as yesterday, still okay.  Plan is to continue with staged PCI of the RCA.  The fact that is all mostly in-stent would make it more doable with minimal contrast.  Would also use IVUS guidance.  This will allow [place] to determine how long the stent would be.  He is on for catheterization and PCI with Dr.  Saunders Revel.  We will plan femoral access for ease of guide catheter placement and to minimize contrast use.  Shared Decision Making/Informed Consent The risks [stroke (1 in 1000), death (1 in 1000), kidney failure [usually temporary] (1 in 500), bleeding (1 in 200), allergic reaction [possibly serious] (1 in 200)], benefits (diagnostic support and management of coronary artery disease) and alternatives of a cardiac catheterization /PCI were discussed in detail with Mr. Goeden and he is willing to proceed.     Glenetta Hew, M.D., M.S. Interventional Cardiologist   Pager # [phone] Phone # [phone] [address] [place], [place] [address]

## 2022-02-14 NOTE — H&P (View-Only) (Signed)
Progress Note  Patient Name: Troy [name] Date of Encounter: 02/14/2022  [facility] Cardiologist: Shirlee More, MD   Subjective   No complaints. Planned for cardiac cath today.   Inpatient Medications    Scheduled Meds:  amLODipine  10 mg Oral Daily   aspirin EC  81 mg Oral Daily   atorvastatin  80 mg Oral QHS   [START ON 02/15/2022] clopidogrel  75 mg Oral Daily   hydrALAZINE  25 mg Oral Q8H   insulin aspart  0-9 Units Subcutaneous Q4H   insulin glargine-yfgn  10 Units Subcutaneous Daily   lipase/protease/amylase  24,000 Units Oral TID AC   sodium chloride flush  3 mL Intravenous Q12H   sodium chloride flush  3 mL Intravenous Q12H   sodium chloride flush  3 mL Intravenous Q12H   sodium chloride flush  3 mL Intravenous Q12H   Continuous Infusions:  sodium chloride     sodium chloride     sodium chloride     sodium chloride 1 mL/kg/hr (02/14/22 0813)   heparin 1,400 Units/hr (02/13/22 1857)   nitroGLYCERIN Stopped (02/11/22 0728)   PRN Meds: sodium chloride, sodium chloride, sodium chloride, acetaminophen, nitroGLYCERIN, ondansetron (ZOFRAN) IV, sodium chloride flush, sodium chloride flush, sodium chloride flush   Vital Signs    Vitals:   02/13/22 1157 02/13/22 1200 02/13/22 1950 02/14/22 0439  BP: (!) 156/70 (!) 156/70 (!) 166/64 (!) 169/75  Pulse: 68   85  Resp: '18  16 18  '$ Temp: 97.7 F (36.5 C)  98.4 F (36.9 C) 98.3 F (36.8 C)  TempSrc: Oral  Oral Oral  SpO2: 95%  98% 96%  Weight:    72 kg  Height:        Intake/Output Summary (Last 24 hours) at 02/14/2022 0940 Last data filed at 02/13/2022 2115 Gross per 24 hour  Intake 240 ml  Output --  Net 240 ml      02/14/2022    4:39 AM 02/13/2022    5:32 AM 02/12/2022    4:15 AM  Last 3 Weights  Weight (lbs) 158 lb 12.8 oz 159 lb 9.6 oz 168 lb 3.2 oz  Weight (kg) 72.031 kg 72.394 kg 76.295 kg      Telemetry    Sinus bradycardia - Personally Reviewed  ECG    No new tracing this  morning  Physical Exam   GEN: No acute distress.   Neck: No JVD Cardiac: RRR, 2/6 systolic murmur, no rubs, or gallops.  Respiratory: Clear to auscultation bilaterally. GI: Soft, nontender, non-distended  MS: No edema; No deformity. Neuro:  Nonfocal  Psych: Normal affect   Labs    High Sensitivity Troponin:   Recent Labs  Lab 02/10/22 0943 02/10/22 1132 02/10/22 1607 02/10/22 1745 02/10/22 2039  TROPONINIHS 390* 549* 2,109* 2,800* 2,703*     Chemistry Recent Labs  Lab 02/12/22 0304 02/13/22 0656 02/14/22 0300  NA 136 138 136  K 4.4 4.4 4.2  CL 105 107 108  CO2 20* 21* 20*  GLUCOSE 72 98 124*  BUN 49* 41* 46*  CREATININE 2.57* 2.28* 2.41*  CALCIUM 9.4 9.5 9.6  MG 1.9 1.8 1.7  PROT 6.2* 6.9 6.6  ALBUMIN 3.1* 3.4* 3.2*  AST 27 83* 39  ALT 22 84* 56*  ALKPHOS 80 204* 168*  BILITOT 0.6 1.0 1.1  GFRNONAA 24* 28* 26*  ANIONGAP '11 10 8    '$ Lipids  Recent Labs  Lab 02/10/22 1607  CHOL 102  TRIG 21  HDL 39*  LDLCALC 59  CHOLHDL 2.6    Hematology Recent Labs  Lab 02/12/22 0304 02/13/22 0656 02/14/22 0300  WBC 7.8 9.9 12.1*  RBC 3.53* 3.90* 3.91*  HGB 10.3* 11.3* 11.4*  HCT 30.2* 32.8* 33.6*  MCV 85.6 84.1 85.9  MCH 29.2 29.0 29.2  MCHC 34.1 34.5 33.9  RDW 14.5 14.6 14.7  PLT 249 262 254   Thyroid No results for input(s): "TSH", "FREET4" in the last 168 hours.  BNP Recent Labs  Lab 02/10/22 1607  BNP 845.7*    DDimer No results for input(s): "DDIMER" in the last 168 hours.   Radiology    CARDIAC CATHETERIZATION  Result Date: 02/12/2022   Mid Cx lesion is 30% stenosed.   Prox RCA lesion is 90% stenosed.   Prox RCA to Mid RCA lesion is 50% stenosed.   Mid RCA lesion is 60% stenosed.   RPDA lesion is 50% stenosed. 1.  Severe one-vessel coronary artery disease involving the right coronary artery.  Patent right coronary artery stents with moderate diffuse in-stent restenosis.  However, there is a 90% stenosis at the proximal edge of the stent with  hazy appearance highly suggestive of plaque rupture.  In addition, there is a 60% distal edge stenosis. 2.  Moderate aortic stenosis with mean gradient of 23 mmHg. 3.  Moderately difficult procedure via the right radial artery due to significant tortuosity of the innominate and right subclavian arteries. Recommendations: 35 mL of contrast was used for today's diagnostic procedure.  Gentle hydration postprocedure. Recommend staged RCA PCI on Friday.  I suspect that the whole length of the lesion within the previously placed stents will need to be treated with another drug-eluting stent. Consider femoral artery access for better support given difficulty engaging the right coronary artery from the right radial artery.    Cardiac Studies   Cath: 02/12/22     Mid Cx lesion is 30% stenosed.   Prox RCA lesion is 90% stenosed.   Prox RCA to Mid RCA lesion is 50% stenosed.   Mid RCA lesion is 60% stenosed.   RPDA lesion is 50% stenosed.   1.  Severe one-vessel coronary artery disease involving the right coronary artery.  Patent right coronary artery stents with moderate diffuse in-stent restenosis.  However, there is a 90% stenosis at the proximal edge of the stent with hazy appearance highly suggestive of plaque rupture.  In addition, there is a 60% distal edge stenosis. 2.  Moderate aortic stenosis with mean gradient of 23 mmHg. 3.  Moderately difficult procedure via the right radial artery due to significant tortuosity of the innominate and right subclavian arteries.   Recommendations: 35 mL of contrast was used for today's diagnostic procedure.  Gentle hydration postprocedure. Recommend staged RCA PCI on Friday.  I suspect that the whole length of the lesion within the previously placed stents will need to be treated with another drug-eluting stent. Consider femoral artery access for better support given difficulty engaging the right coronary artery from the right radial artery.    Diagnostic Dominance: Right  Echo: 02/10/22  1. Left ventricular ejection fraction, by estimation, is 55 to 60%. The left ventricle has normal function. The left ventricle demonstrates regional wall motion abnormalities (see scoring diagram/findings for description). Left ventricular diastolic parameters are indeterminate. There is mild hypokinesis of the left  ventricular, entire inferior wall. There is mild hypokinesis of the left ventricular, basal inferolateral wall.   2. Right ventricular systolic function is normal. The right  ventricular size is normal. There is normal pulmonary artery systolic pressure.   3. Left atrial size was moderately dilated.   4. The mitral valve is normal in structure. Mild to moderate mitral valve regurgitation. No evidence of mitral stenosis.   5. The aortic valve is calcified. There is moderate calcification of the aortic valve. There is moderate thickening of the aortic valve. Aortic valve regurgitation is not visualized. Moderate aortic valve stenosis.   6. The inferior vena cava is normal in size with greater than 50% respiratory variability, suggesting right atrial pressure of 3 mmHg.   Comparison(s): Prior images reviewed side by side. Changes from prior study are noted. Overall similar LVEF, but current study with mild hypokinesis of the inferior/inferolateral wall as noted.   Conclusion(s)/Recommendation(s): New mild wall motion abnormalities with preserved EF. Moderate aortic stenosis. Sinus bradycardia (as low as 37 bpm) noted throughout study.    Patient Profile     86 y.o. male  with a hx of CHF, HTN, CKD stage 4, CAD with prior PCI in 2001, aortic stenosis, second degree AV block, Mobitz 1 and 2-1 block, and PAD with prior stenting Right common iliac artery in 2020 => consulted on 02/10/2022 for the evaluation of chest pain/ACS at the request of Dr. Sherral Hammers.   Assessment & Plan    NSTEMI CAD with prior PCI of RCA progressive anginal symptoms and  trop to 2800.  Underwent cardiac catheterization noted above on 7/19 with severe one-vessel CAD involving the RCA with patent stents, moderate diffuse in-stent restenosis with new 90% stenosis at proximal edge of stent suggestive of plaque rupture. Planned for staged intervention today. -- continue ASA, plavix, statin,  -- no BB with baseline bradycardia    HFpEF: elevated BNP on admission, but does not seem to be volume overloaded. LVEDP 7 on cath.    Hyperlipidemia: LDL 59 -- continue on atorvastatin '80mg'$  daily   Hypertension: elevated yesterday, improved this morning -- continue amlodipine '10mg'$  daily, hydralazine '25mg'$  TID   Bradycardia  Mobitz 1/2 -- no BB EP has seen with no plans for device at this time -- clonidine stopped   Moderate AS: Moderate aortic stenosis on echo. Aortic valve mean  gradient measures 23.3 mmHg. Aortic valve peak gradient measures 40.6 mmHg. Aortic valve area, by VTI measures 0.99 cm.    CKD stage IV: baseline 2.2-2.5 -- stable at 2.28>>2.41 today  For questions or updates, please contact [place] Please consult [email or web address] for contact info under        Signed, Reino Bellis, NP  02/14/2022, 9:40 AM    ATTENDING ATTESTATION  I have seen, examined and evaluated the patient this morning on rounds along with Reino Bellis, NP.  After reviewing all the available data and chart, we discussed the patients laboratory, study & physical findings as well as symptoms in detail. I agree with her findings, examination as well as impression recommendations as per our discussion.     Stable overnight.  Renal function relatively stable.  Not quite as nice as yesterday, still okay.  Plan is to continue with staged PCI of the RCA.  The fact that is all mostly in-stent would make it more doable with minimal contrast.  Would also use IVUS guidance.  This will allow [place] to determine how long the stent would be.  He is on for catheterization and PCI with Dr.  Saunders Revel.  We will plan femoral access for ease of guide catheter placement and to minimize contrast use.  Shared Decision Making/Informed Consent The risks [stroke (1 in 1000), death (1 in 1000), kidney failure [usually temporary] (1 in 500), bleeding (1 in 200), allergic reaction [possibly serious] (1 in 200)], benefits (diagnostic support and management of coronary artery disease) and alternatives of a cardiac catheterization /PCI were discussed in detail with Mr. Hanton and he is willing to proceed.     Glenetta Hew, M.D., M.S. Interventional Cardiologist   Pager # [phone] Phone # [phone] [address]. [place], [place] [address]

## 2022-02-14 NOTE — Progress Notes (Signed)
ANTICOAGULATION CONSULT NOTE  Pharmacy Consult for Heparin infusion Indication: chest pain/ACS  No Known Allergies  Patient Measurements: Height: '5\' 9"'$  (175.3 cm) Weight: 72 kg (158 lb 12.8 oz) IBW/kg (Calculated) : 70.7 Heparin Dosing Weight: 76.4 kg  Vital Signs: Temp: 98.3 F (36.8 C) (07/21 0439) Temp Source: Oral (07/21 0439) BP: 169/75 (07/21 0439) Pulse Rate: 85 (07/21 0439)  Labs: Recent Labs    02/12/22 0304 02/13/22 0656 02/13/22 1801 02/14/22 0300  HGB 10.3* 11.3*  --  11.4*  HCT 30.2* 32.8*  --  33.6*  PLT 249 262  --  254  HEPARINUNFRC 0.40 0.15* 0.49 0.57  CREATININE 2.57* 2.28*  --  2.41*    Estimated Creatinine Clearance: 23.2 mL/min (A) (by C-G formula based on SCr of 2.41 mg/dL (H)).  Assessment: 86 yo M admitted for chest pain x3 wks. Pain is off/on and worsens with exertion. Endorses SOB and dizziness. Patient was not taking any oral anticoagulants prior to admission. Pharmacy consulted to dose heparin infusion for chest pain/ACS protocol.  Heparin level therapeutic, CBC stable, staged PCI planned today.  Goal of Therapy:  Heparin level 0.3-0.7 units/ml Monitor platelets by anticoagulation protocol: Yes   Plan:  Continue heparin 1400 units/hr Daily heparin level and CBC  Arrie Senate, PharmD, [place], [facility] Clinical Pharmacist [phone] Please check AMION for all [facility] Pharmacy numbers 02/14/2022

## 2022-02-14 NOTE — Interval H&P Note (Signed)
History and Physical Interval Note:  02/14/2022 2:38 PM  Troy [name]  has presented today for surgery, with the diagnosis of NSTEMI.  The various methods of treatment have been discussed with the patient and family. After consideration of risks, benefits and other options for treatment, the patient has consented to  Procedure(s): CORONARY STENT INTERVENTION (N/A) as a surgical intervention.  The patient's history has been reviewed, patient examined, no change in status, stable for surgery.  I have reviewed the patient's chart and labs.  Questions were answered to the patient's satisfaction.    Cath Lab Visit (complete for each Cath Lab visit)  Clinical Evaluation Leading to the Procedure:   ACS: Yes.    Non-ACS:  N/A  Emily Forse

## 2022-02-14 NOTE — Care Management Important Message (Signed)
Important Message  Patient Details  Name: Troy [name] MRN: [id number] Date of Birth: 1935/10/21   Medicare Important Message Given:  Yes     Shelda Altes 02/14/2022, 9:55 AM

## 2022-02-15 DIAGNOSIS — I214 Non-ST elevation (NSTEMI) myocardial infarction: Secondary | ICD-10-CM | POA: Diagnosis not present

## 2022-02-15 LAB — MAGNESIUM: Magnesium: 1.8 mg/dL (ref 1.7–2.4)

## 2022-02-15 LAB — CBC
HCT: 35.5 % — ABNORMAL LOW (ref 39.0–52.0)
Hemoglobin: 12 g/dL — ABNORMAL LOW (ref 13.0–17.0)
MCH: 29.5 pg (ref 26.0–34.0)
MCHC: 33.8 g/dL (ref 30.0–36.0)
MCV: 87.2 fL (ref 80.0–100.0)
Platelets: 264 10*3/uL (ref 150–400)
RBC: 4.07 MIL/uL — ABNORMAL LOW (ref 4.22–5.81)
RDW: 14.7 % (ref 11.5–15.5)
WBC: 13.9 10*3/uL — ABNORMAL HIGH (ref 4.0–10.5)
nRBC: 0 % (ref 0.0–0.2)

## 2022-02-15 LAB — COMPREHENSIVE METABOLIC PANEL
ALT: 45 U/L — ABNORMAL HIGH (ref 0–44)
AST: 40 U/L (ref 15–41)
Albumin: 3.4 g/dL — ABNORMAL LOW (ref 3.5–5.0)
Alkaline Phosphatase: 148 U/L — ABNORMAL HIGH (ref 38–126)
Anion gap: 10 (ref 5–15)
BUN: 39 mg/dL — ABNORMAL HIGH (ref 8–23)
CO2: 18 mmol/L — ABNORMAL LOW (ref 22–32)
Calcium: 9.2 mg/dL (ref 8.9–10.3)
Chloride: 107 mmol/L (ref 98–111)
Creatinine, Ser: 2.08 mg/dL — ABNORMAL HIGH (ref 0.61–1.24)
GFR, Estimated: 31 mL/min — ABNORMAL LOW (ref >60)
Glucose, Bld: 155 mg/dL — ABNORMAL HIGH (ref 70–99)
Potassium: 3.9 mmol/L (ref 3.5–5.1)
Sodium: 135 mmol/L (ref 135–145)
Total Bilirubin: 1 mg/dL (ref 0.3–1.2)
Total Protein: 7 g/dL (ref 6.5–8.1)

## 2022-02-15 LAB — GLUCOSE, CAPILLARY
Glucose-Capillary: 141 mg/dL — ABNORMAL HIGH (ref 70–99)
Glucose-Capillary: 218 mg/dL — ABNORMAL HIGH (ref 70–99)
Glucose-Capillary: 158 mg/dL — ABNORMAL HIGH (ref 70–99)

## 2022-02-15 LAB — PHOSPHORUS: Phosphorus: 3.5 mg/dL (ref 2.5–4.6)

## 2022-02-15 MED ORDER — ASPIRIN 81 MG PO TBEC: 81.0000 mg | DELAYED_RELEASE_TABLET | Freq: Every day | ORAL | 12 refills | Status: DC

## 2022-02-15 NOTE — Discharge Summary (Signed)
Physician Discharge Summary  Troy [name] CBJ:[id number] DOB: June 17, 1936 DOA: 02/10/2022  PCP: Emmaline Kluver, MD  Admit date: 02/10/2022 Discharge date: 02/15/2022  Admitted From: Home Disposition: Home  Recommendations for Outpatient Follow-up:  Follow up with PCP in 1-2 weeks Please obtain BMP/CBC in one week Cardiology will schedule follow-up.  Referral to cardiac rehab.  Home Health: N/A Equipment/Devices: N/A  Discharge Condition: Stable CODE STATUS: Full code Diet recommendation: Low-salt and low-carb diet  Discharge summary: 86 year old with history of essential hypertension and stage IV CKD, coronary artery disease with previous PCI, peripheral arterial disease presented with chest pain that was recurrent.  Underwent staged cardiac cath with PCI.  Left heart cath on 7/19 with severe ISR to RCA stent.  Successful PCI to RCA with 2 overlapping stent 7/21.  Stayed in the hospital to stabilize his renal functions and monitoring medication management.  Did fairly well.  Renal functions remained stable after contrasted studies.  Non-STEMI: Aspirin, Plavix, high intensity statin.  As needed nitroglycerin.  Currently chest pain-free.  Cardiac rehab. Unable to start on beta-blockers due to variable heart block.  Currently recommended against pacemaker. CKD stage IV, renal functions remain overall stable. Blood pressures are stable on multiple antihypertensives including amlodipine, lisinopril, clonidine, doxazosin. Blood sugars are well controlled on insulin doses. Transiently elevated transaminases, stabilized and normalized.   Stable for discharge.   Discharge Diagnoses:  Principal Problem:   NSTEMI (non-ST elevated myocardial infarction) ([place]) Active Problems:   Anemia   Coronary artery disease involving native coronary artery of native heart with unstable angina pectoris (HCC)   HFrEF (heart failure with reduced ejection fraction) (HCC) => combination hypertensive  heart disease and CAD related   Hyperlipidemia associated with type 2 diabetes mellitus (HCC)   Bradycardia   Type 2 diabetes mellitus with stage 4 chronic kidney disease, with long-term current use of insulin (HCC)   CKD (chronic kidney disease) stage 4, GFR 15-29 ml/min [facility])    Discharge Instructions  Discharge Instructions     AMB Referral to Cardiac Rehabilitation - Phase II   Complete by: As directed    Diagnosis:  Coronary Stents NSTEMI     After initial evaluation and assessments completed: Virtual Based Care may be provided alone or in conjunction with Phase 2 Cardiac Rehab based on patient barriers.: Yes   Amb Referral to Cardiac Rehabilitation   Complete by: As directed    Referred to [place] [place] cardiac rehab   Diagnosis:  Coronary Stents NSTEMI     After initial evaluation and assessments completed: Virtual Based Care may be provided alone or in conjunction with Phase 2 Cardiac Rehab based on patient barriers.: Yes   Diet - low sodium heart healthy   Complete by: As directed    Diet Carb Modified   Complete by: As directed    Increase activity slowly   Complete by: As directed       Allergies as of 02/15/2022   No Known Allergies      Medication List     TAKE these medications    acetaminophen 500 MG tablet Commonly known as: TYLENOL Take 500-1,000 mg by mouth every 6 (six) hours as needed for moderate pain.   amLODipine 5 MG tablet Commonly known as: NORVASC Take 1 tablet (5 mg total) by mouth in the morning and at bedtime.   aspirin EC 81 MG tablet Take 1 tablet (81 mg total) by mouth daily. Swallow whole. Start taking on: February 16, 2022  atorvastatin 80 MG tablet Commonly known as: LIPITOR TAKE 1 TABLET EACH EVENING What changed: See the new instructions.   Cinnamon 500 MG capsule Take 500 mg by mouth daily.   cloNIDine 0.1 MG tablet Commonly known as: CATAPRES Take 1 tablet (0.1 mg total) by mouth 2 (two) times daily.   clopidogrel  75 MG tablet Commonly known as: PLAVIX Take 75 mg by mouth daily.   co-enzyme Q-10 30 MG capsule Take 30 mg by mouth daily.   Creon 24000-76000 units Cpep Generic drug: Pancrelipase (Lip-Prot-Amyl) Take 1 capsule by mouth in the morning, at noon, and at bedtime.   doxazosin 2 MG tablet Commonly known as: CARDURA Take 2 mg by mouth at bedtime.   EYE VITAMINS PO Take 1 tablet by mouth daily.   hydrALAZINE 25 MG tablet Commonly known as: APRESOLINE Take one tablet by mouth daily in the morning and at 12pm. Take two tablets by mouth daily at bedtime. What changed:  how much to take how to take this when to take this additional instructions   insulin glargine 100 UNIT/ML injection Commonly known as: LANTUS Inject 20 Units into the skin daily.   IRON COMPLEX PO Take 65 mg by mouth daily.   lisinopril 20 MG tablet Commonly known as: ZESTRIL TAKE ONE TABLET BY MOUTH every morning What changed: when to take this   nitroGLYCERIN 0.4 MG SL tablet Commonly known as: NITROSTAT Place 1 tablet (0.4 mg total) under the tongue every 5 (five) minutes as needed for chest pain. What changed: Another medication with the same name was removed. Continue taking this medication, and follow the directions you see here.   omeprazole 40 MG capsule Commonly known as: PRILOSEC Take 40 mg by mouth daily.   Systane Balance 0.6 % Soln Generic drug: Propylene Glycol Place 1 drop into both eyes at bedtime.   Vitamin B Complex Tabs Take 1 tablet by mouth daily.   vitamin C 500 MG tablet Commonly known as: ASCORBIC ACID Take 500 mg by mouth daily.   Vitamin D3 50 MCG (2000 UT) Tabs Take 4,000 Units by mouth daily.        Follow-up Information     [facility] Follow up in 16 day(s).   Specialty: Cardiology Why: Hospital follow up PLEASE bring a current medication list to appointment FREE valet parking, Entrance C, off [employer] information: [address] [id number] [place]        Richardo Priest, MD Follow up.   Specialties: Cardiology, Radiology Why: Dr. Joya Gaskins office will call you to move up your appointment to a closer follow-up visit with our cardiology team. Contact information: [address] [place] [address] [phone]                No Known Allergies  Consultations: Cardiology   Procedures/Studies: CARDIAC CATHETERIZATION  Result Date: 02/14/2022 Conclusions: Severe single-vessel coronary artery disease involving RCA stent and flanking de-novo disease, as detailed in Dr. Tyrell Antonio diagnostic catheterization note from 02/12/2022. Normal left ventricular filling pressure (LVEDP 10 mmHg). Successful IVUS-guided PCI to RCA with placement of Synergy 3.5 x 12 mm (proximal) and 3.5 x 24 (distal) drug-eluting stents that overlap the previously placed stent.  The entire stented segment was postdilated to 4.1 mm with <10% residual stenosis and TIMI-3 flow. Recommendations: Remove right femoral artery sheath when ACT has fallen below 175 seconds with manual compression. Continue indefinite dual antiplatelet therapy  with aspirin and clopidogrel. Aggressive secondary prevention. Gentle post-catheterization hydration with close monitoring of renal function. Nelva Bush, MD [facility]  CARDIAC CATHETERIZATION  Result Date: 02/12/2022   Mid Cx lesion is 30% stenosed.   Prox RCA lesion is 90% stenosed.   Prox RCA to Mid RCA lesion is 50% stenosed.   Mid RCA lesion is 60% stenosed.   RPDA lesion is 50% stenosed. 1.  Severe one-vessel coronary artery disease involving the right coronary artery.  Patent right coronary artery stents with moderate diffuse in-stent restenosis.  However, there is a 90% stenosis at the proximal edge of the stent with hazy appearance highly suggestive of plaque rupture.  In addition, there is a 60% distal edge  stenosis. 2.  Moderate aortic stenosis with mean gradient of 23 mmHg. 3.  Moderately difficult procedure via the right radial artery due to significant tortuosity of the innominate and right subclavian arteries. Recommendations: 35 mL of contrast was used for today's diagnostic procedure.  Gentle hydration postprocedure. Recommend staged RCA PCI on Friday.  I suspect that the whole length of the lesion within the previously placed stents will need to be treated with another drug-eluting stent. Consider femoral artery access for better support given difficulty engaging the right coronary artery from the right radial artery.   ECHOCARDIOGRAM COMPLETE  Result Date: 02/10/2022    ECHOCARDIOGRAM REPORT   Patient Name:   Troy [name] Date of Exam: 02/10/2022 Medical Rec #:  [id number]      Height:       71.0 in Accession #:    [id number]     Weight:       168.4 lb Date of Birth:  05-Jan-1936      BSA:          1.960 m Patient Age:    53 years       BP:           138/46 mmHg Patient Gender: M              HR:           47 bpm. Exam Location:  Inpatient Procedure: 2D Echo, Cardiac Doppler, Color Doppler and 3D Echo                       STAT ECHO Reported to: Dr. Dia Crawford on 02/10/2022 3:00:00 PM. Indications:    NSTEMI I21.4  History:        Patient has prior history of Echocardiogram examinations, most                 recent 10/02/2020. CAD; Risk Factors:Dyslipidemia.  Sonographer:    Bernadene Person RDCS Referring Phys: Geraldo Docker WOODS IMPRESSIONS  1. Left ventricular ejection fraction, by estimation, is 55 to 60%. The left ventricle has normal function. The left ventricle demonstrates regional wall motion abnormalities (see scoring diagram/findings for description). Left ventricular diastolic parameters are indeterminate. There is mild hypokinesis of the left ventricular, entire inferior wall. There is mild hypokinesis of the left ventricular, basal inferolateral wall.  2. Right ventricular systolic function is  normal. The right ventricular size is normal. There is normal pulmonary artery systolic pressure.  3. Left atrial size was moderately dilated.  4. The mitral valve is normal in structure. Mild to moderate mitral valve regurgitation. No evidence of mitral stenosis.  5. The aortic valve is calcified. There is moderate calcification of the aortic valve. There is moderate thickening of the aortic valve.  Aortic valve regurgitation is not visualized. Moderate aortic valve stenosis.  6. The inferior vena cava is normal in size with greater than 50% respiratory variability, suggesting right atrial pressure of 3 mmHg. Comparison(s): Prior images reviewed side by side. Changes from prior study are noted. Overall similar LVEF, but current study with mild hypokinesis of the inferior/inferolateral wall as noted. Conclusion(s)/Recommendation(s): New mild wall motion abnormalities with preserved EF. Moderate aortic stenosis. Sinus bradycardia (as low as 37 bpm) noted throughout study. FINDINGS  Left Ventricle: Left ventricular ejection fraction, by estimation, is 55 to 60%. The left ventricle has normal function. The left ventricle demonstrates regional wall motion abnormalities. Mild hypokinesis of the left ventricular, entire inferior wall. Mild hypokinesis of the left ventricular, basal inferolateral wall. The left ventricular internal cavity size was normal in size. There is no left ventricular hypertrophy. Left ventricular diastolic parameters are indeterminate. Right Ventricle: The right ventricular size is normal. Right vetricular wall thickness was not well visualized. Right ventricular systolic function is normal. There is normal pulmonary artery systolic pressure. The tricuspid regurgitant velocity is 2.75 m/s, and with an assumed right atrial pressure of 3 mmHg, the estimated right ventricular systolic pressure is 78.2 mmHg. Left Atrium: Left atrial size was moderately dilated. Right Atrium: Right atrial size was normal  in size. Pericardium: There is no evidence of pericardial effusion. Mitral Valve: The mitral valve is normal in structure. There is moderate thickening of the mitral valve leaflet(s). There is moderate calcification of the mitral valve leaflet(s). Mild to moderate mitral valve regurgitation. No evidence of mitral valve stenosis. Tricuspid Valve: The tricuspid valve is normal in structure. Tricuspid valve regurgitation is mild . No evidence of tricuspid stenosis. Aortic Valve: The aortic valve is calcified. There is moderate calcification of the aortic valve. There is moderate thickening of the aortic valve. Aortic valve regurgitation is not visualized. Moderate aortic stenosis is present. Aortic valve mean gradient measures 23.3 mmHg. Aortic valve peak gradient measures 40.6 mmHg. Aortic valve area, by VTI measures 0.99 cm. Pulmonic Valve: The pulmonic valve was not well visualized. Pulmonic valve regurgitation is not visualized. No evidence of pulmonic stenosis. Aorta: The aortic root and ascending aorta are structurally normal, with no evidence of dilitation. Venous: The inferior vena cava is normal in size with greater than 50% respiratory variability, suggesting right atrial pressure of 3 mmHg. IAS/Shunts: The interatrial septum was not well visualized.  LEFT VENTRICLE PLAX 2D LVIDd:         4.80 cm      Diastology LVIDs:         3.40 cm      LV e' medial:    9.81 cm/s LV PW:         1.10 cm      LV E/e' medial:  13.0 LV IVS:        1.10 cm      LV e' lateral:   10.20 cm/s LVOT diam:     2.10 cm      LV E/e' lateral: 12.5 LV SV:         87 LV SV Index:   44 LVOT Area:     3.46 cm                              3D Volume EF: LV Volumes (MOD)            3D EF:  50 % LV vol d, MOD A2C: 120.0 ml LV EDV:       178 ml LV vol d, MOD A4C: 121.0 ml LV ESV:       89 ml LV vol s, MOD A2C: 46.0 ml  LV SV:        89 ml LV vol s, MOD A4C: 51.0 ml LV SV MOD A2C:     74.0 ml LV SV MOD A4C:     121.0 ml LV SV MOD BP:       75.4 ml RIGHT VENTRICLE RV S prime:     13.10 cm/s TAPSE (M-mode): 2.3 cm LEFT ATRIUM             Index        RIGHT ATRIUM           Index LA diam:        4.80 cm 2.45 cm/m   RA Area:     14.40 cm LA Vol (A2C):   69.4 ml 35.41 ml/m  RA Volume:   32.90 ml  16.78 ml/m LA Vol (A4C):   74.9 ml 38.21 ml/m LA Biplane Vol: 75.4 ml 38.47 ml/m  AORTIC VALVE AV Area (Vmax):    1.10 cm AV Area (Vmean):   0.88 cm AV Area (VTI):     0.99 cm AV Vmax:           318.67 cm/s AV Vmean:          228.333 cm/s AV VTI:            0.878 m AV Peak Grad:      40.6 mmHg AV Mean Grad:      23.3 mmHg LVOT Vmax:         101.00 cm/s LVOT Vmean:        58.100 cm/s LVOT VTI:          0.250 m LVOT/AV VTI ratio: 0.28  AORTA Ao Root diam: 3.30 cm Ao Asc diam:  3.50 cm MITRAL VALVE                  TRICUSPID VALVE MV Area (PHT): 5.13 cm       TR Peak grad:   30.2 mmHg MV Decel Time: 148 msec       TR Vmax:        275.00 cm/s MR Peak grad:    141.1 mmHg MR Mean grad:    97.0 mmHg    SHUNTS MR Vmax:         594.00 cm/s  Systemic VTI:  0.25 m MR Vmean:        471.0 cm/s   Systemic Diam: 2.10 cm MR PISA:         1.01 cm MR PISA Eff ROA: 7 mm MR PISA Radius:  0.40 cm MV E velocity: 128.00 cm/s MV A velocity: 79.10 cm/s MV E/A ratio:  1.62 Buford Dresser MD Electronically signed by Buford Dresser MD Signature Date/Time: 02/10/2022/3:01:01 PM    Final    DG Chest 1 View  Result Date: 02/10/2022 CLINICAL DATA:  86 year old male with chest pain. Feel similar to prior MI. EXAM: CHEST  1 VIEW COMPARISON:  Chest radiographs 10/01/2020 and earlier. FINDINGS: PA view at 0946 hours. Stable lung volumes, hyperinflated. Stable mild eventration of the diaphragm, normal variant. Mediastinal contours are stable and within normal limits. Visualized tracheal air column is within normal limits. Mild EKG button artifact in both upper lungs. No pneumothorax, pulmonary edema, pleural effusion  or confluent pulmonary opacity. No acute osseous  abnormality identified. Negative visible bowel gas. IMPRESSION: Chronic pulmonary hyperinflation. No acute cardiopulmonary abnormality. Electronically Signed   By: Genevie [name] M.D.   On: 02/10/2022 09:57   (Echo, Carotid, EGD, Colonoscopy, ERCP)    Subjective: Patient seen and examined.  Eager to go home.  Denies any chest pain.   Discharge Exam: Vitals:   02/15/22 0750 02/15/22 1125  BP: (!) 165/70 (!) 183/67  Pulse:  86  Resp:  16  Temp:  99.1 F (37.3 C)  SpO2:  98%   Vitals:   02/15/22 0317 02/15/22 0503 02/15/22 0750 02/15/22 1125  BP: (!) 161/81 (!) 160/86 (!) 165/70 (!) 183/67  Pulse:    86  Resp:  16  16  Temp:  98.5 F (36.9 C)  99.1 F (37.3 C)  TempSrc:  Oral  Oral  SpO2:  96%  98%  Weight:  70.4 kg    Height:        General: Pt is alert, awake, not in acute distress Cardiovascular: RRR, S1/S2 +, no rubs, no gallops Respiratory: CTA bilaterally, no wheezing, no rhonchi Abdominal: Soft, NT, ND, bowel sounds + Extremities: no edema, no cyanosis    The results of significant diagnostics from this hospitalization (including imaging, microbiology, ancillary and laboratory) are listed below for reference.     Microbiology: No results found for this or any previous visit (from the past 240 hour(s)).   Labs: BNP (last 3 results) Recent Labs    02/10/22 1607  BNP 308.6*   Basic Metabolic Panel: Recent Labs  Lab 02/11/22 0349 02/12/22 0304 02/13/22 0656 02/14/22 0300 02/15/22 0729  NA 133* 136 138 136 135  K 4.8 4.4 4.4 4.2 3.9  CL 103 105 107 108 107  CO2 21* 20* 21* 20* 18*  GLUCOSE 85 72 98 124* 155*  BUN 42* 49* 41* 46* 39*  CREATININE 2.62* 2.57* 2.28* 2.41* 2.08*  CALCIUM 8.9 9.4 9.5 9.6 9.2  MG 2.0 1.9 1.8 1.7 1.8  PHOS 4.6 4.8* 3.8 3.7 3.5   Liver Function Tests: Recent Labs  Lab 02/11/22 0349 02/12/22 0304 02/13/22 0656 02/14/22 0300 02/15/22 0729  AST 33 27 83* 39 40  ALT 21 22 84* 56* 45*  ALKPHOS 76 80 204* 168* 148*  BILITOT  0.6 0.6 1.0 1.1 1.0  PROT 5.7* 6.2* 6.9 6.6 7.0  ALBUMIN 3.0* 3.1* 3.4* 3.2* 3.4*   No results for input(s): "LIPASE", "AMYLASE" in the last 168 hours. No results for input(s): "AMMONIA" in the last 168 hours. CBC: Recent Labs  Lab 02/11/22 0349 02/11/22 2225 02/12/22 0304 02/13/22 0656 02/14/22 0300 02/15/22 0729  WBC 7.1  --  7.8 9.9 12.1* 13.9*  HGB 8.5* 9.8* 10.3* 11.3* 11.4* 12.0*  HCT 24.7* 27.9* 30.2* 32.8* 33.6* 35.5*  MCV 84.9  --  85.6 84.1 85.9 87.2  PLT 240  --  249 262 254 264   Cardiac Enzymes: No results for input(s): "CKTOTAL", "CKMB", "CKMBINDEX", "TROPONINI" in the last 168 hours. BNP: Invalid input(s): "POCBNP" CBG: Recent Labs  Lab 02/14/22 1727 02/14/22 2012 02/15/22 0713 02/15/22 0813 02/15/22 1233  GLUCAP 120* 301* 141* 218* 158*   D-Dimer No results for input(s): "DDIMER" in the last 72 hours. Hgb A1c No results for input(s): "HGBA1C" in the last 72 hours. Lipid Profile No results for input(s): "CHOL", "HDL", "LDLCALC", "TRIG", "CHOLHDL", "LDLDIRECT" in the last 72 hours. Thyroid function studies No results for input(s): "TSH", "T4TOTAL", "T3FREE", "THYROIDAB" in  the last 72 hours.  Invalid input(s): "FREET3" Anemia work up No results for input(s): "VITAMINB12", "FOLATE", "FERRITIN", "TIBC", "IRON", "RETICCTPCT" in the last 72 hours. Urinalysis No results found for: "COLORURINE", "APPEARANCEUR", "LABSPEC", "PHURINE", "GLUCOSEU", "HGBUR", "BILIRUBINUR", "KETONESUR", "PROTEINUR", "UROBILINOGEN", "NITRITE", "LEUKOCYTESUR" Sepsis Labs Recent Labs  Lab 02/12/22 0304 02/13/22 0656 02/14/22 0300 02/15/22 0729  WBC 7.8 9.9 12.1* 13.9*   Microbiology No results found for this or any previous visit (from the past 240 hour(s)).   Time coordinating discharge: 28 minutes  SIGNED:   Barb Merino, MD  [facility] 02/15/2022, 12:50 PM

## 2022-02-15 NOTE — Progress Notes (Signed)
CARDIAC REHAB PHASE I   PRE:  Rate/Rhythm: 85 SR  BP:  Supine:   Sitting: 165/70  Standing:    SaO2: 97% RA  MODE:  Ambulation: 440 ft   POST:  Rate/Rhythm: 96 SR  BP:  Supine:   Sitting: 171/75  Standing:    SaO2: 97% RA Patient tolerated ambulation well without difficulty or angina.  He uses a cane at home, has become slightly deconditioned and felt more comfortable using a rolling walker today.  He also has a walker at home.   Education completed re: MI, angina symptoms, NTG usage, when to call 911, when to call the doctor, home activity and exercise progression, dual antiplatelet therapy.  Given handouts re: heart healthy nutrition tips.  Referred to phase II cardiac rehab in [place]. [id number] Liliane Channel RN, BSN 02/15/2022 8:41 AM

## 2022-02-15 NOTE — Discharge Instructions (Addendum)
Post-Cath Instructions  No driving until cleared by your provider in follow-up. No lifting over 10 lbs for 2 weeks. No sexual activity for 2 weeks. Keep procedure site clean & dry. If you notice increased pain, swelling, bleeding or pus, call/return!  You may shower, but no soaking baths/hot tubs/pools for 1 week.

## 2022-02-15 NOTE — Progress Notes (Addendum)
Dr. Quentin Ore is signing off, requested chart review to ensure follow-up/post-cath instructions completed. I see HF TOC had already scheduled OV @ [facility] 8/7 but this is outside 7-14 day TOC window so I have sent a staff message to our office's scheduling team requesting a follow-up appointment with TOC call, and our office will call the patient with this information. Also updated DC instructions section with post-cath instructions regarding driving, lifting, wound care. Given bradycardia hx would advise no driving until seen in follow-up.

## 2022-02-15 NOTE — Progress Notes (Signed)
Progress Note  Patient Name: Troy [name] Date of Encounter: 02/15/2022  [facility] Cardiologist: Shirlee More, MD   Subjective   NAEO. PCI yesterday to area of ISR in RCA. No cp this AM.  Inpatient Medications    Scheduled Meds:  amLODipine  10 mg Oral Daily   aspirin EC  81 mg Oral Daily   atorvastatin  80 mg Oral QHS   clopidogrel  75 mg Oral Daily   heparin  5,000 Units Subcutaneous Q8H   hydrALAZINE  25 mg Oral Q8H   insulin aspart  0-9 Units Subcutaneous Q4H   insulin glargine-yfgn  10 Units Subcutaneous Daily   lipase/protease/amylase  24,000 Units Oral TID AC   sodium chloride flush  3 mL Intravenous Q12H   sodium chloride flush  3 mL Intravenous Q12H   sodium chloride flush  3 mL Intravenous Q12H   sodium chloride flush  3 mL Intravenous Q12H   Continuous Infusions:  sodium chloride     sodium chloride     PRN Meds: sodium chloride, sodium chloride, acetaminophen, labetalol, nitroGLYCERIN, ondansetron (ZOFRAN) IV, sodium chloride flush, sodium chloride flush   Vital Signs    Vitals:   02/15/22 0116 02/15/22 0317 02/15/22 0503 02/15/22 0750  BP: (!) 157/66 (!) 161/81 (!) 160/86 (!) 165/70  Pulse:      Resp:   16   Temp:   98.5 F (36.9 C)   TempSrc:   Oral   SpO2:   96%   Weight:   70.4 kg   Height:        Intake/Output Summary (Last 24 hours) at 02/15/2022 1109 Last data filed at 02/15/2022 0021 Gross per 24 hour  Intake 941.37 ml  Output 275 ml  Net 666.37 ml      02/15/2022    5:03 AM 02/14/2022    4:39 AM 02/13/2022    5:32 AM  Last 3 Weights  Weight (lbs) 155 lb 3.3 oz 158 lb 12.8 oz 159 lb 9.6 oz  Weight (kg) 70.4 kg 72.031 kg 72.394 kg      Telemetry    Personally Reviewed  ECG    Personally Reviewed  Physical Exam   GEN: No acute distress.   Neck: No JVD Cardiac: RRR, no murmurs, rubs, or gallops. RFA access site without hematoma or pain. Respiratory: Clear to auscultation bilaterally. GI: Soft, nontender,  non-distended  MS: No edema; No deformity. Neuro:  Nonfocal  Psych: Normal affect   Labs    High Sensitivity Troponin:   Recent Labs  Lab 02/10/22 0943 02/10/22 1132 02/10/22 1607 02/10/22 1745 02/10/22 2039  TROPONINIHS 390* 549* 2,109* 2,800* 2,703*     Chemistry Recent Labs  Lab 02/13/22 0656 02/14/22 0300 02/15/22 0729  NA 138 136 135  K 4.4 4.2 3.9  CL 107 108 107  CO2 21* 20* 18*  GLUCOSE 98 124* 155*  BUN 41* 46* 39*  CREATININE 2.28* 2.41* 2.08*  CALCIUM 9.5 9.6 9.2  MG 1.8 1.7 1.8  PROT 6.9 6.6 7.0  ALBUMIN 3.4* 3.2* 3.4*  AST 83* 39 40  ALT 84* 56* 45*  ALKPHOS 204* 168* 148*  BILITOT 1.0 1.1 1.0  GFRNONAA 28* 26* 31*  ANIONGAP '10 8 10    '$ Lipids  Recent Labs  Lab 02/10/22 1607  CHOL 102  TRIG 21  HDL 39*  LDLCALC 59  CHOLHDL 2.6    Hematology Recent Labs  Lab 02/13/22 0656 02/14/22 0300 02/15/22 0729  WBC 9.9 12.1* 13.9*  RBC 3.90* 3.91* 4.07*  HGB 11.3* 11.4* 12.0*  HCT 32.8* 33.6* 35.5*  MCV 84.1 85.9 87.2  MCH 29.0 29.2 29.5  MCHC 34.5 33.9 33.8  RDW 14.6 14.7 14.7  PLT 262 254 264   Thyroid No results for input(s): "TSH", "FREET4" in the last 168 hours.  BNP Recent Labs  Lab 02/10/22 1607  BNP 845.7*    DDimer No results for input(s): "DDIMER" in the last 168 hours.   Radiology    CARDIAC CATHETERIZATION  Result Date: 02/14/2022 Conclusions: Severe single-vessel coronary artery disease involving RCA stent and flanking de-novo disease, as detailed in Dr. Tyrell Antonio diagnostic catheterization note from 02/12/2022. Normal left ventricular filling pressure (LVEDP 10 mmHg). Successful IVUS-guided PCI to RCA with placement of Synergy 3.5 x 12 mm (proximal) and 3.5 x 24 (distal) drug-eluting stents that overlap the previously placed stent.  The entire stented segment was postdilated to 4.1 mm with <10% residual stenosis and TIMI-3 flow. Recommendations: Remove right femoral artery sheath when ACT has fallen below 175 seconds with  manual compression. Continue indefinite dual antiplatelet therapy with aspirin and clopidogrel. Aggressive secondary prevention. Gentle post-catheterization hydration with close monitoring of renal function. Nelva Bush, MD [facility]   Assessment & Plan    [phone] man with hx of HF, HTN, CKD4, CAD s/p prior PCI in 2001, AS, mobitz I, 2:1 av block and PAD who cardiology consulted 02/10/2022 for ACS. Troponin elevated to 2800. Diagnostic LHC 02/12/2022 with severe ISR to RCA stent. Staged PCI performed 02/15/2022.   #CAD #NSTEMI - s/p successful IVUS guided PCI to RCA with 2 overlapping stents. Performed via RFA. - no bb given hx of AV block - cont asa, plavix, statin  #HFpEF Nyha II. Dry on exam.  #HTN  #CKD4 Cr stable  OK to discharge from a cardiology perspective with close outpatient follow up.   For questions or updates, please contact [place] Please consult [email or web address] for contact info under        Signed, Vickie Epley, MD  02/15/2022, 11:09 AM

## 2022-02-17 ENCOUNTER — Encounter (HOSPITAL_COMMUNITY): Payer: Self-pay | Admitting: Internal Medicine

## 2022-02-18 NOTE — Progress Notes (Unsigned)
Cardiology Office Note:    Date:  02/19/2022   ID:  Troy [name], DOB 11/06/1935, MRN [id number]  PCP:  Street, [name], MD  Cardiologist:  Shirlee More, MD    Referring MD: [address], [name], *    ASSESSMENT:    1. Coronary artery disease involving native coronary artery of native heart with angina pectoris ([place])   2. Hypertensive heart disease with heart failure ([place])   3. Nonrheumatic aortic valve stenosis   4. CKD (chronic kidney disease) stage 4, GFR 15-29 ml/min (HCC)   5. Sinus bradycardia on ECG   6. Mobitz (type) I (Wenckebach's) atrioventricular block    PLAN:    In order of problems listed above:  Troy [name] is doing better following his staged PCI having no angina on current medical therapy we will continue his uninterrupted dual antiplatelet and multiple antihypertensives and lipid-lowering with his high intensity statin Stable no fluid overload continue current treatment which has been effective in managing his hypertension and heart failure His aortic stenosis progressed on the most recent echocardiogram we will need to recheck in 6 months to a year We will recheck his CBC with anemia and his BMP with stage IV CKD in 2 weeks, I would not restart his SGLT2 inhibitor Stable bradycardia no problems in hospital requiring reconsideration of pacemaker therapy we will continue to avoid rate slowing medications   Next appointment: 3 months   Medication Adjustments/Labs and Tests Ordered: Current medicines are reviewed at length with the patient today.  Concerns regarding medicines are outlined above.  No orders of the defined types were placed in this encounter.  No orders of the defined types were placed in this encounter.   Chief Complaint  Patient presents with   Follow-up   Coronary Artery Disease    History of Present Illness:    Troy [name] is a 86 y.o. male with a hx of hypertensive heart disease with heart failure and stage IV CKD CAD  with previous PCI mild aortic stenosis and previous symptomatic bradycardia resolved stopping a beta-blocker last seen 02/07/2022 referred for admission and coronary angiography  He had recent acute coronary syndrome admission to the hospital and staged PCI and stent because of severe renal insufficiency he had severe in-stent restenosis right coronary stent with successful PCI with 2 overlapping stents 58/52/7782 without complication.  Compliance with diet, lifestyle and medications: Yes  His lipoprotein a level was elevated at 226 on a statin He was anemic and required transfusion last hemoglobin 12.0 Severe CKD during the hospitalization his creatinine was relatively stable running between 2.23-2.62 and 2.41 at discharge without hyperkalemia  Left heart cath 02/12/2022: Conclusion      Mid Cx lesion is 30% stenosed.   Prox RCA lesion is 90% stenosed.   Prox RCA to Mid RCA lesion is 50% stenosed.   Mid RCA lesion is 60% stenosed.   RPDA lesion is 50% stenosed.   1.  Severe one-vessel coronary artery disease involving the right coronary artery.  Patent right coronary artery stents with moderate diffuse in-stent restenosis.  However, there is a 90% stenosis at the proximal edge of the stent with hazy appearance highly suggestive of plaque rupture.  In addition, there is a 60% distal edge stenosis. 2.  Moderate aortic stenosis with mean gradient of 23 mmHg. 3.  Moderately difficult procedure via the right radial artery due to significant tortuosity of the innominate and right subclavian arteries.   Recommendations: 35 mL of contrast was  used for today's diagnostic procedure.  Gentle hydration postprocedure. Recommend staged RCA PCI on Friday.  I suspect that the whole length of the lesion within the previously placed stents will need to be treated with another drug-eluting stent.  His echocardiogram performed 02/10/2022 showed a preserved ejection fraction 55 to 60% with inferior and  inferolateral hypokinesia right ventricle is normal size and function left atrium is moderately dilated there is mild to moderate mitral regurgitation moderate aortic stenosis with peak and mean gradients of 41 and 23 mmHg.  1. Left ventricular ejection fraction, by estimation, is 55 to 60%. The  left ventricle has normal function. The left ventricle demonstrates  regional wall motion abnormalities (see scoring diagram/findings for  description). Left ventricular diastolic  parameters are indeterminate. There is mild hypokinesis of the left  ventricular, entire inferior wall. There is mild hypokinesis of the left  ventricular, basal inferolateral wall.   2. Right ventricular systolic function is normal. The right ventricular  size is normal. There is normal pulmonary artery systolic pressure.   3. Left atrial size was moderately dilated.   4. The mitral valve is normal in structure. Mild to moderate mitral valve  regurgitation. No evidence of mitral stenosis.   5. The aortic valve is calcified. There is moderate calcification of the  aortic valve. There is moderate thickening of the aortic valve. Aortic  valve regurgitation is not visualized. Moderate aortic valve stenosis.   6. The inferior vena cava is normal in size with greater than 50%  respiratory variability, suggesting right atrial pressure of 3 mmHg.   Overall he is feeling much better his wife is a retired Marine scientist his heart rate blood pressure has been good no bradycardia or hypertension he has had no edema shortness of breath chest pain palpitation or syncope We both worried about his anemia in 2 weeks we will check a CBC and recheck his renal function Despite stage IV CKD he got through his coronary angiogram and staged PCI without acute kidney injury Past Medical History:  Diagnosis Date   Anemia 12/09/2015   CAD in native artery 12/09/2015   Overview:  PCI/ stent 2001   Chronic diastolic heart failure ([place]) 12/09/2015   Coronary  artery disease involving native coronary artery of native heart with angina pectoris ([place]) 12/09/2015   Overview:  PCI/ stent 2001   Hyperlipidemia 12/09/2015   Hypertensive heart disease with heart failure ([place]) 12/09/2015   Necrotizing pancreatitis 06/26/2015   Pseudocyst of pancreas 06/26/2015    Past Surgical History:  Procedure Laterality Date   ABDOMINAL AORTOGRAM W/LOWER EXTREMITY Bilateral 09/10/2018   Procedure: ABDOMINAL AORTOGRAM W/LOWER EXTREMITY;  Surgeon: Elam Dutch, MD;  Location: [facility];  Service: Cardiovascular;  Laterality: Bilateral;   CHOLECYSTECTOMY     CORONARY STENT INTERVENTION N/A 02/14/2022   Procedure: CORONARY STENT INTERVENTION;  Surgeon: Nelva Bush, MD;  Location: [facility];  Service: Cardiovascular;  Laterality: N/A;   INTRAVASCULAR ULTRASOUND/IVUS N/A 02/14/2022   Procedure: Intravascular Ultrasound/IVUS;  Surgeon: Nelva Bush, MD;  Location: [facility];  Service: Cardiovascular;  Laterality: N/A;   LEFT HEART CATH N/A 02/14/2022   Procedure: Left Heart Cath;  Surgeon: Nelva Bush, MD;  Location: [facility];  Service: Cardiovascular;  Laterality: N/A;   LEFT HEART CATH AND CORONARY ANGIOGRAPHY N/A 02/12/2022   Procedure: LEFT HEART CATH AND CORONARY ANGIOGRAPHY;  Surgeon: Wellington Hampshire, MD;  Location: [facility];  Service: Cardiovascular;  Laterality: N/A;  PERIPHERAL VASCULAR INTERVENTION Right 09/10/2018   Procedure: PERIPHERAL VASCULAR INTERVENTION;  Surgeon: Elam Dutch, MD;  Location: [facility];  Service: Cardiovascular;  Laterality: Right;  common iliac   TONSILLECTOMY AND ADENOIDECTOMY     TOTAL KNEE REVISION Bilateral     Current Medications: Current Meds  Medication Sig   acetaminophen (TYLENOL) 500 MG tablet Take 500-1,000 mg by mouth every 6 (six) hours as needed for moderate pain.   amLODipine (NORVASC) 5 MG tablet Take 1 tablet (5 mg total) by mouth in the morning  and at bedtime.   aspirin EC 81 MG tablet Take 1 tablet (81 mg total) by mouth daily. Swallow whole.   atorvastatin (LIPITOR) 80 MG tablet TAKE 1 TABLET EACH EVENING   B Complex Vitamins (VITAMIN B COMPLEX) TABS Take 1 tablet by mouth daily.   Cholecalciferol (VITAMIN D3) 50 MCG (2000 UT) TABS Take 4,000 Units by mouth daily.   Cinnamon 500 MG capsule Take 500 mg by mouth daily.   cloNIDine (CATAPRES) 0.1 MG tablet Take 1 tablet (0.1 mg total) by mouth 2 (two) times daily.   clopidogrel (PLAVIX) 75 MG tablet Take 75 mg by mouth daily.   co-enzyme Q-10 30 MG capsule Take 30 mg by mouth daily.   doxazosin (CARDURA) 2 MG tablet Take 2 mg by mouth at bedtime.    hydrALAZINE (APRESOLINE) 25 MG tablet Take one tablet by mouth daily in the morning and at 12pm. Take two tablets by mouth daily at bedtime.   insulin glargine (LANTUS) 100 UNIT/ML injection Inject 20 Units into the skin daily.   Iron Combinations (IRON COMPLEX PO) Take 65 mg by mouth daily.   lisinopril (ZESTRIL) 20 MG tablet TAKE ONE TABLET BY MOUTH every morning   Multiple Vitamins-Minerals (EYE VITAMINS PO) Take 1 tablet by mouth daily.   nitroGLYCERIN (NITROSTAT) 0.4 MG SL tablet Place 1 tablet (0.4 mg total) under the tongue every 5 (five) minutes as needed for chest pain.   omeprazole (PRILOSEC) 40 MG capsule Take 40 mg by mouth daily.   Pancrelipase, Lip-Prot-Amyl, (CREON) 24000-76000 units CPEP Take 1 capsule by mouth in the morning, at noon, and at bedtime.   Propylene Glycol (SYSTANE BALANCE) 0.6 % SOLN Place 1 drop into both eyes at bedtime.   vitamin C (ASCORBIC ACID) 500 MG tablet Take 500 mg by mouth daily.     Allergies:   Patient has no known allergies.   Social History   Socioeconomic History   Marital status: Married    Spouse name: [name]   Number of children: 3   Years of education: Not on file   Highest education level: Associate degree: academic program  Occupational History   Occupation: Retired  Tobacco Use    Smoking status: Former    Types: Cigarettes    Passive exposure: Past   Smokeless tobacco: Never  Scientific laboratory technician Use: Never used  Substance and Sexual Activity   Alcohol use: No   Drug use: No   Sexual activity: Not on file  Other Topics Concern   Not on file  Social History Narrative   Not on file   Social Determinants of Health   Financial Resource Strain: Low Risk  (02/14/2022)   Overall Financial Resource Strain (CARDIA)    Difficulty of Paying Living Expenses: Not hard at all  Food Insecurity: No Food Insecurity (02/14/2022)   Hunger Vital Sign    Worried About Running Out of Food in the Last Year:  Never true    Ran Out of Food in the Last Year: Never true  Transportation Needs: No Transportation Needs (02/14/2022)   PRAPARE - Hydrologist (Medical): No    Lack of Transportation (Non-Medical): No  Physical Activity: Not on file  Stress: Not on file  Social Connections: Not on file     Family History: The patient's family history includes Diabetes in his mother; Heart failure in his father and mother. ROS:   Please see the history of present illness.    All other systems reviewed and are negative.  EKGs/Labs/Other Studies Reviewed:    The following studies were reviewed today:    Recent Labs: 02/10/2022: B Natriuretic Peptide 845.7 02/15/2022: ALT 45; BUN 39; Creatinine, Ser 2.08; Hemoglobin 12.0; Magnesium 1.8; Platelets 264; Potassium 3.9; Sodium 135  Recent Lipid Panel    Component Value Date/Time   CHOL 102 02/10/2022 1607   TRIG 21 02/10/2022 1607   HDL 39 (L) 02/10/2022 1607   CHOLHDL 2.6 02/10/2022 1607   VLDL 4 02/10/2022 1607   LDLCALC 59 02/10/2022 1607    Physical Exam:    VS:  BP (!) 112/50 (BP Location: Left Arm, Patient Position: Sitting, Cuff Size: Normal)   Pulse 64   Ht '5\' 9"'$  (1.753 m)   Wt 164 lb (74.4 kg)   SpO2 99%   BMI 24.22 kg/m     Wt Readings from Last 3 Encounters:  02/19/22 164 lb (74.4  kg)  02/15/22 155 lb 3.3 oz (70.4 kg)  02/07/22 168 lb 6.4 oz (76.4 kg)     GEN: He appears frail elderly in no acute distress HEENT: Normal NECK: No JVD; No carotid bruits LYMPHATICS: No lymphadenopathy CARDIAC: RRR, no murmurs, rubs, gallops RESPIRATORY:  Clear to auscultation without rales, wheezing or rhonchi  ABDOMEN: Soft, non-tender, non-distended MUSCULOSKELETAL:  No edema; No deformity  SKIN: Warm and dry NEUROLOGIC:  Alert and oriented x 3 PSYCHIATRIC:  Normal affect    Signed, Shirlee More, MD  02/19/2022 9:42 AM    [place]

## 2022-02-19 ENCOUNTER — Ambulatory Visit (INDEPENDENT_AMBULATORY_CARE_PROVIDER_SITE_OTHER): Payer: Medicare Other | Admitting: Cardiology

## 2022-02-19 ENCOUNTER — Encounter: Payer: Self-pay | Admitting: Cardiology

## 2022-02-19 VITALS — BP 112/50 | HR 64 | Ht 69.0 in | Wt 164.0 lb

## 2022-02-19 DIAGNOSIS — I350 Nonrheumatic aortic (valve) stenosis: Secondary | ICD-10-CM | POA: Diagnosis not present

## 2022-02-19 DIAGNOSIS — N184 Chronic kidney disease, stage 4 (severe): Secondary | ICD-10-CM

## 2022-02-19 DIAGNOSIS — R001 Bradycardia, unspecified: Secondary | ICD-10-CM | POA: Diagnosis not present

## 2022-02-19 DIAGNOSIS — I25119 Atherosclerotic heart disease of native coronary artery with unspecified angina pectoris: Secondary | ICD-10-CM

## 2022-02-19 DIAGNOSIS — I110 Hypertensive heart disease with heart failure: Secondary | ICD-10-CM | POA: Diagnosis not present

## 2022-02-19 DIAGNOSIS — I441 Atrioventricular block, second degree: Secondary | ICD-10-CM

## 2022-02-19 NOTE — Patient Instructions (Addendum)
Medication Instructions:  Your physician recommends that you continue on your current medications as directed. Please refer to the Current Medication list given to you today.  *If you need a refill on your cardiac medications before your next appointment, please call your pharmacy*   Lab Work: Your physician recommends that you return for lab work in:   Labs today in 2 weeks: CBC, BMP  If you have labs (blood work) drawn today and your tests are completely normal, you will receive your results only by: [place] (if you have [place]) OR A paper copy in the mail If you have any lab test that is abnormal or we need to change your treatment, we will call you to review the results.   Testing/Procedures: None   Follow-Up: At [facility], you and your health needs are our priority.  As part of our continuing mission to provide you with exceptional heart care, we have created designated Provider Care Teams.  These Care Teams include your primary Cardiologist (physician) and Advanced Practice Providers (APPs -  Physician Assistants and Nurse Practitioners) who all work together to provide you with the care you need, when you need it.  We recommend signing up for the patient portal called "MyChart".  Sign up information is provided on this After Visit Summary.  MyChart is used to connect with patients for Virtual Visits (Telemedicine).  Patients are able to view lab/test results, encounter notes, upcoming appointments, etc.  Non-urgent messages can be sent to your provider as well.   To learn more about what you can do with MyChart, go to [email or web address].    Your next appointment:   3 month(s)  The format for your next appointment:   In Person  Provider:   Shirlee More, MD    Other Instructions None  Important Information About Sugar

## 2022-02-24 ENCOUNTER — Telehealth (HOSPITAL_COMMUNITY): Payer: Self-pay

## 2022-02-24 NOTE — Telephone Encounter (Signed)
Per phase I cardiac rehab, fax cardiac rehab referral to [facility].

## 2022-02-27 ENCOUNTER — Ambulatory Visit (INDEPENDENT_AMBULATORY_CARE_PROVIDER_SITE_OTHER): Payer: Medicare Other | Admitting: Podiatry

## 2022-02-27 ENCOUNTER — Encounter: Payer: Self-pay | Admitting: Podiatry

## 2022-02-27 DIAGNOSIS — M79674 Pain in right toe(s): Secondary | ICD-10-CM | POA: Diagnosis not present

## 2022-02-27 DIAGNOSIS — B351 Tinea unguium: Secondary | ICD-10-CM | POA: Diagnosis not present

## 2022-02-27 DIAGNOSIS — M79675 Pain in left toe(s): Secondary | ICD-10-CM | POA: Diagnosis not present

## 2022-02-27 DIAGNOSIS — E1142 Type 2 diabetes mellitus with diabetic polyneuropathy: Secondary | ICD-10-CM

## 2022-03-03 ENCOUNTER — Encounter (HOSPITAL_COMMUNITY): Payer: Medicare Other

## 2022-03-03 DIAGNOSIS — N184 Chronic kidney disease, stage 4 (severe): Secondary | ICD-10-CM | POA: Diagnosis not present

## 2022-03-03 DIAGNOSIS — I129 Hypertensive chronic kidney disease with stage 1 through stage 4 chronic kidney disease, or unspecified chronic kidney disease: Secondary | ICD-10-CM | POA: Diagnosis not present

## 2022-03-03 DIAGNOSIS — I252 Old myocardial infarction: Secondary | ICD-10-CM | POA: Diagnosis not present

## 2022-03-03 DIAGNOSIS — E1122 Type 2 diabetes mellitus with diabetic chronic kidney disease: Secondary | ICD-10-CM | POA: Diagnosis not present

## 2022-03-03 DIAGNOSIS — I350 Nonrheumatic aortic (valve) stenosis: Secondary | ICD-10-CM | POA: Diagnosis not present

## 2022-03-03 DIAGNOSIS — Z794 Long term (current) use of insulin: Secondary | ICD-10-CM | POA: Diagnosis not present

## 2022-03-03 DIAGNOSIS — E1169 Type 2 diabetes mellitus with other specified complication: Secondary | ICD-10-CM | POA: Diagnosis not present

## 2022-03-05 DIAGNOSIS — E1122 Type 2 diabetes mellitus with diabetic chronic kidney disease: Secondary | ICD-10-CM | POA: Diagnosis not present

## 2022-03-05 DIAGNOSIS — R001 Bradycardia, unspecified: Secondary | ICD-10-CM | POA: Diagnosis not present

## 2022-03-05 DIAGNOSIS — N184 Chronic kidney disease, stage 4 (severe): Secondary | ICD-10-CM | POA: Diagnosis not present

## 2022-03-05 DIAGNOSIS — I441 Atrioventricular block, second degree: Secondary | ICD-10-CM | POA: Diagnosis not present

## 2022-03-05 DIAGNOSIS — I129 Hypertensive chronic kidney disease with stage 1 through stage 4 chronic kidney disease, or unspecified chronic kidney disease: Secondary | ICD-10-CM | POA: Diagnosis not present

## 2022-03-05 DIAGNOSIS — E1169 Type 2 diabetes mellitus with other specified complication: Secondary | ICD-10-CM | POA: Diagnosis not present

## 2022-03-05 DIAGNOSIS — I25119 Atherosclerotic heart disease of native coronary artery with unspecified angina pectoris: Secondary | ICD-10-CM | POA: Diagnosis not present

## 2022-03-05 DIAGNOSIS — Z794 Long term (current) use of insulin: Secondary | ICD-10-CM | POA: Diagnosis not present

## 2022-03-05 DIAGNOSIS — I350 Nonrheumatic aortic (valve) stenosis: Secondary | ICD-10-CM | POA: Diagnosis not present

## 2022-03-05 DIAGNOSIS — I110 Hypertensive heart disease with heart failure: Secondary | ICD-10-CM | POA: Diagnosis not present

## 2022-03-06 DIAGNOSIS — H353222 Exudative age-related macular degeneration, left eye, with inactive choroidal neovascularization: Secondary | ICD-10-CM | POA: Diagnosis not present

## 2022-03-06 DIAGNOSIS — H353211 Exudative age-related macular degeneration, right eye, with active choroidal neovascularization: Secondary | ICD-10-CM | POA: Diagnosis not present

## 2022-03-06 LAB — CBC
Hematocrit: 29.6 % — ABNORMAL LOW (ref 37.5–51.0)
Hemoglobin: 9.6 g/dL — ABNORMAL LOW (ref 13.0–17.7)
MCH: 28.6 pg (ref 26.6–33.0)
MCHC: 32.4 g/dL (ref 31.5–35.7)
MCV: 88 fL (ref 79–97)
Platelets: 330 10*3/uL (ref 150–450)
RBC: 3.36 x10E6/uL — ABNORMAL LOW (ref 4.14–5.80)
RDW: 13.4 % (ref 11.6–15.4)
WBC: 6.6 10*3/uL (ref 3.4–10.8)

## 2022-03-06 LAB — BASIC METABOLIC PANEL
BUN/Creatinine Ratio: 18 (ref 10–24)
BUN: 37 mg/dL — ABNORMAL HIGH (ref 8–27)
CO2: 20 mmol/L (ref 20–29)
Calcium: 9.8 mg/dL (ref 8.6–10.2)
Chloride: 101 mmol/L (ref 96–106)
Creatinine, Ser: 2.09 mg/dL — ABNORMAL HIGH (ref 0.76–1.27)
Glucose: 116 mg/dL — ABNORMAL HIGH (ref 70–99)
Potassium: 5.4 mmol/L — ABNORMAL HIGH (ref 3.5–5.2)
Sodium: 137 mmol/L (ref 134–144)
eGFR: 31 mL/min/{1.73_m2} — ABNORMAL LOW (ref >59)

## 2022-03-06 NOTE — Progress Notes (Signed)
  Subjective:  Patient ID: Troy [name], male    DOB: March 16, 1936,  MRN: [id number]  Troy [name] presents to clinic today for at risk foot care with history of diabetic neuropathy and painful elongated mycotic toenails 1-5 bilaterally which are tender when wearing enclosed shoe gear. Pain is relieved with periodic professional debridement.  Patient states blood glucose was 248 mg/dl today.  Last known HgA1c was unknown.    New problem(s): None.   PCP is Street, [name], MD , and last visit was  August 27, 2021.  No Known Allergies  Review of Systems: Negative except as noted in the HPI.  Objective: No changes noted in today's physical examination. Troy [name] is a pleasant 86 y.o. male in NAD. AAO X 3.  Vascular Examination: CFT <3 seconds b/l LE. Faintly palpable DP pulses b/l LE. Faintly palpable PT pulse(s) b/l LE. Pedal hair absent. No pain with calf compression b/l. Lower extremity skin temperature gradient within normal limits. No edema noted b/l LE. Varicosities present b/l. No cyanosis or clubbing noted b/l LE.  Dermatological Examination: Pedal skin thin, shiny and atrophic b/l LE. No open wounds b/l LE. No interdigital macerations noted b/l LE. Toenails 1-5 b/l elongated, discolored, dystrophic, thickened, crumbly with subungual debris and tenderness to dorsal palpation. No hyperkeratotic nor porokeratotic lesions present on today's visit.  Musculoskeletal Examination: Muscle strength 5/5 to all lower extremity muscle groups bilaterally. HAV with bunion deformity noted b/l LE.  Neurological Examination: Protective sensation decreased with 10 gram monofilament b/l. Vibratory sensation intact b/l.     Latest Ref Rng & Units 02/10/2022    4:07 PM  Hemoglobin A1C  Hemoglobin-A1c 4.8 - 5.6 % 6.8    Assessment/Plan: 1. Pain due to onychomycosis of toenails of both feet   2. Diabetic peripheral neuropathy associated with type 2 diabetes mellitus ([place])      -Examined patient. -No new findings. No new orders. -Patient to continue soft, supportive shoe gear daily. -Mycotic toenails 1-5 bilaterally were debrided in length and girth with sterile nail nippers and dremel without incident. -Patient/POA to call should there be question/concern in the interim.   Return in about 3 months (around 05/30/2022).  Marzetta Board, DPM

## 2022-03-07 DIAGNOSIS — E1122 Type 2 diabetes mellitus with diabetic chronic kidney disease: Secondary | ICD-10-CM | POA: Diagnosis not present

## 2022-03-07 DIAGNOSIS — E1169 Type 2 diabetes mellitus with other specified complication: Secondary | ICD-10-CM | POA: Diagnosis not present

## 2022-03-07 DIAGNOSIS — N184 Chronic kidney disease, stage 4 (severe): Secondary | ICD-10-CM | POA: Diagnosis not present

## 2022-03-07 DIAGNOSIS — I129 Hypertensive chronic kidney disease with stage 1 through stage 4 chronic kidney disease, or unspecified chronic kidney disease: Secondary | ICD-10-CM | POA: Diagnosis not present

## 2022-03-07 DIAGNOSIS — Z794 Long term (current) use of insulin: Secondary | ICD-10-CM | POA: Diagnosis not present

## 2022-03-07 DIAGNOSIS — I350 Nonrheumatic aortic (valve) stenosis: Secondary | ICD-10-CM | POA: Diagnosis not present

## 2022-03-10 DIAGNOSIS — E1169 Type 2 diabetes mellitus with other specified complication: Secondary | ICD-10-CM | POA: Diagnosis not present

## 2022-03-10 DIAGNOSIS — I129 Hypertensive chronic kidney disease with stage 1 through stage 4 chronic kidney disease, or unspecified chronic kidney disease: Secondary | ICD-10-CM | POA: Diagnosis not present

## 2022-03-10 DIAGNOSIS — I350 Nonrheumatic aortic (valve) stenosis: Secondary | ICD-10-CM | POA: Diagnosis not present

## 2022-03-10 DIAGNOSIS — E1122 Type 2 diabetes mellitus with diabetic chronic kidney disease: Secondary | ICD-10-CM | POA: Diagnosis not present

## 2022-03-10 DIAGNOSIS — Z794 Long term (current) use of insulin: Secondary | ICD-10-CM | POA: Diagnosis not present

## 2022-03-10 DIAGNOSIS — N184 Chronic kidney disease, stage 4 (severe): Secondary | ICD-10-CM | POA: Diagnosis not present

## 2022-03-12 DIAGNOSIS — N184 Chronic kidney disease, stage 4 (severe): Secondary | ICD-10-CM | POA: Diagnosis not present

## 2022-03-12 DIAGNOSIS — I129 Hypertensive chronic kidney disease with stage 1 through stage 4 chronic kidney disease, or unspecified chronic kidney disease: Secondary | ICD-10-CM | POA: Diagnosis not present

## 2022-03-12 DIAGNOSIS — I350 Nonrheumatic aortic (valve) stenosis: Secondary | ICD-10-CM | POA: Diagnosis not present

## 2022-03-12 DIAGNOSIS — E1122 Type 2 diabetes mellitus with diabetic chronic kidney disease: Secondary | ICD-10-CM | POA: Diagnosis not present

## 2022-03-12 DIAGNOSIS — Z794 Long term (current) use of insulin: Secondary | ICD-10-CM | POA: Diagnosis not present

## 2022-03-12 DIAGNOSIS — E1169 Type 2 diabetes mellitus with other specified complication: Secondary | ICD-10-CM | POA: Diagnosis not present

## 2022-03-13 ENCOUNTER — Telehealth: Payer: Self-pay | Admitting: Cardiology

## 2022-03-13 DIAGNOSIS — N184 Chronic kidney disease, stage 4 (severe): Secondary | ICD-10-CM

## 2022-03-13 NOTE — Addendum Note (Signed)
Addended by: Truddie Hidden on: 03/13/2022 05:11 PM   Modules accepted: Orders

## 2022-03-13 NOTE — Telephone Encounter (Signed)
Pt c/o medication issue:  1. Name of Medication: farxiga  2. How are you currently taking this medication (dosage and times per day)? Not currently taking  3. Are you having a reaction (difficulty breathing--STAT)? no  4. What is your medication issue? Patient's wife states the patient was taken off [place] and they thought that was temporary until he had lab work. She states they have not heard back yet and are not sure if he needs to stay off of it. Phone: [phone]

## 2022-03-13 NOTE — Telephone Encounter (Signed)
Results reviewed with Lelon Frohlich per DPR as per Dr. Joya Gaskins note.  [name] verbalized understanding and had no additional questions. Routed to PCP

## 2022-03-14 DIAGNOSIS — N184 Chronic kidney disease, stage 4 (severe): Secondary | ICD-10-CM | POA: Diagnosis not present

## 2022-03-14 DIAGNOSIS — I129 Hypertensive chronic kidney disease with stage 1 through stage 4 chronic kidney disease, or unspecified chronic kidney disease: Secondary | ICD-10-CM | POA: Diagnosis not present

## 2022-03-14 DIAGNOSIS — E1169 Type 2 diabetes mellitus with other specified complication: Secondary | ICD-10-CM | POA: Diagnosis not present

## 2022-03-14 DIAGNOSIS — I350 Nonrheumatic aortic (valve) stenosis: Secondary | ICD-10-CM | POA: Diagnosis not present

## 2022-03-14 DIAGNOSIS — E1122 Type 2 diabetes mellitus with diabetic chronic kidney disease: Secondary | ICD-10-CM | POA: Diagnosis not present

## 2022-03-14 DIAGNOSIS — Z794 Long term (current) use of insulin: Secondary | ICD-10-CM | POA: Diagnosis not present

## 2022-03-14 LAB — BASIC METABOLIC PANEL
BUN/Creatinine Ratio: 18 (ref 10–24)
BUN: 38 mg/dL — ABNORMAL HIGH (ref 8–27)
CO2: 22 mmol/L (ref 20–29)
Calcium: 9.3 mg/dL (ref 8.6–10.2)
Chloride: 99 mmol/L (ref 96–106)
Creatinine, Ser: 2.14 mg/dL — ABNORMAL HIGH (ref 0.76–1.27)
Glucose: 128 mg/dL — ABNORMAL HIGH (ref 70–99)
Potassium: 5.6 mmol/L — ABNORMAL HIGH (ref 3.5–5.2)
Sodium: 133 mmol/L — ABNORMAL LOW (ref 134–144)
eGFR: 30 mL/min/{1.73_m2} — ABNORMAL LOW (ref >59)

## 2022-03-14 LAB — CBC
Hematocrit: 26.8 % — ABNORMAL LOW (ref 37.5–51.0)
Hemoglobin: 9.1 g/dL — ABNORMAL LOW (ref 13.0–17.7)
MCH: 29.4 pg (ref 26.6–33.0)
MCHC: 34 g/dL (ref 31.5–35.7)
MCV: 87 fL (ref 79–97)
Platelets: 227 10*3/uL (ref 150–450)
RBC: 3.1 x10E6/uL — ABNORMAL LOW (ref 4.14–5.80)
RDW: 13.2 % (ref 11.6–15.4)
WBC: 6 10*3/uL (ref 3.4–10.8)

## 2022-03-17 DIAGNOSIS — I129 Hypertensive chronic kidney disease with stage 1 through stage 4 chronic kidney disease, or unspecified chronic kidney disease: Secondary | ICD-10-CM | POA: Diagnosis not present

## 2022-03-17 DIAGNOSIS — Z794 Long term (current) use of insulin: Secondary | ICD-10-CM | POA: Diagnosis not present

## 2022-03-17 DIAGNOSIS — E1169 Type 2 diabetes mellitus with other specified complication: Secondary | ICD-10-CM | POA: Diagnosis not present

## 2022-03-17 DIAGNOSIS — N184 Chronic kidney disease, stage 4 (severe): Secondary | ICD-10-CM | POA: Diagnosis not present

## 2022-03-17 DIAGNOSIS — E1122 Type 2 diabetes mellitus with diabetic chronic kidney disease: Secondary | ICD-10-CM | POA: Diagnosis not present

## 2022-03-17 DIAGNOSIS — I350 Nonrheumatic aortic (valve) stenosis: Secondary | ICD-10-CM | POA: Diagnosis not present

## 2022-03-19 DIAGNOSIS — N184 Chronic kidney disease, stage 4 (severe): Secondary | ICD-10-CM | POA: Diagnosis not present

## 2022-03-19 DIAGNOSIS — E1169 Type 2 diabetes mellitus with other specified complication: Secondary | ICD-10-CM | POA: Diagnosis not present

## 2022-03-19 DIAGNOSIS — I129 Hypertensive chronic kidney disease with stage 1 through stage 4 chronic kidney disease, or unspecified chronic kidney disease: Secondary | ICD-10-CM | POA: Diagnosis not present

## 2022-03-19 DIAGNOSIS — Z794 Long term (current) use of insulin: Secondary | ICD-10-CM | POA: Diagnosis not present

## 2022-03-19 DIAGNOSIS — I350 Nonrheumatic aortic (valve) stenosis: Secondary | ICD-10-CM | POA: Diagnosis not present

## 2022-03-19 DIAGNOSIS — E1122 Type 2 diabetes mellitus with diabetic chronic kidney disease: Secondary | ICD-10-CM | POA: Diagnosis not present

## 2022-03-26 ENCOUNTER — Other Ambulatory Visit: Payer: Self-pay | Admitting: Hematology and Oncology

## 2022-03-26 DIAGNOSIS — D649 Anemia, unspecified: Secondary | ICD-10-CM

## 2022-03-27 ENCOUNTER — Encounter: Payer: Self-pay | Admitting: Hematology and Oncology

## 2022-03-27 ENCOUNTER — Inpatient Hospital Stay: Payer: Medicare Other | Attending: Hematology and Oncology | Admitting: Hematology and Oncology

## 2022-03-27 ENCOUNTER — Inpatient Hospital Stay: Payer: Medicare Other

## 2022-03-27 ENCOUNTER — Ambulatory Visit: Payer: Medicare Other | Admitting: Cardiology

## 2022-03-27 VITALS — BP 228/92 | HR 79 | Temp 98.0°F | Resp 18 | Ht 69.0 in | Wt 164.7 lb

## 2022-03-27 DIAGNOSIS — D631 Anemia in chronic kidney disease: Secondary | ICD-10-CM | POA: Diagnosis not present

## 2022-03-27 DIAGNOSIS — Z91148 Patient's other noncompliance with medication regimen for other reason: Secondary | ICD-10-CM | POA: Diagnosis not present

## 2022-03-27 DIAGNOSIS — R079 Chest pain, unspecified: Secondary | ICD-10-CM | POA: Diagnosis not present

## 2022-03-27 DIAGNOSIS — Z8546 Personal history of malignant neoplasm of prostate: Secondary | ICD-10-CM | POA: Diagnosis not present

## 2022-03-27 DIAGNOSIS — I350 Nonrheumatic aortic (valve) stenosis: Secondary | ICD-10-CM | POA: Diagnosis not present

## 2022-03-27 DIAGNOSIS — N179 Acute kidney failure, unspecified: Secondary | ICD-10-CM | POA: Diagnosis not present

## 2022-03-27 DIAGNOSIS — Z955 Presence of coronary angioplasty implant and graft: Secondary | ICD-10-CM | POA: Diagnosis not present

## 2022-03-27 DIAGNOSIS — K219 Gastro-esophageal reflux disease without esophagitis: Secondary | ICD-10-CM | POA: Diagnosis not present

## 2022-03-27 DIAGNOSIS — E1122 Type 2 diabetes mellitus with diabetic chronic kidney disease: Secondary | ICD-10-CM | POA: Diagnosis not present

## 2022-03-27 DIAGNOSIS — I130 Hypertensive heart and chronic kidney disease with heart failure and stage 1 through stage 4 chronic kidney disease, or unspecified chronic kidney disease: Secondary | ICD-10-CM | POA: Diagnosis not present

## 2022-03-27 DIAGNOSIS — K861 Other chronic pancreatitis: Secondary | ICD-10-CM | POA: Diagnosis not present

## 2022-03-27 DIAGNOSIS — Z7982 Long term (current) use of aspirin: Secondary | ICD-10-CM | POA: Diagnosis not present

## 2022-03-27 DIAGNOSIS — I2510 Atherosclerotic heart disease of native coronary artery without angina pectoris: Secondary | ICD-10-CM | POA: Diagnosis not present

## 2022-03-27 DIAGNOSIS — E119 Type 2 diabetes mellitus without complications: Secondary | ICD-10-CM | POA: Diagnosis not present

## 2022-03-27 DIAGNOSIS — Z7902 Long term (current) use of antithrombotics/antiplatelets: Secondary | ICD-10-CM | POA: Diagnosis not present

## 2022-03-27 DIAGNOSIS — N183 Chronic kidney disease, stage 3 unspecified: Secondary | ICD-10-CM | POA: Diagnosis not present

## 2022-03-27 DIAGNOSIS — Z794 Long term (current) use of insulin: Secondary | ICD-10-CM | POA: Diagnosis not present

## 2022-03-27 DIAGNOSIS — I252 Old myocardial infarction: Secondary | ICD-10-CM | POA: Diagnosis not present

## 2022-03-27 DIAGNOSIS — I340 Nonrheumatic mitral (valve) insufficiency: Secondary | ICD-10-CM | POA: Diagnosis not present

## 2022-03-27 DIAGNOSIS — D649 Anemia, unspecified: Secondary | ICD-10-CM

## 2022-03-27 DIAGNOSIS — R0789 Other chest pain: Secondary | ICD-10-CM | POA: Diagnosis not present

## 2022-03-27 DIAGNOSIS — I10 Essential (primary) hypertension: Secondary | ICD-10-CM | POA: Diagnosis not present

## 2022-03-27 DIAGNOSIS — E7800 Pure hypercholesterolemia, unspecified: Secondary | ICD-10-CM | POA: Diagnosis not present

## 2022-03-27 DIAGNOSIS — M1990 Unspecified osteoarthritis, unspecified site: Secondary | ICD-10-CM | POA: Diagnosis not present

## 2022-03-27 DIAGNOSIS — J449 Chronic obstructive pulmonary disease, unspecified: Secondary | ICD-10-CM | POA: Diagnosis not present

## 2022-03-27 DIAGNOSIS — N189 Chronic kidney disease, unspecified: Secondary | ICD-10-CM | POA: Diagnosis not present

## 2022-03-27 DIAGNOSIS — I351 Nonrheumatic aortic (valve) insufficiency: Secondary | ICD-10-CM | POA: Diagnosis not present

## 2022-03-27 DIAGNOSIS — I441 Atrioventricular block, second degree: Secondary | ICD-10-CM | POA: Diagnosis not present

## 2022-03-27 DIAGNOSIS — I25118 Atherosclerotic heart disease of native coronary artery with other forms of angina pectoris: Secondary | ICD-10-CM | POA: Diagnosis not present

## 2022-03-27 DIAGNOSIS — I160 Hypertensive urgency: Secondary | ICD-10-CM | POA: Diagnosis not present

## 2022-03-27 DIAGNOSIS — E785 Hyperlipidemia, unspecified: Secondary | ICD-10-CM | POA: Insufficient documentation

## 2022-03-27 DIAGNOSIS — I5032 Chronic diastolic (congestive) heart failure: Secondary | ICD-10-CM | POA: Diagnosis not present

## 2022-03-27 DIAGNOSIS — D638 Anemia in other chronic diseases classified elsewhere: Secondary | ICD-10-CM | POA: Diagnosis not present

## 2022-03-27 DIAGNOSIS — Z79899 Other long term (current) drug therapy: Secondary | ICD-10-CM | POA: Diagnosis not present

## 2022-03-27 DIAGNOSIS — I119 Hypertensive heart disease without heart failure: Secondary | ICD-10-CM | POA: Diagnosis not present

## 2022-03-27 LAB — COMPREHENSIVE METABOLIC PANEL
Albumin: 3.7 (ref 3.5–5.0)
Calcium: 9.1 (ref 8.7–10.7)

## 2022-03-27 LAB — HEPATIC FUNCTION PANEL
ALT: 27 U/L (ref 10–40)
AST: 38 (ref 14–40)
Alkaline Phosphatase: 101 (ref 25–125)
Bilirubin, Total: 0.4

## 2022-03-27 LAB — CBC AND DIFFERENTIAL
HCT: 28 — AB (ref 41–53)
Hemoglobin: 9.2 — AB (ref 13.5–17.5)
Neutrophils Absolute: 5.24
Platelets: 250 10*3/uL (ref 150–400)
WBC: 7.6

## 2022-03-27 LAB — BASIC METABOLIC PANEL
BUN: 41 — AB (ref 4–21)
CO2: 24 — AB (ref 13–22)
Chloride: 107 (ref 99–108)
Creatinine: 2 — AB (ref 0.6–1.3)
Glucose: 202
Potassium: 4.6 mEq/L (ref 3.5–5.1)
Sodium: 138 (ref 137–147)

## 2022-03-27 LAB — CBC
MCV: 88 (ref 80–94)
RBC: 3.14 — AB (ref 3.87–5.11)

## 2022-03-27 LAB — IRON AND TIBC
Iron: 51 ug/dL (ref 45–182)
Saturation Ratios: 19 % (ref 17.9–39.5)
TIBC: 272 ug/dL (ref 250–450)
UIBC: 221 ug/dL

## 2022-03-27 LAB — FOLATE: Folate: >40 ng/mL (ref >5.9)

## 2022-03-27 LAB — LACTATE DEHYDROGENASE: LDH: 201 U/L — ABNORMAL HIGH (ref 98–192)

## 2022-03-27 LAB — FERRITIN: Ferritin: 103 ng/mL (ref 24–336)

## 2022-03-27 LAB — VITAMIN B12: Vitamin B-12: 501 pg/mL (ref 180–914)

## 2022-03-27 NOTE — Progress Notes (Cosign Needed)
[name]  [address] [place],    [address] [id number]  Clinic Day:  03/27/2022  Referring physician: Emmaline Kluver, *   REASON FOR CONSULTATION:  Anemia due to chronic kidney disease  HISTORY OF PRESENT ILLNESS:  Troy [name] is a 86 y.o. male with worsening anemia felt to be secondary to chronic kidney disease, who is referred in consultation by Dr. Christa See for consideration of epoetin. His hemoglobin had been running around 10 to 11.  His hemoglobin decreased to 8.5 in July after he underwent coronary artery stent placement.  He was transfused while hospitalized when his hemoglobin dropped to 8.5.  His hemoglobin was 12 at discharge. On August 9th, his hemoglobin dropped to 9.6 and was 9.1 on August 18th.  He denies any overt form of blood loss.  In the past week, he has had intermittent shortness of breath and chest pain.   He is currently undergoing cardiac rehab and had an episode of chest pain during rehab. He has used nitroglycerin as needed.  He states his systolic blood pressure was 210 last night, but he did not have any associated symptoms, so he took nitroglycerin and went back to bed.  He denies current shortness of breath or chest pain. He denies headache or vision changes but states he is legally blind.  In addition to above, he has a gives a history of prostate cancer treated with radiation, acute myocardial infarction, second degree heart block, peripheral vascular disease, diabetes, hyperlipemia, GERD, chronic pancreatitis after episode of necrotizing pancreatitis in 2016. He is status post previous coronary artery stents, cholecystectomy, tonsillectomy, and common iliac stent. He denies any family history of malignancy.  REVIEW OF SYSTEMS:  Review of Systems  Constitutional:  Positive for fatigue. Negative for appetite change, chills, fever and unexpected weight change.  HENT:   Negative for lump/mass, mouth  sores and sore throat.   Respiratory:  Positive for shortness of breath (Intermittent). Negative for cough.   Cardiovascular:  Positive for chest pain (Intermittent, relieved with nitro). Negative for leg swelling.  Gastrointestinal:  Negative for abdominal pain, constipation, diarrhea, nausea and vomiting.  Genitourinary:  Negative for difficulty urinating, dysuria, frequency and hematuria.   Musculoskeletal:  Negative for arthralgias, back pain and myalgias.  Skin:  Negative for itching, rash and wound.  Neurological:  Negative for dizziness, extremity weakness, headaches, light-headedness and numbness.  Hematological:  Negative for adenopathy.  Psychiatric/Behavioral:  Negative for depression and sleep disturbance. The patient is nervous/anxious.      VITALS:  Blood pressure (!) 228/92, pulse 79, temperature 98 F (36.7 C), temperature source Oral, resp. rate 18, height '5\' 9"'$  (1.753 m), weight 164 lb 11.2 oz (74.7 kg), SpO2 98 %.  Wt Readings from Last 3 Encounters:  03/27/22 164 lb 11.2 oz (74.7 kg)  02/19/22 164 lb (74.4 kg)  02/15/22 155 lb 3.3 oz (70.4 kg)    Body mass index is 24.32 kg/m.  Performance status (ECOG): 1 - Symptomatic but completely ambulatory  PHYSICAL EXAM:  Physical Exam Vitals and nursing note reviewed.  Constitutional:      General: He is not in acute distress.    Appearance: Normal appearance. He is normal weight.  HENT:     Head: Normocephalic and atraumatic.     Mouth/Throat:     Mouth: Mucous membranes are moist.     Pharynx: Oropharynx is clear. No oropharyngeal exudate or posterior oropharyngeal erythema.  Eyes:  General: No scleral icterus.    Extraocular Movements: Extraocular movements intact.     Conjunctiva/sclera: Conjunctivae normal.     Pupils: Pupils are equal, round, and reactive to light.  Cardiovascular:     Rate and Rhythm: Normal rate and regular rhythm. Occasional Extrasystoles are present.    Heart sounds: Murmur heard.      Systolic murmur is present with a grade of 3/6.     No friction rub. No gallop.  Pulmonary:     Effort: Pulmonary effort is normal.     Breath sounds: Normal breath sounds. No wheezing, rhonchi or rales.  Abdominal:     General: Bowel sounds are normal. There is no distension.     Palpations: Abdomen is soft. There is no hepatomegaly, splenomegaly or mass.     Tenderness: There is no abdominal tenderness.  Musculoskeletal:        General: Normal range of motion.     Cervical back: Normal range of motion and neck supple. No tenderness.     Right lower leg: No edema.     Left lower leg: No edema.  Lymphadenopathy:     Cervical: No cervical adenopathy.     Upper Body:     Right upper body: No supraclavicular or axillary adenopathy.     Left upper body: No supraclavicular or axillary adenopathy.     Lower Body: No right inguinal adenopathy. No left inguinal adenopathy.  Skin:    General: Skin is warm and dry.     Coloration: Skin is not jaundiced.     Findings: No rash.  Neurological:     Mental Status: He is alert and oriented to person, place, and time.     Cranial Nerves: No cranial nerve deficit.  Psychiatric:        Mood and Affect: Mood normal.        Behavior: Behavior normal.        Thought Content: Thought content normal.      LABS:      Latest Ref Rng & Units 03/27/2022   12:00 AM 03/14/2022   10:29 AM 03/05/2022   11:23 AM  CBC  WBC  7.6     6.0  6.6   Hemoglobin 13.5 - 17.5 9.2     9.1  9.6   Hematocrit 41 - 53 28     26.8  29.6   Platelets 150 - 400 K/uL 250     227  330      This result is from an external source.      Latest Ref Rng & Units 03/27/2022   12:00 AM 03/14/2022   10:29 AM 03/05/2022   11:23 AM  CMP  Glucose 70 - 99 mg/dL  128  116   BUN 4 - 21 41     38  37   Creatinine 0.6 - 1.3 2.0     2.14  2.09   Sodium 137 - 147 138     133  137   Potassium 3.5 - 5.1 mEq/L 4.6     5.6  5.4   Chloride 99 - 108 107     99  101   CO2 13 - '22 24     22   20   '$ Calcium 8.7 - 10.7 9.1     9.3  9.8   Alkaline Phos 25 - 125 101        AST 14 - 40 38  ALT 10 - 40 U/L 27           This result is from an external source.     No results found for: "CEA1", "CEA" / No results found for: "CEA1", "CEA" No results found for: "PSA1" No results found for: "ZOX096" No results found for: "CAN125"  No results found for: "TOTALPROTELP", "ALBUMINELP", "A1GS", "A2GS", "BETS", "BETA2SER", "GAMS", "MSPIKE", "SPEI" Lab Results  Component Value Date   TIBC 276 02/11/2022   TIBC 267 02/10/2022   FERRITIN 120 02/11/2022   FERRITIN 101 02/10/2022   IRONPCTSAT 27 02/11/2022   IRONPCTSAT 17 (L) 02/10/2022   No results found for: "LDH"  STUDIES:  No results found.    HISTORY:   Past Medical History:  Diagnosis Date   Anemia 12/09/2015   Atherosclerosis    CAD in native artery 12/09/2015   Overview:  PCI/ stent 2001   Chronic diastolic heart failure ([facility]) 12/09/2015   Chronic pancreatitis ([facility])    CKD (chronic kidney disease)    3B   Coronary artery disease involving native coronary artery of native heart with angina pectoris ([facility]) 12/09/2015   Overview:  PCI/ stent 2001   Diabetes ([facility])    GERD (gastroesophageal reflux disease)    Hearing loss    Hyperlipidemia 12/09/2015   Hypertensive heart disease with heart failure ([facility]) 12/09/2015   Macular degeneration    legally blind   MI (myocardial infarction) ([facility])    Necrotizing pancreatitis 06/26/2015   Prostate cancer ([facility])    radiation treatments here   Pseudocyst of pancreas 06/26/2015   PVD (peripheral vascular disease) ([facility])    Second degree heart block     Past Surgical History:  Procedure Laterality Date   ABDOMINAL AORTOGRAM W/LOWER EXTREMITY Bilateral 09/10/2018   Procedure: ABDOMINAL AORTOGRAM W/LOWER EXTREMITY;  Surgeon: Elam Dutch, MD;  Location: [facility];  Service: Cardiovascular;  Laterality: Bilateral;   CHOLECYSTECTOMY     CORONARY STENT INTERVENTION  N/A 02/14/2022   Procedure: CORONARY STENT INTERVENTION;  Surgeon: Nelva Bush, MD;  Location: [facility];  Service: Cardiovascular;  Laterality: N/A;   INTRAVASCULAR ULTRASOUND/IVUS N/A 02/14/2022   Procedure: Intravascular Ultrasound/IVUS;  Surgeon: Nelva Bush, MD;  Location: [facility];  Service: Cardiovascular;  Laterality: N/A;   LEFT HEART CATH N/A 02/14/2022   Procedure: Left Heart Cath;  Surgeon: Nelva Bush, MD;  Location: [facility];  Service: Cardiovascular;  Laterality: N/A;   LEFT HEART CATH AND CORONARY ANGIOGRAPHY N/A 02/12/2022   Procedure: LEFT HEART CATH AND CORONARY ANGIOGRAPHY;  Surgeon: Wellington Hampshire, MD;  Location: [facility];  Service: Cardiovascular;  Laterality: N/A;   PERIPHERAL VASCULAR INTERVENTION Right 09/10/2018   Procedure: PERIPHERAL VASCULAR INTERVENTION;  Surgeon: Elam Dutch, MD;  Location: [facility];  Service: Cardiovascular;  Laterality: Right;  common iliac   TONSILLECTOMY AND ADENOIDECTOMY     TOTAL KNEE REVISION Bilateral     Family History  Problem Relation Age of Onset   Heart failure Mother    Diabetes Mother    Heart failure Father     Social History:  reports that he has never smoked. He has been exposed to tobacco smoke. He has never used smokeless tobacco. He reports that he does not drink alcohol and does not use drugs.The patient is accompanied by his wife today.  Allergies: No Known Allergies  Current Medications: Current Outpatient Medications  Medication Sig Dispense Refill   dapagliflozin propanediol (  FARXIGA) 10 MG TABS tablet Take by mouth daily. Currently on hold after stent placed per patient     Multiple Vitamin (MULTIVITAMIN) tablet Take 1 tablet by mouth daily.     acetaminophen (TYLENOL) 500 MG tablet Take 500-1,000 mg by mouth every 6 (six) hours as needed for moderate pain.     amLODipine (NORVASC) 5 MG tablet Take 1 tablet (5 mg total) by mouth in the morning  and at bedtime. 180 tablet 2   aspirin EC 81 MG tablet Take 1 tablet (81 mg total) by mouth daily. Swallow whole. 30 tablet 12   atorvastatin (LIPITOR) 80 MG tablet TAKE 1 TABLET EACH EVENING 90 tablet 1   B Complex Vitamins (VITAMIN B COMPLEX) TABS Take 1 tablet by mouth daily.     Cholecalciferol (VITAMIN D3) 50 MCG (2000 UT) TABS Take 4,000 Units by mouth daily.     Cinnamon 500 MG capsule Take 500 mg by mouth daily.     cloNIDine (CATAPRES) 0.1 MG tablet Take 1 tablet (0.1 mg total) by mouth 2 (two) times daily. 180 tablet 3   clopidogrel (PLAVIX) 75 MG tablet Take 75 mg by mouth daily.     co-enzyme Q-10 30 MG capsule Take 30 mg by mouth daily.     doxazosin (CARDURA) 2 MG tablet Take 2 mg by mouth at bedtime.      hydrALAZINE (APRESOLINE) 25 MG tablet Take one tablet by mouth daily in the morning and at 12pm. Take two tablets by mouth daily at bedtime. 270 tablet 2   insulin glargine (LANTUS) 100 UNIT/ML injection Inject 20 Units into the skin daily.     Iron Combinations (IRON COMPLEX PO) Take 65 mg by mouth daily.     lisinopril (ZESTRIL) 20 MG tablet TAKE ONE TABLET BY MOUTH every morning 90 tablet 2   Multiple Vitamins-Minerals (EYE VITAMINS PO) Take 1 tablet by mouth daily.     nitroGLYCERIN (NITROSTAT) 0.4 MG SL tablet Place 1 tablet (0.4 mg total) under the tongue every 5 (five) minutes as needed for chest pain. 25 tablet 4   omeprazole (PRILOSEC) 40 MG capsule Take 40 mg by mouth daily.     Pancrelipase, Lip-Prot-Amyl, (CREON) 24000-76000 units CPEP Take 1 capsule by mouth in the morning, at noon, and at bedtime.     Propylene Glycol (SYSTANE BALANCE) 0.6 % SOLN Place 1 drop into both eyes at bedtime.     vitamin C (ASCORBIC ACID) 500 MG tablet Take 500 mg by mouth daily.     No current facility-administered medications for this visit.     ASSESSMENT & PLAN:   Assessment:/Plan:  Troy [name] is a 86 y.o. male with anemia most likely due to chronic kidney disease.  Complete  evaluation is pending from today.  I discussed the possible use of epoetin injections with the patient and his wife and gave them written information to review.  Due to his significant hypertension, I contacted Dr. Bettina Gavia who recommended the patient be evaluated further in the emergency room, so he was transferred there.  I will plan to see him back in 1 week to review the results of testing.  I discussed the assessment and plan with the patient and his wife.  They were more provided an opportunity to ask questions and all were answered.  The patient agreed with the plan and demonstrated an understanding of the instructions.    Thank you for the referral.  I provided 45 minutes of face-to-face time during  this encounter and > 50% was spent counseling as documented under my assessment and plan.    Marvia Pickles, PA-C

## 2022-03-28 ENCOUNTER — Telehealth: Payer: Self-pay

## 2022-03-28 DIAGNOSIS — R079 Chest pain, unspecified: Secondary | ICD-10-CM | POA: Diagnosis not present

## 2022-03-28 DIAGNOSIS — I5032 Chronic diastolic (congestive) heart failure: Secondary | ICD-10-CM | POA: Diagnosis not present

## 2022-03-28 DIAGNOSIS — E119 Type 2 diabetes mellitus without complications: Secondary | ICD-10-CM | POA: Diagnosis not present

## 2022-03-28 DIAGNOSIS — N179 Acute kidney failure, unspecified: Secondary | ICD-10-CM | POA: Diagnosis not present

## 2022-03-28 DIAGNOSIS — I2510 Atherosclerotic heart disease of native coronary artery without angina pectoris: Secondary | ICD-10-CM | POA: Diagnosis not present

## 2022-03-28 DIAGNOSIS — I351 Nonrheumatic aortic (valve) insufficiency: Secondary | ICD-10-CM | POA: Diagnosis not present

## 2022-03-28 DIAGNOSIS — I160 Hypertensive urgency: Secondary | ICD-10-CM | POA: Diagnosis not present

## 2022-03-28 DIAGNOSIS — I340 Nonrheumatic mitral (valve) insufficiency: Secondary | ICD-10-CM | POA: Diagnosis not present

## 2022-03-28 DIAGNOSIS — D649 Anemia, unspecified: Secondary | ICD-10-CM

## 2022-03-28 DIAGNOSIS — I130 Hypertensive heart and chronic kidney disease with heart failure and stage 1 through stage 4 chronic kidney disease, or unspecified chronic kidney disease: Secondary | ICD-10-CM | POA: Diagnosis not present

## 2022-03-28 NOTE — Telephone Encounter (Signed)
Family answered patient present , Patient is in [facility] hospital admitted ,blood pressure is better still being evaluated. Follow-up call after  we transferred him to  to the ED last night .

## 2022-03-29 DIAGNOSIS — I119 Hypertensive heart disease without heart failure: Secondary | ICD-10-CM | POA: Diagnosis not present

## 2022-03-29 DIAGNOSIS — I441 Atrioventricular block, second degree: Secondary | ICD-10-CM | POA: Diagnosis not present

## 2022-03-29 DIAGNOSIS — I5032 Chronic diastolic (congestive) heart failure: Secondary | ICD-10-CM | POA: Diagnosis not present

## 2022-03-29 DIAGNOSIS — D638 Anemia in other chronic diseases classified elsewhere: Secondary | ICD-10-CM

## 2022-03-29 DIAGNOSIS — I350 Nonrheumatic aortic (valve) stenosis: Secondary | ICD-10-CM

## 2022-03-29 DIAGNOSIS — I130 Hypertensive heart and chronic kidney disease with heart failure and stage 1 through stage 4 chronic kidney disease, or unspecified chronic kidney disease: Secondary | ICD-10-CM | POA: Diagnosis not present

## 2022-03-29 DIAGNOSIS — N183 Chronic kidney disease, stage 3 unspecified: Secondary | ICD-10-CM | POA: Diagnosis not present

## 2022-03-29 DIAGNOSIS — I2510 Atherosclerotic heart disease of native coronary artery without angina pectoris: Secondary | ICD-10-CM | POA: Diagnosis not present

## 2022-03-29 DIAGNOSIS — N179 Acute kidney failure, unspecified: Secondary | ICD-10-CM | POA: Diagnosis not present

## 2022-03-30 DIAGNOSIS — N179 Acute kidney failure, unspecified: Secondary | ICD-10-CM | POA: Diagnosis not present

## 2022-03-30 DIAGNOSIS — I5032 Chronic diastolic (congestive) heart failure: Secondary | ICD-10-CM | POA: Diagnosis not present

## 2022-03-30 DIAGNOSIS — I130 Hypertensive heart and chronic kidney disease with heart failure and stage 1 through stage 4 chronic kidney disease, or unspecified chronic kidney disease: Secondary | ICD-10-CM | POA: Diagnosis not present

## 2022-03-30 DIAGNOSIS — N183 Chronic kidney disease, stage 3 unspecified: Secondary | ICD-10-CM | POA: Diagnosis not present

## 2022-03-30 DIAGNOSIS — I2510 Atherosclerotic heart disease of native coronary artery without angina pectoris: Secondary | ICD-10-CM | POA: Diagnosis not present

## 2022-03-30 DIAGNOSIS — I119 Hypertensive heart disease without heart failure: Secondary | ICD-10-CM | POA: Diagnosis not present

## 2022-03-30 DIAGNOSIS — I441 Atrioventricular block, second degree: Secondary | ICD-10-CM | POA: Diagnosis not present

## 2022-03-30 LAB — GLUCOSE, CAPILLARY: POC Glucose: 107 mg/dl — AB (ref 70–99)

## 2022-04-02 LAB — PROTEIN ELECTROPHORESIS, SERUM
A/G Ratio: 1.1 (ref 0.7–1.7)
Albumin ELP: 3.4 g/dL (ref 2.9–4.4)
Alpha-1-Globulin: 0.2 g/dL (ref 0.0–0.4)
Alpha-2-Globulin: 0.6 g/dL (ref 0.4–1.0)
Beta Globulin: 0.8 g/dL (ref 0.7–1.3)
Gamma Globulin: 1.4 g/dL (ref 0.4–1.8)
Globulin, Total: 3.1 g/dL (ref 2.2–3.9)
Total Protein ELP: 6.5 g/dL (ref 6.0–8.5)

## 2022-04-03 DIAGNOSIS — Z794 Long term (current) use of insulin: Secondary | ICD-10-CM | POA: Diagnosis not present

## 2022-04-03 DIAGNOSIS — N1832 Chronic kidney disease, stage 3b: Secondary | ICD-10-CM | POA: Diagnosis not present

## 2022-04-03 DIAGNOSIS — E1122 Type 2 diabetes mellitus with diabetic chronic kidney disease: Secondary | ICD-10-CM | POA: Diagnosis not present

## 2022-04-03 DIAGNOSIS — I5022 Chronic systolic (congestive) heart failure: Secondary | ICD-10-CM | POA: Diagnosis not present

## 2022-04-14 ENCOUNTER — Other Ambulatory Visit: Payer: Self-pay

## 2022-04-14 ENCOUNTER — Other Ambulatory Visit: Payer: Self-pay | Admitting: Hematology and Oncology

## 2022-04-14 ENCOUNTER — Telehealth: Payer: Self-pay | Admitting: Hematology and Oncology

## 2022-04-14 ENCOUNTER — Ambulatory Visit: Payer: Medicare Other | Attending: Cardiology | Admitting: Cardiology

## 2022-04-14 ENCOUNTER — Encounter: Payer: Self-pay | Admitting: Cardiology

## 2022-04-14 VITALS — BP 158/76 | HR 80 | Ht 69.5 in | Wt 164.6 lb

## 2022-04-14 DIAGNOSIS — R001 Bradycardia, unspecified: Secondary | ICD-10-CM | POA: Diagnosis not present

## 2022-04-14 DIAGNOSIS — N184 Chronic kidney disease, stage 4 (severe): Secondary | ICD-10-CM | POA: Diagnosis not present

## 2022-04-14 DIAGNOSIS — D631 Anemia in chronic kidney disease: Secondary | ICD-10-CM

## 2022-04-14 DIAGNOSIS — I350 Nonrheumatic aortic (valve) stenosis: Secondary | ICD-10-CM | POA: Insufficient documentation

## 2022-04-14 DIAGNOSIS — I110 Hypertensive heart disease with heart failure: Secondary | ICD-10-CM | POA: Diagnosis not present

## 2022-04-14 DIAGNOSIS — N1832 Chronic kidney disease, stage 3b: Secondary | ICD-10-CM | POA: Insufficient documentation

## 2022-04-14 DIAGNOSIS — I25119 Atherosclerotic heart disease of native coronary artery with unspecified angina pectoris: Secondary | ICD-10-CM | POA: Insufficient documentation

## 2022-04-14 DIAGNOSIS — K219 Gastro-esophageal reflux disease without esophagitis: Secondary | ICD-10-CM | POA: Insufficient documentation

## 2022-04-14 DIAGNOSIS — H3530 Unspecified macular degeneration: Secondary | ICD-10-CM | POA: Insufficient documentation

## 2022-04-14 DIAGNOSIS — C61 Malignant neoplasm of prostate: Secondary | ICD-10-CM | POA: Insufficient documentation

## 2022-04-14 DIAGNOSIS — I441 Atrioventricular block, second degree: Secondary | ICD-10-CM | POA: Insufficient documentation

## 2022-04-14 DIAGNOSIS — I739 Peripheral vascular disease, unspecified: Secondary | ICD-10-CM | POA: Insufficient documentation

## 2022-04-14 MED ORDER — ISOSORBIDE DINITRATE ER 40 MG PO TBCR: 40.0000 mg | EXTENDED_RELEASE_TABLET | Freq: Three times a day (TID) | ORAL | 12 refills | Status: DC

## 2022-04-14 NOTE — Telephone Encounter (Signed)
04/14/22 Left msg for upcoming appt

## 2022-04-14 NOTE — Patient Instructions (Addendum)
Medication Instructions:  Your physician has recommended you make the following change in your medication:   Stop Aspirin  Stop Imdur  Start Isosorbide 40 mg three times daily.  *If you need a refill on your cardiac medications before your next appointment, please call your pharmacy*   Lab Work: Your physician recommends that you have a BMP and CBC done today.  If you have labs (blood work) drawn today and your tests are completely normal, you will receive your results only by: [facility] (if you have MyChart) OR A paper copy in the mail If you have any lab test that is abnormal or we need to change your treatment, we will call you to review the results.   Testing/Procedures: None ordered   Follow-Up: At [facility], you and your health needs are our priority.  As part of our continuing mission to provide you with exceptional heart care, we have created designated Provider Care Teams.  These Care Teams include your primary Cardiologist (physician) and Advanced Practice Providers (APPs -  Physician Assistants and Nurse Practitioners) who all work together to provide you with the care you need, when you need it.  We recommend signing up for the patient portal called "MyChart".  Sign up information is provided on this After Visit Summary.  MyChart is used to connect with patients for Virtual Visits (Telemedicine).  Patients are able to view lab/test results, encounter notes, upcoming appointments, etc.  Non-urgent messages can be sent to your provider as well.   To learn more about what you can do with MyChart, go to [email or web address].    Your next appointment:   8 week(s)  The format for your next appointment:   In Person  Provider:   Shirlee More, MD   Other Instructions NA

## 2022-04-14 NOTE — Progress Notes (Signed)
Cardiology Office Note:    Date:  04/14/2022   ID:  Troy [name], DOB 12/29/1935, MRN [id number]  PCP:  Street, [name], MD  Cardiologist:  Shirlee More, MD    Referring MD: [address] [name], *    ASSESSMENT:    1. Coronary artery disease involving native coronary artery of native heart with angina pectoris ([place])   2. Anemia due to stage 3b chronic kidney disease ([place])   3. Hypertensive heart disease with heart failure ([place])   4. CKD (chronic kidney disease) stage 4, GFR 15-29 ml/min (HCC)   5. Nonrheumatic aortic valve stenosis   6. Sinus bradycardia on ECG   7. Mobitz (type) I (Wenckebach's) atrioventricular block    PLAN:    In order of problems listed above:  He is doing better since hospitalization and transfusion like him seen by hematology to sort out the best way to treat his anemia has a clearly provoked his last admission to the hospital.  I do not think he would benefit back from resuming cardiology rehab program was switched from Imdur to isosorbide dinitrate and continue his intense medical regimen including Plavix combined hydralazine isosorbide amlodipine and his high intensity statin.  He is not on beta-blocker due to previous bradycardia second-degree AV block. Pressures controlled continue his current regimen Continue his high intensity statin Has mild aortic stenosis Not having recurrent bradycardia off beta-blocker  Next appointment: 2 months   Medication Adjustments/Labs and Tests Ordered: Current medicines are reviewed at length with the patient today.  Concerns regarding medicines are outlined above.  Orders Placed This Encounter  Procedures   Basic metabolic panel   CBC   Meds ordered this encounter  Medications   isosorbide dinitrate (ISOCHRON) 40 MG CR tablet    Sig: Take 1 tablet (40 mg total) by mouth 3 (three) times daily.    Dispense:  90 tablet    Refill:  12    Chief Complaint  Patient presents with   Follow-up    Coronary Artery Disease   Anemia    History of Present Illness:    Troy [name] is a 86 y.o. male with a hx of hypertensive heart disease with heart failure and stage IV CKD CAD with previous PCI and recent PCI and stent for ACS 02/12/2022 mild aortic stenosis and previous symptomatic bradycardia resolved stopping a beta-blocker  last seen inpatient [facility] earlier this month admitted 03/27/2022 discharge 03/30/2022.  He was recently admitted to [facility] with symptomatic anemia associated with his CKD and was transfused 2 unit of packed cells.  His iron studies show a saturation of 19 and a ferritin of 103 he has been seen by hematology [place] as an outpatient Compliance with diet, lifestyle and medications: Yes  His wife is present retired cardiology RN He is doing better not having angina and is doing light activity at home No edema shortness of breath palpitation or syncope He has had very difficult to control hypertension and he runs less than 944 systolic at home and heart rates which previously bradycardic are usually in the 80 to 90 bpm range And not had follow-up with hematology Past Medical History:  Diagnosis Date   Anemia 12/09/2015   Atherosclerosis    CAD in native artery 12/09/2015   Overview:  PCI/ stent 2001   Chronic diastolic heart failure ([place]) 12/09/2015   Chronic pancreatitis ([place])    CKD (chronic kidney disease)    3B   Coronary artery disease  involving native coronary artery of native heart with angina pectoris ([place]) 12/09/2015   Overview:  PCI/ stent 2001   Diabetes ([place])    GERD (gastroesophageal reflux disease)    Hearing loss    Hyperlipidemia 12/09/2015   Hypertensive heart disease with heart failure ([place]) 12/09/2015   Macular degeneration    legally blind   MI (myocardial infarction) ([place])    Necrotizing pancreatitis 06/26/2015   Prostate cancer ([place])    radiation treatments here   Pseudocyst of pancreas 06/26/2015   PVD  (peripheral vascular disease) ([place])    Second degree heart block     Past Surgical History:  Procedure Laterality Date   ABDOMINAL AORTOGRAM W/LOWER EXTREMITY Bilateral 09/10/2018   Procedure: ABDOMINAL AORTOGRAM W/LOWER EXTREMITY;  Surgeon: Elam Dutch, MD;  Location: [facility];  Service: Cardiovascular;  Laterality: Bilateral;   CHOLECYSTECTOMY     CORONARY STENT INTERVENTION N/A 02/14/2022   Procedure: CORONARY STENT INTERVENTION;  Surgeon: Nelva Bush, MD;  Location: [facility];  Service: Cardiovascular;  Laterality: N/A;   INTRAVASCULAR ULTRASOUND/IVUS N/A 02/14/2022   Procedure: Intravascular Ultrasound/IVUS;  Surgeon: Nelva Bush, MD;  Location: [facility];  Service: Cardiovascular;  Laterality: N/A;   LEFT HEART CATH N/A 02/14/2022   Procedure: Left Heart Cath;  Surgeon: Nelva Bush, MD;  Location: [facility];  Service: Cardiovascular;  Laterality: N/A;   LEFT HEART CATH AND CORONARY ANGIOGRAPHY N/A 02/12/2022   Procedure: LEFT HEART CATH AND CORONARY ANGIOGRAPHY;  Surgeon: Wellington Hampshire, MD;  Location: [facility];  Service: Cardiovascular;  Laterality: N/A;   PERIPHERAL VASCULAR INTERVENTION Right 09/10/2018   Procedure: PERIPHERAL VASCULAR INTERVENTION;  Surgeon: Elam Dutch, MD;  Location: [facility];  Service: Cardiovascular;  Laterality: Right;  common iliac   TONSILLECTOMY AND ADENOIDECTOMY     TOTAL KNEE REVISION Bilateral     Current Medications: Current Meds  Medication Sig   acetaminophen (TYLENOL) 500 MG tablet Take 500-1,000 mg by mouth every 6 (six) hours as needed for moderate pain.   ALPRAZolam (XANAX) 0.25 MG tablet 0.5 - 1 tablet 1-3 times daily prn   For anxiety   amLODipine (NORVASC) 5 MG tablet Take 1 tablet (5 mg total) by mouth in the morning and at bedtime.   atorvastatin (LIPITOR) 80 MG tablet TAKE 1 TABLET EACH EVENING   B Complex Vitamins (VITAMIN B COMPLEX) TABS Take 1 tablet by  mouth daily.   Cholecalciferol (VITAMIN D3) 50 MCG (2000 UT) TABS Take 4,000 Units by mouth daily.   Cinnamon 500 MG capsule Take 500 mg by mouth daily.   clopidogrel (PLAVIX) 75 MG tablet Take 75 mg by mouth daily.   co-enzyme Q-10 30 MG capsule Take 30 mg by mouth daily.   dapagliflozin propanediol (FARXIGA) 10 MG TABS tablet Take by mouth daily. Currently on hold after stent placed per patient   doxazosin (CARDURA) 2 MG tablet Take 2 mg by mouth at bedtime.    hydrALAZINE (APRESOLINE) 50 MG tablet Take 50 mg by mouth 4 (four) times daily.   insulin glargine (LANTUS) 100 UNIT/ML injection Inject 20 Units into the skin daily.   Iron Combinations (IRON COMPLEX PO) Take 65 mg by mouth daily.   isosorbide dinitrate (ISOCHRON) 40 MG CR tablet Take 1 tablet (40 mg total) by mouth 3 (three) times daily.   Multiple Vitamin (MULTIVITAMIN) tablet Take 1 tablet by mouth daily.   Multiple Vitamins-Minerals (EYE VITAMINS PO) Take 1  tablet by mouth daily.   nitroGLYCERIN (NITROSTAT) 0.4 MG SL tablet Place 1 tablet (0.4 mg total) under the tongue every 5 (five) minutes as needed for chest pain.   omeprazole (PRILOSEC) 40 MG capsule Take 40 mg by mouth daily.   Pancrelipase, Lip-Prot-Amyl, (CREON) 24000-76000 units CPEP Take 1 capsule by mouth in the morning, at noon, and at bedtime.   Propylene Glycol (SYSTANE BALANCE) 0.6 % SOLN Place 1 drop into both eyes at bedtime.   vitamin C (ASCORBIC ACID) 500 MG tablet Take 500 mg by mouth daily.   [DISCONTINUED] aspirin EC 81 MG tablet Take 1 tablet (81 mg total) by mouth daily. Swallow whole.   [DISCONTINUED] isosorbide mononitrate (IMDUR) 30 MG 24 hr tablet Take 90 mg by mouth daily. Take 3 tablets daily     Allergies:   Patient has no known allergies.   Social History   Socioeconomic History   Marital status: Married    Spouse name: [name]   Number of children: 3   Years of education: 16   Highest education level: Associate degree: academic program   Occupational History   Occupation: Retired   Occupation: retired Theme park manager  Tobacco Use   Smoking status: Never    Passive exposure: Past   Smokeless tobacco: Never  Vaping Use   Vaping Use: Never used  Substance and Sexual Activity   Alcohol use: No   Drug use: No   Sexual activity: Not on file  Other Topics Concern   Not on file  Social History Narrative   Not on file   Social Determinants of Health   Financial Resource Strain: Low Risk  (02/14/2022)   Overall Financial Resource Strain (CARDIA)    Difficulty of Paying Living Expenses: Not hard at all  Food Insecurity: No Food Insecurity (02/14/2022)   Hunger Vital Sign    Worried About Running Out of Food in the Last Year: Never true    [place] in the Last Year: Never true  Transportation Needs: No Transportation Needs (02/14/2022)   PRAPARE - Hydrologist (Medical): No    Lack of Transportation (Non-Medical): No  Physical Activity: Not on file  Stress: Not on file  Social Connections: Not on file     Family History: The patient's family history includes Diabetes in his mother; Heart failure in his father and mother. ROS:   Please see the history of present illness.    All other systems reviewed and are negative.  EKGs/Labs/Other Studies Reviewed:    The following studies were reviewed today:    Recent Labs: 02/10/2022: B Natriuretic Peptide 845.7 02/15/2022: Magnesium 1.8 03/27/2022: ALT 27; BUN 41; Creatinine 2.0; Hemoglobin 9.2; Platelets 250; Potassium 4.6; Sodium 138  Recent Lipid Panel    Component Value Date/Time   CHOL 102 02/10/2022 1607   TRIG 21 02/10/2022 1607   HDL 39 (L) 02/10/2022 1607   CHOLHDL 2.6 02/10/2022 1607   VLDL 4 02/10/2022 1607   LDLCALC 59 02/10/2022 1607    Physical Exam:    VS:  BP (!) 158/76 (BP Location: Right Arm, Patient Position: Sitting)   Pulse 80   Ht 5' 9.5" (1.765 m)   Wt 164 lb 9.6 oz (74.7 kg)   SpO2 98%   BMI 23.96 kg/m      Wt Readings from Last 3 Encounters:  04/14/22 164 lb 9.6 oz (74.7 kg)  03/27/22 164 lb 11.2 oz (74.7 kg)  02/19/22 164 lb (74.4 kg)  GEN:  Well nourished, well developed in no acute distress HEENT: Normal NECK: No JVD; No carotid bruits LYMPHATICS: No lymphadenopathy CARDIAC: RRR, no murmurs, rubs, gallops RESPIRATORY:  Clear to auscultation without rales, wheezing or rhonchi  ABDOMEN: Soft, non-tender, non-distended MUSCULOSKELETAL:  No edema; No deformity  SKIN: Warm and dry NEUROLOGIC:  Alert and oriented x 3 PSYCHIATRIC:  Normal affect    Signed, Shirlee More, MD  04/14/2022 12:06 PM    [place]

## 2022-04-15 ENCOUNTER — Other Ambulatory Visit: Payer: Self-pay | Admitting: Hematology and Oncology

## 2022-04-15 DIAGNOSIS — Z23 Encounter for immunization: Secondary | ICD-10-CM | POA: Diagnosis not present

## 2022-04-15 DIAGNOSIS — D649 Anemia, unspecified: Secondary | ICD-10-CM

## 2022-04-15 LAB — BASIC METABOLIC PANEL WITH GFR
BUN/Creatinine Ratio: 15 (ref 10–24)
BUN: 34 mg/dL — ABNORMAL HIGH (ref 8–27)
CO2: 22 mmol/L (ref 20–29)
Calcium: 9.8 mg/dL (ref 8.6–10.2)
Chloride: 104 mmol/L (ref 96–106)
Creatinine, Ser: 2.24 mg/dL — ABNORMAL HIGH (ref 0.76–1.27)
Glucose: 100 mg/dL — ABNORMAL HIGH (ref 70–99)
Potassium: 5.3 mmol/L — ABNORMAL HIGH (ref 3.5–5.2)
Sodium: 141 mmol/L (ref 134–144)
eGFR: 28 mL/min/1.73 — ABNORMAL LOW

## 2022-04-15 LAB — CBC
Hematocrit: 31.9 % — ABNORMAL LOW (ref 37.5–51.0)
Hemoglobin: 10.6 g/dL — ABNORMAL LOW (ref 13.0–17.7)
MCH: 29.8 pg (ref 26.6–33.0)
MCHC: 33.2 g/dL (ref 31.5–35.7)
MCV: 90 fL (ref 79–97)
Platelets: 312 10*3/uL (ref 150–450)
RBC: 3.56 x10E6/uL — ABNORMAL LOW (ref 4.14–5.80)
RDW: 12.9 % (ref 11.6–15.4)
WBC: 9.4 10*3/uL (ref 3.4–10.8)

## 2022-04-15 NOTE — Progress Notes (Unsigned)
[facility]  [address] [place],  [place]  [address] [phone]  Clinic Day:  04/16/2022  Referring physician: Emmaline Kluver, *   HISTORY OF PRESENT ILLNESS:  The patient is a 86 y.o. male with  anemia most likely due to chronic kidney disease who I began seeing a few weeks ago.  His hemoglobin was 9.8 at the time of consultation.  Further evaluation was pending.  I discussed the possible use of epoetin injections with the patient and his wife and gave them written information to review.  Due to his significant hypertension, the patient was transferred to the emergency room for treatment.  He was admitted and transfused 1 unit of packed red blood cells when his hemoglobin dropped to 8 the following day.  I planned to see him back in 1 week to review the results of testing, but unfortunately this did not get scheduled.  Dr. Bettina Gavia contacted me earlier this week to see the patient for follow-up.  Evaluation did not reveal any nutritional deficiency contributing to his anemia.  His LDH was mildly elevated, but bilirubin was normal, so hemolysis less likely.  Serum protein electrophoresis did not reveal a monoclonal spike.  PHYSICAL EXAM:  Blood pressure (!) 165/70, pulse 90, temperature 98 F (36.7 C), temperature source Oral, resp. rate 18, height 5' 9.5" (1.765 m), weight 163 lb 9.6 oz (74.2 kg), SpO2 96 %. Wt Readings from Last 3 Encounters:  04/16/22 163 lb 9.6 oz (74.2 kg)  04/14/22 164 lb 9.6 oz (74.7 kg)  03/27/22 164 lb 11.2 oz (74.7 kg)   Body mass index is 23.81 kg/m.  Performance status (ECOG): 1 - Symptomatic but completely ambulatory  Physical Exam Vitals and nursing note reviewed.  Constitutional:      General: He is not in acute distress.    Appearance: Normal appearance. He is normal weight.  HENT:     Head: Normocephalic and atraumatic.     Mouth/Throat:     Mouth: Mucous membranes are moist.     Pharynx: Oropharynx is  clear. No oropharyngeal exudate or posterior oropharyngeal erythema.  Eyes:     General: No scleral icterus.    Extraocular Movements: Extraocular movements intact.     Conjunctiva/sclera: Conjunctivae normal.     Pupils: Pupils are equal, round, and reactive to light.  Cardiovascular:     Rate and Rhythm: Normal rate and regular rhythm.     Heart sounds: Normal heart sounds. No murmur heard.    No friction rub. No gallop.  Pulmonary:     Effort: Pulmonary effort is normal.     Breath sounds: Normal breath sounds. No wheezing, rhonchi or rales.  Abdominal:     General: Bowel sounds are normal. There is no distension.     Palpations: Abdomen is soft. There is no hepatomegaly, splenomegaly or mass.     Tenderness: There is no abdominal tenderness.  Musculoskeletal:        General: Normal range of motion.     Cervical back: Normal range of motion and neck supple. No tenderness.     Right lower leg: No edema.     Left lower leg: No edema.  Lymphadenopathy:     Cervical: No cervical adenopathy.     Upper Body:     Right upper body: No supraclavicular or axillary adenopathy.     Left upper body: No supraclavicular or axillary adenopathy.     Lower Body: No right inguinal adenopathy. No  left inguinal adenopathy.  Skin:    General: Skin is warm and dry.     Coloration: Skin is not jaundiced.     Findings: No rash.  Neurological:     Mental Status: He is alert and oriented to person, place, and time.     Cranial Nerves: No cranial nerve deficit.  Psychiatric:        Mood and Affect: Mood normal.        Behavior: Behavior normal.        Thought Content: Thought content normal.     LABS:      Latest Ref Rng & Units 04/16/2022   12:00 AM 04/14/2022   11:56 AM 03/27/2022   12:00 AM  CBC  WBC  9.0     9.4  7.6      Hemoglobin 13.5 - 17.5 10.2     10.6  9.2      Hematocrit 41 - 53 30     31.9  28      Platelets 150 - 400 K/uL 285     312  250         This result is from an  external source.      Latest Ref Rng & Units 04/16/2022   12:00 AM 04/14/2022   11:56 AM 03/27/2022   12:00 AM  CMP  Glucose 70 - 99 mg/dL  100    BUN 4 - 21 38     34  41      Creatinine 0.6 - 1.3 2.2     2.24  2.0      Sodium 137 - 147 137     141  138      Potassium 3.5 - 5.1 mEq/L 4.4     5.3  4.6      Chloride 99 - 108 105     104  107      CO2 13 - '22 23     22  24      '$ Calcium 8.7 - 10.7 9.5     9.8  9.1      Alkaline Phos 25 - 125 87      101      AST 14 - 40 47      38      ALT 10 - 40 U/L 28      27         This result is from an external source.     No results found for: "CEA1", "CEA" / No results found for: "CEA1", "CEA" No results found for: "PSA1" No results found for: "CAN199" No results found for: "CAN125"  Lab Results  Component Value Date   TOTALPROTELP 6.5 03/27/2022   ALBUMINELP 3.4 03/27/2022   A1GS 0.2 03/27/2022   A2GS 0.6 03/27/2022   BETS 0.8 03/27/2022   GAMS 1.4 03/27/2022   MSPIKE Not Observed 03/27/2022   SPEI Comment 03/27/2022   Lab Results  Component Value Date   TIBC 256 04/16/2022   TIBC 272 03/27/2022   TIBC 276 02/11/2022   FERRITIN 125 04/16/2022   FERRITIN 103 03/27/2022   FERRITIN 120 02/11/2022   IRONPCTSAT 24 04/16/2022   IRONPCTSAT 19 03/27/2022   IRONPCTSAT 27 02/11/2022   Lab Results  Component Value Date   LDH 201 (H) 03/27/2022       Component Value Date/Time   TOTALPROTELP 6.5 03/27/2022 1453   ALBUMINELP 3.4 03/27/2022 1453   A1GS 0.2 03/27/2022 1453   A2GS  0.6 03/27/2022 1453   BETS 0.8 03/27/2022 1453   GAMS 1.4 03/27/2022 1453   MSPIKE Not Observed 03/27/2022 1453   SPEI Comment 03/27/2022 1453   LDH 201 (H) 03/27/2022 1453    Review Flowsheet  More data exists      Latest Ref Rng & Units 02/11/2022 03/27/2022 04/16/2022  Oncology Labs  Ferritin 24 - 336 ng/mL 120  103  125   %SAT 17.9 - 39.5 % '27  19  24   '$ Total Protein ELP 6.0 - 8.5 g/dL - 6.5  -  Albumin ELP 2.9 - 4.4 g/dL - 3.4  -  Alpha-1  Globulin 0.0 - 0.4 g/dL - 0.2  -  Alpha-2 Globulin 0.4 - 1.0 g/dL - 0.6  -  Beta Globulin 0.7 - 1.3 g/dL - 0.8  -  Gamma Globulin 0.4 - 1.8 g/dL - 1.4  -  M-Spike, % Not Observed g/dL - Not Observed  -  SPE Interp. - - Comment  -  LDH 98 - 192 U/L - 201  -     STUDIES:  No results found.    ASSESSMENT & PLAN:   Assessment/Plan:  86 y.o. male with anemia felt to be due to chronic kidney disease.  His hemoglobin has improved and he is no longer symptomatic.  He does not have evidence of iron deficiency at this time.  I once again discussed epoetin injections with the patient and his wife.  Due to the potential risk of blood clot, heart attack and stroke, the patient does not desire epoetin injections at this time.  I advised him since he is on Plavix, the risk of the clotting complications is less.  I advised him that getting blood transfusions has an increased risk of blood clot.  I will plan to see him back in 2 weeks for repeat clinical assessment, so we can get an idea of how his hemoglobin is trending.  The patient understand and his wife all the plans discussed today and are in agreement with them.  He knows to contact our office if he develops symptoms of worsening anemia or other concerns prior to his next appointment    Marvia Pickles, PA-C

## 2022-04-16 ENCOUNTER — Inpatient Hospital Stay: Payer: Medicare Other | Attending: Hematology and Oncology | Admitting: Hematology and Oncology

## 2022-04-16 ENCOUNTER — Inpatient Hospital Stay: Payer: Medicare Other

## 2022-04-16 ENCOUNTER — Encounter: Payer: Self-pay | Admitting: Hematology and Oncology

## 2022-04-16 VITALS — BP 165/70 | HR 90 | Temp 98.0°F | Resp 18 | Ht 69.5 in | Wt 163.6 lb

## 2022-04-16 DIAGNOSIS — Z7902 Long term (current) use of antithrombotics/antiplatelets: Secondary | ICD-10-CM | POA: Diagnosis not present

## 2022-04-16 DIAGNOSIS — N189 Chronic kidney disease, unspecified: Secondary | ICD-10-CM | POA: Diagnosis not present

## 2022-04-16 DIAGNOSIS — D649 Anemia, unspecified: Secondary | ICD-10-CM | POA: Diagnosis not present

## 2022-04-16 DIAGNOSIS — N183 Chronic kidney disease, stage 3 unspecified: Secondary | ICD-10-CM

## 2022-04-16 DIAGNOSIS — I10 Essential (primary) hypertension: Secondary | ICD-10-CM | POA: Diagnosis not present

## 2022-04-16 DIAGNOSIS — D631 Anemia in chronic kidney disease: Secondary | ICD-10-CM | POA: Insufficient documentation

## 2022-04-16 LAB — BASIC METABOLIC PANEL
BUN: 38 — AB (ref 4–21)
CO2: 23 — AB (ref 13–22)
Chloride: 105 (ref 99–108)
Creatinine: 2.2 — AB (ref 0.6–1.3)
Glucose: 193
Potassium: 4.4 mEq/L (ref 3.5–5.1)
Sodium: 137 (ref 137–147)

## 2022-04-16 LAB — CBC AND DIFFERENTIAL
HCT: 30 — AB (ref 41–53)
Hemoglobin: 10.2 — AB (ref 13.5–17.5)
Neutrophils Absolute: 6.21
Platelets: 285 10*3/uL (ref 150–400)
WBC: 9

## 2022-04-16 LAB — FERRITIN: Ferritin: 125 ng/mL (ref 24–336)

## 2022-04-16 LAB — IRON AND TIBC
Iron: 61 ug/dL (ref 45–182)
Saturation Ratios: 24 % (ref 17.9–39.5)
TIBC: 256 ug/dL (ref 250–450)
UIBC: 195 ug/dL

## 2022-04-16 LAB — HEPATIC FUNCTION PANEL
ALT: 28 U/L (ref 10–40)
AST: 47 — AB (ref 14–40)
Alkaline Phosphatase: 87 (ref 25–125)
Bilirubin, Total: 0.6

## 2022-04-16 LAB — TSH: TSH: 2.376 u[IU]/mL (ref 0.350–4.500)

## 2022-04-16 LAB — CBC: RBC: 3.37 — AB (ref 3.87–5.11)

## 2022-04-16 LAB — COMPREHENSIVE METABOLIC PANEL
Albumin: 3.8 (ref 3.5–5.0)
Calcium: 9.5 (ref 8.7–10.7)

## 2022-04-17 LAB — HAPTOGLOBIN: Haptoglobin: 81 mg/dL (ref 38–329)

## 2022-04-18 LAB — PROTEIN ELECTRO, RANDOM URINE
Albumin ELP, Urine: 75 %
Alpha-1-Globulin, U: 4.9 %
Alpha-2-Globulin, U: 3.4 %
Beta Globulin, U: 7.8 %
Gamma Globulin, U: 8.9 %
Total Protein, Urine: 97.1 mg/dL

## 2022-04-21 ENCOUNTER — Other Ambulatory Visit: Payer: Self-pay

## 2022-04-21 DIAGNOSIS — I129 Hypertensive chronic kidney disease with stage 1 through stage 4 chronic kidney disease, or unspecified chronic kidney disease: Secondary | ICD-10-CM

## 2022-04-22 ENCOUNTER — Other Ambulatory Visit: Payer: Self-pay | Admitting: Hematology and Oncology

## 2022-04-22 ENCOUNTER — Inpatient Hospital Stay: Payer: Medicare Other

## 2022-04-22 DIAGNOSIS — D649 Anemia, unspecified: Secondary | ICD-10-CM | POA: Diagnosis not present

## 2022-04-22 DIAGNOSIS — Z23 Encounter for immunization: Secondary | ICD-10-CM | POA: Diagnosis not present

## 2022-04-22 DIAGNOSIS — C61 Malignant neoplasm of prostate: Secondary | ICD-10-CM | POA: Diagnosis not present

## 2022-04-22 DIAGNOSIS — N183 Chronic kidney disease, stage 3 unspecified: Secondary | ICD-10-CM

## 2022-04-22 LAB — HEMOCCULT GUIAC POC 1CARD (OFFICE)
Card #2 Fecal Occult Blod, POC: NEGATIVE
Card #3 Fecal Occult Blood, POC: NEGATIVE
Fecal Occult Blood, POC: NEGATIVE

## 2022-04-24 ENCOUNTER — Telehealth: Payer: Self-pay | Admitting: Internal Medicine

## 2022-04-24 ENCOUNTER — Telehealth: Payer: Self-pay

## 2022-04-24 DIAGNOSIS — H353231 Exudative age-related macular degeneration, bilateral, with active choroidal neovascularization: Secondary | ICD-10-CM | POA: Diagnosis not present

## 2022-04-24 MED ORDER — HYDRALAZINE HCL 50 MG PO TABS: 50.0000 mg | ORAL_TABLET | Freq: Four times a day (QID) | ORAL | 3 refills | Status: DC

## 2022-04-24 NOTE — Telephone Encounter (Signed)
-----   Message from Marvia Pickles, PA-C sent at 04/23/2022 12:11 PM EDT ----- Please let him know stools were negative for blood, thanks

## 2022-04-24 NOTE — Telephone Encounter (Signed)
Pt c/o medication issue:  1. Name of Medication: hydrALAZINE (APRESOLINE) 50 MG tablet  2. How are you currently taking this medication (dosage and times per day)? Take 50 mg by mouth 4 (four) times daily.  3. Are you having a reaction (difficulty breathing--STAT)? No   4. What is your medication issue? Send new prescription over to [place], [place] - [address]

## 2022-04-24 NOTE — Telephone Encounter (Signed)
Good Afternoon,  This patient is a patient of Dr. Bettina Gavia and he is requesting Hydralazine. Thank you so much.

## 2022-04-24 NOTE — Telephone Encounter (Signed)
Refill of Hydralazine 50 mg sent to [facility].

## 2022-04-24 NOTE — Telephone Encounter (Signed)
Notified. 

## 2022-04-26 DIAGNOSIS — N1832 Chronic kidney disease, stage 3b: Secondary | ICD-10-CM | POA: Diagnosis not present

## 2022-04-26 DIAGNOSIS — E782 Mixed hyperlipidemia: Secondary | ICD-10-CM | POA: Diagnosis not present

## 2022-04-26 DIAGNOSIS — E1122 Type 2 diabetes mellitus with diabetic chronic kidney disease: Secondary | ICD-10-CM | POA: Diagnosis not present

## 2022-04-30 ENCOUNTER — Other Ambulatory Visit: Payer: Self-pay | Admitting: Pharmacist

## 2022-04-30 ENCOUNTER — Inpatient Hospital Stay: Payer: Medicare Other

## 2022-04-30 ENCOUNTER — Encounter: Payer: Self-pay | Admitting: Hematology and Oncology

## 2022-04-30 ENCOUNTER — Inpatient Hospital Stay: Payer: Medicare Other | Attending: Hematology and Oncology | Admitting: Hematology and Oncology

## 2022-04-30 VITALS — BP 163/70 | HR 87 | Temp 97.9°F | Resp 20 | Ht 69.5 in | Wt 166.4 lb

## 2022-04-30 DIAGNOSIS — D631 Anemia in chronic kidney disease: Secondary | ICD-10-CM | POA: Insufficient documentation

## 2022-04-30 DIAGNOSIS — N183 Chronic kidney disease, stage 3 unspecified: Secondary | ICD-10-CM | POA: Insufficient documentation

## 2022-04-30 DIAGNOSIS — I129 Hypertensive chronic kidney disease with stage 1 through stage 4 chronic kidney disease, or unspecified chronic kidney disease: Secondary | ICD-10-CM | POA: Diagnosis not present

## 2022-04-30 DIAGNOSIS — D649 Anemia, unspecified: Secondary | ICD-10-CM | POA: Diagnosis not present

## 2022-04-30 DIAGNOSIS — E1122 Type 2 diabetes mellitus with diabetic chronic kidney disease: Secondary | ICD-10-CM | POA: Diagnosis not present

## 2022-04-30 DIAGNOSIS — N189 Chronic kidney disease, unspecified: Secondary | ICD-10-CM | POA: Insufficient documentation

## 2022-04-30 DIAGNOSIS — Z794 Long term (current) use of insulin: Secondary | ICD-10-CM | POA: Diagnosis not present

## 2022-04-30 DIAGNOSIS — N1832 Chronic kidney disease, stage 3b: Secondary | ICD-10-CM | POA: Diagnosis not present

## 2022-04-30 HISTORY — DX: Anemia in chronic kidney disease: D63.1

## 2022-04-30 HISTORY — DX: Chronic kidney disease, stage 3 unspecified: N18.30

## 2022-04-30 LAB — HEPATIC FUNCTION PANEL
ALT: 30 U/L (ref 10–40)
AST: 38 (ref 14–40)
Alkaline Phosphatase: 84 (ref 25–125)
Bilirubin, Total: 0.6

## 2022-04-30 LAB — BASIC METABOLIC PANEL
BUN: 40 — AB (ref 4–21)
CO2: 25 — AB (ref 13–22)
Chloride: 104 (ref 99–108)
Creatinine: 2.2 — AB (ref 0.6–1.3)
Glucose: 212
Potassium: 4.3 mEq/L (ref 3.5–5.1)
Sodium: 137 (ref 137–147)

## 2022-04-30 LAB — CBC AND DIFFERENTIAL
HCT: 29 — AB (ref 41–53)
Hemoglobin: 9.8 — AB (ref 13.5–17.5)
MCV: 88 (ref 80–94)
Neutrophils Absolute: 5.49
Platelets: 260 10*3/uL (ref 150–400)
WBC: 8.2

## 2022-04-30 LAB — COMPREHENSIVE METABOLIC PANEL
Albumin: 3.6 (ref 3.5–5.0)
Calcium: 9.4 (ref 8.7–10.7)

## 2022-04-30 LAB — CBC: RBC: 3.22 — AB (ref 3.87–5.11)

## 2022-04-30 NOTE — Progress Notes (Cosign Needed)
[facility]  [address] [place],  [place]  [address] [phone]  Clinic Day:  04/30/2022  Referring physician: Emmaline Kluver, *   HISTORY OF PRESENT ILLNESS:  The patient is a 86 y.o. male with  anemia most likely due to chronic kidney disease who I began seeing in August.  I discussed the possible use of epoetin injections with the patient and his wife.  Initially, he did not wish to proceed with epoetin, but he is open to that today.  He does tire easily.   He denies progressive fatigue concerning for worsening anemia. He states he occasionally has shortness of breath with exertion.  He denies chest pain.  He denies any overt form of blood loss.  Evaluation did not reveal another cause of his anemia.  Stool Hemoccults were negative x 3.  PHYSICAL EXAM:  Blood pressure (!) 163/70, pulse 87, temperature 97.9 F (36.6 C), temperature source Oral, resp. rate 20, height 5' 9.5" (1.765 m), weight 166 lb 6.4 oz (75.5 kg), SpO2 96 %. Wt Readings from Last 3 Encounters:  04/30/22 166 lb 6.4 oz (75.5 kg)  04/16/22 163 lb 9.6 oz (74.2 kg)  04/14/22 164 lb 9.6 oz (74.7 kg)   Body mass index is 24.22 kg/m.  Performance status (ECOG): 1 - Symptomatic but completely ambulatory  Physical Exam Vitals and nursing note reviewed.  Constitutional:      General: He is not in acute distress.    Appearance: Normal appearance. He is normal weight.  HENT:     Head: Normocephalic and atraumatic.     Mouth/Throat:     Mouth: Mucous membranes are moist.     Pharynx: Oropharynx is clear. No oropharyngeal exudate or posterior oropharyngeal erythema.  Eyes:     General: No scleral icterus.    Extraocular Movements: Extraocular movements intact.     Conjunctiva/sclera: Conjunctivae normal.     Pupils: Pupils are equal, round, and reactive to light.  Cardiovascular:     Rate and Rhythm: Normal rate and regular rhythm.     Heart sounds: Normal heart sounds. No  murmur heard.    No friction rub. No gallop.  Pulmonary:     Effort: Pulmonary effort is normal.     Breath sounds: Normal breath sounds. No wheezing, rhonchi or rales.  Abdominal:     General: Bowel sounds are normal. There is no distension.     Palpations: Abdomen is soft. There is no hepatomegaly, splenomegaly or mass.     Tenderness: There is no abdominal tenderness.  Musculoskeletal:        General: Normal range of motion.     Cervical back: Normal range of motion and neck supple. No tenderness.     Right lower leg: No edema.     Left lower leg: No edema.  Lymphadenopathy:     Cervical: No cervical adenopathy.     Upper Body:     Right upper body: No supraclavicular or axillary adenopathy.     Left upper body: No supraclavicular or axillary adenopathy.     Lower Body: No right inguinal adenopathy. No left inguinal adenopathy.  Skin:    General: Skin is warm and dry.     Coloration: Skin is not jaundiced.     Findings: No rash.  Neurological:     Mental Status: He is alert and oriented to person, place, and time.     Cranial Nerves: No cranial nerve deficit.  Psychiatric:  Mood and Affect: Mood normal.        Behavior: Behavior normal.        Thought Content: Thought content normal.     LABS:      Latest Ref Rng & Units 04/30/2022   12:00 AM 04/16/2022   12:00 AM 04/14/2022   11:56 AM  CBC  WBC  8.2     9.0     9.4   Hemoglobin 13.5 - 17.5 9.8     10.2     10.6   Hematocrit 41 - 53 29     30     31.9   Platelets 150 - 400 K/uL 260     285     312      This result is from an external source.      Latest Ref Rng & Units 04/30/2022   12:00 AM 04/16/2022   12:00 AM 04/14/2022   11:56 AM  CMP  Glucose 70 - 99 mg/dL   100   BUN 4 - 21 40     38     34   Creatinine 0.6 - 1.3 2.2     2.2     2.24   Sodium 137 - 147 137     137     141   Potassium 3.5 - 5.1 mEq/L 4.3     4.4     5.3   Chloride 99 - 108 104     105     104   CO2 13 - '22 25     23     22    '$ Calcium 8.7 - 10.7 9.4     9.5     9.8   Alkaline Phos 25 - 125 84     87       AST 14 - 40 38     47       ALT 10 - 40 U/L 30     28          This result is from an external source.     No results found for: "CEA1", "CEA" / No results found for: "CEA1", "CEA" No results found for: "PSA1" No results found for: "CAN199" No results found for: "CAN125"  Lab Results  Component Value Date   TOTALPROTELP 6.5 03/27/2022   ALBUMINELP 3.4 03/27/2022   A1GS 0.2 03/27/2022   A2GS 0.6 03/27/2022   BETS 0.8 03/27/2022   GAMS 1.4 03/27/2022   MSPIKE Not Observed 03/27/2022   SPEI Comment 03/27/2022   Lab Results  Component Value Date   TIBC 256 04/16/2022   TIBC 272 03/27/2022   TIBC 276 02/11/2022   FERRITIN 125 04/16/2022   FERRITIN 103 03/27/2022   FERRITIN 120 02/11/2022   IRONPCTSAT 24 04/16/2022   IRONPCTSAT 19 03/27/2022   IRONPCTSAT 27 02/11/2022   Lab Results  Component Value Date   LDH 201 (H) 03/27/2022       Component Value Date/Time   TOTALPROTELP 6.5 03/27/2022 1453   ALBUMINELP 3.4 03/27/2022 1453   A1GS 0.2 03/27/2022 1453   A2GS 0.6 03/27/2022 1453   BETS 0.8 03/27/2022 1453   GAMS 1.4 03/27/2022 1453   MSPIKE Not Observed 03/27/2022 1453   SPEI Comment 03/27/2022 1453   LDH 201 (H) 03/27/2022 1453    Review Flowsheet  More data exists      Latest Ref Rng & Units 02/11/2022 03/27/2022 04/16/2022  Oncology Labs  Ferritin 24 - 336 ng/mL 120  103  125   %SAT 17.9 - 39.5 % '27  19  24   '$ Total Protein ELP 6.0 - 8.5 g/dL - 6.5  -  Albumin ELP 2.9 - 4.4 g/dL - 3.4  -  Alpha-1 Globulin 0.0 - 0.4 g/dL - 0.2  -  Alpha-2 Globulin 0.4 - 1.0 g/dL - 0.6  -  Beta Globulin 0.7 - 1.3 g/dL - 0.8  -  Gamma Globulin 0.4 - 1.8 g/dL - 1.4  -  M-Spike, % Not Observed g/dL - Not Observed  -  SPE Interp. - - Comment  -  LDH 98 - 192 U/L - 201  -    STUDIES:    ASSESSMENT & PLAN:   Assessment/Plan:  86 y.o. male with anemia felt to be due to chronic kidney disease.   This may also represent an early myelodysplasia.  His hemoglobin has dropped slightly.  He remains asymptomatic.  At this time, he would like to proceed with epoetin injections.  We have previously discussed the potential adverse effects.  I will start with Retacrit 20,000 units monthly.  I will plan to see him back in 4 weeks for repeat clinical assessment.  The patient understand and his wife all the plans discussed today and are in agreement with them.  He knows to contact our office if he develops symptoms of worsening anemia or other concerns prior to his next appointment    Marvia Pickles, PA-C

## 2022-05-01 ENCOUNTER — Inpatient Hospital Stay: Payer: Medicare Other

## 2022-05-01 VITALS — BP 174/82 | HR 97 | Temp 98.0°F | Resp 20

## 2022-05-01 DIAGNOSIS — D631 Anemia in chronic kidney disease: Secondary | ICD-10-CM | POA: Diagnosis not present

## 2022-05-01 DIAGNOSIS — N183 Chronic kidney disease, stage 3 unspecified: Secondary | ICD-10-CM

## 2022-05-01 DIAGNOSIS — N189 Chronic kidney disease, unspecified: Secondary | ICD-10-CM | POA: Diagnosis not present

## 2022-05-01 MED ORDER — EPOETIN ALFA-EPBX 20000 UNIT/ML IJ SOLN
20000.0000 [IU] | Freq: Once | INTRAMUSCULAR | Status: AC
  Administered 2022-05-01: 20000 [IU] via SUBCUTANEOUS
  Filled 2022-05-01: qty 1

## 2022-05-01 NOTE — Patient Instructions (Signed)

## 2022-05-02 DIAGNOSIS — N184 Chronic kidney disease, stage 4 (severe): Secondary | ICD-10-CM | POA: Diagnosis not present

## 2022-05-02 DIAGNOSIS — N1832 Chronic kidney disease, stage 3b: Secondary | ICD-10-CM | POA: Diagnosis not present

## 2022-05-02 DIAGNOSIS — E1122 Type 2 diabetes mellitus with diabetic chronic kidney disease: Secondary | ICD-10-CM | POA: Diagnosis not present

## 2022-05-02 DIAGNOSIS — Z794 Long term (current) use of insulin: Secondary | ICD-10-CM | POA: Diagnosis not present

## 2022-05-02 LAB — PROTEIN ELECTRO, RANDOM URINE
Albumin ELP, Urine: 66.8 %
Alpha-1-Globulin, U: 5.9 %
Alpha-2-Globulin, U: 4.1 %
Beta Globulin, U: 9.6 %
Gamma Globulin, U: 13.5 %
Total Protein, Urine: 97.1 mg/dL

## 2022-05-21 ENCOUNTER — Other Ambulatory Visit: Payer: Self-pay | Admitting: Cardiology

## 2022-05-22 ENCOUNTER — Ambulatory Visit: Payer: Medicare Other | Admitting: Cardiology

## 2022-05-26 ENCOUNTER — Encounter: Payer: Self-pay | Admitting: Cardiology

## 2022-05-26 NOTE — Telephone Encounter (Signed)
error 

## 2022-05-27 DIAGNOSIS — E1122 Type 2 diabetes mellitus with diabetic chronic kidney disease: Secondary | ICD-10-CM | POA: Diagnosis not present

## 2022-05-27 DIAGNOSIS — E782 Mixed hyperlipidemia: Secondary | ICD-10-CM | POA: Diagnosis not present

## 2022-05-27 DIAGNOSIS — N1832 Chronic kidney disease, stage 3b: Secondary | ICD-10-CM | POA: Diagnosis not present

## 2022-05-28 ENCOUNTER — Encounter: Payer: Self-pay | Admitting: Hematology and Oncology

## 2022-05-28 ENCOUNTER — Inpatient Hospital Stay: Payer: Medicare Other | Attending: Hematology and Oncology | Admitting: Hematology and Oncology

## 2022-05-28 ENCOUNTER — Inpatient Hospital Stay: Payer: Medicare Other

## 2022-05-28 ENCOUNTER — Other Ambulatory Visit: Payer: Self-pay | Admitting: Hematology and Oncology

## 2022-05-28 VITALS — BP 177/71 | HR 77 | Temp 97.6°F | Resp 18 | Ht 69.5 in | Wt 164.1 lb

## 2022-05-28 VITALS — BP 193/84 | HR 76 | Temp 97.9°F | Resp 18 | Ht 69.5 in | Wt 162.0 lb

## 2022-05-28 DIAGNOSIS — D631 Anemia in chronic kidney disease: Secondary | ICD-10-CM

## 2022-05-28 DIAGNOSIS — N189 Chronic kidney disease, unspecified: Secondary | ICD-10-CM | POA: Diagnosis not present

## 2022-05-28 DIAGNOSIS — D649 Anemia, unspecified: Secondary | ICD-10-CM | POA: Diagnosis not present

## 2022-05-28 DIAGNOSIS — D638 Anemia in other chronic diseases classified elsewhere: Secondary | ICD-10-CM | POA: Diagnosis not present

## 2022-05-28 DIAGNOSIS — N183 Chronic kidney disease, stage 3 unspecified: Secondary | ICD-10-CM | POA: Insufficient documentation

## 2022-05-28 LAB — CBC AND DIFFERENTIAL
HCT: 31 — AB (ref 41–53)
Hemoglobin: 10.8 — AB (ref 13.5–17.5)
MCV: 90 (ref 80–94)
Neutrophils Absolute: 5.46
Platelets: 249 10*3/uL (ref 150–400)
WBC: 7.8

## 2022-05-28 LAB — CBC: RBC: 3.43 — AB (ref 3.87–5.11)

## 2022-05-28 MED ORDER — EPOETIN ALFA-EPBX 20000 UNIT/ML IJ SOLN
20000.0000 [IU] | Freq: Once | INTRAMUSCULAR | Status: AC
  Administered 2022-05-28: 20000 [IU] via SUBCUTANEOUS

## 2022-05-28 NOTE — Progress Notes (Unsigned)
[facility]  [address] [place],  [place]  [address] [phone]  Clinic Day:  05/28/2022  Referring physician: Emmaline Kluver, *   HISTORY OF PRESENT ILLNESS:  The patient is a 86 y.o. male with  anemia most likely due to chronic kidney disease.  He started epoetin injections 4 weeks ago and has tolerated them without difficulty.  He states he is feeling very well at this time.  He denies fatigue.  He states he has occasional palpitations.  He denies shortness of breath or chest pain.  He states his blood pressure at home usually runs about 145/90.  His dose of isosorbide has been reduced to 20 mg daily.  PHYSICAL EXAM:  Blood pressure (!) 193/84, pulse 76, temperature 97.9 F (36.6 C), temperature source Oral, resp. rate 18, height 5' 9.5" (1.765 m), weight 162 lb (73.5 kg), SpO2 98 %. Wt Readings from Last 3 Encounters:  05/28/22 164 lb 1.9 oz (74.4 kg)  05/28/22 162 lb (73.5 kg)  04/30/22 166 lb 6.4 oz (75.5 kg)   Body mass index is 23.58 kg/m.  Performance status (ECOG): 0 - Asymptomatic  Physical Exam Vitals and nursing note reviewed.  Constitutional:      General: He is not in acute distress.    Appearance: Normal appearance. He is normal weight.  HENT:     Head: Normocephalic and atraumatic.     Mouth/Throat:     Mouth: Mucous membranes are moist.     Pharynx: Oropharynx is clear. No oropharyngeal exudate or posterior oropharyngeal erythema.  Eyes:     General: No scleral icterus.    Extraocular Movements: Extraocular movements intact.     Conjunctiva/sclera: Conjunctivae normal.     Pupils: Pupils are equal, round, and reactive to light.  Cardiovascular:     Rate and Rhythm: Normal rate and regular rhythm.     Heart sounds: Normal heart sounds. No murmur heard.    No friction rub. No gallop.  Pulmonary:     Effort: Pulmonary effort is normal.     Breath sounds: Normal breath sounds. No wheezing, rhonchi or rales.   Abdominal:     General: Bowel sounds are normal. There is no distension.     Palpations: Abdomen is soft. There is no hepatomegaly, splenomegaly or mass.     Tenderness: There is no abdominal tenderness.  Musculoskeletal:        General: Normal range of motion.     Cervical back: Normal range of motion and neck supple. No tenderness.     Right lower leg: No edema.     Left lower leg: No edema.  Lymphadenopathy:     Cervical: No cervical adenopathy.     Upper Body:     Right upper body: No supraclavicular or axillary adenopathy.     Left upper body: No supraclavicular or axillary adenopathy.     Lower Body: No right inguinal adenopathy. No left inguinal adenopathy.  Skin:    General: Skin is warm and dry.     Coloration: Skin is not jaundiced.     Findings: No rash.  Neurological:     Mental Status: He is alert and oriented to person, place, and time.     Cranial Nerves: No cranial nerve deficit.  Psychiatric:        Mood and Affect: Mood normal.        Behavior: Behavior normal.        Thought Content: Thought content normal.  LABS:      Latest Ref Rng & Units 05/28/2022   12:00 AM 04/30/2022   12:00 AM 04/16/2022   12:00 AM  CBC  WBC  7.8     8.2     9.0      Hemoglobin 13.5 - 17.5 10.8     9.8     10.2      Hematocrit 41 - 53 '31     29     30      '$ Platelets 150 - 400 K/uL 249     260     285         This result is from an external source.      Latest Ref Rng & Units 04/30/2022   12:00 AM 04/16/2022   12:00 AM 04/14/2022   11:56 AM  CMP  Glucose 70 - 99 mg/dL   100   BUN 4 - 21 40     38     34   Creatinine 0.6 - 1.3 2.2     2.2     2.24   Sodium 137 - 147 137     137     141   Potassium 3.5 - 5.1 mEq/L 4.3     4.4     5.3   Chloride 99 - 108 104     105     104   CO2 13 - '22 25     23     22   '$ Calcium 8.7 - 10.7 9.4     9.5     9.8   Alkaline Phos 25 - 125 84     87       AST 14 - 40 38     47       ALT 10 - 40 U/L 30     28          This result is from  an external source.     No results found for: "CEA1", "CEA" / No results found for: "CEA1", "CEA" No results found for: "PSA1" No results found for: "CAN199" No results found for: "CAN125"  Lab Results  Component Value Date   TOTALPROTELP 6.5 03/27/2022   ALBUMINELP 3.4 03/27/2022   A1GS 0.2 03/27/2022   A2GS 0.6 03/27/2022   BETS 0.8 03/27/2022   GAMS 1.4 03/27/2022   MSPIKE Not Observed 03/27/2022   SPEI Comment 03/27/2022   Lab Results  Component Value Date   TIBC 256 04/16/2022   TIBC 272 03/27/2022   TIBC 276 02/11/2022   FERRITIN 125 04/16/2022   FERRITIN 103 03/27/2022   FERRITIN 120 02/11/2022   IRONPCTSAT 24 04/16/2022   IRONPCTSAT 19 03/27/2022   IRONPCTSAT 27 02/11/2022   Lab Results  Component Value Date   LDH 201 (H) 03/27/2022       Component Value Date/Time   TOTALPROTELP 6.5 03/27/2022 1453   ALBUMINELP 3.4 03/27/2022 1453   A1GS 0.2 03/27/2022 1453   A2GS 0.6 03/27/2022 1453   BETS 0.8 03/27/2022 1453   GAMS 1.4 03/27/2022 1453   MSPIKE Not Observed 03/27/2022 1453   SPEI Comment 03/27/2022 1453   LDH 201 (H) 03/27/2022 1453    Review Flowsheet  More data exists      Latest Ref Rng & Units 02/11/2022 03/27/2022 04/16/2022  Oncology Labs  Ferritin 24 - 336 ng/mL 120  103  125   %SAT 17.9 - 39.5 % '27  19  24   '$ Total  Protein ELP 6.0 - 8.5 g/dL - 6.5  -  Albumin ELP 2.9 - 4.4 g/dL - 3.4  -  Alpha-1 Globulin 0.0 - 0.4 g/dL - 0.2  -  Alpha-2 Globulin 0.4 - 1.0 g/dL - 0.6  -  Beta Globulin 0.7 - 1.3 g/dL - 0.8  -  Gamma Globulin 0.4 - 1.8 g/dL - 1.4  -  M-Spike, % Not Observed g/dL - Not Observed  -  SPE Interp. - - Comment  -  LDH 98 - 192 U/L - 201  -   STUDIES:    ASSESSMENT & PLAN:   Assessment/Plan:  86 y.o. male with anemia felt to be due to chronic kidney disease.  This may also represent an early myelodysplasia.  He has had an excellent response to epoetin.  I will have him continue 20,000 units every 4 weeks.  I will plan to see  him back in 12 weeks for repeat clinical assessment.  The patient understand and his wife all the plans discussed today and are in agreement with them.  He knows to contact our office if he develops symptoms of worsening anemia or other concerns prior to his next appointment    Marvia Pickles, PA-C

## 2022-05-28 NOTE — Patient Instructions (Signed)

## 2022-05-29 ENCOUNTER — Encounter: Payer: Self-pay | Admitting: Hematology and Oncology

## 2022-05-30 ENCOUNTER — Encounter: Payer: Self-pay | Admitting: Hematology and Oncology

## 2022-06-12 ENCOUNTER — Encounter: Payer: Self-pay | Admitting: Podiatry

## 2022-06-12 ENCOUNTER — Ambulatory Visit (INDEPENDENT_AMBULATORY_CARE_PROVIDER_SITE_OTHER): Payer: Medicare Other | Admitting: Podiatry

## 2022-06-12 DIAGNOSIS — M79675 Pain in left toe(s): Secondary | ICD-10-CM

## 2022-06-12 DIAGNOSIS — E1142 Type 2 diabetes mellitus with diabetic polyneuropathy: Secondary | ICD-10-CM

## 2022-06-12 DIAGNOSIS — M79674 Pain in right toe(s): Secondary | ICD-10-CM

## 2022-06-12 DIAGNOSIS — B351 Tinea unguium: Secondary | ICD-10-CM | POA: Diagnosis not present

## 2022-06-12 DIAGNOSIS — H353231 Exudative age-related macular degeneration, bilateral, with active choroidal neovascularization: Secondary | ICD-10-CM | POA: Diagnosis not present

## 2022-06-13 ENCOUNTER — Encounter: Payer: Self-pay | Admitting: Cardiology

## 2022-06-13 ENCOUNTER — Ambulatory Visit: Payer: Medicare Other | Attending: Cardiology | Admitting: Cardiology

## 2022-06-13 VITALS — BP 160/70 | HR 64 | Ht 69.5 in | Wt 165.0 lb

## 2022-06-13 DIAGNOSIS — D631 Anemia in chronic kidney disease: Secondary | ICD-10-CM | POA: Diagnosis not present

## 2022-06-13 DIAGNOSIS — N1832 Chronic kidney disease, stage 3b: Secondary | ICD-10-CM | POA: Diagnosis not present

## 2022-06-13 DIAGNOSIS — N184 Chronic kidney disease, stage 4 (severe): Secondary | ICD-10-CM | POA: Diagnosis not present

## 2022-06-13 DIAGNOSIS — I110 Hypertensive heart disease with heart failure: Secondary | ICD-10-CM | POA: Insufficient documentation

## 2022-06-13 DIAGNOSIS — E7800 Pure hypercholesterolemia, unspecified: Secondary | ICD-10-CM | POA: Insufficient documentation

## 2022-06-13 DIAGNOSIS — I25119 Atherosclerotic heart disease of native coronary artery with unspecified angina pectoris: Secondary | ICD-10-CM | POA: Diagnosis not present

## 2022-06-13 DIAGNOSIS — I350 Nonrheumatic aortic (valve) stenosis: Secondary | ICD-10-CM | POA: Diagnosis not present

## 2022-06-13 NOTE — Patient Instructions (Signed)
Medication Instructions:  Your physician recommends that you continue on your current medications as directed. Please refer to the Current Medication list given to you today.  *If you need a refill on your cardiac medications before your next appointment, please call your pharmacy*   Lab Work: NONE If you have labs (blood work) drawn today and your tests are completely normal, you will receive your results only by: [facility] (if you have MyChart) OR A paper copy in the mail If you have any lab test that is abnormal or we need to change your treatment, we will call you to review the results.   Testing/Procedures: NONE   Follow-Up: At [facility], you and your health needs are our priority.  As part of our continuing mission to provide you with exceptional heart care, we have created designated Provider Care Teams.  These Care Teams include your primary Cardiologist (physician) and Advanced Practice Providers (APPs -  Physician Assistants and Nurse Practitioners) who all work together to provide you with the care you need, when you need it.  We recommend signing up for the patient portal called "MyChart".  Sign up information is provided on this After Visit Summary.  MyChart is used to connect with patients for Virtual Visits (Telemedicine).  Patients are able to view lab/test results, encounter notes, upcoming appointments, etc.  Non-urgent messages can be sent to your provider as well.   To learn more about what you can do with MyChart, go to [email or web address].    Your next appointment:   4 month(s)  The format for your next appointment:   In Person  Provider:   Shirlee More, MD    Other Instructions   Important Information About Sugar

## 2022-06-13 NOTE — Progress Notes (Signed)
Cardiology Office Note:    Date:  06/13/2022   ID:  Troy [name], DOB 1936/06/08, MRN [id number]  PCP:  Street, [name], MD  Cardiologist:  Shirlee More, MD    Referring MD: [address], [name], *    ASSESSMENT:    1. Hypertensive heart disease with heart failure ([place])   2. CKD (chronic kidney disease) stage 4, GFR 15-29 ml/min (HCC)   3. Coronary artery disease involving native coronary artery of native heart with angina pectoris ([place])   4. Nonrheumatic aortic valve stenosis   5. Anemia due to stage 3b chronic kidney disease ([place])   6. Pure hypercholesterolemia    PLAN:    In order of problems listed above:  Akshath has had a remarkable improvement of hypertension heart failure with correction of his severe anemia from CKD.  He has no fluid overload currently does not require loop diuretic loop diuretic and fortunately has stable CKD and continues treatment including oral nitrate hydralazine and amlodipine. Stable mild aortic stenosis Anemia is markedly improved with iron transfusion now erythropoietin managed by hematology Able CAD continue his medical therapy including clopidogrel lipid-lowering with a high intensity statin   Next appointment: 4 months   Medication Adjustments/Labs and Tests Ordered: Current medicines are reviewed at length with the patient today.  Concerns regarding medicines are outlined above.  No orders of the defined types were placed in this encounter.  No orders of the defined types were placed in this encounter.   Chief Complaint  Patient presents with   Follow-up   Congestive Heart Failure    History of Present Illness:    Troy [name] is a 86 y.o. male with a hx of hypertensive heart disease with heart failure and stage IV CKD coronary artery disease with previous PCI and recent PCI and stent for ACS 02/12/2022 mild aortic stenosis previous symptomatic bradycardia resolved stopping a beta-blocker and symptomatic anemia  associated with his CKD requiring transfusion.  He was last seen 04/14/2022.  Most recent hemoglobin 11.1 2000 2310.8 and has received erythropoietin to treat his anemia associated with chronic kidney disease and most recent creatinine stable 2.2 potassium 4.3 same date  Compliance with diet, lifestyle and medications: Yes   Since improvement of anemia he is markedly improved he is out of the house doing activities traveling is not having angina edema shortness of breath chest pain palpitation or syncope His blood pressure consistently runs 1 65-9 50 systolic over 93-57 his wife is an Therapist, sports checks frequently and that is our goal with his previous resistant hypertension He is very pleased with the quality of his life   Past Medical History:  Diagnosis Date   Anemia 12/09/2015   Atherosclerosis    CAD in native artery 12/09/2015   Overview:  PCI/ stent 2001   Chronic diastolic heart failure ([place]) 12/09/2015   Chronic pancreatitis ([place])    CKD (chronic kidney disease)    3B   Coronary artery disease involving native coronary artery of native heart with angina pectoris ([place]) 12/09/2015   Overview:  PCI/ stent 2001   Diabetes ([place])    GERD (gastroesophageal reflux disease)    Hearing loss    Hyperlipidemia 12/09/2015   Hypertensive heart disease with heart failure ([place]) 12/09/2015   Macular degeneration    legally blind   MI (myocardial infarction) ([place])    Necrotizing pancreatitis 06/26/2015   Prostate cancer ([place])    radiation treatments here   Pseudocyst of pancreas 06/26/2015  PVD (peripheral vascular disease) ([place])    Second degree heart block     Past Surgical History:  Procedure Laterality Date   ABDOMINAL AORTOGRAM W/LOWER EXTREMITY Bilateral 09/10/2018   Procedure: ABDOMINAL AORTOGRAM W/LOWER EXTREMITY;  Surgeon: Elam Dutch, MD;  Location: [facility];  Service: Cardiovascular;  Laterality: Bilateral;   CHOLECYSTECTOMY     CORONARY STENT INTERVENTION N/A  02/14/2022   Procedure: CORONARY STENT INTERVENTION;  Surgeon: Nelva Bush, MD;  Location: [facility];  Service: Cardiovascular;  Laterality: N/A;   INTRAVASCULAR ULTRASOUND/IVUS N/A 02/14/2022   Procedure: Intravascular Ultrasound/IVUS;  Surgeon: Nelva Bush, MD;  Location: [facility];  Service: Cardiovascular;  Laterality: N/A;   LEFT HEART CATH N/A 02/14/2022   Procedure: Left Heart Cath;  Surgeon: Nelva Bush, MD;  Location: [facility];  Service: Cardiovascular;  Laterality: N/A;   LEFT HEART CATH AND CORONARY ANGIOGRAPHY N/A 02/12/2022   Procedure: LEFT HEART CATH AND CORONARY ANGIOGRAPHY;  Surgeon: Wellington Hampshire, MD;  Location: [facility];  Service: Cardiovascular;  Laterality: N/A;   PERIPHERAL VASCULAR INTERVENTION Right 09/10/2018   Procedure: PERIPHERAL VASCULAR INTERVENTION;  Surgeon: Elam Dutch, MD;  Location: [facility];  Service: Cardiovascular;  Laterality: Right;  common iliac   TONSILLECTOMY AND ADENOIDECTOMY     TOTAL KNEE REVISION Bilateral     Current Medications: Current Meds  Medication Sig   acetaminophen (TYLENOL) 500 MG tablet Take 500-1,000 mg by mouth every 6 (six) hours as needed for moderate pain.   ALPRAZolam (XANAX) 0.25 MG tablet 0.5 - 1 tablet 1-3 times daily prn   For anxiety   amLODipine (NORVASC) 5 MG tablet Take 1 tablet (5 mg total) by mouth in the morning and at bedtime.   atorvastatin (LIPITOR) 80 MG tablet TAKE 1 TABLET EACH EVENING   B Complex Vitamins (VITAMIN B COMPLEX) TABS Take 1 tablet by mouth daily.   Cholecalciferol (VITAMIN D3) 50 MCG (2000 UT) TABS Take 4,000 Units by mouth daily.   Cinnamon 500 MG capsule Take 500 mg by mouth daily.   clopidogrel (PLAVIX) 75 MG tablet Take 75 mg by mouth daily.   co-enzyme Q-10 30 MG capsule Take 30 mg by mouth daily.   doxazosin (CARDURA) 2 MG tablet Take 2 mg by mouth at bedtime.    hydrALAZINE (APRESOLINE) 50 MG tablet Take 1 tablet (50 mg  total) by mouth 4 (four) times daily.   insulin glargine (LANTUS) 100 UNIT/ML injection Inject 20 Units into the skin daily.   Iron Combinations (IRON COMPLEX PO) Take 65 mg by mouth daily.   isosorbide dinitrate (ISORDIL) 20 MG tablet Take 40 mg by mouth 3 (three) times daily.   Multiple Vitamin (MULTIVITAMIN) tablet Take 1 tablet by mouth daily.   Multiple Vitamins-Minerals (EYE VITAMINS PO) Take 1 tablet by mouth daily.   nitroGLYCERIN (NITROSTAT) 0.4 MG SL tablet Place 1 tablet (0.4 mg total) under the tongue every 5 (five) minutes as needed for chest pain.   omeprazole (PRILOSEC) 40 MG capsule Take 40 mg by mouth daily.   Pancrelipase, Lip-Prot-Amyl, (CREON) 24000-76000 units CPEP Take 1 capsule by mouth in the morning, at noon, and at bedtime.   Propylene Glycol (SYSTANE BALANCE) 0.6 % SOLN Place 1 drop into both eyes at bedtime.   vitamin C (ASCORBIC ACID) 500 MG tablet Take 500 mg by mouth daily.     Allergies:   Patient has no known allergies.   Social History  Socioeconomic History   Marital status: Married    Spouse name: Lelon Frohlich   Number of children: 3   Years of education: 16   Highest education level: Associate degree: academic program  Occupational History   Occupation: Retired   Occupation: retired Theme park manager  Tobacco Use   Smoking status: Never    Passive exposure: Past   Smokeless tobacco: Never  Vaping Use   Vaping Use: Never used  Substance and Sexual Activity   Alcohol use: No   Drug use: No   Sexual activity: Not on file  Other Topics Concern   Not on file  Social History Narrative   Not on file   Social Determinants of Health   Financial Resource Strain: Low Risk  (02/14/2022)   Overall Financial Resource Strain (CARDIA)    Difficulty of Paying Living Expenses: Not hard at all  Food Insecurity: No Food Insecurity (02/14/2022)   Hunger Vital Sign    Worried About Running Out of Food in the Last Year: Never true    [place] in the Last Year: Never  true  Transportation Needs: No Transportation Needs (02/14/2022)   PRAPARE - Hydrologist (Medical): No    Lack of Transportation (Non-Medical): No  Physical Activity: Not on file  Stress: Not on file  Social Connections: Not on file     Family History: The patient's family history includes Diabetes in his mother; Heart failure in his father and mother. ROS:   Please see the history of present illness.    All other systems reviewed and are negative.  EKGs/Labs/Other Studies Reviewed:    The following studies were reviewed today:   Recent Labs: 02/10/2022: B Natriuretic Peptide 845.7 02/15/2022: Magnesium 1.8 04/16/2022: TSH 2.376 04/30/2022: ALT 30; BUN 40; Creatinine 2.2; Potassium 4.3; Sodium 137 05/28/2022: Hemoglobin 10.8; Platelets 249  Recent Lipid Panel    Component Value Date/Time   CHOL 102 02/10/2022 1607   TRIG 21 02/10/2022 1607   HDL 39 (L) 02/10/2022 1607   CHOLHDL 2.6 02/10/2022 1607   VLDL 4 02/10/2022 1607   LDLCALC 59 02/10/2022 1607    Physical Exam:    VS:  BP (!) 160/70 (BP Location: Right Arm, Patient Position: Sitting, Cuff Size: Normal)   Pulse 64   Ht 5' 9.5" (1.765 m)   Wt 165 lb (74.8 kg)   SpO2 99%   BMI 24.02 kg/m     Wt Readings from Last 3 Encounters:  06/13/22 165 lb (74.8 kg)  05/28/22 164 lb 1.9 oz (74.4 kg)  05/28/22 162 lb (73.5 kg)     GEN:  Well nourished, well developed in no acute distress HEENT: Normal NECK: No JVD; No carotid bruits LYMPHATICS: No lymphadenopathy CARDIAC: He has a grade 1/6 systolic ejection murmur aortic stenosis RRR, no rubs, gallops RESPIRATORY:  Clear to auscultation without rales, wheezing or rhonchi  ABDOMEN: Soft, non-tender, non-distended MUSCULOSKELETAL:  No edema; No deformity  SKIN: Warm and dry NEUROLOGIC:  Alert and oriented x 3 PSYCHIATRIC:  Normal affect    Signed, Shirlee More, MD  06/13/2022 3:34 PM    [facility]

## 2022-06-17 ENCOUNTER — Telehealth: Payer: Self-pay | Admitting: Cardiology

## 2022-06-17 NOTE — Telephone Encounter (Signed)
Patient is currently not taking his farxiga, it is on hold following placement of a stent, per the patient.  Loma Sousa from [facility] is calling to see if the patient can start taking the medication again? Please advise

## 2022-06-17 NOTE — Telephone Encounter (Signed)
Pt c/o medication issue:  1. Name of Medication:   dapagliflozin propanediol (FARXIGA) 10 MG TABS tablet    2. How are you currently taking this medication (dosage and times per day)?  Take by mouth daily. Currently on hold after stent placed per patientPatient not taking: Reported on 04/16/2022  3. Are you having a reaction (difficulty breathing--STAT)? No  4. What is your medication issue? Calling to inquire about whether or not pt is able to start back taking medication. Please advise

## 2022-06-18 NOTE — Telephone Encounter (Signed)
Left message for Troy [name] to call back.

## 2022-06-18 NOTE — Progress Notes (Signed)
  Subjective:  Patient ID: Troy [name], male    DOB: May 01, 1936,  MRN: [id number]  Troy [name] presents to clinic today for at risk foot care with history of diabetic neuropathy  Chief Complaint  Patient presents with   Nail Problem    Diabetic foot care BS-180 A1C-6.9 PCP-Street PCP VST-1 month ago   Patient relates painful thick toenails that are difficult to trim. Pain interferes with ambulation. Aggravating factors include wearing enclosed shoe gear. Pain is relieved with periodic professional debridement.  PCP is Street, [name], MD.  No Known Allergies  Review of Systems: Negative except as noted in the HPI.  Objective: No changes noted in today's physical examination.  Troy [name] is a pleasant 86 y.o. male WD, WN in NAD. AAO x 3.  Vascular Examination: CFT <3 seconds b/l LE. Faintly palpable DP pulses b/l LE. Faintly palpable PT pulse(s) b/l LE. Pedal hair absent. No pain with calf compression b/l. Lower extremity skin temperature gradient within normal limits. No edema noted b/l LE. Varicosities present b/l. No cyanosis or clubbing noted b/l LE.  Dermatological Examination: Pedal skin thin, shiny and atrophic b/l LE. No open wounds b/l LE. No interdigital macerations noted b/l LE. Toenails 1-5 b/l elongated, discolored, dystrophic, thickened, crumbly with subungual debris and tenderness to dorsal palpation. No hyperkeratotic nor porokeratotic lesions present on today's visit.  Musculoskeletal Examination: Muscle strength 5/5 to all lower extremity muscle groups bilaterally. HAV with bunion deformity noted b/l LE.  Neurological Examination: Protective sensation decreased with 10 gram monofilament b/l. Vibratory sensation intact b/l.  Assessment/Plan: 1. Pain due to onychomycosis of toenails of both feet   2. Diabetic peripheral neuropathy associated with type 2 diabetes mellitus ([place])     No orders of the defined types were placed in this encounter.    -Patient was evaluated and treated. All patient's and/or POA's questions/concerns answered on today's visit. -Continue foot and shoe inspections daily. Monitor blood glucose per PCP/Endocrinologist's recommendations. -Continue supportive shoe gear daily. -Toenails 1-5 b/l were debrided in length and girth with sterile nail nippers and dremel without iatrogenic bleeding.  -Patient/POA to call should there be question/concern in the interim.   Return in about 3 months (around 09/12/2022).  Marzetta Board, DPM

## 2022-06-23 DIAGNOSIS — H9202 Otalgia, left ear: Secondary | ICD-10-CM | POA: Diagnosis not present

## 2022-06-23 NOTE — Telephone Encounter (Signed)
Left message for Troy [name] to call back

## 2022-06-24 NOTE — Telephone Encounter (Signed)
Left message for Troy [name] to call back

## 2022-06-25 ENCOUNTER — Inpatient Hospital Stay: Payer: Medicare Other

## 2022-06-25 ENCOUNTER — Other Ambulatory Visit: Payer: Self-pay | Admitting: Hematology and Oncology

## 2022-06-25 DIAGNOSIS — D631 Anemia in chronic kidney disease: Secondary | ICD-10-CM | POA: Diagnosis not present

## 2022-06-25 DIAGNOSIS — N183 Chronic kidney disease, stage 3 unspecified: Secondary | ICD-10-CM | POA: Diagnosis not present

## 2022-06-25 DIAGNOSIS — D649 Anemia, unspecified: Secondary | ICD-10-CM | POA: Diagnosis not present

## 2022-06-25 LAB — CBC AND DIFFERENTIAL
HCT: 34 — AB (ref 41–53)
Hemoglobin: 11.1 — AB (ref 13.5–17.5)
MCV: 91 (ref 80–94)
Neutrophils Absolute: 6.46
Platelets: 253 10*3/uL (ref 150–400)
WBC: 9.1

## 2022-06-25 LAB — CBC: RBC: 3.71 — AB (ref 3.87–5.11)

## 2022-06-25 NOTE — Telephone Encounter (Signed)
Called the patient and his wife said that she had told Loma Sousa at [facility] that Dr. Bettina Gavia did not want the patient taking Farxiga at this time. The patient's wife had no further questions at this time.

## 2022-06-25 NOTE — Telephone Encounter (Signed)
Called  [facility] and explained that Dr. Bettina Gavia did not want to start the patient on Farxiga because of his CKD. Loma Sousa stated that Wilder Glade was used to treat CKD. I said that Dr. Bettina Gavia was out of the office until Monday, but when he returned I could ask him again about restarting the patients [place] and she stated that, that would be fine. Will follow up with Dr. Bettina Gavia on Monday.

## 2022-06-25 NOTE — Telephone Encounter (Signed)
Courtney returned RN's call.

## 2022-06-26 DIAGNOSIS — E1122 Type 2 diabetes mellitus with diabetic chronic kidney disease: Secondary | ICD-10-CM | POA: Diagnosis not present

## 2022-06-26 DIAGNOSIS — N1832 Chronic kidney disease, stage 3b: Secondary | ICD-10-CM | POA: Diagnosis not present

## 2022-06-26 DIAGNOSIS — E782 Mixed hyperlipidemia: Secondary | ICD-10-CM | POA: Diagnosis not present

## 2022-06-27 DIAGNOSIS — M26609 Unspecified temporomandibular joint disorder, unspecified side: Secondary | ICD-10-CM | POA: Diagnosis not present

## 2022-06-27 DIAGNOSIS — Z6824 Body mass index (BMI) 24.0-24.9, adult: Secondary | ICD-10-CM | POA: Diagnosis not present

## 2022-06-27 DIAGNOSIS — H6120 Impacted cerumen, unspecified ear: Secondary | ICD-10-CM | POA: Diagnosis not present

## 2022-07-22 ENCOUNTER — Encounter: Payer: Self-pay | Admitting: Hematology and Oncology

## 2022-07-23 ENCOUNTER — Inpatient Hospital Stay: Payer: Medicare Other | Attending: Hematology and Oncology

## 2022-07-23 ENCOUNTER — Inpatient Hospital Stay: Payer: Medicare Other

## 2022-07-23 VITALS — BP 157/57 | HR 88 | Temp 97.6°F | Resp 18 | Ht 69.5 in | Wt 163.1 lb

## 2022-07-23 DIAGNOSIS — N183 Chronic kidney disease, stage 3 unspecified: Secondary | ICD-10-CM | POA: Diagnosis not present

## 2022-07-23 DIAGNOSIS — D631 Anemia in chronic kidney disease: Secondary | ICD-10-CM | POA: Insufficient documentation

## 2022-07-23 DIAGNOSIS — N189 Chronic kidney disease, unspecified: Secondary | ICD-10-CM | POA: Diagnosis present

## 2022-07-23 DIAGNOSIS — D649 Anemia, unspecified: Secondary | ICD-10-CM | POA: Diagnosis not present

## 2022-07-23 LAB — CBC AND DIFFERENTIAL
HCT: 30 — AB (ref 41–53)
Hemoglobin: 9.9 — AB (ref 13.5–17.5)
Neutrophils Absolute: 12.56
Platelets: 226 10*3/uL (ref 150–400)
WBC: 15.5

## 2022-07-23 LAB — CBC: RBC: 3.3 — AB (ref 3.87–5.11)

## 2022-07-23 MED ORDER — EPOETIN ALFA-EPBX 20000 UNIT/ML IJ SOLN
20000.0000 [IU] | Freq: Once | INTRAMUSCULAR | Status: AC
  Administered 2022-07-23: 20000 [IU] via SUBCUTANEOUS
  Filled 2022-07-23: qty 1

## 2022-07-23 NOTE — Patient Instructions (Signed)

## 2022-07-31 DIAGNOSIS — Z794 Long term (current) use of insulin: Secondary | ICD-10-CM | POA: Diagnosis not present

## 2022-07-31 DIAGNOSIS — H43813 Vitreous degeneration, bilateral: Secondary | ICD-10-CM | POA: Diagnosis not present

## 2022-07-31 DIAGNOSIS — H04123 Dry eye syndrome of bilateral lacrimal glands: Secondary | ICD-10-CM | POA: Diagnosis not present

## 2022-07-31 DIAGNOSIS — H353232 Exudative age-related macular degeneration, bilateral, with inactive choroidal neovascularization: Secondary | ICD-10-CM | POA: Diagnosis not present

## 2022-07-31 DIAGNOSIS — D638 Anemia in other chronic diseases classified elsewhere: Secondary | ICD-10-CM | POA: Diagnosis not present

## 2022-07-31 DIAGNOSIS — Z79899 Other long term (current) drug therapy: Secondary | ICD-10-CM | POA: Diagnosis not present

## 2022-07-31 DIAGNOSIS — E785 Hyperlipidemia, unspecified: Secondary | ICD-10-CM | POA: Diagnosis not present

## 2022-07-31 DIAGNOSIS — E1122 Type 2 diabetes mellitus with diabetic chronic kidney disease: Secondary | ICD-10-CM | POA: Diagnosis not present

## 2022-07-31 DIAGNOSIS — E559 Vitamin D deficiency, unspecified: Secondary | ICD-10-CM | POA: Diagnosis not present

## 2022-07-31 DIAGNOSIS — N1832 Chronic kidney disease, stage 3b: Secondary | ICD-10-CM | POA: Diagnosis not present

## 2022-07-31 DIAGNOSIS — E538 Deficiency of other specified B group vitamins: Secondary | ICD-10-CM | POA: Diagnosis not present

## 2022-07-31 DIAGNOSIS — N184 Chronic kidney disease, stage 4 (severe): Secondary | ICD-10-CM | POA: Diagnosis not present

## 2022-08-04 ENCOUNTER — Telehealth: Payer: Self-pay | Admitting: Cardiology

## 2022-08-04 DIAGNOSIS — N1832 Chronic kidney disease, stage 3b: Secondary | ICD-10-CM | POA: Diagnosis not present

## 2022-08-04 DIAGNOSIS — Z794 Long term (current) use of insulin: Secondary | ICD-10-CM | POA: Diagnosis not present

## 2022-08-04 DIAGNOSIS — E1122 Type 2 diabetes mellitus with diabetic chronic kidney disease: Secondary | ICD-10-CM | POA: Diagnosis not present

## 2022-08-04 DIAGNOSIS — I5022 Chronic systolic (congestive) heart failure: Secondary | ICD-10-CM | POA: Diagnosis not present

## 2022-08-04 NOTE — Telephone Encounter (Signed)
Called Dorna Leitz at [facility] regarding this patient. She would like to know if Dr. Bettina Gavia is ok with her starting the patient on Farxiga? Dr. Bettina Gavia is out of the office today. Will follow up with him regarding this tomorrow.

## 2022-08-04 NOTE — Telephone Encounter (Signed)
Caller would like a call back to discuss re-starting the patient's Farxiga.

## 2022-08-12 NOTE — Telephone Encounter (Signed)
Left message for Dorna Leitz to call back

## 2022-08-12 NOTE — Telephone Encounter (Signed)
Troy [name] from [facility] is returning call.

## 2022-08-12 NOTE — Telephone Encounter (Signed)
Left message for the patient to call back.  

## 2022-08-12 NOTE — Telephone Encounter (Signed)
Called [place] and informed her of Dr. Oren Binet recommendation below:  "I would not with the severity of his CKD"  Troy [name] had no further questions at this time.

## 2022-08-14 DIAGNOSIS — H353232 Exudative age-related macular degeneration, bilateral, with inactive choroidal neovascularization: Secondary | ICD-10-CM | POA: Diagnosis not present

## 2022-08-15 ENCOUNTER — Other Ambulatory Visit: Payer: Self-pay | Admitting: Hematology and Oncology

## 2022-08-15 DIAGNOSIS — D631 Anemia in chronic kidney disease: Secondary | ICD-10-CM

## 2022-08-18 ENCOUNTER — Other Ambulatory Visit: Payer: Self-pay | Admitting: Cardiology

## 2022-08-18 NOTE — Progress Notes (Cosign Needed)
[facility]  [address] [place],  [place]  [address] [phone]  Clinic Day:  08/20/2022  Referring physician: Emmaline Kluver, *   HISTORY OF PRESENT ILLNESS:  The patient is a 87 y.o. male with anemia secondary to chronic kidney disease.  He was placed on epoetin 20,000 international units monthly in October with a good response.  Here today for repeat clinical assessment.  He denies progressive fatigue concerning for worsening anemia.  He denies any overt form of blood loss.  Epoetin had to be held in late November, as his hemoglobin was above 11.  His hemoglobin then dropped down to 9.9, so he received epoetin on December 27th.  PHYSICAL EXAM:  Blood pressure (!) 143/54, pulse 84, temperature 97.9 F (36.6 C), temperature source Oral, resp. rate 20, height 5' 9.5" (1.765 m), weight 160 lb 4.8 oz (72.7 kg), SpO2 100 %. Wt Readings from Last 3 Encounters:  08/20/22 160 lb 4.8 oz (72.7 kg)  07/23/22 163 lb 1.3 oz (74 kg)  06/13/22 165 lb (74.8 kg)   Body mass index is 23.33 kg/m.  Performance status (ECOG): 0 - Asymptomatic  Physical Exam Vitals and nursing note reviewed.  Constitutional:      General: He is not in acute distress.    Appearance: Normal appearance. He is normal weight.  HENT:     Head: Normocephalic and atraumatic.     Mouth/Throat:     Mouth: Mucous membranes are moist.     Pharynx: Oropharynx is clear. No oropharyngeal exudate or posterior oropharyngeal erythema.  Eyes:     General: No scleral icterus.    Extraocular Movements: Extraocular movements intact.     Conjunctiva/sclera: Conjunctivae normal.     Pupils: Pupils are equal, round, and reactive to light.  Cardiovascular:     Rate and Rhythm: Normal rate and regular rhythm.     Heart sounds: Murmur heard.     Systolic murmur is present with a grade of 3/6.     No friction rub. No gallop.  Pulmonary:     Effort: Pulmonary effort is normal.     Breath  sounds: Normal breath sounds. No wheezing, rhonchi or rales.  Abdominal:     General: Bowel sounds are normal. There is no distension.     Palpations: Abdomen is soft. There is no hepatomegaly, splenomegaly or mass.     Tenderness: There is no abdominal tenderness.  Musculoskeletal:        General: Normal range of motion.     Cervical back: Normal range of motion and neck supple. No tenderness.     Right lower leg: No edema.     Left lower leg: No edema.  Lymphadenopathy:     Cervical: No cervical adenopathy.     Upper Body:     Right upper body: No supraclavicular or axillary adenopathy.     Left upper body: No supraclavicular or axillary adenopathy.     Lower Body: No right inguinal adenopathy. No left inguinal adenopathy.  Skin:    General: Skin is warm and dry.     Coloration: Skin is not jaundiced.     Findings: No rash.  Neurological:     Mental Status: He is alert and oriented to person, place, and time.     Cranial Nerves: No cranial nerve deficit.  Psychiatric:        Mood and Affect: Mood normal.        Behavior: Behavior normal.  Thought Content: Thought content normal.    LABS:      Latest Ref Rng & Units 08/20/2022   10:11 AM 07/23/2022   12:00 AM 06/25/2022   12:00 AM  CBC  WBC 4.0 - 10.5 K/uL 8.9  15.5     9.1      Hemoglobin 13.0 - 17.0 g/dL 10.3  9.9     11.1      Hematocrit 39.0 - 52.0 % 32.2  30     34      Platelets 150 - 400 K/uL 261  226     253         This result is from an external source.      Latest Ref Rng & Units 08/20/2022   10:02 AM 04/30/2022   12:00 AM 04/16/2022   12:00 AM  CMP  Glucose 70 - 99 mg/dL 145     BUN 8 - 23 mg/dL 51  40     38      Creatinine 0.61 - 1.24 mg/dL 2.68  2.2     2.2      Sodium 135 - 145 mmol/L 135  137     137      Potassium 3.5 - 5.1 mmol/L 4.5  4.3     4.4      Chloride 98 - 111 mmol/L 103  104     105      CO2 22 - 32 mmol/L '23  25     23      '$ Calcium 8.9 - 10.3 mg/dL 9.1  9.4     9.5      Total  Protein 6.5 - 8.1 g/dL 7.3     Total Bilirubin 0.3 - 1.2 mg/dL 0.4     Alkaline Phos 38 - 126 U/L 64  84     87      AST 15 - 41 U/L 30  38     47      ALT 0 - 44 U/L '21  30     28         '$ This result is from an external source.     No results found for: "CEA1", "CEA" / No results found for: "CEA1", "CEA" No results found for: "PSA1" No results found for: "CAN199" No results found for: "CAN125"  Lab Results  Component Value Date   TOTALPROTELP 6.5 03/27/2022   ALBUMINELP 3.4 03/27/2022   A1GS 0.2 03/27/2022   A2GS 0.6 03/27/2022   BETS 0.8 03/27/2022   GAMS 1.4 03/27/2022   MSPIKE Not Observed 03/27/2022   SPEI Comment 03/27/2022   Lab Results  Component Value Date   TIBC 258 08/20/2022   TIBC 256 04/16/2022   TIBC 272 03/27/2022   FERRITIN 109 08/20/2022   FERRITIN 125 04/16/2022   FERRITIN 103 03/27/2022   IRONPCTSAT 24 08/20/2022   IRONPCTSAT 24 04/16/2022   IRONPCTSAT 19 03/27/2022   Lab Results  Component Value Date   LDH 201 (H) 03/27/2022       Component Value Date/Time   TOTALPROTELP 6.5 03/27/2022 1453   ALBUMINELP 3.4 03/27/2022 1453   A1GS 0.2 03/27/2022 1453   A2GS 0.6 03/27/2022 1453   BETS 0.8 03/27/2022 1453   GAMS 1.4 03/27/2022 1453   MSPIKE Not Observed 03/27/2022 1453   SPEI Comment 03/27/2022 1453   LDH 201 (H) 03/27/2022 1453    Review Flowsheet  More data exists      Latest Ref Rng &  Units 03/27/2022 04/16/2022 08/20/2022  Oncology Labs  Ferritin 24 - 336 ng/mL 103  125  109   %SAT 17.9 - 39.5 % '19  24  24   '$ Total Protein ELP 6.0 - 8.5 g/dL 6.5  - -  Albumin ELP 2.9 - 4.4 g/dL 3.4  - -  Alpha-1 Globulin 0.0 - 0.4 g/dL 0.2  - -  Alpha-2 Globulin 0.4 - 1.0 g/dL 0.6  - -  Beta Globulin 0.7 - 1.3 g/dL 0.8  - -  Gamma Globulin 0.4 - 1.8 g/dL 1.4  - -  M-Spike, % Not Observed g/dL Not Observed  - -  SPE Interp. - Comment  - -  LDH 98 - 192 U/L 201  - -     STUDIES:  No results found.    ASSESSMENT & PLAN:   Assessment/Plan:   87 y.o. male with anemia secondary to chronic kidney disease.  His hemoglobin has improved with resuming epoetin.  Will plan to continue that every 4 weeks.  I will plan to see him back in 12 weeks for repeat clinical assessment.  The patient understands all the plans discussed today and is in agreement with them.  He knows to contact our office if he develops concerns prior to his next appointment.    Marvia Pickles, PA-C

## 2022-08-19 ENCOUNTER — Encounter: Payer: Self-pay | Admitting: Hematology and Oncology

## 2022-08-20 ENCOUNTER — Telehealth: Payer: Self-pay

## 2022-08-20 ENCOUNTER — Inpatient Hospital Stay: Payer: Medicare Other | Attending: Hematology and Oncology

## 2022-08-20 ENCOUNTER — Inpatient Hospital Stay (INDEPENDENT_AMBULATORY_CARE_PROVIDER_SITE_OTHER): Payer: Medicare Other | Admitting: Hematology and Oncology

## 2022-08-20 ENCOUNTER — Encounter: Payer: Self-pay | Admitting: Hematology and Oncology

## 2022-08-20 VITALS — BP 143/54 | HR 84 | Temp 97.9°F | Resp 20 | Ht 69.5 in | Wt 160.3 lb

## 2022-08-20 DIAGNOSIS — D631 Anemia in chronic kidney disease: Secondary | ICD-10-CM | POA: Insufficient documentation

## 2022-08-20 DIAGNOSIS — N183 Chronic kidney disease, stage 3 unspecified: Secondary | ICD-10-CM | POA: Diagnosis not present

## 2022-08-20 LAB — FERRITIN: Ferritin: 109 ng/mL (ref 24–336)

## 2022-08-20 LAB — IRON AND TIBC
Iron: 62 ug/dL (ref 45–182)
Saturation Ratios: 24 % (ref 17.9–39.5)
TIBC: 258 ug/dL (ref 250–450)
UIBC: 196 ug/dL

## 2022-08-20 LAB — CBC WITH DIFFERENTIAL (CANCER CENTER ONLY)
Abs Immature Granulocytes: 0.03 10*3/uL (ref 0.00–0.07)
Basophils Absolute: 0.1 10*3/uL (ref 0.0–0.1)
Basophils Relative: 1 %
Eosinophils Absolute: 0.2 10*3/uL (ref 0.0–0.5)
Eosinophils Relative: 3 %
HCT: 32.2 % — ABNORMAL LOW (ref 39.0–52.0)
Hemoglobin: 10.3 g/dL — ABNORMAL LOW (ref 13.0–17.0)
Immature Granulocytes: 0 %
Lymphocytes Relative: 17 %
Lymphs Abs: 1.5 10*3/uL (ref 0.7–4.0)
MCH: 29.3 pg (ref 26.0–34.0)
MCHC: 32 g/dL (ref 30.0–36.0)
MCV: 91.7 fL (ref 80.0–100.0)
Monocytes Absolute: 0.9 10*3/uL (ref 0.1–1.0)
Monocytes Relative: 10 %
Neutro Abs: 6.2 10*3/uL (ref 1.7–7.7)
Neutrophils Relative %: 69 %
Platelet Count: 261 10*3/uL (ref 150–400)
RBC: 3.51 MIL/uL — ABNORMAL LOW (ref 4.22–5.81)
RDW: 13.7 % (ref 11.5–15.5)
WBC Count: 8.9 10*3/uL (ref 4.0–10.5)
nRBC: 0 % (ref 0.0–0.2)

## 2022-08-20 LAB — CMP (CANCER CENTER ONLY)
ALT: 21 U/L (ref 0–44)
AST: 30 U/L (ref 15–41)
Albumin: 3.7 g/dL (ref 3.5–5.0)
Alkaline Phosphatase: 64 U/L (ref 38–126)
Anion gap: 9 (ref 5–15)
BUN: 51 mg/dL — ABNORMAL HIGH (ref 8–23)
CO2: 23 mmol/L (ref 22–32)
Calcium: 9.1 mg/dL (ref 8.9–10.3)
Chloride: 103 mmol/L (ref 98–111)
Creatinine: 2.68 mg/dL — ABNORMAL HIGH (ref 0.61–1.24)
GFR, Estimated: 23 mL/min — ABNORMAL LOW (ref >60)
Glucose, Bld: 145 mg/dL — ABNORMAL HIGH (ref 70–99)
Potassium: 4.5 mmol/L (ref 3.5–5.1)
Sodium: 135 mmol/L (ref 135–145)
Total Bilirubin: 0.4 mg/dL (ref 0.3–1.2)
Total Protein: 7.3 g/dL (ref 6.5–8.1)

## 2022-08-20 NOTE — Telephone Encounter (Signed)
Spoke with Lelon Frohlich patients wife she voiced understanding.

## 2022-08-20 NOTE — Telephone Encounter (Signed)
-----  Message from Marvia Pickles, PA-C sent at 08/20/2022  3:39 PM EST ----- Please let him know labs are good for injection this week.  Thank you

## 2022-08-21 ENCOUNTER — Inpatient Hospital Stay: Payer: Medicare Other

## 2022-08-21 VITALS — BP 145/58 | HR 79 | Resp 18 | Ht 69.5 in | Wt 162.1 lb

## 2022-08-21 DIAGNOSIS — D631 Anemia in chronic kidney disease: Secondary | ICD-10-CM | POA: Diagnosis not present

## 2022-08-21 DIAGNOSIS — N183 Chronic kidney disease, stage 3 unspecified: Secondary | ICD-10-CM | POA: Diagnosis not present

## 2022-08-21 MED ORDER — EPOETIN ALFA-EPBX 20000 UNIT/ML IJ SOLN
20000.0000 [IU] | Freq: Once | INTRAMUSCULAR | Status: AC
  Administered 2022-08-21: 20000 [IU] via SUBCUTANEOUS
  Filled 2022-08-21: qty 1

## 2022-08-21 NOTE — Patient Instructions (Signed)

## 2022-08-27 DIAGNOSIS — I110 Hypertensive heart disease with heart failure: Secondary | ICD-10-CM | POA: Diagnosis not present

## 2022-08-27 DIAGNOSIS — N184 Chronic kidney disease, stage 4 (severe): Secondary | ICD-10-CM | POA: Diagnosis not present

## 2022-08-27 DIAGNOSIS — E1122 Type 2 diabetes mellitus with diabetic chronic kidney disease: Secondary | ICD-10-CM | POA: Diagnosis not present

## 2022-09-03 DIAGNOSIS — E1165 Type 2 diabetes mellitus with hyperglycemia: Secondary | ICD-10-CM | POA: Diagnosis not present

## 2022-09-17 ENCOUNTER — Ambulatory Visit: Payer: Medicare Other

## 2022-09-17 ENCOUNTER — Inpatient Hospital Stay: Payer: Medicare Other

## 2022-09-17 ENCOUNTER — Inpatient Hospital Stay: Payer: Medicare Other | Attending: Hematology and Oncology

## 2022-09-17 VITALS — BP 147/58 | HR 82 | Temp 98.2°F | Resp 18 | Ht 69.5 in | Wt 162.0 lb

## 2022-09-17 DIAGNOSIS — N184 Chronic kidney disease, stage 4 (severe): Secondary | ICD-10-CM | POA: Insufficient documentation

## 2022-09-17 DIAGNOSIS — N183 Chronic kidney disease, stage 3 unspecified: Secondary | ICD-10-CM | POA: Diagnosis not present

## 2022-09-17 DIAGNOSIS — D631 Anemia in chronic kidney disease: Secondary | ICD-10-CM | POA: Diagnosis not present

## 2022-09-17 LAB — CBC AND DIFFERENTIAL
HCT: 31 — AB (ref 41–53)
Hemoglobin: 10.3 — AB (ref 13.5–17.5)
Neutrophils Absolute: 7.92
Platelets: 250 10*3/uL (ref 150–400)
WBC: 10.7

## 2022-09-17 LAB — CBC: RBC: 3.43 — AB (ref 3.87–5.11)

## 2022-09-17 MED ORDER — EPOETIN ALFA-EPBX 20000 UNIT/ML IJ SOLN
20000.0000 [IU] | Freq: Once | INTRAMUSCULAR | Status: AC
  Administered 2022-09-17: 20000 [IU] via SUBCUTANEOUS
  Filled 2022-09-17: qty 1

## 2022-09-17 NOTE — Patient Instructions (Signed)

## 2022-10-09 ENCOUNTER — Ambulatory Visit (INDEPENDENT_AMBULATORY_CARE_PROVIDER_SITE_OTHER): Payer: Medicare Other | Admitting: Podiatry

## 2022-10-09 ENCOUNTER — Encounter: Payer: Self-pay | Admitting: Podiatry

## 2022-10-09 VITALS — BP 139/83 | HR 77

## 2022-10-09 DIAGNOSIS — M2012 Hallux valgus (acquired), left foot: Secondary | ICD-10-CM

## 2022-10-09 DIAGNOSIS — E1142 Type 2 diabetes mellitus with diabetic polyneuropathy: Secondary | ICD-10-CM | POA: Diagnosis not present

## 2022-10-09 DIAGNOSIS — E119 Type 2 diabetes mellitus without complications: Secondary | ICD-10-CM

## 2022-10-09 DIAGNOSIS — M79674 Pain in right toe(s): Secondary | ICD-10-CM

## 2022-10-09 DIAGNOSIS — B351 Tinea unguium: Secondary | ICD-10-CM

## 2022-10-09 DIAGNOSIS — M2011 Hallux valgus (acquired), right foot: Secondary | ICD-10-CM

## 2022-10-09 DIAGNOSIS — M79675 Pain in left toe(s): Secondary | ICD-10-CM | POA: Diagnosis not present

## 2022-10-12 NOTE — Progress Notes (Signed)
ANNUAL DIABETIC FOOT EXAM  Subjective: Troy [name] presents today for annual diabetic foot examination. His wife is present during today's visit.  Chief Complaint  Patient presents with   Diabetes    Diabetic foot care, A1c- 6.7 BG- 148, nail trim, PCP last seen a month ago   Patient confirms h/o diabetes.  Patient relates 64 year h/o diabetes.  Patient denies any h/o foot wounds.  Patient has been diagnosed with neuropathy.  Risk factors: diabetes, PAD, h/o MI, HTN, CAD, CKD, hyperlipidemia.  Street, [name], MD is patient's PCP.  Past Medical History:  Diagnosis Date   Anemia 12/09/2015   Atherosclerosis    CAD in native artery 12/09/2015   Overview:  PCI/ stent 2001   Chronic diastolic heart failure ([place]) 12/09/2015   Chronic pancreatitis ([place])    CKD (chronic kidney disease)    3B   Coronary artery disease involving native coronary artery of native heart with angina pectoris ([place]) 12/09/2015   Overview:  PCI/ stent 2001   Diabetes ([place])    GERD (gastroesophageal reflux disease)    Hearing loss    Hyperlipidemia 12/09/2015   Hypertensive heart disease with heart failure ([place]) 12/09/2015   Macular degeneration    legally blind   MI (myocardial infarction) ([place])    Necrotizing pancreatitis 06/26/2015   Prostate cancer ([employer])    radiation treatments here   Pseudocyst of pancreas 06/26/2015   PVD (peripheral vascular disease) ([place])    Second degree heart block    Patient Active Problem List   Diagnosis Date Noted   Anemia due to stage 3 chronic kidney disease ([place]) 04/30/2022   Second degree heart block 04/14/2022   PVD (peripheral vascular disease) ([place]) 04/14/2022   Prostate cancer ([place]) 04/14/2022   Macular degeneration 04/14/2022   GERD (gastroesophageal reflux disease) 04/14/2022   NSTEMI (non-ST elevated myocardial infarction) ([place]) 02/10/2022   LVH (left ventricular hypertrophy) 02/10/2022   Aortic stenosis, mild 01/15/2021   H/O prostate  cancer 01/15/2021   Benign hypertension with chronic kidney disease, stage III ([place]) 01/15/2021   Type 2 diabetes mellitus with stage 4 chronic kidney disease, with long-term current use of insulin ([place]) 01/15/2021   Benign hypertension with chronic kidney disease, stage IV ([place]) 01/15/2021   CKD (chronic kidney disease) stage 4, GFR 15-29 ml/min ([place]) 01/15/2021   Lung nodule 11/05/2020   Bradycardia 10/01/2020   Anemia 12/09/2015   Coronary artery disease involving native coronary artery of native heart with unstable angina pectoris ([place]) 12/09/2015   HFrEF (heart failure with reduced ejection fraction) (HCC) => combination hypertensive heart disease and CAD related 12/09/2015   Hyperlipidemia associated with type 2 diabetes mellitus ([place]) 12/09/2015   Hypertensive heart disease with heart failure ([place]) 12/09/2015   CAD in native artery 12/09/2015   Hearing loss 12/09/2015   Chronic pancreatitis ([place]) 12/09/2015   Chronic diastolic heart failure ([place]) 12/09/2015   Necrotizing pancreatitis 06/26/2015   Pseudocyst of pancreas 06/26/2015   Past Surgical History:  Procedure Laterality Date   ABDOMINAL AORTOGRAM W/LOWER EXTREMITY Bilateral 09/10/2018   Procedure: ABDOMINAL AORTOGRAM W/LOWER EXTREMITY;  Surgeon: Elam Dutch, MD;  Location: [facility];  Service: Cardiovascular;  Laterality: Bilateral;   CHOLECYSTECTOMY     CORONARY STENT INTERVENTION N/A 02/14/2022   Procedure: CORONARY STENT INTERVENTION;  Surgeon: Nelva Bush, MD;  Location: [facility];  Service: Cardiovascular;  Laterality: N/A;   INTRAVASCULAR ULTRASOUND/IVUS N/A 02/14/2022   Procedure: Intravascular Ultrasound/IVUS;  Surgeon: Nelva Bush,  MD;  Location: [facility];  Service: Cardiovascular;  Laterality: N/A;   LEFT HEART CATH N/A 02/14/2022   Procedure: Left Heart Cath;  Surgeon: Nelva Bush, MD;  Location: [facility];  Service: Cardiovascular;  Laterality: N/A;   LEFT  HEART CATH AND CORONARY ANGIOGRAPHY N/A 02/12/2022   Procedure: LEFT HEART CATH AND CORONARY ANGIOGRAPHY;  Surgeon: Wellington Hampshire, MD;  Location: [facility];  Service: Cardiovascular;  Laterality: N/A;   PERIPHERAL VASCULAR INTERVENTION Right 09/10/2018   Procedure: PERIPHERAL VASCULAR INTERVENTION;  Surgeon: Elam Dutch, MD;  Location: [facility];  Service: Cardiovascular;  Laterality: Right;  common iliac   TONSILLECTOMY AND ADENOIDECTOMY     TOTAL KNEE REVISION Bilateral    Current Outpatient Medications on File Prior to Visit  Medication Sig Dispense Refill   acetaminophen (TYLENOL) 500 MG tablet Take 500-1,000 mg by mouth every 6 (six) hours as needed for moderate pain.     ALPRAZolam (XANAX) 0.25 MG tablet 0.5 - 1 tablet 1-3 times daily prn   For anxiety     amLODipine (NORVASC) 5 MG tablet Take 1 tablet (5 mg total) by mouth in the morning and at bedtime. 180 tablet 2   atorvastatin (LIPITOR) 80 MG tablet TAKE 1 TABLET EACH EVENING 90 tablet 1   B Complex Vitamins (VITAMIN B COMPLEX) TABS Take 1 tablet by mouth daily.     Cholecalciferol (VITAMIN D3) 50 MCG (2000 UT) TABS Take 4,000 Units by mouth daily.     Cinnamon 500 MG capsule Take 500 mg by mouth daily.     clopidogrel (PLAVIX) 75 MG tablet Take 75 mg by mouth daily.     co-enzyme Q-10 30 MG capsule Take 30 mg by mouth daily.     dapagliflozin propanediol (FARXIGA) 10 MG TABS tablet Take by mouth daily. Currently on hold after stent placed per patient     doxazosin (CARDURA) 2 MG tablet Take 2 mg by mouth at bedtime.      hydrALAZINE (APRESOLINE) 50 MG tablet Take 1 tablet (50 mg total) by mouth 4 (four) times daily. 360 tablet 3   insulin glargine (LANTUS) 100 UNIT/ML injection Inject 20 Units into the skin daily.     Iron Combinations (IRON COMPLEX PO) Take 65 mg by mouth daily.     isosorbide dinitrate (ISORDIL) 20 MG tablet Take 40 mg by mouth 3 (three) times daily.     Multiple Vitamin (MULTIVITAMIN)  tablet Take 1 tablet by mouth daily.     Multiple Vitamins-Minerals (EYE VITAMINS PO) Take 1 tablet by mouth daily.     nitroGLYCERIN (NITROSTAT) 0.4 MG SL tablet Place 1 tablet (0.4 mg total) under the tongue every 5 (five) minutes as needed for chest pain. 25 tablet 10   omeprazole (PRILOSEC) 40 MG capsule Take 40 mg by mouth daily.     Pancrelipase, Lip-Prot-Amyl, (CREON) 24000-76000 units CPEP Take 1 capsule by mouth in the morning, at noon, and at bedtime.     Propylene Glycol (SYSTANE BALANCE) 0.6 % SOLN Place 1 drop into both eyes at bedtime.     vitamin C (ASCORBIC ACID) 500 MG tablet Take 500 mg by mouth daily.     No current facility-administered medications on file prior to visit.    No Known Allergies Social History   Occupational History   Occupation: Retired   Occupation: retired Theme park manager  Tobacco Use   Smoking status: Never    Passive exposure: Past  Smokeless tobacco: Never  Vaping Use   Vaping Use: Never used  Substance and Sexual Activity   Alcohol use: No   Drug use: No   Sexual activity: Not on file   Family History  Problem Relation Age of Onset   Heart failure Mother    Diabetes Mother    Heart failure Father    Immunization History  Administered Date(s) Administered   PFIZER(Purple Top)SARS-COV-2 Vaccination 08/12/2019, 09/02/2019   Respiratory Syncytial Virus Vaccine,Recomb Aduvanted(Arexvy) 07/29/2022     Review of Systems: Negative except as noted in the HPI.   Objective: Vitals:   10/09/22 1059  BP: 139/83  Pulse: Christie is a pleasant 87 y.o. male in NAD. AAO X 3.  Vascular Examination: CFT <3 seconds b/l. DP/PT pulses faintly palpable b/l. Skin temperature gradient warm to warm b/l. No pain with calf compression. No ischemia or gangrene. No cyanosis or clubbing noted b/l. Pedal hair absent. Varicosities present b/l.   Neurological Examination: Protective sensation decreased with 10 gram monofilament b/l. Vibratory sensation  diminished b/l.  Dermatological Examination: Pedal skin thin, shiny and atrophic b/l LE. No open wounds b/l LE. No interdigital macerations noted b/l LE. Toenails 1-5 bilaterally elongated, discolored, dystrophic, thickened, and crumbly with subungual debris and tenderness to dorsal palpation. No hyperkeratotic nor porokeratotic lesions present on today's visit.  Musculoskeletal Examination: Muscle strength 5/5 to b/l LE. HAV with bunion deformity noted b/l LE.  Radiographs: None  Last A1c:      Latest Ref Rng & Units 02/10/2022    4:07 PM  Hemoglobin A1C  Hemoglobin-A1c 4.8 - 5.6 % 6.8    Footwear Assessment: Does the patient wear appropriate shoes? Yes. Does the patient need inserts/orthotics? No.  Lab Results  Component Value Date   HGBA1C 6.8 (H) 02/10/2022   ADA Risk Categorization: High Risk  Patient has one or more of the following: Loss of protective sensation Absent pedal pulses Severe Foot deformity History of foot ulcer  Assessment: 1. Pain due to onychomycosis of toenails of both feet   2. Hallux valgus, acquired, bilateral   3. Diabetic peripheral neuropathy associated with type 2 diabetes mellitus ([place])   4. Encounter for diabetic foot exam ([place])     Plan: -Consent given for treatment as described below: -Examined patient. -Diabetic foot examination performed today. -Mycotic toenails 1-5 bilaterally were debrided in length and girth with sterile nail nippers and dremel without incident. -Patient/POA to call should there be question/concern in the interim. Return in about 3 months (around 01/09/2023).  Marzetta Board, DPM

## 2022-10-15 ENCOUNTER — Inpatient Hospital Stay: Payer: Medicare Other | Attending: Hematology and Oncology

## 2022-10-15 ENCOUNTER — Inpatient Hospital Stay: Payer: Medicare Other

## 2022-10-15 DIAGNOSIS — N183 Chronic kidney disease, stage 3 unspecified: Secondary | ICD-10-CM

## 2022-10-15 DIAGNOSIS — D649 Anemia, unspecified: Secondary | ICD-10-CM | POA: Diagnosis not present

## 2022-10-15 DIAGNOSIS — D631 Anemia in chronic kidney disease: Secondary | ICD-10-CM | POA: Diagnosis not present

## 2022-10-15 LAB — CBC AND DIFFERENTIAL
HCT: 33 — AB (ref 41–53)
Hemoglobin: 10.9 — AB (ref 13.5–17.5)
Neutrophils Absolute: 6.05
Platelets: 259 10*3/uL (ref 150–400)
WBC: 8.9

## 2022-10-15 LAB — CBC: RBC: 3.64 — AB (ref 3.87–5.11)

## 2022-10-16 DIAGNOSIS — H353232 Exudative age-related macular degeneration, bilateral, with inactive choroidal neovascularization: Secondary | ICD-10-CM | POA: Diagnosis not present

## 2022-10-19 NOTE — Progress Notes (Unsigned)
Date:  10/23/2022   ID:  Troy [name], DOB 09-07-1935, MRN [id number]  PCP:  Street, [name], MD  Cardiologist:  Shirlee More, MD    Referring MD: [address], [name], *    ASSESSMENT:    1. Hypertensive heart disease with heart failure ([place])   2. CKD (chronic kidney disease) stage 4, GFR 15-29 ml/min (HCC)   3. Nonrheumatic aortic valve stenosis   4. Anemia due to stage 3b chronic kidney disease ([place])   5. Sinus bradycardia on ECG   6. Mobitz (type) I (Wenckebach's) atrioventricular block   7. Pure hypercholesterolemia    PLAN:    In order of problems listed above:  Continues to do well since his last PCI and stent stable pattern of exertional angina he is pleased with the quality of his life and continue long-term therapy with clopidogrel along with his oral mononitrate amlodipine and lipid-lowering to high intensity statin. Stable CKD and his anemia has improved with iron repletion erythropoietin followed by local hematology Stable aortic stenosis Bradycardia improved off beta-blocker no more recurrent heart block Continue his current lipid-lowering treatment   Next appointment: 6 months   Medication Adjustments/Labs and Tests Ordered: Current medicines are reviewed at length with the patient today.  Concerns regarding medicines are outlined above.  No orders of the defined types were placed in this encounter.  No orders of the defined types were placed in this encounter.   Chief Complaint  Patient presents with   Follow-up   Congestive Heart Failure   Coronary Artery Disease   Hypertension    History of Present Illness:    Troy [name] is a 87 y.o. male with a hx of hypertensive heart disease with heart failure and stage IV CKD CAD with previous PCI and stent per ACS 02/12/2022 mild aortic stenosis symptomatic bradycardia requiring discontinuation of beta-blocker and anemia associated with his CKD requiring transfusion last seen  06/13/2022.  Compliance with diet, lifestyle and medications: Yes  Troy [name] is seen in the office in follow-up. His wife is a retired Marine scientist she switched his amlodipine to the evening and his blood pressures consistently run 130-140/70 at home He is doing much better since resolution of his iron deficiency anemia his last hemoglobin was 10.9 he has occasional angina usually relieved with rest and time to time takes nitroglycerin with relief and is usually exertional He is not having edema shortness of breath palpitation or syncope No recurrent bradycardia off his beta-blocker and his home heart rate is in the 70s and 80s Past Medical History:  Diagnosis Date   Anemia 12/09/2015   Atherosclerosis    CAD in native artery 12/09/2015   Overview:  PCI/ stent 2001   Chronic diastolic heart failure ([place]) 12/09/2015   Chronic pancreatitis ([place])    CKD (chronic kidney disease)    3B   Coronary artery disease involving native coronary artery of native heart with angina pectoris ([place]) 12/09/2015   Overview:  PCI/ stent 2001   Diabetes ([place])    GERD (gastroesophageal reflux disease)    Hearing loss    Hyperlipidemia 12/09/2015   Hypertensive heart disease with heart failure ([place]) 12/09/2015   Macular degeneration    legally blind   MI (myocardial infarction) ([place])    Necrotizing pancreatitis 06/26/2015   Prostate cancer ([place])    radiation treatments here   Pseudocyst of pancreas 06/26/2015   PVD (peripheral vascular disease) ([place])    Second degree heart block  Past Surgical History:  Procedure Laterality Date   ABDOMINAL AORTOGRAM W/LOWER EXTREMITY Bilateral 09/10/2018   Procedure: ABDOMINAL AORTOGRAM W/LOWER EXTREMITY;  Surgeon: Elam Dutch, MD;  Location: [facility];  Service: Cardiovascular;  Laterality: Bilateral;   CHOLECYSTECTOMY     CORONARY STENT INTERVENTION N/A 02/14/2022   Procedure: CORONARY STENT INTERVENTION;  Surgeon: Nelva Bush, MD;  Location:  [facility];  Service: Cardiovascular;  Laterality: N/A;   INTRAVASCULAR ULTRASOUND/IVUS N/A 02/14/2022   Procedure: Intravascular Ultrasound/IVUS;  Surgeon: Nelva Bush, MD;  Location: [facility];  Service: Cardiovascular;  Laterality: N/A;   LEFT HEART CATH N/A 02/14/2022   Procedure: Left Heart Cath;  Surgeon: Nelva Bush, MD;  Location: [facility];  Service: Cardiovascular;  Laterality: N/A;   LEFT HEART CATH AND CORONARY ANGIOGRAPHY N/A 02/12/2022   Procedure: LEFT HEART CATH AND CORONARY ANGIOGRAPHY;  Surgeon: Wellington Hampshire, MD;  Location: [facility];  Service: Cardiovascular;  Laterality: N/A;   PERIPHERAL VASCULAR INTERVENTION Right 09/10/2018   Procedure: PERIPHERAL VASCULAR INTERVENTION;  Surgeon: Elam Dutch, MD;  Location: [facility];  Service: Cardiovascular;  Laterality: Right;  common iliac   TONSILLECTOMY AND ADENOIDECTOMY     TOTAL KNEE REVISION Bilateral     Current Medications: Current Meds  Medication Sig   acetaminophen (TYLENOL) 500 MG tablet Take 500-1,000 mg by mouth every 6 (six) hours as needed for moderate pain.   ALPRAZolam (XANAX) 0.25 MG tablet 0.5 - 1 tablet 1-3 times daily prn   For anxiety   amLODipine (NORVASC) 5 MG tablet Take 1 tablet (5 mg total) by mouth in the morning and at bedtime.   atorvastatin (LIPITOR) 80 MG tablet TAKE 1 TABLET EACH EVENING   B Complex Vitamins (VITAMIN B COMPLEX) TABS Take 1 tablet by mouth daily.   Cholecalciferol (VITAMIN D3) 50 MCG (2000 UT) TABS Take 4,000 Units by mouth daily.   Cinnamon 500 MG capsule Take 500 mg by mouth daily.   clopidogrel (PLAVIX) 75 MG tablet Take 75 mg by mouth daily.   co-enzyme Q-10 30 MG capsule Take 30 mg by mouth daily.   dapagliflozin propanediol (FARXIGA) 10 MG TABS tablet Take by mouth daily. Currently on hold after stent placed per patient   doxazosin (CARDURA) 2 MG tablet Take 2 mg by mouth at bedtime.    hydrALAZINE (APRESOLINE) 50  MG tablet Take 1 tablet (50 mg total) by mouth 4 (four) times daily.   insulin glargine (LANTUS) 100 UNIT/ML injection Inject 20 Units into the skin daily.   Iron Combinations (IRON COMPLEX PO) Take 65 mg by mouth daily.   isosorbide dinitrate (ISORDIL) 20 MG tablet Take 40 mg by mouth 3 (three) times daily.   Multiple Vitamin (MULTIVITAMIN) tablet Take 1 tablet by mouth daily.   Multiple Vitamins-Minerals (EYE VITAMINS PO) Take 1 tablet by mouth daily.   nitroGLYCERIN (NITROSTAT) 0.4 MG SL tablet Place 1 tablet (0.4 mg total) under the tongue every 5 (five) minutes as needed for chest pain.   omeprazole (PRILOSEC) 40 MG capsule Take 40 mg by mouth daily.   Pancrelipase, Lip-Prot-Amyl, (CREON) 24000-76000 units CPEP Take 1 capsule by mouth in the morning, at noon, and at bedtime.   Propylene Glycol (SYSTANE BALANCE) 0.6 % SOLN Place 1 drop into both eyes at bedtime.   vitamin C (ASCORBIC ACID) 500 MG tablet Take 500 mg by mouth daily.     Allergies:   Patient has no known  allergies.   Social History   Socioeconomic History   Marital status: Married    Spouse name: [name]   Number of children: 3   Years of education: 16   Highest education level: Associate degree: academic program  Occupational History   Occupation: Retired   Occupation: retired Theme park manager  Tobacco Use   Smoking status: Never    Passive exposure: Past   Smokeless tobacco: Never  Vaping Use   Vaping Use: Never used  Substance and Sexual Activity   Alcohol use: No   Drug use: No   Sexual activity: Not on file  Other Topics Concern   Not on file  Social History Narrative   Not on file   Social Determinants of Health   Financial Resource Strain: Low Risk  (02/14/2022)   Overall Financial Resource Strain (CARDIA)    Difficulty of Paying Living Expenses: Not hard at all  Food Insecurity: No Food Insecurity (02/14/2022)   Hunger Vital Sign    Worried About Running Out of Food in the Last Year: Never true    [place] in the Last Year: Never true  Transportation Needs: No Transportation Needs (02/14/2022)   PRAPARE - Hydrologist (Medical): No    Lack of Transportation (Non-Medical): No  Physical Activity: Not on file  Stress: Not on file  Social Connections: Not on file     Family History: The patient's family history includes Diabetes in his mother; Heart failure in his father and mother. ROS:   Please see the history of present illness.    All other systems reviewed and are negative.  EKGs/Labs/Other Studies Reviewed:    The following studies were reviewed today:  Cardiac Studies & Procedures   CARDIAC CATHETERIZATION  CARDIAC CATHETERIZATION 02/14/2022  Narrative Conclusions: Severe single-vessel coronary artery disease involving RCA stent and flanking de-novo disease, as detailed in Dr. Tyrell Antonio diagnostic catheterization note from 02/12/2022. Normal left ventricular filling pressure (LVEDP 10 mmHg). Successful IVUS-guided PCI to RCA with placement of Synergy 3.5 x 12 mm (proximal) and 3.5 x 24 (distal) drug-eluting stents that overlap the previously placed stent.  The entire stented segment was postdilated to 4.1 mm with <10% residual stenosis and TIMI-3 flow.  Recommendations: Remove right femoral artery sheath when ACT has fallen below 175 seconds with manual compression. Continue indefinite dual antiplatelet therapy with aspirin and clopidogrel. Aggressive secondary prevention. Gentle post-catheterization hydration with close monitoring of renal function.  Nelva Bush, MD [facility]  Findings Coronary Findings Diagnostic  Dominance: Right  Right Coronary Artery Ost RCA to Prox RCA lesion is 90% stenosed. The lesion was previously treated . Prox RCA to Mid RCA lesion is 50% stenosed. The lesion was previously treated using a stent (unknown type) . Previously placed stent displays restenosis. Mid RCA lesion is 60% stenosed.  Right  Ventricular Branch RV Branch lesion is 90% stenosed.  Right Posterior Descending Artery RPDA lesion is 50% stenosed.  Right Posterior Atrioventricular Artery There is mild disease in the vessel.  Intervention  Ost RCA to Prox RCA lesion Stent (Also treats lesions: Prox RCA to Mid RCA) A drug-eluting stent was successfully placed using a SYNERGY XD 3.50X12. Post-Intervention Lesion Assessment The intervention was successful. Pre-interventional TIMI flow is 3. Post-intervention TIMI flow is 3. No complications occurred at this lesion. There is a 0% residual stenosis post intervention.  Prox RCA to Mid RCA lesion Stent (Also treats lesions: Ost RCA to Prox RCA) See details in  Ost RCA to Prox RCA lesion. Stent (Also treats lesions: Mid RCA) A drug-eluting stent was successfully placed using a SYNERGY XD 3.50X24. Post-Intervention Lesion Assessment The intervention was successful. Pre-interventional TIMI flow is 3. Post-intervention TIMI flow is 3. No complications occurred at this lesion. There is a 0% residual stenosis post intervention.  Mid RCA lesion Stent (Also treats lesions: Prox RCA to Mid RCA) See details in Prox RCA to Mid RCA lesion. Post-Intervention Lesion Assessment The intervention was successful. Pre-interventional TIMI flow is 3. Post-intervention TIMI flow is 3. No complications occurred at this lesion. There is a 0% residual stenosis post intervention.   CARDIAC CATHETERIZATION 02/12/2022  Narrative   Mid Cx lesion is 30% stenosed.   Prox RCA lesion is 90% stenosed.   Prox RCA to Mid RCA lesion is 50% stenosed.   Mid RCA lesion is 60% stenosed.   RPDA lesion is 50% stenosed.  1.  Severe one-vessel coronary artery disease involving the right coronary artery.  Patent right coronary artery stents with moderate diffuse in-stent restenosis.  However, there is a 90% stenosis at the proximal edge of the stent with hazy appearance highly suggestive of plaque rupture.   In addition, there is a 60% distal edge stenosis. 2.  Moderate aortic stenosis with mean gradient of 23 mmHg. 3.  Moderately difficult procedure via the right radial artery due to significant tortuosity of the innominate and right subclavian arteries.  Recommendations: 35 mL of contrast was used for today's diagnostic procedure.  Gentle hydration postprocedure. Recommend staged RCA PCI on Friday.  I suspect that the whole length of the lesion within the previously placed stents will need to be treated with another drug-eluting stent. Consider femoral artery access for better support given difficulty engaging the right coronary artery from the right radial artery.  Findings Coronary Findings Diagnostic  Dominance: Right  Left Main Vessel is angiographically normal.  Left Anterior Descending There is mild diffuse disease throughout the vessel.  Second Diagonal Branch Vessel is large in size.  Left Circumflex The vessel exhibits minimal luminal irregularities. Mid Cx lesion is 30% stenosed. The lesion is mildly calcified.  First Obtuse Marginal Branch There is mild disease in the vessel.  Third Obtuse Marginal Branch Vessel is angiographically normal.  Right Coronary Artery Prox RCA lesion is 90% stenosed. The lesion was previously treated . Prox RCA to Mid RCA lesion is 50% stenosed. The lesion was previously treated using a stent (unknown type) . Previously placed stent displays restenosis. Mid RCA lesion is 60% stenosed.  Right Posterior Descending Artery RPDA lesion is 50% stenosed.  Right Posterior Atrioventricular Artery There is mild disease in the vessel.  Intervention  No interventions have been documented.     ECHOCARDIOGRAM  ECHOCARDIOGRAM COMPLETE 02/10/2022  Narrative ECHOCARDIOGRAM REPORT    Patient Name:   KRAG ADAMOWICZ Date of Exam: 02/10/2022 Medical Rec #:  [id number]      Height:       71.0 in Accession #:    [id number]     Weight:       168.4  lb Date of Birth:  [birth year]      BSA:          1.960 m Patient Age:    8 years       BP:           138/46 mmHg Patient Gender: M              HR:  47 bpm. Exam Location:  Inpatient  Procedure: 2D Echo, Cardiac Doppler, Color Doppler and 3D Echo  STAT ECHO Reported to: Dr. Dia Crawford on 02/10/2022 3:00:00 PM.  Indications:    NSTEMI I21.4  History:        Patient has prior history of Echocardiogram examinations, most recent 10/02/2020. CAD; Risk Factors:Dyslipidemia.  Sonographer:    Bernadene Person RDCS Referring Phys: Geraldo Docker [facility]  IMPRESSIONS   1. Left ventricular ejection fraction, by estimation, is 55 to 60%. The left ventricle has normal function. The left ventricle demonstrates regional wall motion abnormalities (see scoring diagram/findings for description). Left ventricular diastolic parameters are indeterminate. There is mild hypokinesis of the left ventricular, entire inferior wall. There is mild hypokinesis of the left ventricular, basal inferolateral wall. 2. Right ventricular systolic function is normal. The right ventricular size is normal. There is normal pulmonary artery systolic pressure. 3. Left atrial size was moderately dilated. 4. The mitral valve is normal in structure. Mild to moderate mitral valve regurgitation. No evidence of mitral stenosis. 5. The aortic valve is calcified. There is moderate calcification of the aortic valve. There is moderate thickening of the aortic valve. Aortic valve regurgitation is not visualized. Moderate aortic valve stenosis. 6. The inferior vena cava is normal in size with greater than 50% respiratory variability, suggesting right atrial pressure of 3 mmHg.  Comparison(s): Prior images reviewed side by side. Changes from prior study are noted. Overall similar LVEF, but current study with mild hypokinesis of the inferior/inferolateral wall as noted.  Conclusion(s)/Recommendation(s): New mild wall motion abnormalities  with preserved EF. Moderate aortic stenosis. Sinus bradycardia (as low as 37 bpm) noted throughout study.  FINDINGS Left Ventricle: Left ventricular ejection fraction, by estimation, is 55 to 60%. The left ventricle has normal function. The left ventricle demonstrates regional wall motion abnormalities. Mild hypokinesis of the left ventricular, entire inferior wall. Mild hypokinesis of the left ventricular, basal inferolateral wall. The left ventricular internal cavity size was normal in size. There is no left ventricular hypertrophy. Left ventricular diastolic parameters are indeterminate.  Right Ventricle: The right ventricular size is normal. Right vetricular wall thickness was not well visualized. Right ventricular systolic function is normal. There is normal pulmonary artery systolic pressure. The tricuspid regurgitant velocity is 2.75 m/s, and with an assumed right atrial pressure of 3 mmHg, the estimated right ventricular systolic pressure is [id number] mmHg.  Left Atrium: Left atrial size was moderately dilated.  Right Atrium: Right atrial size was normal in size.  Pericardium: There is no evidence of pericardial effusion.  Mitral Valve: The mitral valve is normal in structure. There is moderate thickening of the mitral valve leaflet(s). There is moderate calcification of the mitral valve leaflet(s). Mild to moderate mitral valve regurgitation. No evidence of mitral valve stenosis.  Tricuspid Valve: The tricuspid valve is normal in structure. Tricuspid valve regurgitation is mild . No evidence of tricuspid stenosis.  Aortic Valve: The aortic valve is calcified. There is moderate calcification of the aortic valve. There is moderate thickening of the aortic valve. Aortic valve regurgitation is not visualized. Moderate aortic stenosis is present. Aortic valve mean gradient measures 23.3 mmHg. Aortic valve peak gradient measures 40.6 mmHg. Aortic valve area, by VTI measures 0.99 cm.  Pulmonic  Valve: The pulmonic valve was not well visualized. Pulmonic valve regurgitation is not visualized. No evidence of pulmonic stenosis.  Aorta: The aortic root and ascending aorta are structurally normal, with no evidence of dilitation.  Venous: The inferior vena cava  is normal in size with greater than 50% respiratory variability, suggesting right atrial pressure of 3 mmHg.  IAS/Shunts: The interatrial septum was not well visualized.   LEFT VENTRICLE PLAX 2D LVIDd:         4.80 cm      Diastology LVIDs:         3.40 cm      LV e' medial:    9.81 cm/s LV PW:         1.10 cm      LV E/e' medial:  13.0 LV IVS:        1.10 cm      LV e' lateral:   10.20 cm/s LVOT diam:     2.10 cm      LV E/e' lateral: 12.5 LV SV:         87 LV SV Index:   44 LVOT Area:     3.46 cm  3D Volume EF: LV Volumes (MOD)            3D EF:        50 % LV vol d, MOD A2C: 120.0 ml LV EDV:       178 ml LV vol d, MOD A4C: 121.0 ml LV ESV:       89 ml LV vol s, MOD A2C: 46.0 ml  LV SV:        89 ml LV vol s, MOD A4C: 51.0 ml LV SV MOD A2C:     74.0 ml LV SV MOD A4C:     121.0 ml LV SV MOD BP:      75.4 ml  RIGHT VENTRICLE RV S prime:     13.10 cm/s TAPSE (M-mode): 2.3 cm  LEFT ATRIUM             Index        RIGHT ATRIUM           Index LA diam:        4.80 cm 2.45 cm/m   RA Area:     14.40 cm LA Vol (A2C):   69.4 ml 35.41 ml/m  RA Volume:   32.90 ml  16.78 ml/m LA Vol (A4C):   74.9 ml 38.21 ml/m LA Biplane Vol: 75.4 ml 38.47 ml/m AORTIC VALVE AV Area (Vmax):    1.10 cm AV Area (Vmean):   0.88 cm AV Area (VTI):     0.99 cm AV Vmax:           318.67 cm/s AV Vmean:          228.333 cm/s AV VTI:            0.878 m AV Peak Grad:      40.6 mmHg AV Mean Grad:      23.3 mmHg LVOT Vmax:         101.00 cm/s LVOT Vmean:        58.100 cm/s LVOT VTI:          0.250 m LVOT/AV VTI ratio: 0.28  AORTA Ao Root diam: 3.30 cm Ao Asc diam:  3.50 cm  MITRAL VALVE                  TRICUSPID VALVE MV Area  (PHT): 5.13 cm       TR Peak grad:   30.2 mmHg MV Decel Time: 148 msec       TR Vmax:        275.00 cm/s MR Peak grad:  141.1 mmHg MR Mean grad:    97.0 mmHg    SHUNTS MR Vmax:         594.00 cm/s  Systemic VTI:  0.25 m MR Vmean:        471.0 cm/s   Systemic Diam: 2.10 cm MR PISA:         1.01 cm MR PISA Eff ROA: 7 mm MR PISA Radius:  0.40 cm MV E velocity: 128.00 cm/s MV A velocity: 79.10 cm/s MV E/A ratio:  1.62  Buford Dresser MD Electronically signed by Buford Dresser MD Signature Date/Time: 02/10/2022/3:01:01 PM    Final    MONITORS  LONG TERM MONITOR (3-14 DAYS) 10/06/2020  Narrative Patch Wear Time:  6 days and 21 hours (2022-02-21T09:56:40-0500 to 2022-02-28T07:44:07-498)  Patient had a min HR of 21 bpm, max HR of 111 bpm, and avg HR of 59 bpm. Predominant underlying rhythm was Sinus Rhythm. First Degree AV Block was present. 17 episode(s) of AV Block (3rd) occurred, lasting a total of 2 mins 15 secs. Second Degree AV Block-Mobitz I (Wenckebach) was present. Wenckebach was detected within +/- 45 seconds of symptomatic patient event(s). Isolated SVEs were rare (<1.0%), and no SVE Couplets or SVE Triplets were present. Isolated VEs were rare (<1.0%), VE Couplets were rare (<1.0%), and no VE Triplets were present. Difficulty discerning atrial activity at times making definitive diagnosis difficult to ascertain. MD notification criteria for Complete Heart Block and Symptomatic Bradycardia met - report posted prior to notification per account request (TY).  An event monitor was performed for 7 days to evaluate bradycardia Cardiac rhythm was sinus with average, minimum and maximum heart rates of 59 21, and 111 bpm. There were frequent episodes of Darden Amber and at x2-1 AV block nocturnally with heart rates in the range of 30 bpm. Ventricular ectopy overall was rare with couplets and single PVCs. Supraventricular ectopy was rare with no episodes of atrial  fibrillation or flutter. The triggered and symptomatic events were predominantly sinus rhythm but frequently occurred during second-degree AV block.  Conclusion frequent episodes of Mobitz 1 second-degree AV block particularly nocturnally at x2-1 conduction effective rate 30 bpm.            Recent Labs: 02/10/2022: B Natriuretic Peptide 845.7 02/15/2022: Magnesium 1.8 04/16/2022: TSH 2.376 08/20/2022: ALT 21; BUN 51; Creatinine 2.68; Potassium 4.5; Sodium 135 10/15/2022: Hemoglobin 10.9; Platelets 259  Recent Lipid Panel    Component Value Date/Time   CHOL 102 02/10/2022 1607   TRIG 21 02/10/2022 1607   HDL 39 (L) 02/10/2022 1607   CHOLHDL 2.6 02/10/2022 1607   VLDL 4 02/10/2022 1607   LDLCALC 59 02/10/2022 1607    Physical Exam:    VS:  BP (!) 134/52 (BP Location: Right Arm, Patient Position: Sitting, Cuff Size: Normal)   Pulse 68   Ht 5' 9.5" (1.765 m)   Wt 161 lb (73 kg)   SpO2 96%   BMI 23.43 kg/m     Wt Readings from Last 3 Encounters:  10/23/22 161 lb (73 kg)  09/17/22 162 lb (73.5 kg)  08/21/22 162 lb 1.3 oz (73.5 kg)     GEN:  Well nourished, well developed in no acute distress HEENT: Normal NECK: No JVD; No carotid bruits LYMPHATICS: No lymphadenopathy CARDIAC: RRR, no murmurs, rubs, gallops RESPIRATORY:  Clear to auscultation without rales, wheezing or rhonchi  ABDOMEN: Soft, non-tender, non-distended MUSCULOSKELETAL:  No edema; No deformity  SKIN: Warm and dry NEUROLOGIC:  Alert and oriented x 3  PSYCHIATRIC:  Normal affect    Signed, Shirlee More, MD  10/23/2022 10:13 AM    [place]

## 2022-10-21 DIAGNOSIS — L570 Actinic keratosis: Secondary | ICD-10-CM | POA: Diagnosis not present

## 2022-10-21 DIAGNOSIS — C4442 Squamous cell carcinoma of skin of scalp and neck: Secondary | ICD-10-CM | POA: Diagnosis not present

## 2022-10-23 ENCOUNTER — Ambulatory Visit: Payer: Medicare Other | Attending: Cardiology | Admitting: Cardiology

## 2022-10-23 ENCOUNTER — Encounter: Payer: Self-pay | Admitting: Cardiology

## 2022-10-23 VITALS — BP 134/52 | HR 68 | Ht 69.5 in | Wt 161.0 lb

## 2022-10-23 DIAGNOSIS — N1832 Chronic kidney disease, stage 3b: Secondary | ICD-10-CM | POA: Diagnosis not present

## 2022-10-23 DIAGNOSIS — D631 Anemia in chronic kidney disease: Secondary | ICD-10-CM

## 2022-10-23 DIAGNOSIS — I441 Atrioventricular block, second degree: Secondary | ICD-10-CM | POA: Diagnosis not present

## 2022-10-23 DIAGNOSIS — I350 Nonrheumatic aortic (valve) stenosis: Secondary | ICD-10-CM | POA: Diagnosis not present

## 2022-10-23 DIAGNOSIS — R001 Bradycardia, unspecified: Secondary | ICD-10-CM | POA: Diagnosis not present

## 2022-10-23 DIAGNOSIS — E7800 Pure hypercholesterolemia, unspecified: Secondary | ICD-10-CM | POA: Diagnosis not present

## 2022-10-23 DIAGNOSIS — N184 Chronic kidney disease, stage 4 (severe): Secondary | ICD-10-CM | POA: Diagnosis not present

## 2022-10-23 DIAGNOSIS — I110 Hypertensive heart disease with heart failure: Secondary | ICD-10-CM | POA: Diagnosis not present

## 2022-10-23 NOTE — Patient Instructions (Signed)
Medication Instructions:  Your physician recommends that you continue on your current medications as directed. Please refer to the Current Medication list given to you today.  *If you need a refill on your cardiac medications before your next appointment, please call your pharmacy*   Lab Work: None If you have labs (blood work) drawn today and your tests are completely normal, you will receive your results only by: MyChart Message (if you have MyChart) OR A paper copy in the mail If you have any lab test that is abnormal or we need to change your treatment, we will call you to review the results.   Testing/Procedures: None   Follow-Up: At [facility], you and your health needs are our priority.  As part of our continuing mission to provide you with exceptional heart care, we have created designated Provider Care Teams.  These Care Teams include your primary Cardiologist (physician) and Advanced Practice Providers (APPs -  Physician Assistants and Nurse Practitioners) who all work together to provide you with the care you need, when you need it.  We recommend signing up for the patient portal called "MyChart".  Sign up information is provided on this After Visit Summary.  MyChart is used to connect with patients for Virtual Visits (Telemedicine).  Patients are able to view lab/test results, encounter notes, upcoming appointments, etc.  Non-urgent messages can be sent to your provider as well.   To learn more about what you can do with MyChart, go to [email or web address].    Your next appointment:   6 month(s)  Provider:   Brian Munley, MD    Other Instructions None  

## 2022-10-26 DIAGNOSIS — E1122 Type 2 diabetes mellitus with diabetic chronic kidney disease: Secondary | ICD-10-CM | POA: Diagnosis not present

## 2022-10-26 DIAGNOSIS — N184 Chronic kidney disease, stage 4 (severe): Secondary | ICD-10-CM | POA: Diagnosis not present

## 2022-10-26 DIAGNOSIS — I110 Hypertensive heart disease with heart failure: Secondary | ICD-10-CM | POA: Diagnosis not present

## 2022-11-11 NOTE — Progress Notes (Deleted)
[facility]  [address] [place],  [place]  [address] [phone]  Clinic Day:  11/11/2022  Referring physician: Bobbye Morton, *   HISTORY OF PRESENT ILLNESS:  The patient is a 87 y.o. male with anemia secondary to chronic kidney disease.  He was placed on epoetin 20,000 international units monthly in October with a good response.  Here today for repeat clinical assessment.  He denies progressive fatigue concerning for worsening anemia.  He denies any overt form of blood loss.    PHYSICAL EXAM:  There were no vitals taken for this visit. Wt Readings from Last 3 Encounters:  10/23/22 161 lb (73 kg)  09/17/22 162 lb (73.5 kg)  08/21/22 162 lb 1.3 oz (73.5 kg)   There is no height or weight on file to calculate BMI.  Performance status (ECOG): {CHL ONC [id number]  Physical Exam Vitals and nursing note reviewed.  Constitutional:      General: He is not in acute distress.    Appearance: Normal appearance. He is normal weight.  HENT:     Head: Normocephalic and atraumatic.     Mouth/Throat:     Mouth: Mucous membranes are moist.     Pharynx: Oropharynx is clear. No oropharyngeal exudate or posterior oropharyngeal erythema.  Eyes:     General: No scleral icterus.    Extraocular Movements: Extraocular movements intact.     Conjunctiva/sclera: Conjunctivae normal.     Pupils: Pupils are equal, round, and reactive to light.  Cardiovascular:     Rate and Rhythm: Normal rate and regular rhythm.     Heart sounds: Murmur heard.     Systolic murmur is present with a grade of 3/6.     No friction rub. No gallop.  Pulmonary:     Effort: Pulmonary effort is normal.     Breath sounds: Normal breath sounds. No wheezing, rhonchi or rales.  Abdominal:     General: Bowel sounds are normal. There is no distension.     Palpations: Abdomen is soft. There is no mass.     Tenderness: There is no abdominal tenderness.  Musculoskeletal:         General: Normal range of motion.     Cervical back: Normal range of motion and neck supple. No tenderness.     Right lower leg: No edema.     Left lower leg: No edema.  Lymphadenopathy:     Cervical: No cervical adenopathy.  Skin:    General: Skin is warm and dry.     Coloration: Skin is not jaundiced.     Findings: No rash.  Neurological:     Mental Status: He is alert and oriented to person, place, and time.     Cranial Nerves: No cranial nerve deficit.  Psychiatric:        Mood and Affect: Mood normal.        Behavior: Behavior normal.        Thought Content: Thought content normal.     LABS:      Latest Ref Rng & Units 10/15/2022   12:00 AM 09/17/2022   12:00 AM 08/20/2022   10:11 AM  CBC  WBC  8.9     10.7  C    8.9   Hemoglobin 13.5 - 17.5 10.9     10.3  C    10.3   Hematocrit 41 - 53 33     31  C    32.2  Platelets 150 - 400 K/uL 259     250  C    261     C Corrected result   This result is from an external source.      Latest Ref Rng & Units 08/20/2022   10:02 AM 04/30/2022   12:00 AM 04/16/2022   12:00 AM  CMP  Glucose 70 - 99 mg/dL 960     BUN 8 - 23 mg/dL 51  40     38      Creatinine 0.61 - 1.24 mg/dL 4.54  2.2     2.2      Sodium 135 - 145 mmol/L 135  137     137      Potassium 3.5 - 5.1 mmol/L 4.5  4.3     4.4      Chloride 98 - 111 mmol/L 103  104     105      CO2 22 - 32 mmol/L Calcium 8.9 - 10.3 mg/dL 9.1  9.4     9.5      Total Protein 6.5 - 8.1 g/dL 7.3     Total Bilirubin 0.3 - 1.2 mg/dL 0.4     Alkaline Phos 38 - 126 U/L 64  84     87      AST 15 - 41 U/L 30  38     47      ALT 0 - 44 U/L This result is from an external source.     No results found for: "CEA1", "CEA" / No results found for: "CEA1", "CEA" No results found for: "PSA1" No results found for: "CAN199" No results found for: "CAN125"  Lab Results  Component Value Date   TOTALPROTELP 6.5 03/27/2022   ALBUMINELP 3.4 03/27/2022   A1GS  0.2 03/27/2022   A2GS 0.6 03/27/2022   BETS 0.8 03/27/2022   GAMS 1.4 03/27/2022   MSPIKE Not Observed 03/27/2022   SPEI Comment 03/27/2022   Lab Results  Component Value Date   TIBC 258 08/20/2022   TIBC 256 04/16/2022   TIBC 272 03/27/2022   FERRITIN 109 08/20/2022   FERRITIN 125 04/16/2022   FERRITIN 103 03/27/2022   IRONPCTSAT 24 08/20/2022   IRONPCTSAT 24 04/16/2022   IRONPCTSAT 19 03/27/2022   Lab Results  Component Value Date   LDH 201 (H) 03/27/2022       Component Value Date/Time   TOTALPROTELP 6.5 03/27/2022 1453   ALBUMINELP 3.4 03/27/2022 1453   A1GS 0.2 03/27/2022 1453   A2GS 0.6 03/27/2022 1453   BETS 0.8 03/27/2022 1453   GAMS 1.4 03/27/2022 1453   MSPIKE Not Observed 03/27/2022 1453   SPEI Comment 03/27/2022 1453   LDH 201 (H) 03/27/2022 1453    Review Flowsheet  More data exists      Latest Ref Rng & Units 03/27/2022 04/16/2022 08/20/2022  Oncology Labs  Ferritin 24 - 336 ng/mL 103  125  109   %SAT 17.9 - 39.5 % Total Protein ELP 6.0 - 8.5 g/dL 6.5  - -  Albumin ELP 2.9 - 4.4 g/dL 3.4  - -  Alpha-1 Globulin 0.0 - 0.4 g/dL 0.2  - -  Alpha-2 Globulin 0.4 - 1.0 g/dL 0.6  - -  Beta Globulin 0.7 - 1.3 g/dL 0.8  - -  Gamma Globulin 0.4 - 1.8 g/dL 1.4  - -  M-Spike, % Not Observed g/dL Not Observed  - -  SPE Interp. - Comment  - -  LDH 98 - 192 U/L 201  - -     STUDIES:  No results found.    ASSESSMENT & PLAN:   Assessment/Plan:  87 y.o. male with ***  The patient understands all the plans discussed today and is in agreement with them.  He knows to contact our office if he develops concerns prior to his next appointment    Adah Perl, PA-C

## 2022-11-12 ENCOUNTER — Telehealth: Payer: Self-pay | Admitting: Hematology and Oncology

## 2022-11-12 ENCOUNTER — Inpatient Hospital Stay: Payer: Medicare Other

## 2022-11-12 ENCOUNTER — Inpatient Hospital Stay: Payer: Medicare Other | Admitting: Hematology and Oncology

## 2022-11-12 ENCOUNTER — Telehealth: Payer: Self-pay

## 2022-11-12 DIAGNOSIS — I441 Atrioventricular block, second degree: Secondary | ICD-10-CM | POA: Diagnosis not present

## 2022-11-12 DIAGNOSIS — I21A9 Other myocardial infarction type: Secondary | ICD-10-CM | POA: Diagnosis not present

## 2022-11-12 DIAGNOSIS — I252 Old myocardial infarction: Secondary | ICD-10-CM | POA: Diagnosis not present

## 2022-11-12 DIAGNOSIS — I10 Essential (primary) hypertension: Secondary | ICD-10-CM | POA: Diagnosis not present

## 2022-11-12 DIAGNOSIS — I350 Nonrheumatic aortic (valve) stenosis: Secondary | ICD-10-CM | POA: Diagnosis not present

## 2022-11-12 DIAGNOSIS — I499 Cardiac arrhythmia, unspecified: Secondary | ICD-10-CM | POA: Diagnosis not present

## 2022-11-12 DIAGNOSIS — I130 Hypertensive heart and chronic kidney disease with heart failure and stage 1 through stage 4 chronic kidney disease, or unspecified chronic kidney disease: Secondary | ICD-10-CM | POA: Diagnosis not present

## 2022-11-12 DIAGNOSIS — R079 Chest pain, unspecified: Secondary | ICD-10-CM | POA: Diagnosis not present

## 2022-11-12 DIAGNOSIS — E7841 Elevated Lipoprotein(a): Secondary | ICD-10-CM | POA: Diagnosis not present

## 2022-11-12 DIAGNOSIS — Z87891 Personal history of nicotine dependence: Secondary | ICD-10-CM | POA: Diagnosis not present

## 2022-11-12 DIAGNOSIS — I509 Heart failure, unspecified: Secondary | ICD-10-CM | POA: Diagnosis not present

## 2022-11-12 DIAGNOSIS — I214 Non-ST elevation (NSTEMI) myocardial infarction: Secondary | ICD-10-CM | POA: Diagnosis not present

## 2022-11-12 DIAGNOSIS — I959 Hypotension, unspecified: Secondary | ICD-10-CM | POA: Diagnosis not present

## 2022-11-12 DIAGNOSIS — N189 Chronic kidney disease, unspecified: Secondary | ICD-10-CM | POA: Diagnosis not present

## 2022-11-12 DIAGNOSIS — N184 Chronic kidney disease, stage 4 (severe): Secondary | ICD-10-CM | POA: Diagnosis not present

## 2022-11-12 DIAGNOSIS — E1122 Type 2 diabetes mellitus with diabetic chronic kidney disease: Secondary | ICD-10-CM | POA: Diagnosis not present

## 2022-11-12 DIAGNOSIS — I25112 Atherosclerotic heart disease of native coronary artery with refractory angina pectoris: Secondary | ICD-10-CM | POA: Diagnosis not present

## 2022-11-12 DIAGNOSIS — Z8546 Personal history of malignant neoplasm of prostate: Secondary | ICD-10-CM | POA: Diagnosis not present

## 2022-11-12 DIAGNOSIS — Z7902 Long term (current) use of antithrombotics/antiplatelets: Secondary | ICD-10-CM | POA: Diagnosis not present

## 2022-11-12 DIAGNOSIS — I25110 Atherosclerotic heart disease of native coronary artery with unstable angina pectoris: Secondary | ICD-10-CM | POA: Diagnosis not present

## 2022-11-12 DIAGNOSIS — T82855A Stenosis of coronary artery stent, initial encounter: Secondary | ICD-10-CM | POA: Diagnosis not present

## 2022-11-12 DIAGNOSIS — Z794 Long term (current) use of insulin: Secondary | ICD-10-CM | POA: Diagnosis not present

## 2022-11-12 DIAGNOSIS — Z955 Presence of coronary angioplasty implant and graft: Secondary | ICD-10-CM | POA: Diagnosis not present

## 2022-11-12 DIAGNOSIS — J439 Emphysema, unspecified: Secondary | ICD-10-CM | POA: Diagnosis not present

## 2022-11-12 DIAGNOSIS — I2510 Atherosclerotic heart disease of native coronary artery without angina pectoris: Secondary | ICD-10-CM | POA: Diagnosis not present

## 2022-11-12 DIAGNOSIS — I129 Hypertensive chronic kidney disease with stage 1 through stage 4 chronic kidney disease, or unspecified chronic kidney disease: Secondary | ICD-10-CM | POA: Diagnosis not present

## 2022-11-12 DIAGNOSIS — J449 Chronic obstructive pulmonary disease, unspecified: Secondary | ICD-10-CM | POA: Diagnosis not present

## 2022-11-12 DIAGNOSIS — I440 Atrioventricular block, first degree: Secondary | ICD-10-CM | POA: Diagnosis not present

## 2022-11-12 DIAGNOSIS — E785 Hyperlipidemia, unspecified: Secondary | ICD-10-CM | POA: Diagnosis not present

## 2022-11-12 NOTE — Telephone Encounter (Signed)
Angelique Blonder made [place] aware patient is in [facility] ED for chest pain, today's appt cancelled. Dorothy Spark at [facility] made aware as patient was scheduled for injection there tomorrow.

## 2022-11-12 NOTE — Telephone Encounter (Signed)
11/12/22 Wife cancelled appts.Patient having chest pain and is in the ER

## 2022-11-13 ENCOUNTER — Ambulatory Visit: Payer: Medicare Other

## 2022-11-14 DIAGNOSIS — I110 Hypertensive heart disease with heart failure: Secondary | ICD-10-CM | POA: Diagnosis not present

## 2022-11-17 ENCOUNTER — Telehealth: Payer: Self-pay | Admitting: Cardiology

## 2022-11-17 NOTE — Telephone Encounter (Addendum)
Patient's wife called stating patient had another MI, she called for a follow up appt and wasn't able to get an appt with Dr.Munley until 7/3.  She just wanted to let Dr. Dulce Sellar know.   She said patient went to [facility] in [facility]. She said patient has appt with Dr. Casper Harrison later this week.

## 2022-11-19 DIAGNOSIS — N184 Chronic kidney disease, stage 4 (severe): Secondary | ICD-10-CM | POA: Diagnosis not present

## 2022-11-20 DIAGNOSIS — I25119 Atherosclerotic heart disease of native coronary artery with unspecified angina pectoris: Secondary | ICD-10-CM | POA: Diagnosis not present

## 2022-11-20 DIAGNOSIS — T82897S Other specified complication of cardiac prosthetic devices, implants and grafts, sequela: Secondary | ICD-10-CM | POA: Diagnosis not present

## 2022-11-20 DIAGNOSIS — I252 Old myocardial infarction: Secondary | ICD-10-CM | POA: Diagnosis not present

## 2022-11-20 DIAGNOSIS — K2960 Other gastritis without bleeding: Secondary | ICD-10-CM | POA: Diagnosis not present

## 2022-11-20 NOTE — Telephone Encounter (Signed)
Called patient's wife and she left the message in patient call's below:  "Patient's wife called stating patient had another MI, she called for a follow up appt and wasn't able to get an appt with Dr.Munley until 7/3.  She just wanted to let Dr. Dulce Sellar know.   She said patient went to [facility] in [facility]. She said patient has appt with Dr. Casper Harrison later this week."  Patient's wife stated that she would like an appointment sooner than 7/3./24 if possible. Spoke with Wallis Bamberg, NP and she said to add him to her schedule. Patient was scheduled to see Wallis Bamberg, NP on 11/24/22. Patient's wife had no further questions at this time.

## 2022-11-22 NOTE — Progress Notes (Unsigned)
Cardiology Office Note:    Date:  11/24/2022   ID:  Troy [name], DOB January 28, 1936, MRN [id number]  PCP:  Street, Stephanie Coup, MD   [facility] Providers Cardiologist:  Norman Herrlich, MD     Referring MD: Troy [name], *   CC: follow up CAD, recent PCI  History of Present Illness:    Troy [name] is a 87 y.o. male with a hx of CAD, HFrEF, aortic stenosis, CKD stage IV, second-degree heart block, PAD s/p right common iliac stent 2020, lung nodule, GERD, chronic pancreatitis, hyperlipidemia, DM 2, anemia due to CKD, prostate cancer, bradycardia, aortic stenosis.  LHC on 02/12/2022 revealed severe one-vessel coronary artery disease of the RCA, patent right coronary artery stents with moderate diffuse in-stent restenosis.  He underwent a staged PCI on 02/14/2022 with successful PCI to RCA with placement of proximal and distal DE S that overlapped the previously placed stent.  Recommendations for indefinite DAPT, aspirin and Plavix.  Echo on 03/28/2022 revealed mild LVH, impaired relaxation, EF 55 to 60%, mild bilateral dilatation of the atrium, mild MR, moderate aortic stenosis, mild pulmonic valve regurgitation, RVSP elevated at 41 mmHg.  Most recently he was evaluated by Dr. Dulce Sellar on 10/23/2022, at that time he was doing well from a cardiac perspective.  He did have exertional angina however this had been present for some time and overall unchanged.  [name] was admitted to [facility] on 11/12/2022 to 11/13/2022, with complaint of chest pain that started 4:30 in the morning, he states he woke up to use the bathroom and started having chest pain, patient denied any aggravating or relieving factors.  Initial blood pressure was elevated at 185/71, creatinine 2.73 but appear to be near his baseline, initial troponin 40 > 237.  He underwent left heart cath which revealed 90% in-stent restenosis of proximal and mid portions of previously placed RCA  stents, treated with balloon angioplasty with improvement in lumen diameter to 50% proximal and 30% mid.  Nonobstructive disease in the RCA.  He did have an episode of hypotension which resolved with holding his blood pressure medications and IV fluids.  His Marcelline Deist was held at discharge.  Stable H&H, sodium 139, potassium 4.3, creatinine 2.46 on day of discharge.  He presents today for follow-up accompanied by his wife after his recent PCI.  He denies further incidents of chest pain, shortness of breath, palpitations. He checks his BP, HR and weight daily, and he feels the best when his BP is closer to 140 systolic, much lower and he feels washed out. He states his HR will get up towards 90's and this is bothersome to him. He has had f/u with his PCP and lab work revealed stable kidney function. His radial site is without hematoma, palpable pulse proximal to and distal to insertion site.   Past Medical History:  Diagnosis Date   Anemia 12/09/2015   Atherosclerosis    CAD in native artery 12/09/2015   Overview:  PCI/ stent 2001   Chronic diastolic heart failure (HCC) 12/09/2015   Chronic pancreatitis (HCC)    CKD (chronic kidney disease)    3B   Coronary artery disease involving native coronary artery of native heart with angina pectoris (HCC) 12/09/2015   Overview:  PCI/ stent 2001   Diabetes (HCC)    GERD (gastroesophageal reflux disease)    Hearing loss    Hyperlipidemia 12/09/2015   Hypertensive heart disease with heart failure (HCC) 12/09/2015  Macular degeneration    legally blind   MI (myocardial infarction) (HCC)    Necrotizing pancreatitis 06/26/2015   Prostate cancer [facility])    radiation treatments here   Pseudocyst of pancreas 06/26/2015   PVD (peripheral vascular disease) (HCC)    Second degree heart block     Past Surgical History:  Procedure Laterality Date   ABDOMINAL AORTOGRAM W/LOWER EXTREMITY Bilateral 09/10/2018   Procedure: ABDOMINAL AORTOGRAM W/LOWER EXTREMITY;   Surgeon: Sherren Kerns, MD;  Location: [facility] INVASIVE CV LAB;  Service: Cardiovascular;  Laterality: Bilateral;   CHOLECYSTECTOMY     CORONARY STENT INTERVENTION N/A 02/14/2022   Procedure: CORONARY STENT INTERVENTION;  Surgeon: Yvonne Kendall, MD;  Location: [facility] INVASIVE CV LAB;  Service: Cardiovascular;  Laterality: N/A;   CORONARY ULTRASOUND/IVUS N/A 02/14/2022   Procedure: Intravascular Ultrasound/IVUS;  Surgeon: Yvonne Kendall, MD;  Location: [facility] INVASIVE CV LAB;  Service: Cardiovascular;  Laterality: N/A;   LEFT HEART CATH N/A 02/14/2022   Procedure: Left Heart Cath;  Surgeon: Yvonne Kendall, MD;  Location: [facility] INVASIVE CV LAB;  Service: Cardiovascular;  Laterality: N/A;   LEFT HEART CATH AND CORONARY ANGIOGRAPHY N/A 02/12/2022   Procedure: LEFT HEART CATH AND CORONARY ANGIOGRAPHY;  Surgeon: Iran Ouch, MD;  Location: [facility] INVASIVE CV LAB;  Service: Cardiovascular;  Laterality: N/A;   PERIPHERAL VASCULAR INTERVENTION Right 09/10/2018   Procedure: PERIPHERAL VASCULAR INTERVENTION;  Surgeon: Sherren Kerns, MD;  Location: [facility] INVASIVE CV LAB;  Service: Cardiovascular;  Laterality: Right;  common iliac   TONSILLECTOMY AND ADENOIDECTOMY     TOTAL KNEE REVISION Bilateral     Current Medications: Current Meds  Medication Sig   acetaminophen (TYLENOL) 500 MG tablet Take 500-1,000 mg by mouth every 6 (six) hours as needed for moderate pain.   ALPRAZolam (XANAX) 0.25 MG tablet 0.5 - 1 tablet 1-3 times daily prn   For anxiety   amLODipine (NORVASC) 5 MG tablet Take 1 tablet (5 mg total) by mouth in the morning and at bedtime.   aspirin EC 81 MG tablet Take 1 tablet by mouth daily.   atorvastatin (LIPITOR) 80 MG tablet TAKE 1 TABLET EACH EVENING   B Complex Vitamins (VITAMIN B COMPLEX) TABS Take 1 tablet by mouth daily.   cholecalciferol (VITAMIN D3) 25 MCG (1000 UNIT) tablet Take 1,000 Units by mouth daily.   Cinnamon 500 MG capsule Take 500 mg by mouth daily.   clopidogrel (PLAVIX) 75  MG tablet Take 75 mg by mouth daily.   co-enzyme Q-10 30 MG capsule Take 30 mg by mouth daily.   dapagliflozin propanediol (FARXIGA) 10 MG TABS tablet Take by mouth daily. Currently on hold after stent placed per patient   doxazosin (CARDURA) 2 MG tablet Take 2 mg by mouth at bedtime.    hydrALAZINE (APRESOLINE) 25 MG tablet Take 25 mg by mouth 3 (three) times daily.   insulin glargine (LANTUS) 100 UNIT/ML injection Inject 12 Units into the skin daily.   Iron Combinations (IRON COMPLEX PO) Take 65 mg by mouth daily.   isosorbide dinitrate (ISORDIL) 40 MG tablet Take 40 mg by mouth 2 (two) times daily.   metoprolol tartrate (LOPRESSOR) 25 MG tablet Take 1 tablet (25 mg total) by mouth daily as needed (Heart rate greater than 90.).   Multiple Vitamin (MULTIVITAMIN) tablet Take 1 tablet by mouth daily.   Multiple Vitamins-Minerals (EYE VITAMINS PO) Take 1 tablet by mouth daily.   nitroGLYCERIN (NITROSTAT) 0.4 MG SL tablet Place 1 tablet (0.4 mg total) under  the tongue every 5 (five) minutes as needed for chest pain.   omeprazole (PRILOSEC) 40 MG capsule Take 40 mg by mouth daily.   Pancrelipase, Lip-Prot-Amyl, (CREON) 24000-76000 units CPEP Take 1 capsule by mouth in the morning, at noon, and at bedtime.   Propylene Glycol (SYSTANE BALANCE) 0.6 % SOLN Place 1 drop into both eyes at bedtime.   vitamin C (ASCORBIC ACID) 500 MG tablet Take 500 mg by mouth daily.     Allergies:   Patient has no known allergies.   Social History   Socioeconomic History   Marital status: Married    Spouse name: [name]   Number of children: 3   Years of education: 16   Highest education level: Associate degree: academic program  Occupational History   Occupation: Retired   Occupation: retired Education officer, environmental  Tobacco Use   Smoking status: Never    Passive exposure: Past   Smokeless tobacco: Never  Vaping Use   Vaping Use: Never used  Substance and Sexual Activity   Alcohol use: No   Drug use: No   Sexual activity:  Not on file  Other Topics Concern   Not on file  Social History Narrative   Not on file   Social Determinants of Health   Financial Resource Strain: Low Risk  (02/14/2022)   Overall Financial Resource Strain (CARDIA)    Difficulty of Paying Living Expenses: Not hard at all  Food Insecurity: No Food Insecurity (02/14/2022)   Hunger Vital Sign    Worried About Running Out of Food in the Last Year: Never true    Ran Out of Food in the Last Year: Never true  Transportation Needs: No Transportation Needs (02/14/2022)   PRAPARE - Administrator, Civil Service (Medical): No    Lack of Transportation (Non-Medical): No  Physical Activity: Not on file  Stress: Not on file  Social Connections: Not on file     Family History: The patient's family history includes Diabetes in his mother; Heart failure in his father and mother.  ROS:   Please see the history of present illness.    All other systems reviewed and are negative.  EKGs/Labs/Other Studies Reviewed:    The following studies were reviewed today:   LHC [facility] 11/12/22- 1. 90% in-stent restenosis of proximal and mid portions of previously-placed RCA stents, treated with balloon angioplasty with improvement in lumen diameter to 50% (proximal) and 30% (mid). 2. Nonobstructive disease in the RCA. 3. 95 cc of contrast 4. LRA access, hemostasis with TR band. 5. No complications. Overnight patient had an hypotensive episode   EKG:  EKG is not ordered today.    Recent Labs: 02/10/2022: B Natriuretic Peptide 845.7 02/15/2022: Magnesium 1.8 04/16/2022: TSH 2.376 08/20/2022: ALT 21; BUN 51; Creatinine 2.68; Potassium 4.5; Sodium 135 10/15/2022: Hemoglobin 10.9; Platelets 259  Recent Lipid Panel    Component Value Date/Time   CHOL 102 02/10/2022 1607   TRIG 21 02/10/2022 1607   HDL 39 (L) 02/10/2022 1607   CHOLHDL 2.6 02/10/2022 1607   VLDL 4 02/10/2022 1607   LDLCALC 59 02/10/2022 1607      Risk Assessment/Calculations:      HYPERTENSION CONTROL Vitals:   11/24/22 1011 11/24/22 1057  BP: (!) 150/60 (!) 158/60    The patient's blood pressure is elevated above target today.  In order to address the patient's elevated BP: Blood pressure will be monitored at home to determine if medication changes need to be  made. (has not taken bp meds today)            Physical Exam:    VS:  BP (!) 158/60   Pulse 72   Ht 5' 9.5" (1.765 m)   Wt 161 lb (73 kg)   SpO2 99%   BMI 23.43 kg/m     Wt Readings from Last 3 Encounters:  11/24/22 161 lb (73 kg)  10/23/22 161 lb (73 kg)  09/17/22 162 lb (73.5 kg)     GEN: appears younger than stated age, well nourished, well developed in no acute distress HEENT: Normal NECK: No JVD; No carotid bruits LYMPHATICS: No lymphadenopathy CARDIAC: RRR, 2/6 systolic murmur R/LSB, no rubs, no gallops RESPIRATORY:  Clear to auscultation without rales, wheezing or rhonchi  ABDOMEN: Soft, non-tender, non-distended MUSCULOSKELETAL:  No edema; No deformity  SKIN: Warm and dry NEUROLOGIC:  Alert and oriented x 3 PSYCHIATRIC:  Normal affect   ASSESSMENT:    1. CAD in native artery   2. CKD (chronic kidney disease) stage 4, GFR 15-29 ml/min (HCC)   3. Mobitz (type) I (Wenckebach's) atrioventricular block   4. Nonrheumatic aortic valve stenosis   5. Sinus bradycardia on ECG   6. Essential hypertension    PLAN:    In order of problems listed above:  CAD-most recent LHC on 11/13/2022 90% in-stent restenosis of proximal and mid portions of previously placed RCA stents, treated with balloon angioplasty with improvement in lumen diameter to 50% proximal and 30% mid. Continue ASA 81 mg daily, continue Lipitor 80 mg daily, continue Plavix 75 mg daily, continue isosorbide dinitrate 40 mg twice daily.   CKD stage IV- most recent creatinine 2.9 by PCP last week, he returns later this week for repeat labs. Careful titration of diuretics and  antihypertensives.   Aortic valve stenosis-Denies SOB, syncope, dizziness, moderate on most recent echo in September 2023, will plan to repeat in September 2024.   Hypertension- BP today is elevated, reviewed BP log and they are mostly controlled at home. Continue hydralazine 25 mg three times/day, continue Norvasc 5 mg daily. Would like to titrate up his medications, however he is resolute that he feels poorly when his BP is < 140's.  LDL - LDL 59 on 02/10/22, continue Lipitor 80 mg daily.  Sinus bradycardia/Mobitz type I AV block- most recent EKG revealed first degree AV block, HR 74. Today in office, HR is 90, HR log reports 80-90s, wife would like PRN dose of metoprolol to take PRN for HR > 90 as she states he feels poorly when his heart is racing. They monitor his HR multiple times throughout the day and are well versed with when to give. Will monitor this closely, and if his HR begins to persistently drop, will stop.    Disposition - Return in 6-8 weeks. Metoprolol tartrate 25 mg PRN for sustained HR > 90, will monitor this carefully.           Medication Adjustments/Labs and Tests Ordered: Current medicines are reviewed at length with the patient today.  Concerns regarding medicines are outlined above.  No orders of the defined types were placed in this encounter.  Meds ordered this encounter  Medications   metoprolol tartrate (LOPRESSOR) 25 MG tablet    Sig: Take 1 tablet (25 mg total) by mouth daily as needed (Heart rate greater than 90.).    Dispense:  90 tablet    Refill:  3    Patient Instructions  Medication Instructions:  Your  physician has recommended you make the following change in your medication:   Take Metoprolol Tartrate 25 mg by mouth daily as needed for heart rate greater than 90.  *If you need a refill on your cardiac medications before your next appointment, please call your pharmacy*   Lab Work: None ordered If you have labs (blood work) drawn today  and your tests are completely normal, you will receive your results only by: MyChart Message (if you have MyChart) OR A paper copy in the mail If you have any lab test that is abnormal or we need to change your treatment, we will call you to review the results.   Testing/Procedures: None ordered   Follow-Up: At [facility], you and your health needs are our priority.  As part of our continuing mission to provide you with exceptional heart care, we have created designated Provider Care Teams.  These Care Teams include your primary Cardiologist (physician) and Advanced Practice Providers (APPs -  Physician Assistants and Nurse Practitioners) who all work together to provide you with the care you need, when you need it.  We recommend signing up for the patient portal called "MyChart".  Sign up information is provided on this After Visit Summary.  MyChart is used to connect with patients for Virtual Visits (Telemedicine).  Patients are able to view lab/test results, encounter notes, upcoming appointments, etc.  Non-urgent messages can be sent to your provider as well.   To learn more about what you can do with MyChart, go to [email or web address].    Your next appointment:   6-8 week(s)  The format for your next appointment:   In Person  Provider:   Norman Herrlich, MD or Wallis Bamberg, NP [facility])    Other Instructions none  Important Information About Sugar        Signed, Flossie Dibble, NP  11/24/2022 11:05 AM    [facility]

## 2022-11-24 ENCOUNTER — Ambulatory Visit: Payer: Medicare Other | Attending: Cardiology | Admitting: Cardiology

## 2022-11-24 ENCOUNTER — Encounter: Payer: Self-pay | Admitting: Cardiology

## 2022-11-24 VITALS — BP 158/60 | HR 72 | Ht 69.5 in | Wt 161.0 lb

## 2022-11-24 DIAGNOSIS — R001 Bradycardia, unspecified: Secondary | ICD-10-CM | POA: Diagnosis present

## 2022-11-24 DIAGNOSIS — I350 Nonrheumatic aortic (valve) stenosis: Secondary | ICD-10-CM

## 2022-11-24 DIAGNOSIS — I2510 Atherosclerotic heart disease of native coronary artery without angina pectoris: Secondary | ICD-10-CM

## 2022-11-24 DIAGNOSIS — I441 Atrioventricular block, second degree: Secondary | ICD-10-CM | POA: Diagnosis present

## 2022-11-24 DIAGNOSIS — I10 Essential (primary) hypertension: Secondary | ICD-10-CM | POA: Diagnosis present

## 2022-11-24 DIAGNOSIS — N184 Chronic kidney disease, stage 4 (severe): Secondary | ICD-10-CM | POA: Diagnosis present

## 2022-11-24 MED ORDER — METOPROLOL TARTRATE 25 MG PO TABS: 25.0000 mg | ORAL_TABLET | Freq: Every day | ORAL | 3 refills | Status: DC | PRN

## 2022-11-24 NOTE — Patient Instructions (Signed)
Medication Instructions:  Your physician has recommended you make the following change in your medication:   Take Metoprolol Tartrate 25 mg by mouth daily as needed for heart rate greater than 90.  *If you need a refill on your cardiac medications before your next appointment, please call your pharmacy*   Lab Work: None ordered If you have labs (blood work) drawn today and your tests are completely normal, you will receive your results only by: MyChart Message (if you have MyChart) OR A paper copy in the mail If you have any lab test that is abnormal or we need to change your treatment, we will call you to review the results.   Testing/Procedures: None ordered   Follow-Up: At [facility], you and your health needs are our priority.  As part of our continuing mission to provide you with exceptional heart care, we have created designated Provider Care Teams.  These Care Teams include your primary Cardiologist (physician) and Advanced Practice Providers (APPs -  Physician Assistants and Nurse Practitioners) who all work together to provide you with the care you need, when you need it.  We recommend signing up for the patient portal called "MyChart".  Sign up information is provided on this After Visit Summary.  MyChart is used to connect with patients for Virtual Visits (Telemedicine).  Patients are able to view lab/test results, encounter notes, upcoming appointments, etc.  Non-urgent messages can be sent to your provider as well.   To learn more about what you can do with MyChart, go to [email or web address].    Your next appointment:   6-8 week(s)  The format for your next appointment:   In Person  Provider:   Norman Herrlich, MD or Wallis Bamberg, NP Rosalita Levan)    Other Instructions none  Important Information About Sugar

## 2022-11-25 DIAGNOSIS — I110 Hypertensive heart disease with heart failure: Secondary | ICD-10-CM | POA: Diagnosis not present

## 2022-11-25 DIAGNOSIS — N184 Chronic kidney disease, stage 4 (severe): Secondary | ICD-10-CM | POA: Diagnosis not present

## 2022-11-25 DIAGNOSIS — D044 Carcinoma in situ of skin of scalp and neck: Secondary | ICD-10-CM | POA: Diagnosis not present

## 2022-11-25 DIAGNOSIS — E1122 Type 2 diabetes mellitus with diabetic chronic kidney disease: Secondary | ICD-10-CM | POA: Diagnosis not present

## 2022-11-25 DIAGNOSIS — C4442 Squamous cell carcinoma of skin of scalp and neck: Secondary | ICD-10-CM | POA: Diagnosis not present

## 2022-11-26 DIAGNOSIS — Z794 Long term (current) use of insulin: Secondary | ICD-10-CM | POA: Diagnosis not present

## 2022-11-26 DIAGNOSIS — E538 Deficiency of other specified B group vitamins: Secondary | ICD-10-CM | POA: Diagnosis not present

## 2022-11-26 DIAGNOSIS — Z125 Encounter for screening for malignant neoplasm of prostate: Secondary | ICD-10-CM | POA: Diagnosis not present

## 2022-11-26 DIAGNOSIS — N184 Chronic kidney disease, stage 4 (severe): Secondary | ICD-10-CM | POA: Diagnosis not present

## 2022-11-26 DIAGNOSIS — D638 Anemia in other chronic diseases classified elsewhere: Secondary | ICD-10-CM | POA: Diagnosis not present

## 2022-11-26 DIAGNOSIS — N1832 Chronic kidney disease, stage 3b: Secondary | ICD-10-CM | POA: Diagnosis not present

## 2022-11-26 DIAGNOSIS — E785 Hyperlipidemia, unspecified: Secondary | ICD-10-CM | POA: Diagnosis not present

## 2022-11-26 DIAGNOSIS — E1122 Type 2 diabetes mellitus with diabetic chronic kidney disease: Secondary | ICD-10-CM | POA: Diagnosis not present

## 2022-11-26 DIAGNOSIS — Z79899 Other long term (current) drug therapy: Secondary | ICD-10-CM | POA: Diagnosis not present

## 2022-11-26 DIAGNOSIS — E559 Vitamin D deficiency, unspecified: Secondary | ICD-10-CM | POA: Diagnosis not present

## 2022-11-27 DIAGNOSIS — I25119 Atherosclerotic heart disease of native coronary artery with unspecified angina pectoris: Secondary | ICD-10-CM | POA: Diagnosis not present

## 2022-11-27 DIAGNOSIS — Z0000 Encounter for general adult medical examination without abnormal findings: Secondary | ICD-10-CM | POA: Diagnosis not present

## 2022-11-27 DIAGNOSIS — I110 Hypertensive heart disease with heart failure: Secondary | ICD-10-CM | POA: Diagnosis not present

## 2022-11-27 DIAGNOSIS — T82897S Other specified complication of cardiac prosthetic devices, implants and grafts, sequela: Secondary | ICD-10-CM | POA: Diagnosis not present

## 2022-11-27 DIAGNOSIS — I5022 Chronic systolic (congestive) heart failure: Secondary | ICD-10-CM | POA: Diagnosis not present

## 2022-12-01 ENCOUNTER — Telehealth: Payer: Self-pay | Admitting: Cardiology

## 2022-12-01 NOTE — Telephone Encounter (Signed)
Pt c/o medication issue:  1. Name of Medication: isosorbide dinitrate (ISORDIL) 40 MG tablet   2. How are you currently taking this medication (dosage and times per day)? As written   3. Are you having a reaction (difficulty breathing--STAT)? no  4. What is your medication issue? Pt spouse states "he just goes out when he takes this medication." She states his bp has dropped very low since taking it. She states they talked to PCP and they suggested isosorbide mononitrate and they would like to know what Dr. Dulce Sellar suggests.

## 2022-12-01 NOTE — Telephone Encounter (Signed)
No VM  

## 2022-12-03 ENCOUNTER — Other Ambulatory Visit: Payer: Self-pay

## 2022-12-03 NOTE — Telephone Encounter (Signed)
Patient returned RN's call stating her phone dropped the previous call in progress.

## 2022-12-03 NOTE — Telephone Encounter (Signed)
Wife reports that pt is much better as the hospital adjusted his medications.

## 2022-12-03 NOTE — Telephone Encounter (Signed)
Called the patient's wife Troy [name] and informed her of Dr. Kem Parkinson recommendation below:  "Very likely that the medicine is stopping his blood pressure and he is short stop that medicine and keep a track of blood pressures.  If he has any chest pains after stopping medicines let us know he can use sublingual nitroglycerin or we can even initiate him on Ranexa in the future if needed."  Patient's wife Troy [name] verbalized understanding and had no further questions at this time.

## 2022-12-15 NOTE — Progress Notes (Signed)
Cardiology Office Note:    Date:  12/16/2022   ID:  Troy [name], DOB 1935/11/21, MRN [id number]  PCP:  Street, Stephanie Coup, MD  Cardiologist:  Norman Herrlich, MD    Referring MD: [address], Stephanie Coup, *    ASSESSMENT:    1. CAD in native artery   2. Hypertensive heart disease with heart failure (HCC)   3. CKD (chronic kidney disease) stage 4, GFR 15-29 ml/min (HCC)   4. Pure hypercholesterolemia   5. Nonrheumatic aortic valve stenosis   6. Sinus bradycardia on ECG   7. Mobitz (type) I (Wenckebach's) atrioventricular block    PLAN:    In order of problems listed above:  Improved after recent PCI and stent unfortunately has in-stent restenosis continue medical therapy although he is at risk of another recurrence. Continue his antiplatelet therapy Avoid Isordil Continue his current high intensity statin On good antihypertensives continue his calcium channel blocker and low-dose beta-blocker well-tolerated without clinical recurrence of his previous bradycardia He is iron deficient saturation less than 20% and refer back to hematology Continue oral iron    Next appointment: 3 months   Medication Adjustments/Labs and Tests Ordered: Current medicines are reviewed at length with the patient today.  Concerns regarding medicines are outlined above.  No orders of the defined types were placed in this encounter.  No orders of the defined types were placed in this encounter.      History of Present Illness:    Troy [name] is a 87 y.o. male with a hx of CAD heart failure reduced ejection fraction aortic stenosis stage IV CKD second-degree AV block PAD status post right common iliac artery stent 2020 hyperlipidemia type 2 diabetes anemia due to CKD prostate cancer last seen 11/24/2022.  He was admitted to [facility] campus 11/12/2022 with chest pain.  He underwent coronary angiography with in-stent restenosis of proximal and midportion of the previous  placed right coronary artery stent treated with balloon angioplasty.  Laboratory studies included hemoglobin 10.2 platelet count 236,000 and high-sensitivity troponin was elevated peak 237 creatinine 2.50 GFR 30 cc/min  EKGs were described as sinus rhythm first-degree AV block with ST abnormality.  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  Compliance with diet, lifestyle and medications: Yes Past Medical History:  Diagnosis Date   Anemia 12/09/2015   Atherosclerosis    CAD in native artery 12/09/2015   Overview:  PCI/ stent 2001   Chronic diastolic heart failure (HCC) 12/09/2015   Chronic pancreatitis (HCC)    CKD (chronic kidney disease)    3B   Coronary artery disease involving native coronary artery of native heart with angina pectoris (HCC) 12/09/2015   Overview:  PCI/ stent 2001   Diabetes (HCC)    GERD (gastroesophageal reflux disease)    Hearing loss    Hyperlipidemia 12/09/2015   Hypertensive heart disease with heart failure (HCC) 12/09/2015   Macular degeneration    legally blind   MI (myocardial infarction) (HCC)    Necrotizing pancreatitis 06/26/2015   Prostate cancer (HCC)    radiation treatments here   Pseudocyst of pancreas 06/26/2015   PVD (peripheral vascular disease) (HCC)    Second degree heart block     Past Surgical  History:  Procedure Laterality Date   ABDOMINAL AORTOGRAM W/LOWER EXTREMITY Bilateral 09/10/2018   Procedure: ABDOMINAL AORTOGRAM W/LOWER EXTREMITY;  Surgeon: Sherren Kerns, MD;  Location: [facility];  Service: Cardiovascular;  Laterality: Bilateral;   CHOLECYSTECTOMY     CORONARY STENT INTERVENTION N/A 02/14/2022   Procedure: CORONARY STENT INTERVENTION;  Surgeon: Yvonne Kendall,  MD;  Location: [facility];  Service: Cardiovascular;  Laterality: N/A;   CORONARY ULTRASOUND/IVUS N/A 02/14/2022   Procedure: Intravascular Ultrasound/IVUS;  Surgeon: Yvonne Kendall, MD;  Location: [facility];  Service: Cardiovascular;  Laterality: N/A;   LEFT HEART CATH N/A 02/14/2022   Procedure: Left Heart Cath;  Surgeon: Yvonne Kendall, MD;  Location: [facility];  Service: Cardiovascular;  Laterality: N/A;   LEFT HEART CATH AND CORONARY ANGIOGRAPHY N/A 02/12/2022   Procedure: LEFT HEART CATH AND CORONARY ANGIOGRAPHY;  Surgeon: Iran Ouch, MD;  Location: [facility];  Service: Cardiovascular;  Laterality: N/A;   PERIPHERAL VASCULAR INTERVENTION Right 09/10/2018   Procedure: PERIPHERAL VASCULAR INTERVENTION;  Surgeon: Sherren Kerns, MD;  Location: [facility];  Service: Cardiovascular;  Laterality: Right;  common iliac   TONSILLECTOMY AND ADENOIDECTOMY     TOTAL KNEE REVISION Bilateral     Current Medications: Current Meds  Medication Sig   acetaminophen (TYLENOL) 500 MG tablet Take 500-1,000 mg by mouth every 6 (six) hours as needed for moderate pain.   ALPRAZolam (XANAX) 0.25 MG tablet 0.5 - 1 tablet 1-3 times daily prn   For anxiety   amLODipine (NORVASC) 5 MG tablet Take 1 tablet (5 mg total) by mouth in the morning and at bedtime.   atorvastatin (LIPITOR) 80 MG tablet TAKE 1 TABLET EACH EVENING   B Complex Vitamins (VITAMIN B COMPLEX) TABS Take 1 tablet by mouth daily.   cholecalciferol (VITAMIN D3) 25 MCG (1000 UNIT) tablet Take 1,000 Units by mouth daily.   Cinnamon 500 MG capsule Take 500 mg by mouth daily.   clopidogrel (PLAVIX) 75 MG tablet Take 75 mg by mouth daily.   co-enzyme Q-10 30 MG capsule Take 30 mg by mouth daily.   doxazosin (CARDURA) 2 MG tablet Take 2 mg by mouth at bedtime.    hydrALAZINE (APRESOLINE) 25 MG tablet Take 25 mg by mouth 3 (three) times daily.   insulin glargine (LANTUS) 100 UNIT/ML injection Inject 12  Units into the skin daily.   Iron Combinations (IRON COMPLEX PO) Take 65 mg by mouth every other day.   metoprolol tartrate (LOPRESSOR) 25 MG tablet Take 1 tablet (25 mg total) by mouth daily as needed (Heart rate greater than 90.).   Multiple Vitamin (MULTIVITAMIN) tablet Take 1 tablet by mouth daily.   Multiple Vitamins-Minerals (EYE VITAMINS PO) Take 1 tablet by mouth daily.   nitroGLYCERIN (NITROSTAT) 0.4 MG SL tablet Place 1 tablet (0.4 mg total) under the tongue every 5 (five) minutes as needed for chest pain.   Pancrelipase, Lip-Prot-Amyl, (CREON) 24000-76000 units CPEP Take 1 capsule by mouth in the morning, at noon, and at bedtime.   pantoprazole (PROTONIX) 40 MG tablet Take 40 mg by mouth daily.   Propylene Glycol (SYSTANE BALANCE) 0.6 % SOLN Place 1 drop into both eyes at bedtime.   vitamin C (ASCORBIC ACID) 500 MG tablet Take 500 mg by mouth daily.   [DISCONTINUED] dapagliflozin propanediol (FARXIGA) 10 MG TABS tablet Take by mouth daily. Currently on hold after stent placed per patient     Allergies:  Patient has no known allergies.   Social History   Socioeconomic History   Marital status: Married    Spouse name: [name]   Number of children: 3   Years of education: 16   Highest education level: Associate degree: academic program  Occupational History   Occupation: Retired   Occupation: retired Education officer, environmental  Tobacco Use   Smoking status: Never    Passive exposure: Past   Smokeless tobacco: Never  Vaping Use   Vaping Use: Never used  Substance and Sexual Activity   Alcohol use: No   Drug use: No   Sexual activity: Not on file  Other Topics Concern   Not on file  Social History Narrative   Not on file   Social Determinants of Health   Financial Resource Strain: Low Risk  (02/14/2022)   Overall Financial Resource Strain (CARDIA)    Difficulty of Paying Living Expenses: Not hard at all  Food Insecurity: No Food Insecurity (02/14/2022)   Hunger Vital Sign    Worried  About Running Out of Food in the Last Year: Never true    Ran Out of Food in the Last Year: Never true  Transportation Needs: No Transportation Needs (02/14/2022)   PRAPARE - Administrator, Civil Service (Medical): No    Lack of Transportation (Non-Medical): No  Physical Activity: Not on file  Stress: Not on file  Social Connections: Not on file     Family History: The patient's family history includes Diabetes in his mother; Heart failure in his father and mother. ROS:   Please see the history of present illness.    All other systems reviewed and are negative.  EKGs/Labs/Other Studies Reviewed:    The following studies were reviewed today:  Cardiac Studies & Procedures   CARDIAC CATHETERIZATION  CARDIAC CATHETERIZATION 02/14/2022  Narrative Conclusions: Severe single-vessel coronary artery disease involving RCA stent and flanking de-novo disease, as detailed in Dr. Jari Sportsman diagnostic catheterization note from 02/12/2022. Normal left ventricular filling pressure (LVEDP 10 mmHg). Successful IVUS-guided PCI to RCA with placement of Synergy 3.5 x 12 mm (proximal) and 3.5 x 24 (distal) drug-eluting stents that overlap the previously placed stent.  The entire stented segment was postdilated to 4.1 mm with <10% residual stenosis and TIMI-3 flow.  Recommendations: Remove right femoral artery sheath when ACT has fallen below 175 seconds with manual compression. Continue indefinite dual antiplatelet therapy with aspirin and clopidogrel. Aggressive secondary prevention. Gentle post-catheterization hydration with close monitoring of renal function.  Yvonne Kendall, MD [facility]  Findings Coronary Findings Diagnostic  Dominance: Right  Right Coronary Artery Ost RCA to Prox RCA lesion is 90% stenosed. The lesion was previously treated . Prox RCA to Mid RCA lesion is 50% stenosed. The lesion was previously treated using a stent (unknown type) . Previously placed stent  displays restenosis. Mid RCA lesion is 60% stenosed.  Right Ventricular Branch RV Branch lesion is 90% stenosed.  Right Posterior Descending Artery RPDA lesion is 50% stenosed.  Right Posterior Atrioventricular Artery There is mild disease in the vessel.  Intervention  Ost RCA to Prox RCA lesion Stent (Also treats lesions: Prox RCA to Mid RCA) A drug-eluting stent was successfully placed using a SYNERGY XD 3.50X12. Post-Intervention Lesion Assessment The intervention was successful. Pre-interventional TIMI flow is 3. Post-intervention TIMI flow is 3. No complications occurred at this lesion. There is a 0% residual stenosis post intervention.  Prox RCA to Mid RCA lesion Stent (Also treats lesions: Ost RCA to Prox  RCA) See details in Ost RCA to Prox RCA lesion. Stent (Also treats lesions: Mid RCA) A drug-eluting stent was successfully placed using a SYNERGY XD 3.50X24. Post-Intervention Lesion Assessment The intervention was successful. Pre-interventional TIMI flow is 3. Post-intervention TIMI flow is 3. No complications occurred at this lesion. There is a 0% residual stenosis post intervention.  Mid RCA lesion Stent (Also treats lesions: Prox RCA to Mid RCA) See details in Prox RCA to Mid RCA lesion. Post-Intervention Lesion Assessment The intervention was successful. Pre-interventional TIMI flow is 3. Post-intervention TIMI flow is 3. No complications occurred at this lesion. There is a 0% residual stenosis post intervention.   CARDIAC CATHETERIZATION 02/12/2022  Narrative   Mid Cx lesion is 30% stenosed.   Prox RCA lesion is 90% stenosed.   Prox RCA to Mid RCA lesion is 50% stenosed.   Mid RCA lesion is 60% stenosed.   RPDA lesion is 50% stenosed.  1.  Severe one-vessel coronary artery disease involving the right coronary artery.  Patent right coronary artery stents with moderate diffuse in-stent restenosis.  However, there is a 90% stenosis at the proximal edge of the  stent with hazy appearance highly suggestive of plaque rupture.  In addition, there is a 60% distal edge stenosis. 2.  Moderate aortic stenosis with mean gradient of 23 mmHg. 3.  Moderately difficult procedure via the right radial artery due to significant tortuosity of the innominate and right subclavian arteries.  Recommendations: 35 mL of contrast was used for today's diagnostic procedure.  Gentle hydration postprocedure. Recommend staged RCA PCI on Friday.  I suspect that the whole length of the lesion within the previously placed stents will need to be treated with another drug-eluting stent. Consider femoral artery access for better support given difficulty engaging the right coronary artery from the right radial artery.  Findings Coronary Findings Diagnostic  Dominance: Right  Left Main Vessel is angiographically normal.  Left Anterior Descending There is mild diffuse disease throughout the vessel.  Second Diagonal Branch Vessel is large in size.  Left Circumflex The vessel exhibits minimal luminal irregularities. Mid Cx lesion is 30% stenosed. The lesion is mildly calcified.  First Obtuse Marginal Branch There is mild disease in the vessel.  Third Obtuse Marginal Branch Vessel is angiographically normal.  Right Coronary Artery Prox RCA lesion is 90% stenosed. The lesion was previously treated . Prox RCA to Mid RCA lesion is 50% stenosed. The lesion was previously treated using a stent (unknown type) . Previously placed stent displays restenosis. Mid RCA lesion is 60% stenosed.  Right Posterior Descending Artery RPDA lesion is 50% stenosed.  Right Posterior Atrioventricular Artery There is mild disease in the vessel.  Intervention  No interventions have been documented.     ECHOCARDIOGRAM  ECHOCARDIOGRAM COMPLETE 02/10/2022  Narrative ECHOCARDIOGRAM REPORT    Patient Name:   Troy [name] Date of Exam: 02/10/2022 Medical Rec #:  [id number]      Height:        71.0 in Accession #:    [id number]     Weight:       168.4 lb Date of Birth:  [birth year]      BSA:          1.960 m Patient Age:    87 years       BP:           138/46 mmHg Patient Gender: M              HR:  47 bpm. Exam Location:  Inpatient  Procedure: 2D Echo, Cardiac Doppler, Color Doppler and 3D Echo  STAT ECHO Reported to: Dr. Carolyne Littles on 02/10/2022 3:00:00 PM.  Indications:    NSTEMI I21.4  History:        Patient has prior history of Echocardiogram examinations, most recent 10/02/2020. CAD; Risk Factors:Dyslipidemia.  Sonographer:    Eulah Pont RDCS Referring Phys: Roselind Messier WOODS  IMPRESSIONS   1. Left ventricular ejection fraction, by estimation, is 55 to 60%. The left ventricle has normal function. The left ventricle demonstrates regional wall motion abnormalities (see scoring diagram/findings for description). Left ventricular diastolic parameters are indeterminate. There is mild hypokinesis of the left ventricular, entire inferior wall. There is mild hypokinesis of the left ventricular, basal inferolateral wall. 2. Right ventricular systolic function is normal. The right ventricular size is normal. There is normal pulmonary artery systolic pressure. 3. Left atrial size was moderately dilated. 4. The mitral valve is normal in structure. Mild to moderate mitral valve regurgitation. No evidence of mitral stenosis. 5. The aortic valve is calcified. There is moderate calcification of the aortic valve. There is moderate thickening of the aortic valve. Aortic valve regurgitation is not visualized. Moderate aortic valve stenosis. 6. The inferior vena cava is normal in size with greater than 50% respiratory variability, suggesting right atrial pressure of 3 mmHg.  Comparison(s): Prior images reviewed side by side. Changes from prior study are noted. Overall similar LVEF, but current study with mild hypokinesis of the inferior/inferolateral wall as  noted.  Conclusion(s)/Recommendation(s): New mild wall motion abnormalities with preserved EF. Moderate aortic stenosis. Sinus bradycardia (as low as 37 bpm) noted throughout study.  FINDINGS Left Ventricle: Left ventricular ejection fraction, by estimation, is 55 to 60%. The left ventricle has normal function. The left ventricle demonstrates regional wall motion abnormalities. Mild hypokinesis of the left ventricular, entire inferior wall. Mild hypokinesis of the left ventricular, basal inferolateral wall. The left ventricular internal cavity size was normal in size. There is no left ventricular hypertrophy. Left ventricular diastolic parameters are indeterminate.  Right Ventricle: The right ventricular size is normal. Right vetricular wall thickness was not well visualized. Right ventricular systolic function is normal. There is normal pulmonary artery systolic pressure. The tricuspid regurgitant velocity is 2.75 m/s, and with an assumed right atrial pressure of 3 mmHg, the estimated right ventricular systolic pressure is 33.2 mmHg.  Left Atrium: Left atrial size was moderately dilated.  Right Atrium: Right atrial size was normal in size.  Pericardium: There is no evidence of pericardial effusion.  Mitral Valve: The mitral valve is normal in structure. There is moderate thickening of the mitral valve leaflet(s). There is moderate calcification of the mitral valve leaflet(s). Mild to moderate mitral valve regurgitation. No evidence of mitral valve stenosis.  Tricuspid Valve: The tricuspid valve is normal in structure. Tricuspid valve regurgitation is mild . No evidence of tricuspid stenosis.  Aortic Valve: The aortic valve is calcified. There is moderate calcification of the aortic valve. There is moderate thickening of the aortic valve. Aortic valve regurgitation is not visualized. Moderate aortic stenosis is present. Aortic valve mean gradient measures 23.3 mmHg. Aortic valve peak gradient  measures 40.6 mmHg. Aortic valve area, by VTI measures 0.99 cm.  Pulmonic Valve: The pulmonic valve was not well visualized. Pulmonic valve regurgitation is not visualized. No evidence of pulmonic stenosis.  Aorta: The aortic root and ascending aorta are structurally normal, with no evidence of dilitation.  Venous: The inferior vena cava  is normal in size with greater than 50% respiratory variability, suggesting right atrial pressure of 3 mmHg.  IAS/Shunts: The interatrial septum was not well visualized.   LEFT VENTRICLE PLAX 2D LVIDd:         4.80 cm      Diastology LVIDs:         3.40 cm      LV e' medial:    9.81 cm/s LV PW:         1.10 cm      LV E/e' medial:  13.0 LV IVS:        1.10 cm      LV e' lateral:   10.20 cm/s LVOT diam:     2.10 cm      LV E/e' lateral: 12.5 LV SV:         87 LV SV Index:   44 LVOT Area:     3.46 cm  3D Volume EF: LV Volumes (MOD)            3D EF:        50 % LV vol d, MOD A2C: 120.0 ml LV EDV:       178 ml LV vol d, MOD A4C: 121.0 ml LV ESV:       89 ml LV vol s, MOD A2C: 46.0 ml  LV SV:        89 ml LV vol s, MOD A4C: 51.0 ml LV SV MOD A2C:     74.0 ml LV SV MOD A4C:     121.0 ml LV SV MOD BP:      75.4 ml  RIGHT VENTRICLE RV S prime:     13.10 cm/s TAPSE (M-mode): 2.3 cm  LEFT ATRIUM             Index        RIGHT ATRIUM           Index LA diam:        4.80 cm 2.45 cm/m   RA Area:     14.40 cm LA Vol (A2C):   69.4 ml 35.41 ml/m  RA Volume:   32.90 ml  16.78 ml/m LA Vol (A4C):   74.9 ml 38.21 ml/m LA Biplane Vol: 75.4 ml 38.47 ml/m AORTIC VALVE AV Area (Vmax):    1.10 cm AV Area (Vmean):   0.88 cm AV Area (VTI):     0.99 cm AV Vmax:           318.67 cm/s AV Vmean:          228.333 cm/s AV VTI:            0.878 m AV Peak Grad:      40.6 mmHg AV Mean Grad:      23.3 mmHg LVOT Vmax:         101.00 cm/s LVOT Vmean:        58.100 cm/s LVOT VTI:          0.250 m LVOT/AV VTI ratio: 0.28  AORTA Ao Root diam: 3.30 cm Ao  Asc diam:  3.50 cm  MITRAL VALVE                  TRICUSPID VALVE MV Area (PHT): 5.13 cm       TR Peak grad:   30.2 mmHg MV Decel Time: 148 msec       TR Vmax:        275.00 cm/s MR Peak grad:  141.1 mmHg MR Mean grad:    97.0 mmHg    SHUNTS MR Vmax:         594.00 cm/s  Systemic VTI:  0.25 m MR Vmean:        471.0 cm/s   Systemic Diam: 2.10 cm MR PISA:         1.01 cm MR PISA Eff ROA: 7 mm MR PISA Radius:  0.40 cm MV E velocity: 128.00 cm/s MV A velocity: 79.10 cm/s MV E/A ratio:  1.62  Jodelle Red MD Electronically signed by Jodelle Red MD Signature Date/Time: 02/10/2022/3:01:01 PM    Final    MONITORS  LONG TERM MONITOR (3-14 DAYS) 10/06/2020  Narrative Patch Wear Time:  6 days and 21 hours (2022-02-21T09:56:40-0500 to 2022-02-28T07:44:07-498)  Patient had a min HR of 21 bpm, max HR of 111 bpm, and avg HR of 59 bpm. Predominant underlying rhythm was Sinus Rhythm. First Degree AV Block was present. 17 episode(s) of AV Block (3rd) occurred, lasting a total of 2 mins 15 secs. Second Degree AV Block-Mobitz I (Wenckebach) was present. Wenckebach was detected within +/- 45 seconds of symptomatic patient event(s). Isolated SVEs were rare (<1.0%), and no SVE Couplets or SVE Triplets were present. Isolated VEs were rare (<1.0%), VE Couplets were rare (<1.0%), and no VE Triplets were present. Difficulty discerning atrial activity at times making definitive diagnosis difficult to ascertain. MD notification criteria for Complete Heart Block and Symptomatic Bradycardia met - report posted prior to notification per account request (TY).  An event monitor was performed for 7 days to evaluate bradycardia Cardiac rhythm was sinus with average, minimum and maximum heart rates of 59 21, and 111 bpm. There were frequent episodes of Herbie Saxon and at x2-1 AV block nocturnally with heart rates in the range of 30 bpm. Ventricular ectopy overall was rare with couplets and  single PVCs. Supraventricular ectopy was rare with no episodes of atrial fibrillation or flutter. The triggered and symptomatic events were predominantly sinus rhythm but frequently occurred during second-degree AV block.  Conclusion frequent episodes of Mobitz 1 second-degree AV block particularly nocturnally at x2-1 conduction effective rate 30 bpm.             Recent Labs: 02/10/2022: B Natriuretic Peptide 845.7 02/15/2022: Magnesium 1.8 04/16/2022: TSH 2.376 08/20/2022: ALT 21; BUN 51; Creatinine 2.68; Potassium 4.5; Sodium 135 10/15/2022: Hemoglobin 10.9; Platelets 259  Recent Lipid Panel    Component Value Date/Time   CHOL 102 02/10/2022 1607   TRIG 21 02/10/2022 1607   HDL 39 (L) 02/10/2022 1607   CHOLHDL 2.6 02/10/2022 1607   VLDL 4 02/10/2022 1607   LDLCALC 59 02/10/2022 1607    Physical Exam:    VS:  BP (!) 162/54 (BP Location: Right Arm, Patient Position: Sitting)   Pulse 82   Ht 5\' 8"  (1.727 m)   Wt 160 lb (72.6 kg)   SpO2 98%   BMI 24.33 kg/m     Wt Readings from Last 3 Encounters:  12/16/22 160 lb (72.6 kg)  11/24/22 161 lb (73 kg)  10/23/22 161 lb (73 kg)     GEN:  Well nourished, well developed in no acute distress HEENT: Normal NECK: No JVD; No carotid bruits LYMPHATICS: No lymphadenopathy CARDIAC: RRR, 2 of 6 AAS murmur localized aortic area does not encompass S2 RESPIRATORY:  Clear to auscultation without rales, wheezing or rhonchi  ABDOMEN: Soft, non-tender, non-distended MUSCULOSKELETAL:  No edema; No deformity  SKIN: Warm and dry NEUROLOGIC:  Alert  and oriented x 3 PSYCHIATRIC:  Normal affect    Signed, Norman Herrlich, MD  12/16/2022 10:13 AM    [facility]

## 2022-12-16 ENCOUNTER — Other Ambulatory Visit: Payer: Self-pay

## 2022-12-16 ENCOUNTER — Encounter: Payer: Self-pay | Admitting: Cardiology

## 2022-12-16 ENCOUNTER — Ambulatory Visit: Payer: Medicare Other | Attending: Cardiology | Admitting: Cardiology

## 2022-12-16 ENCOUNTER — Other Ambulatory Visit: Payer: Self-pay | Admitting: Cardiology

## 2022-12-16 VITALS — BP 162/54 | HR 82 | Ht 68.0 in | Wt 160.0 lb

## 2022-12-16 DIAGNOSIS — I2510 Atherosclerotic heart disease of native coronary artery without angina pectoris: Secondary | ICD-10-CM

## 2022-12-16 DIAGNOSIS — E7800 Pure hypercholesterolemia, unspecified: Secondary | ICD-10-CM | POA: Insufficient documentation

## 2022-12-16 DIAGNOSIS — I441 Atrioventricular block, second degree: Secondary | ICD-10-CM | POA: Diagnosis not present

## 2022-12-16 DIAGNOSIS — N184 Chronic kidney disease, stage 4 (severe): Secondary | ICD-10-CM | POA: Insufficient documentation

## 2022-12-16 DIAGNOSIS — R001 Bradycardia, unspecified: Secondary | ICD-10-CM

## 2022-12-16 DIAGNOSIS — I110 Hypertensive heart disease with heart failure: Secondary | ICD-10-CM | POA: Diagnosis not present

## 2022-12-16 DIAGNOSIS — I350 Nonrheumatic aortic (valve) stenosis: Secondary | ICD-10-CM | POA: Insufficient documentation

## 2022-12-16 DIAGNOSIS — D631 Anemia in chronic kidney disease: Secondary | ICD-10-CM

## 2022-12-16 NOTE — Patient Instructions (Signed)
Medication Instructions:  Your physician has recommended you make the following change in your medication:   STOP: Farxiga START: Iron Complex 65 mg every other day  *If you need a refill on your cardiac medications before your next appointment, please call your pharmacy*   Lab Work: Your physician recommends that you return for lab work in:   Labs today: CBC, BMP  If you have labs (blood work) drawn today and your tests are completely normal, you will receive your results only by: MyChart Message (if you have MyChart) OR A paper copy in the mail If you have any lab test that is abnormal or we need to change your treatment, we will call you to review the results.   Testing/Procedures: None   Follow-Up: At [facility], you and your health needs are our priority.  As part of our continuing mission to provide you with exceptional heart care, we have created designated Provider Care Teams.  These Care Teams include your primary Cardiologist (physician) and Advanced Practice Providers (APPs -  Physician Assistants and Nurse Practitioners) who all work together to provide you with the care you need, when you need it.  We recommend signing up for the patient portal called "MyChart".  Sign up information is provided on this After Visit Summary.  MyChart is used to connect with patients for Virtual Visits (Telemedicine).  Patients are able to view lab/test results, encounter notes, upcoming appointments, etc.  Non-urgent messages can be sent to your provider as well.   To learn more about what you can do with MyChart, go to [email or web address].    Your next appointment:   3 month(s)  Provider:   Norman Herrlich, MD    Other Instructions None

## 2022-12-17 ENCOUNTER — Telehealth: Payer: Self-pay | Admitting: Oncology

## 2022-12-17 LAB — CBC
Hematocrit: 32 % — ABNORMAL LOW (ref 37.5–51.0)
Hemoglobin: 10.5 g/dL — ABNORMAL LOW (ref 13.0–17.7)
MCH: 29.6 pg (ref 26.6–33.0)
MCHC: 32.8 g/dL (ref 31.5–35.7)
MCV: 90 fL (ref 79–97)
Platelets: 266 10*3/uL (ref 150–450)
RBC: 3.55 x10E6/uL — ABNORMAL LOW (ref 4.14–5.80)
RDW: 13.1 % (ref 11.6–15.4)
WBC: 8.2 10*3/uL (ref 3.4–10.8)

## 2022-12-17 LAB — BASIC METABOLIC PANEL
BUN/Creatinine Ratio: 16 (ref 10–24)
BUN: 46 mg/dL — ABNORMAL HIGH (ref 8–27)
CO2: 22 mmol/L (ref 20–29)
Calcium: 9.7 mg/dL (ref 8.6–10.2)
Chloride: 102 mmol/L (ref 96–106)
Creatinine, Ser: 2.79 mg/dL — ABNORMAL HIGH (ref 0.76–1.27)
Glucose: 147 mg/dL — ABNORMAL HIGH (ref 70–99)
Potassium: 4.9 mmol/L (ref 3.5–5.2)
Sodium: 138 mmol/L (ref 134–144)
eGFR: 22 mL/min/{1.73_m2} — ABNORMAL LOW (ref >59)

## 2022-12-17 NOTE — Telephone Encounter (Signed)
12/17/22 Patients wife cancelled appt (Hgb 10.4).Did not want to reschedule

## 2022-12-18 DIAGNOSIS — H353232 Exudative age-related macular degeneration, bilateral, with inactive choroidal neovascularization: Secondary | ICD-10-CM | POA: Diagnosis not present

## 2022-12-23 ENCOUNTER — Ambulatory Visit: Payer: Medicare Other | Admitting: Hematology and Oncology

## 2022-12-23 ENCOUNTER — Other Ambulatory Visit: Payer: Medicare Other

## 2022-12-26 DIAGNOSIS — E1122 Type 2 diabetes mellitus with diabetic chronic kidney disease: Secondary | ICD-10-CM | POA: Diagnosis not present

## 2022-12-26 DIAGNOSIS — I110 Hypertensive heart disease with heart failure: Secondary | ICD-10-CM | POA: Diagnosis not present

## 2022-12-26 DIAGNOSIS — N184 Chronic kidney disease, stage 4 (severe): Secondary | ICD-10-CM | POA: Diagnosis not present

## 2023-01-07 ENCOUNTER — Ambulatory Visit (INDEPENDENT_AMBULATORY_CARE_PROVIDER_SITE_OTHER): Payer: Medicare Other | Admitting: Podiatry

## 2023-01-07 DIAGNOSIS — M79675 Pain in left toe(s): Secondary | ICD-10-CM

## 2023-01-07 DIAGNOSIS — B351 Tinea unguium: Secondary | ICD-10-CM

## 2023-01-07 DIAGNOSIS — M79674 Pain in right toe(s): Secondary | ICD-10-CM

## 2023-01-07 HISTORY — DX: Pain in right toe(s): B35.1

## 2023-01-07 NOTE — Progress Notes (Signed)
       Subjective:  Patient ID: Troy [name], male    DOB: [birth year],  MRN: [id number]   Troy [name] presents to clinic today for:  Chief Complaint  Patient presents with   Nail Problem    Rm 1 RFC bilateral nail trim  . Patient notes nails are thick, discolored, elongated and painful in shoegear when trying to ambulate.    PCP is Street, Stephanie Coup, MD.  Allergies  Allergen Reactions   Isordil [Isosorbide Nitrate]     Review of Systems: Negative except as noted in the HPI.  Objective:  There were no vitals filed for this visit.  Troy [name] is a pleasant 87 y.o. male in NAD. AAO x 3.  Vascular Examination: Capillary refill time is 3-5 seconds to toes bilateral. Palpable pedal pulses b/l LE. Digital hair present b/l. No pedal edema b/l. Skin temperature gradient WNL b/l. No varicosities b/l. No cyanosis or clubbing noted b/l.   Dermatological Examination: Pedal skin with normal turgor, texture and tone b/l. No open wounds. No interdigital macerations b/l. Toenails x10 are 3mm thick, discolored, dystrophic with subungual debris. There is pain with compression of the nail plates.  They are elongated x10  Neurological Examination: Protective sensation intact bilateral LE.   Musculoskeletal Examination: Muscle strength 5/5 to all LE muscle groups b/l.      Latest Ref Rng & Units 02/10/2022    4:07 PM  Hemoglobin A1C  Hemoglobin-A1c 4.8 - 5.6 % 6.8     Assessment/Plan: 1. Pain due to onychomycosis of toenails of both feet     The mycotic toenails were sharply debrided x10 with sterile nail nippers and a power debriding burr to decrease bulk/thickness and length.    Return in about 3 months (around 04/09/2023) for [facility].   Clerance Lav, DPM, FACFAS [facility]     [address] [place], [place] [address]                Office [phone]  Fax [phone]

## 2023-01-09 ENCOUNTER — Ambulatory Visit: Payer: Medicare Other | Admitting: Podiatry

## 2023-01-28 ENCOUNTER — Ambulatory Visit: Payer: Medicare Other | Admitting: Cardiology

## 2023-02-05 DIAGNOSIS — H04123 Dry eye syndrome of bilateral lacrimal glands: Secondary | ICD-10-CM | POA: Diagnosis not present

## 2023-02-05 DIAGNOSIS — H43813 Vitreous degeneration, bilateral: Secondary | ICD-10-CM | POA: Diagnosis not present

## 2023-02-05 DIAGNOSIS — H353232 Exudative age-related macular degeneration, bilateral, with inactive choroidal neovascularization: Secondary | ICD-10-CM | POA: Diagnosis not present

## 2023-02-05 DIAGNOSIS — E119 Type 2 diabetes mellitus without complications: Secondary | ICD-10-CM | POA: Diagnosis not present

## 2023-02-26 DIAGNOSIS — H353232 Exudative age-related macular degeneration, bilateral, with inactive choroidal neovascularization: Secondary | ICD-10-CM | POA: Diagnosis not present

## 2023-02-27 DIAGNOSIS — N184 Chronic kidney disease, stage 4 (severe): Secondary | ICD-10-CM | POA: Diagnosis not present

## 2023-02-27 DIAGNOSIS — D638 Anemia in other chronic diseases classified elsewhere: Secondary | ICD-10-CM | POA: Diagnosis not present

## 2023-02-27 DIAGNOSIS — E782 Mixed hyperlipidemia: Secondary | ICD-10-CM | POA: Diagnosis not present

## 2023-02-27 DIAGNOSIS — E1122 Type 2 diabetes mellitus with diabetic chronic kidney disease: Secondary | ICD-10-CM | POA: Diagnosis not present

## 2023-03-02 DIAGNOSIS — I5022 Chronic systolic (congestive) heart failure: Secondary | ICD-10-CM | POA: Diagnosis not present

## 2023-03-02 DIAGNOSIS — N184 Chronic kidney disease, stage 4 (severe): Secondary | ICD-10-CM | POA: Diagnosis not present

## 2023-03-02 DIAGNOSIS — I25119 Atherosclerotic heart disease of native coronary artery with unspecified angina pectoris: Secondary | ICD-10-CM | POA: Diagnosis not present

## 2023-03-02 DIAGNOSIS — E1122 Type 2 diabetes mellitus with diabetic chronic kidney disease: Secondary | ICD-10-CM | POA: Diagnosis not present

## 2023-03-18 NOTE — Progress Notes (Unsigned)
Cardiology Office Note:    Date:  03/19/2023   ID:  Troy [name], DOB 04-18-1936, MRN [id number]  PCP:  Street, Stephanie Coup, MD  Cardiologist:  Norman Herrlich, MD    Referring MD: [address], Stephanie Coup, *    ASSESSMENT:    1. CAD in native artery   2. Hypertensive heart disease with heart failure (HCC)   3. CKD (chronic kidney disease) stage 4, GFR 15-29 ml/min (HCC)   4. Anemia due to stage 3b chronic kidney disease (HCC)   5. Nonrheumatic aortic valve stenosis   6. Pure hypercholesterolemia   7. Sinus bradycardia on ECG    PLAN:    In order of problems listed above:  Troy [name] is doing much better from the perspective of CAD heart failure and anemia. Having no anginal discomfort continue his medications including single antiplatelet with clopidogrel with previous GI bleeding his high intensity statin and calcium channel blocker.  He is required no nitroglycerin Stable with severe CKD Stable chronic anemia followed by hematology Good response to lipid-lowering therapy well-tolerated and continue atorvastatin Bradycardia is improved I think it is a response to hydralazine and Cardura both which can cause reflux tachycardia   Next appointment: 3 months   Medication Adjustments/Labs and Tests Ordered: Current medicines are reviewed at length with the patient today.  Concerns regarding medicines are outlined above.  Orders Placed This Encounter  Procedures   EKG 12-Lead   No orders of the defined types were placed in this encounter.    History of Present Illness:    Troy [name] is a 87 y.o. male with a hx of CAD with PCI and right coronary artery restenosis 11/12/2022 hypertensive heart disease with heart failure stage IV CKD anemia with CKD hyperlipidemia sinus bradycardia Mobitz type I AV block and aortic stenosis last seen 12/16/2022. Compliance with diet, lifestyle and medications: Yes  His wife is a retired cardiac nurse to closely supervise him  intermittently hold his antihypertensives for systolics of less than 110 He is doing dramatically better no bradycardia and blood pressure almost without exception is in the 1 30-1 40 systolic range Recent labs are reassuring 02/28/2022 LDL 63 cholesterol 123 stable CKD stage IV creatinine 2.7 GFR 23 and hemoglobin 10.7 TSH and T4 were both normal Past Medical History:  Diagnosis Date   Anemia 12/09/2015   Atherosclerosis    CAD in native artery 12/09/2015   Overview:  PCI/ stent 2001   Chronic diastolic heart failure (HCC) 12/09/2015   Chronic pancreatitis (HCC)    CKD (chronic kidney disease)    3B   Coronary artery disease involving native coronary artery of native heart with angina pectoris (HCC) 12/09/2015   Overview:  PCI/ stent 2001   Diabetes (HCC)    GERD (gastroesophageal reflux disease)    Hearing loss    Hyperlipidemia 12/09/2015   Hypertensive heart disease with heart failure (HCC) 12/09/2015   Macular degeneration    legally blind   MI (myocardial infarction) (HCC)    Necrotizing pancreatitis 06/26/2015   Prostate cancer (HCC)    radiation treatments here   Pseudocyst of pancreas 06/26/2015   PVD (peripheral vascular disease) (HCC)    Second degree heart block     Current Medications: Current Meds  Medication Sig   acetaminophen (TYLENOL) 500 MG tablet Take 500-1,000 mg by mouth every 6 (six) hours as needed for moderate pain.   ALPRAZolam (XANAX) 0.25 MG tablet Take 0.25 mg by mouth at bedtime as needed  for anxiety or sleep. 0.5 - 1 tablet 1-3 times daily prn   For anxiety   amLODipine (NORVASC) 5 MG tablet Take 1 tablet (5 mg total) by mouth in the morning and at bedtime.   atorvastatin (LIPITOR) 80 MG tablet TAKE 1 TABLET EACH EVENING (Patient taking differently: Take 80 mg by mouth daily.)   B Complex Vitamins (VITAMIN B COMPLEX) TABS Take 1 tablet by mouth daily.   Cinnamon 500 MG capsule Take 500 mg by mouth daily.   clopidogrel (PLAVIX) 75 MG tablet Take  75 mg by mouth daily.   co-enzyme Q-10 30 MG capsule Take 30 mg by mouth daily.   doxazosin (CARDURA) 2 MG tablet Take 2 mg by mouth at bedtime.    hydrALAZINE (APRESOLINE) 25 MG tablet Take 25 mg by mouth 3 (three) times daily.   insulin glargine (LANTUS) 100 UNIT/ML injection Inject 12 Units into the skin daily.   Iron Combinations (IRON COMPLEX PO) Take 65 mg by mouth every other day.   metoprolol tartrate (LOPRESSOR) 25 MG tablet Take 1 tablet (25 mg total) by mouth daily as needed (Heart rate greater than 90.).   Multiple Vitamin (MULTIVITAMIN) tablet Take 1 tablet by mouth daily.   Multiple Vitamins-Minerals (EYE VITAMINS PO) Take 1 tablet by mouth daily.   nitroGLYCERIN (NITROSTAT) 0.4 MG SL tablet Place 1 tablet (0.4 mg total) under the tongue every 5 (five) minutes as needed for chest pain.   Pancrelipase, Lip-Prot-Amyl, (CREON) 24000-76000 units CPEP Take 1 capsule by mouth in the morning, at noon, and at bedtime.   pantoprazole (PROTONIX) 40 MG tablet Take 40 mg by mouth daily.   Propylene Glycol (SYSTANE BALANCE) 0.6 % SOLN Place 1 drop into both eyes at bedtime.   vitamin C (ASCORBIC ACID) 500 MG tablet Take 500 mg by mouth daily.      EKGs/Labs/Other Studies Reviewed:    The following studies were reviewed today:  EKG Interpretation Date/Time:  Thursday March 19 2023 10:02:14 EDT Ventricular Rate:  75 PR Interval:  260 QRS Duration:  92 QT Interval:  374 QTC Calculation: 417 R Axis:   74  Text Interpretation: Sinus rhythm with 1st degree A-V block Left ventricular hypertrophy with repolarization abnormality ( Sokolow-Lyon , Romhilt-Estes ) When compared with ECG of 15-Feb-2022 07:04, No significant change was found Confirmed by Norman Herrlich ([id number]) on 03/19/2023 10:14:16 AM   Recent Labs: 04/16/2022: TSH 2.376 08/20/2022: ALT 21 12/16/2022: BUN 46; Creatinine, Ser 2.79; Hemoglobin 10.5; Platelets 266; Potassium 4.9; Sodium 138  Recent Lipid Panel    Component Value  Date/Time   CHOL 102 02/10/2022 1607   TRIG 21 02/10/2022 1607   HDL 39 (L) 02/10/2022 1607   CHOLHDL 2.6 02/10/2022 1607   VLDL 4 02/10/2022 1607   LDLCALC 59 02/10/2022 1607    Physical Exam:    VS:  BP (!) 148/72 (BP Location: Left Arm, Patient Position: Sitting)   Pulse 72   Ht 5\' 10"  (1.778 m)   Wt 159 lb 12.8 oz (72.5 kg)   SpO2 94%   BMI 22.93 kg/m     Wt Readings from Last 3 Encounters:  03/19/23 159 lb 12.8 oz (72.5 kg)  12/16/22 160 lb (72.6 kg)  11/24/22 161 lb (73 kg)     GEN: There is stronger than the last time he was present in the office well nourished, well developed in no acute distress HEENT: Normal NECK: No JVD; No carotid bruits LYMPHATICS: No lymphadenopathy CARDIAC: 2/6 murmur aortic  stenosis does not radiate to the carotids or involve S2 RRR,  RESPIRATORY:  Clear to auscultation without rales, wheezing or rhonchi  ABDOMEN: Soft, non-tender, non-distended MUSCULOSKELETAL:  No edema; No deformity  SKIN: Warm and dry NEUROLOGIC:  Alert and oriented x 3 PSYCHIATRIC:  Normal affect    Signed, Norman Herrlich, MD  03/19/2023 10:29 AM    [facility]

## 2023-03-19 ENCOUNTER — Encounter: Payer: Self-pay | Admitting: Cardiology

## 2023-03-19 ENCOUNTER — Ambulatory Visit: Payer: Medicare Other | Attending: Cardiology | Admitting: Cardiology

## 2023-03-19 VITALS — BP 148/72 | HR 72 | Ht 70.0 in | Wt 159.8 lb

## 2023-03-19 DIAGNOSIS — N1832 Chronic kidney disease, stage 3b: Secondary | ICD-10-CM | POA: Insufficient documentation

## 2023-03-19 DIAGNOSIS — E7800 Pure hypercholesterolemia, unspecified: Secondary | ICD-10-CM | POA: Insufficient documentation

## 2023-03-19 DIAGNOSIS — I350 Nonrheumatic aortic (valve) stenosis: Secondary | ICD-10-CM | POA: Diagnosis not present

## 2023-03-19 DIAGNOSIS — R001 Bradycardia, unspecified: Secondary | ICD-10-CM | POA: Insufficient documentation

## 2023-03-19 DIAGNOSIS — I2510 Atherosclerotic heart disease of native coronary artery without angina pectoris: Secondary | ICD-10-CM | POA: Insufficient documentation

## 2023-03-19 DIAGNOSIS — I110 Hypertensive heart disease with heart failure: Secondary | ICD-10-CM | POA: Diagnosis not present

## 2023-03-19 DIAGNOSIS — N184 Chronic kidney disease, stage 4 (severe): Secondary | ICD-10-CM | POA: Insufficient documentation

## 2023-03-19 DIAGNOSIS — D631 Anemia in chronic kidney disease: Secondary | ICD-10-CM | POA: Diagnosis not present

## 2023-03-19 NOTE — Patient Instructions (Signed)
Medication Instructions:  Your physician recommends that you continue on your current medications as directed. Please refer to the Current Medication list given to you today.  *If you need a refill on your cardiac medications before your next appointment, please call your pharmacy*   Lab Work: None If you have labs (blood work) drawn today and your tests are completely normal, you will receive your results only by: MyChart Message (if you have MyChart) OR A paper copy in the mail If you have any lab test that is abnormal or we need to change your treatment, we will call you to review the results.   Testing/Procedures: None   Follow-Up: At [facility], you and your health needs are our priority.  As part of our continuing mission to provide you with exceptional heart care, we have created designated Provider Care Teams.  These Care Teams include your primary Cardiologist (physician) and Advanced Practice Providers (APPs -  Physician Assistants and Nurse Practitioners) who all work together to provide you with the care you need, when you need it.  We recommend signing up for the patient portal called "MyChart".  Sign up information is provided on this After Visit Summary.  MyChart is used to connect with patients for Virtual Visits (Telemedicine).  Patients are able to view lab/test results, encounter notes, upcoming appointments, etc.  Non-urgent messages can be sent to your provider as well.   To learn more about what you can do with MyChart, go to [email or web address].    Your next appointment:   3 month(s)  Provider:   Brian Munley, MD    Other Instructions None  

## 2023-03-24 DIAGNOSIS — Z23 Encounter for immunization: Secondary | ICD-10-CM | POA: Diagnosis not present

## 2023-04-01 DIAGNOSIS — C4442 Squamous cell carcinoma of skin of scalp and neck: Secondary | ICD-10-CM | POA: Diagnosis not present

## 2023-04-01 DIAGNOSIS — D044 Carcinoma in situ of skin of scalp and neck: Secondary | ICD-10-CM | POA: Diagnosis not present

## 2023-04-01 DIAGNOSIS — L570 Actinic keratosis: Secondary | ICD-10-CM | POA: Diagnosis not present

## 2023-04-08 ENCOUNTER — Other Ambulatory Visit: Payer: Self-pay | Admitting: Cardiology

## 2023-04-08 MED ORDER — AMLODIPINE BESYLATE 5 MG PO TABS: 5.0000 mg | ORAL_TABLET | Freq: Two times a day (BID) | ORAL | 2 refills | Status: DC

## 2023-04-09 ENCOUNTER — Ambulatory Visit (INDEPENDENT_AMBULATORY_CARE_PROVIDER_SITE_OTHER): Payer: Medicare Other | Admitting: Podiatry

## 2023-04-09 DIAGNOSIS — M79674 Pain in right toe(s): Secondary | ICD-10-CM

## 2023-04-09 DIAGNOSIS — B351 Tinea unguium: Secondary | ICD-10-CM

## 2023-04-09 DIAGNOSIS — M79675 Pain in left toe(s): Secondary | ICD-10-CM | POA: Diagnosis not present

## 2023-04-09 NOTE — Progress Notes (Signed)
Subjective:  Patient ID: Troy [name], male    DOB: 01/24/1936,  MRN: [id number]  Troy [name] presents to clinic today for:  Chief Complaint  Patient presents with   Nail Problem    [facility]-nail trim   PCP- Maryjean Ka, MD Latest A1C: 6.9 (August 2024)   Patient notes nails are thick, discolored, elongated and painful in shoegear when trying to ambulate.    PCP is Street, Stephanie Coup, MD.  Allergies  Allergen Reactions   Isordil [Isosorbide Nitrate]    Review of Systems: Negative except as noted in the HPI.  Objective:  There were no vitals filed for this visit.  Troy [name] is a pleasant 87 y.o. male in NAD. AAO x 3.  Vascular Examination: Capillary refill time is 3-5 seconds to toes bilateral. Palpable pedal pulses b/l LE. Digital hair present b/l. No pedal edema b/l. Skin temperature gradient WNL b/l. No varicosities b/l. No cyanosis or clubbing noted b/l.   Dermatological Examination: Pedal skin with normal turgor, texture and tone b/l. No open wounds. No interdigital macerations b/l. Toenails x10 are 3mm thick, discolored, dystrophic with subungual debris. There is pain with compression of the nail plates.  They are elongated x10  Assessment/Plan: 1. Pain due to onychomycosis of toenails of both feet     The mycotic toenails were sharply debrided x10 with sterile nail nippers and a power debriding burr to decrease bulk/thickness and length.    Return in about 3 months (around 07/09/2023) for RFC.   Clerance Lav, DPM, FACFAS [facility]     [address] [place], [place] [address]                Office [phone]  Fax [phone]

## 2023-04-22 DIAGNOSIS — Z23 Encounter for immunization: Secondary | ICD-10-CM | POA: Diagnosis not present

## 2023-05-07 DIAGNOSIS — H353232 Exudative age-related macular degeneration, bilateral, with inactive choroidal neovascularization: Secondary | ICD-10-CM | POA: Diagnosis not present

## 2023-05-21 NOTE — Progress Notes (Signed)
Cardiology Office Note:    Date:  05/22/2023   ID:  Troy [name], DOB 23-Jan-1936, MRN [id number]  PCP:  Street, Stephanie Coup, MD   [facility] Providers Cardiologist:  Norman Herrlich, MD     Referring MD: Street, Stephanie Coup, *   CC: follow up CAD  History of Present Illness:    Troy [name] is a 87 y.o. male with a hx of CAD, HFrEF, aortic stenosis, CKD stage IV, second-degree heart block, PAD s/p right common iliac stent 2020, lung nodule, GERD, chronic pancreatitis, hyperlipidemia, DM 2, anemia due to CKD, prostate cancer, bradycardia, aortic stenosis.  11/12/2022 left heart cath 90% in-stent restenosis of proximal and midportion of previously placed RCA stents, treated with balloon angioplasty. 02/12/2022 left heart cath severe one-vessel CAD of the RCA, patent RCA stents with moderate diffuse in-stent restenosis, underwent staged PCI on 02/14/2022 with a successful PCI to RCA with placement of proximal and distal DES that overlapped the previously placed stents--indefinite DAPT Echo on 03/28/2022 revealed mild LVH, impaired relaxation, EF 55 to 60%, mild bilateral dilatation of the atrium, mild MR, moderate aortic stenosis, mild pulmonic valve regurgitation, RVSP elevated at 41 mmHg.   Mr. Lawrenz was admitted to [facility] on 11/12/2022 to 11/13/2022, with complaint of chest pain that started 4:30 in the morning, he states he woke up to use the bathroom and started having chest pain, patient denied any aggravating or relieving factors.  Initial blood pressure was elevated at 185/71, creatinine 2.73 but appear to be near his baseline, initial troponin 40 > 237.  He underwent left heart cath which revealed 90% in-stent restenosis of proximal and mid portions of previously placed RCA stents, treated with balloon angioplasty with improvement in lumen diameter to 50% proximal and 30% mid.  Nonobstructive disease in the RCA.  He did have an episode of  hypotension which resolved with holding his blood pressure medications and IV fluids.  His Marcelline Deist was held at discharge.  Stable H&H, sodium 139, potassium 4.3, creatinine 2.46 on day of discharge.  He presents today for follow-up for recent episodes of chest pain, that can occur with exertion.  He has had to take his nitroglycerin on a few occasions, and feels more exhausted than he normally does.  It looks like he was previously on isosorbide however he does not recall this, now listed as an allergy, we discussed Ranexa however his GFR is low. He denies palpitations, dyspnea, pnd, orthopnea, n, v, dizziness, syncope, edema, weight gain, or early satiety.   Past Medical History:  Diagnosis Date   Anemia 12/09/2015   Atherosclerosis    CAD in native artery 12/09/2015   Overview:  PCI/ stent 2001   Chronic diastolic heart failure (HCC) 12/09/2015   Chronic pancreatitis (HCC)    CKD (chronic kidney disease)    3B   Coronary artery disease involving native coronary artery of native heart with angina pectoris (HCC) 12/09/2015   Overview:  PCI/ stent 2001   Diabetes (HCC)    GERD (gastroesophageal reflux disease)    Hearing loss    Hyperlipidemia 12/09/2015   Hypertensive heart disease with heart failure (HCC) 12/09/2015   Macular degeneration    legally blind   MI (myocardial infarction) (HCC)    Necrotizing pancreatitis 06/26/2015   Prostate cancer (HCC)    radiation treatments here   Pseudocyst of pancreas 06/26/2015   PVD (peripheral vascular disease) (HCC)    Second degree heart block  Past Surgical History:  Procedure Laterality Date   ABDOMINAL AORTOGRAM W/LOWER EXTREMITY Bilateral 09/10/2018   Procedure: ABDOMINAL AORTOGRAM W/LOWER EXTREMITY;  Surgeon: Sherren Kerns, MD;  Location: [facility];  Service: Cardiovascular;  Laterality: Bilateral;   CHOLECYSTECTOMY     CORONARY STENT INTERVENTION N/A 02/14/2022   Procedure: CORONARY STENT INTERVENTION;  Surgeon: Yvonne Kendall, MD;  Location: [facility];  Service: Cardiovascular;  Laterality: N/A;   CORONARY ULTRASOUND/IVUS N/A 02/14/2022   Procedure: Intravascular Ultrasound/IVUS;  Surgeon: Yvonne Kendall, MD;  Location: [facility];  Service: Cardiovascular;  Laterality: N/A;   LEFT HEART CATH N/A 02/14/2022   Procedure: Left Heart Cath;  Surgeon: Yvonne Kendall, MD;  Location: [facility];  Service: Cardiovascular;  Laterality: N/A;   LEFT HEART CATH AND CORONARY ANGIOGRAPHY N/A 02/12/2022   Procedure: LEFT HEART CATH AND CORONARY ANGIOGRAPHY;  Surgeon: Iran Ouch, MD;  Location: [facility];  Service: Cardiovascular;  Laterality: N/A;   PERIPHERAL VASCULAR INTERVENTION Right 09/10/2018   Procedure: PERIPHERAL VASCULAR INTERVENTION;  Surgeon: Sherren Kerns, MD;  Location: [facility];  Service: Cardiovascular;  Laterality: Right;  common iliac   TONSILLECTOMY AND ADENOIDECTOMY     TOTAL KNEE REVISION Bilateral     Current Medications: Current Meds  Medication Sig   acetaminophen (TYLENOL) 500 MG tablet Take 500-1,000 mg by mouth every 6 (six) hours as needed for moderate pain.   ALPRAZolam (XANAX) 0.25 MG tablet Take 0.25 mg by mouth at bedtime as needed for anxiety or sleep. 0.5 - 1 tablet 1-3 times daily prn   For anxiety   amLODipine (NORVASC) 5 MG tablet Take 1 tablet (5 mg total) by mouth in the morning and at bedtime.   atorvastatin (LIPITOR) 80 MG tablet TAKE 1 TABLET EACH EVENING (Patient taking differently: Take 80 mg by mouth daily.)   B Complex Vitamins (VITAMIN B COMPLEX) TABS Take 1 tablet by mouth daily.   Cinnamon 500 MG capsule Take 500 mg by mouth daily.   clopidogrel (PLAVIX) 75 MG tablet Take 75 mg by mouth daily.   co-enzyme Q-10 30 MG capsule Take 30 mg by mouth daily.   doxazosin (CARDURA) 2 MG tablet Take 2 mg by mouth at bedtime.    hydrALAZINE (APRESOLINE) 25 MG tablet Take 25 mg by mouth 3 (three) times daily.   insulin  glargine (LANTUS) 100 UNIT/ML injection Inject 12 Units into the skin daily.   Iron Combinations (IRON COMPLEX PO) Take 65 mg by mouth every other day.   metoprolol tartrate (LOPRESSOR) 25 MG tablet Take 1 tablet (25 mg total) by mouth daily as needed (Heart rate greater than 90.).   Multiple Vitamin (MULTIVITAMIN) tablet Take 1 tablet by mouth daily.   Multiple Vitamins-Minerals (EYE VITAMINS PO) Take 1 tablet by mouth daily.   nitroGLYCERIN (NITROSTAT) 0.4 MG SL tablet Place 1 tablet (0.4 mg total) under the tongue every 5 (five) minutes as needed for chest pain.   Pancrelipase, Lip-Prot-Amyl, (CREON) 24000-76000 units CPEP Take 1 capsule by mouth in the morning, at noon, and at bedtime.   pantoprazole (PROTONIX) 40 MG tablet Take 40 mg by mouth daily.   Propylene Glycol (SYSTANE BALANCE) 0.6 % SOLN Place 1 drop into both eyes at bedtime.   vitamin C (ASCORBIC ACID) 500 MG tablet Take 500 mg by mouth daily.     Allergies:   Isordil [isosorbide nitrate]   Social History   Socioeconomic History  Marital status: Married    Spouse name: Dewayne Hatch   Number of children: 3   Years of education: 16   Highest education level: Associate degree: academic program  Occupational History   Occupation: Retired   Occupation: retired Education officer, environmental  Tobacco Use   Smoking status: Never    Passive exposure: Past   Smokeless tobacco: Never  Vaping Use   Vaping status: Never Used  Substance and Sexual Activity   Alcohol use: No   Drug use: No   Sexual activity: Not on file  Other Topics Concern   Not on file  Social History Narrative   Not on file   Social Determinants of Health   Financial Resource Strain: Low Risk  (02/14/2022)   Overall Financial Resource Strain (CARDIA)    Difficulty of Paying Living Expenses: Not hard at all  Food Insecurity: No Food Insecurity (02/14/2022)   Hunger Vital Sign    Worried About Running Out of Food in the Last Year: Never true    Ran Out of Food in the Last Year:  Never true  Transportation Needs: No Transportation Needs (02/14/2022)   PRAPARE - Administrator, Civil Service (Medical): No    Lack of Transportation (Non-Medical): No  Physical Activity: Not on file  Stress: Not on file  Social Connections: Not on file     Family History: The patient's family history includes Diabetes in his mother; Heart failure in his father and mother.  ROS:   Please see the history of present illness.    All other systems reviewed and are negative.  EKGs/Labs/Other Studies Reviewed:    The following studies were reviewed today:   [facility] 11/12/22- 1. 90% in-stent restenosis of proximal and mid portions of previously-placed RCA stents, treated with balloon angioplasty with improvement in lumen diameter to 50% (proximal) and 30% (mid). 2. Nonobstructive disease in the RCA. 3. 95 cc of contrast 4. LRA access, hemostasis with TR band. 5. No complications. Overnight patient had an hypotensive episode   EKG:  EKG is not ordered today.    Recent Labs: 08/20/2022: ALT 21 12/16/2022: BUN 46; Creatinine, Ser 2.79; Hemoglobin 10.5; Platelets 266; Potassium 4.9; Sodium 138  Recent Lipid Panel    Component Value Date/Time   CHOL 102 02/10/2022 1607   TRIG 21 02/10/2022 1607   HDL 39 (L) 02/10/2022 1607   CHOLHDL 2.6 02/10/2022 1607   VLDL 4 02/10/2022 1607   LDLCALC 59 02/10/2022 1607     Risk Assessment/Calculations:      HYPERTENSION CONTROL Vitals:   05/22/23 1447 05/22/23 1843  BP: (!) 180/72 (!) 150/80    The patient's blood pressure is elevated above target today.  In order to address the patient's elevated BP:             Physical Exam:    VS:  BP (!) 150/80   Pulse 76   Ht 5\' 10"  (1.778 m)   Wt 161 lb (73 kg)   SpO2 94%   BMI 23.10 kg/m     Wt Readings from Last 3 Encounters:  05/22/23 161 lb (73 kg)  03/19/23 159 lb 12.8 oz (72.5 kg)  12/16/22 160 lb (72.6 kg)     GEN: appears  younger than stated age, well nourished, well developed in no acute distress HEENT: Normal NECK: No JVD; No carotid bruits LYMPHATICS: No lymphadenopathy CARDIAC: RRR, 3/6 systolic murmur R/LSB, no rubs, no gallops RESPIRATORY:  Clear to auscultation without  rales, wheezing or rhonchi  ABDOMEN: Soft, non-tender, non-distended MUSCULOSKELETAL:  No edema; No deformity  SKIN: Warm and dry NEUROLOGIC:  Alert and oriented x 3 PSYCHIATRIC:  Normal affect   ASSESSMENT:    1. Aortic valve stenosis, etiology of cardiac valve disease unspecified   2. Chest pain of uncertain etiology   3. Medication management   4. Hypertensive heart disease with heart failure (HCC)   5. CKD (chronic kidney disease) stage 4, GFR 15-29 ml/min (HCC)     PLAN:    In order of problems listed above:  CAD-most recent LHC on 11/13/2022 90% in-stent restenosis of proximal and mid portions of previously placed RCA stents, treated with balloon angioplasty with improvement in lumen diameter to 50% proximal and 30% mid. Continue ASA 81 mg daily, continue Lipitor 80 mg daily, continue Plavix 75 mg daily.  He feels like he is having worsening episodes of chest pain, they are consistent with stable angina.  Will repeat an ischemic evaluation.  CKD stage IV- Careful titration of diuretics and antihypertensives.   Aortic valve stenosis-Denies SOB, syncope, dizziness, moderate on most recent echo in September 2023. Will repeat echocardiogram.   Hypertension- BP today is elevated, reviewed BP log and they are mostly controlled at home. Continue hydralazine 25 mg three times/day, continue Norvasc 5 mg daily.  He feels very poorly when his blood pressure is much less than 150 systolic, almost fatigue like he cannot function.  LDL - LDL 59 on 02/10/22, continue Lipitor 80 mg daily.  Sinus bradycardia/Mobitz type I AV block- most recent EKG revealed first degree AV block, HR 74.  He has also had issues with tachycardia at times so we  gave him a as needed dose of metoprolol previously for heart rate greater than 90.   Disposition -Lexiscan, BMET, echo. Keep follow up appt with Dr. Dulce Sellar.        Medication Adjustments/Labs and Tests Ordered: Current medicines are reviewed at length with the patient today.  Concerns regarding medicines are outlined above.  Orders Placed This Encounter  Procedures   Basic metabolic panel   MYOCARDIAL PERFUSION IMAGING   ECHOCARDIOGRAM COMPLETE   No orders of the defined types were placed in this encounter.   Patient Instructions  Medication Instructions:  Your physician recommends that you continue on your current medications as directed. Please refer to the Current Medication list given to you today.  *If you need a refill on your cardiac medications before your next appointment, please call your pharmacy*   Lab Work: Your physician recommends that you return for lab work in: Today for BMP  If you have labs (blood work) drawn today and your tests are completely normal, you will receive your results only by: MyChart Message (if you have MyChart) OR A paper copy in the mail If you have any lab test that is abnormal or we need to change your treatment, we will call you to review the results.   Testing/Procedures: Your physician has requested that you have a lexiscan myoview. For further information please visit [email or web address] Please follow instruction sheet, as given.   The test will take approximately 3 to 4 hours to complete; you may bring reading material.  If someone comes with you to your appointment, they will need to remain in the main lobby due to limited space in the testing area. How to prepare for your Myocardial Perfusion Test:             Do not take Erectile Dysfunction  medication 48 hours prior to test. Do not eat or drink 3 hours prior to your test, except you may have water. Do not consume products containing caffeine (regular or decaffeinated) 12  hours prior to your test. (ex: coffee, chocolate, sodas, tea). Do bring a list of your current medications with you.  If not listed below, you may take your medications as normal. Do wear comfortable clothes (no dresses or overalls) and walking shoes, tennis shoes preferred (No heels or open toe shoes are allowed). Do NOT wear cologne, perfume, aftershave, or lotions (deodorant is allowed). If these instructions are not followed, your test will have to be rescheduled.  Your physician has requested that you have an echocardiogram. Echocardiography is a painless test that uses sound waves to create images of your heart. It provides your doctor with information about the size and shape of your heart and how well your heart's chambers and valves are working. This procedure takes approximately one hour. There are no restrictions for this procedure. Please do NOT wear cologne, perfume, aftershave, or lotions (deodorant is allowed). Please arrive 15 minutes prior to your appointment time.     Follow-Up: At [facility], you and your health needs are our priority.  As part of our continuing mission to provide you with exceptional heart care, we have created designated Provider Care Teams.  These Care Teams include your primary Cardiologist (physician) and Advanced Practice Providers (APPs -  Physician Assistants and Nurse Practitioners) who all work together to provide you with the care you need, when you need it.  We recommend signing up for the patient portal called "MyChart".  Sign up information is provided on this After Visit Summary.  MyChart is used to connect with patients for Virtual Visits (Telemedicine).  Patients are able to view lab/test results, encounter notes, upcoming appointments, etc.  Non-urgent messages can be sent to your provider as well.   To learn more about what you can do with MyChart, go to [email or web address].    Your next appointment:  Keep appt with Dr. Dulce Sellar     Provider:   Norman Herrlich, MD    Other Instructions     Signed, Flossie Dibble, NP  05/22/2023 6:43 PM    [facility]

## 2023-05-22 ENCOUNTER — Encounter: Payer: Self-pay | Admitting: Cardiology

## 2023-05-22 ENCOUNTER — Ambulatory Visit: Payer: Medicare Other | Attending: Cardiology | Admitting: Cardiology

## 2023-05-22 VITALS — BP 150/80 | HR 76 | Ht 70.0 in | Wt 161.0 lb

## 2023-05-22 DIAGNOSIS — N184 Chronic kidney disease, stage 4 (severe): Secondary | ICD-10-CM | POA: Diagnosis present

## 2023-05-22 DIAGNOSIS — I110 Hypertensive heart disease with heart failure: Secondary | ICD-10-CM | POA: Diagnosis present

## 2023-05-22 DIAGNOSIS — Z79899 Other long term (current) drug therapy: Secondary | ICD-10-CM | POA: Diagnosis present

## 2023-05-22 DIAGNOSIS — I350 Nonrheumatic aortic (valve) stenosis: Secondary | ICD-10-CM | POA: Diagnosis present

## 2023-05-22 DIAGNOSIS — R079 Chest pain, unspecified: Secondary | ICD-10-CM | POA: Diagnosis present

## 2023-05-22 NOTE — Patient Instructions (Signed)
Medication Instructions:  Your physician recommends that you continue on your current medications as directed. Please refer to the Current Medication list given to you today.  *If you need a refill on your cardiac medications before your next appointment, please call your pharmacy*   Lab Work: Your physician recommends that you return for lab work in: Today for BMP  If you have labs (blood work) drawn today and your tests are completely normal, you will receive your results only by: MyChart Message (if you have MyChart) OR A paper copy in the mail If you have any lab test that is abnormal or we need to change your treatment, we will call you to review the results.   Testing/Procedures: Your physician has requested that you have a lexiscan myoview. For further information please visit [email or web address] Please follow instruction sheet, as given.   The test will take approximately 3 to 4 hours to complete; you may bring reading material.  If someone comes with you to your appointment, they will need to remain in the main lobby due to limited space in the testing area. How to prepare for your Myocardial Perfusion Test:             Do not take Erectile Dysfunction medication 48 hours prior to test. Do not eat or drink 3 hours prior to your test, except you may have water. Do not consume products containing caffeine (regular or decaffeinated) 12 hours prior to your test. (ex: coffee, chocolate, sodas, tea). Do bring a list of your current medications with you.  If not listed below, you may take your medications as normal. Do wear comfortable clothes (no dresses or overalls) and walking shoes, tennis shoes preferred (No heels or open toe shoes are allowed). Do NOT wear cologne, perfume, aftershave, or lotions (deodorant is allowed). If these instructions are not followed, your test will have to be rescheduled.  Your physician has requested that you have an echocardiogram. Echocardiography is  a painless test that uses sound waves to create images of your heart. It provides your doctor with information about the size and shape of your heart and how well your heart's chambers and valves are working. This procedure takes approximately one hour. There are no restrictions for this procedure. Please do NOT wear cologne, perfume, aftershave, or lotions (deodorant is allowed). Please arrive 15 minutes prior to your appointment time.     Follow-Up: At [facility], you and your health needs are our priority.  As part of our continuing mission to provide you with exceptional heart care, we have created designated Provider Care Teams.  These Care Teams include your primary Cardiologist (physician) and Advanced Practice Providers (APPs -  Physician Assistants and Nurse Practitioners) who all work together to provide you with the care you need, when you need it.  We recommend signing up for the patient portal called "MyChart".  Sign up information is provided on this After Visit Summary.  MyChart is used to connect with patients for Virtual Visits (Telemedicine).  Patients are able to view lab/test results, encounter notes, upcoming appointments, etc.  Non-urgent messages can be sent to your provider as well.   To learn more about what you can do with MyChart, go to [email or web address].    Your next appointment:  Keep appt with Dr. Dulce Sellar    Provider:   Norman Herrlich, MD    Other Instructions

## 2023-05-23 LAB — BASIC METABOLIC PANEL WITH GFR
BUN/Creatinine Ratio: 18 (ref 10–24)
BUN: 51 mg/dL — ABNORMAL HIGH (ref 8–27)
CO2: 21 mmol/L (ref 20–29)
Calcium: 9.4 mg/dL (ref 8.6–10.2)
Chloride: 103 mmol/L (ref 96–106)
Creatinine, Ser: 2.84 mg/dL — ABNORMAL HIGH (ref 0.76–1.27)
Glucose: 137 mg/dL — ABNORMAL HIGH (ref 70–99)
Potassium: 5.1 mmol/L (ref 3.5–5.2)
Sodium: 138 mmol/L (ref 134–144)
eGFR: 21 mL/min/1.73 — ABNORMAL LOW

## 2023-05-26 ENCOUNTER — Ambulatory Visit: Payer: Medicare Other

## 2023-05-27 ENCOUNTER — Telehealth (HOSPITAL_COMMUNITY): Payer: Self-pay | Admitting: *Deleted

## 2023-05-27 NOTE — Telephone Encounter (Signed)
Patient was not home spoke with his wife and gave instructions for her husband's STRESS TEST on 06/03/23 at 8:00.

## 2023-06-03 ENCOUNTER — Ambulatory Visit: Payer: Medicare Other | Attending: Cardiology

## 2023-06-03 DIAGNOSIS — I350 Nonrheumatic aortic (valve) stenosis: Secondary | ICD-10-CM | POA: Insufficient documentation

## 2023-06-03 DIAGNOSIS — Z79899 Other long term (current) drug therapy: Secondary | ICD-10-CM | POA: Insufficient documentation

## 2023-06-03 DIAGNOSIS — R079 Chest pain, unspecified: Secondary | ICD-10-CM | POA: Diagnosis present

## 2023-06-03 DIAGNOSIS — I110 Hypertensive heart disease with heart failure: Secondary | ICD-10-CM | POA: Insufficient documentation

## 2023-06-03 DIAGNOSIS — N184 Chronic kidney disease, stage 4 (severe): Secondary | ICD-10-CM | POA: Diagnosis present

## 2023-06-03 LAB — MYOCARDIAL PERFUSION IMAGING
LV dias vol: 126 mL (ref 62–150)
LV sys vol: 62 mL
Nuc Stress EF: 51 %
Peak HR: 89 {beats}/min
Rest HR: 77 {beats}/min
Rest Nuclear Isotope Dose: 8.8 mCi
SDS: 3
SRS: 12
SSS: 15
Stress Nuclear Isotope Dose: 26.1 mCi
TID: 1.08

## 2023-06-03 MED ORDER — REGADENOSON 0.4 MG/5ML IV SOLN
0.4000 mg | Freq: Once | INTRAVENOUS | Status: AC
  Administered 2023-06-03: 0.4 mg via INTRAVENOUS

## 2023-06-03 MED ORDER — TECHNETIUM TC 99M TETROFOSMIN IV KIT
8.8000 | PACK | Freq: Once | INTRAVENOUS | Status: AC | PRN
  Administered 2023-06-03: 8.8 via INTRAVENOUS

## 2023-06-03 MED ORDER — TECHNETIUM TC 99M TETROFOSMIN IV KIT
26.1000 | PACK | Freq: Once | INTRAVENOUS | Status: AC | PRN
Start: 2023-06-03 — End: 2023-06-03
  Administered 2023-06-03: 26.1 via INTRAVENOUS

## 2023-06-10 DIAGNOSIS — D638 Anemia in other chronic diseases classified elsewhere: Secondary | ICD-10-CM | POA: Diagnosis not present

## 2023-06-10 DIAGNOSIS — N184 Chronic kidney disease, stage 4 (severe): Secondary | ICD-10-CM | POA: Diagnosis not present

## 2023-06-10 DIAGNOSIS — E782 Mixed hyperlipidemia: Secondary | ICD-10-CM | POA: Diagnosis not present

## 2023-06-10 DIAGNOSIS — E1122 Type 2 diabetes mellitus with diabetic chronic kidney disease: Secondary | ICD-10-CM | POA: Diagnosis not present

## 2023-06-15 DIAGNOSIS — N184 Chronic kidney disease, stage 4 (severe): Secondary | ICD-10-CM | POA: Diagnosis not present

## 2023-06-15 DIAGNOSIS — E1122 Type 2 diabetes mellitus with diabetic chronic kidney disease: Secondary | ICD-10-CM | POA: Diagnosis not present

## 2023-06-15 DIAGNOSIS — D638 Anemia in other chronic diseases classified elsewhere: Secondary | ICD-10-CM | POA: Diagnosis not present

## 2023-06-15 DIAGNOSIS — I5022 Chronic systolic (congestive) heart failure: Secondary | ICD-10-CM | POA: Diagnosis not present

## 2023-06-16 ENCOUNTER — Ambulatory Visit: Payer: Medicare Other | Attending: Cardiology

## 2023-06-16 DIAGNOSIS — I110 Hypertensive heart disease with heart failure: Secondary | ICD-10-CM | POA: Diagnosis present

## 2023-06-16 DIAGNOSIS — Z79899 Other long term (current) drug therapy: Secondary | ICD-10-CM | POA: Insufficient documentation

## 2023-06-16 DIAGNOSIS — I350 Nonrheumatic aortic (valve) stenosis: Secondary | ICD-10-CM | POA: Diagnosis present

## 2023-06-16 DIAGNOSIS — N184 Chronic kidney disease, stage 4 (severe): Secondary | ICD-10-CM | POA: Diagnosis present

## 2023-06-16 DIAGNOSIS — R079 Chest pain, unspecified: Secondary | ICD-10-CM | POA: Diagnosis present

## 2023-06-16 LAB — ECHOCARDIOGRAM COMPLETE
AR max vel: 0.84 cm2
AV Area VTI: 0.82 cm2
AV Area mean vel: 0.86 cm2
AV Mean grad: 24.3 mmHg
AV Peak grad: 43.8 mmHg
Ao pk vel: 3.31 m/s
P 1/2 time: 351 ms
S' Lateral: 3.8 cm

## 2023-06-20 NOTE — Progress Notes (Unsigned)
Cardiology Office Note:    Date:  06/22/2023   ID:  Troy [name], DOB Dec 01, 1935, MRN [id number]  PCP:  Street, Stephanie Coup, MD  Cardiologist:  Norman Herrlich, MD    Referring MD: [address] Stephanie Coup, *    ASSESSMENT:    1. CAD in native artery   2. Hypertensive heart disease with heart failure (HCC)   3. CKD (chronic kidney disease) stage 4, GFR 15-29 ml/min (HCC)   4. Anemia due to stage 3b chronic kidney disease (HCC)   5. Nonrheumatic aortic valve stenosis    PLAN:    In order of problems listed above:  His perfusion study showed no ischemia and he has not referred for repeat coronary angiography at this time patient and his wife will continue his current medical therapy on clopidogrel single antiplatelet agent he has too much bleeding when combined with aspirin long with lipid-lowering and his antihypertensives.  Not on beta-blocker with bradycardia Heart failure is compensated currently not on loop diuretic continue antihypertensives calcium channel blocker hydralazine Stable CKD Worsened I will recheck his CBC in 2 weeks he may require repeat visit with hematology for iron infusion I am not convinced he has severe aortic stenosis and will recheck echocardiogram in 6 months   Next appointment: 3 months   Medication Adjustments/Labs and Tests Ordered: Current medicines are reviewed at length with the patient today.  Concerns regarding medicines are outlined above.  Orders Placed This Encounter  Procedures   CBC   No orders of the defined types were placed in this encounter.    History of Present Illness:    Troy [name] is a 87 y.o. male with a hx of CAD PCI and stent right coronary artery April 2024 hypertensive heart disease with heart failure and stage IV CKD anemia with his CKD hyperlipidemia sinus bradycardia Mobitz 1 second-degree AV block and aortic stenosis last seen in 03/19/2023. Compliance with diet, lifestyle and medications: Yes  He is seen  along with his wife and I admitted the discharge summary cardiac floor at the local hospital. She and carries recent labs with a hemoglobin of 9.8 platelets 300,000 06/10/2023 creatinine 2.6 GFR 24 cc potassium 4.8 iron saturation remains diminished at 13% cholesterol 108 LDL 48. Flare of angina recently stable pattern perfusion study showed no ischemia normal ejection fraction echocardiogram was read as moderate to severe aortic stenosis VTI ratio exceeded 0.25. He is now improved he said no angina in several weeks edema shortness of breath palpitation or syncope Past Medical History:  Diagnosis Date   Anemia 12/09/2015   Atherosclerosis    CAD in native artery 12/09/2015   Overview:  PCI/ stent 2001   Chronic diastolic heart failure (HCC) 12/09/2015   Chronic pancreatitis (HCC)    CKD (chronic kidney disease)    3B   Coronary artery disease involving native coronary artery of native heart with angina pectoris (HCC) 12/09/2015   Overview:  PCI/ stent 2001   Diabetes (HCC)    GERD (gastroesophageal reflux disease)    Hearing loss    Hyperlipidemia 12/09/2015   Hypertensive heart disease with heart failure (HCC) 12/09/2015   Macular degeneration    legally blind   MI (myocardial infarction) (HCC)    Necrotizing pancreatitis 06/26/2015   Prostate cancer (HCC)    radiation treatments here   Pseudocyst of pancreas 06/26/2015   PVD (peripheral vascular disease) (HCC)    Second degree heart block     Current Medications: Current Meds  Medication Sig   acetaminophen (TYLENOL) 500 MG tablet Take 500-1,000 mg by mouth every 6 (six) hours as needed for moderate pain.   ALPRAZolam (XANAX) 0.25 MG tablet Take 0.25 mg by mouth at bedtime as needed for anxiety or sleep. 0.5 - 1 tablet 1-3 times daily prn   For anxiety   amLODipine (NORVASC) 5 MG tablet Take 1 tablet (5 mg total) by mouth in the morning and at bedtime.   atorvastatin (LIPITOR) 80 MG tablet TAKE 1 TABLET EACH EVENING (Patient  taking differently: Take 80 mg by mouth daily.)   B Complex Vitamins (VITAMIN B COMPLEX) TABS Take 1 tablet by mouth daily.   Cinnamon 500 MG capsule Take 500 mg by mouth daily.   clopidogrel (PLAVIX) 75 MG tablet Take 75 mg by mouth daily.   co-enzyme Q-10 30 MG capsule Take 30 mg by mouth daily.   doxazosin (CARDURA) 2 MG tablet Take 2 mg by mouth at bedtime.    hydrALAZINE (APRESOLINE) 25 MG tablet Take 25 mg by mouth 3 (three) times daily.   insulin glargine (LANTUS) 100 UNIT/ML injection Inject 12 Units into the skin daily.   Iron Combinations (IRON COMPLEX PO) Take 65 mg by mouth every other day.   metoprolol tartrate (LOPRESSOR) 25 MG tablet Take 1 tablet (25 mg total) by mouth daily as needed (Heart rate greater than 90.).   Multiple Vitamin (MULTIVITAMIN) tablet Take 1 tablet by mouth daily.   Multiple Vitamins-Minerals (EYE VITAMINS PO) Take 1 tablet by mouth daily.   nitroGLYCERIN (NITROSTAT) 0.4 MG SL tablet Place 1 tablet (0.4 mg total) under the tongue every 5 (five) minutes as needed for chest pain.   Pancrelipase, Lip-Prot-Amyl, (CREON) 24000-76000 units CPEP Take 1 capsule by mouth in the morning, at noon, and at bedtime.   pantoprazole (PROTONIX) 40 MG tablet Take 40 mg by mouth daily.   Propylene Glycol (SYSTANE BALANCE) 0.6 % SOLN Place 1 drop into both eyes at bedtime.   vitamin C (ASCORBIC ACID) 500 MG tablet Take 500 mg by mouth daily.      EKGs/Labs/Other Studies Reviewed:    The following studies were reviewed today:  Cardiac Studies & Procedures   CARDIAC CATHETERIZATION  CARDIAC CATHETERIZATION 02/14/2022  Narrative Conclusions: Severe single-vessel coronary artery disease involving RCA stent and flanking de-novo disease, as detailed in Dr. Jari Sportsman diagnostic catheterization note from 02/12/2022. Normal left ventricular filling pressure (LVEDP 10 mmHg). Successful IVUS-guided PCI to RCA with placement of Synergy 3.5 x 12 mm (proximal) and 3.5 x 24 (distal)  drug-eluting stents that overlap the previously placed stent.  The entire stented segment was postdilated to 4.1 mm with <10% residual stenosis and TIMI-3 flow.  Recommendations: Remove right femoral artery sheath when ACT has fallen below 175 seconds with manual compression. Continue indefinite dual antiplatelet therapy with aspirin and clopidogrel. Aggressive secondary prevention. Gentle post-catheterization hydration with close monitoring of renal function.  Yvonne Kendall, MD [facility]  Findings Coronary Findings Diagnostic  Dominance: Right  Right Coronary Artery Ost RCA to Prox RCA lesion is 90% stenosed. The lesion was previously treated . Prox RCA to Mid RCA lesion is 50% stenosed. The lesion was previously treated using a stent (unknown type) . Previously placed stent displays restenosis. Mid RCA lesion is 60% stenosed.  Right Ventricular Branch RV Branch lesion is 90% stenosed.  Right Posterior Descending Artery RPDA lesion is 50% stenosed.  Right Posterior Atrioventricular Artery There is mild disease in the vessel.  Intervention  Ost RCA  to Prox RCA lesion Stent (Also treats lesions: Prox RCA to Mid RCA) A drug-eluting stent was successfully placed using a SYNERGY XD 3.50X12. Post-Intervention Lesion Assessment The intervention was successful. Pre-interventional TIMI flow is 3. Post-intervention TIMI flow is 3. No complications occurred at this lesion. There is a 0% residual stenosis post intervention.  Prox RCA to Mid RCA lesion Stent (Also treats lesions: Ost RCA to Prox RCA) See details in Ost RCA to Prox RCA lesion. Stent (Also treats lesions: Mid RCA) A drug-eluting stent was successfully placed using a SYNERGY XD 3.50X24. Post-Intervention Lesion Assessment The intervention was successful. Pre-interventional TIMI flow is 3. Post-intervention TIMI flow is 3. No complications occurred at this lesion. There is a 0% residual stenosis post  intervention.  Mid RCA lesion Stent (Also treats lesions: Prox RCA to Mid RCA) See details in Prox RCA to Mid RCA lesion. Post-Intervention Lesion Assessment The intervention was successful. Pre-interventional TIMI flow is 3. Post-intervention TIMI flow is 3. No complications occurred at this lesion. There is a 0% residual stenosis post intervention.   CARDIAC CATHETERIZATION 02/12/2022  Narrative   Mid Cx lesion is 30% stenosed.   Prox RCA lesion is 90% stenosed.   Prox RCA to Mid RCA lesion is 50% stenosed.   Mid RCA lesion is 60% stenosed.   RPDA lesion is 50% stenosed.  1.  Severe one-vessel coronary artery disease involving the right coronary artery.  Patent right coronary artery stents with moderate diffuse in-stent restenosis.  However, there is a 90% stenosis at the proximal edge of the stent with hazy appearance highly suggestive of plaque rupture.  In addition, there is a 60% distal edge stenosis. 2.  Moderate aortic stenosis with mean gradient of 23 mmHg. 3.  Moderately difficult procedure via the right radial artery due to significant tortuosity of the innominate and right subclavian arteries.  Recommendations: 35 mL of contrast was used for today's diagnostic procedure.  Gentle hydration postprocedure. Recommend staged RCA PCI on Friday.  I suspect that the whole length of the lesion within the previously placed stents will need to be treated with another drug-eluting stent. Consider femoral artery access for better support given difficulty engaging the right coronary artery from the right radial artery.  Findings Coronary Findings Diagnostic  Dominance: Right  Left Main Vessel is angiographically normal.  Left Anterior Descending There is mild diffuse disease throughout the vessel.  Second Diagonal Branch Vessel is large in size.  Left Circumflex The vessel exhibits minimal luminal irregularities. Mid Cx lesion is 30% stenosed. The lesion is mildly  calcified.  First Obtuse Marginal Branch There is mild disease in the vessel.  Third Obtuse Marginal Branch Vessel is angiographically normal.  Right Coronary Artery Prox RCA lesion is 90% stenosed. The lesion was previously treated . Prox RCA to Mid RCA lesion is 50% stenosed. The lesion was previously treated using a stent (unknown type) . Previously placed stent displays restenosis. Mid RCA lesion is 60% stenosed.  Right Posterior Descending Artery RPDA lesion is 50% stenosed.  Right Posterior Atrioventricular Artery There is mild disease in the vessel.  Intervention  No interventions have been documented.   STRESS TESTS  MYOCARDIAL PERFUSION IMAGING 06/03/2023  Narrative   Findings are consistent with infarction. The study is intermediate risk.  Patient complained of significant dyspnea during the procedure which resolved into recovery.  He gave history of chest pain resolving with nitroglycerin sublingual in the previous week of the test.   LV perfusion is abnormal. Defect  1: There is a large defect with severe reduction in uptake present in the inferior location(s) that is fixed. There is abnormal wall motion in the defect area. Consistent with infarction.   Left ventricular function is abnormal. Global function is mildly reduced. There was a single regional abnormality. Nuclear stress EF: 51%. The left ventricular ejection fraction is mildly decreased (45-54%). End diastolic cavity size is normal.   Prior study not available for comparison.   ECHOCARDIOGRAM  ECHOCARDIOGRAM COMPLETE 06/16/2023  Narrative ECHOCARDIOGRAM REPORT    Patient Name:   Troy [name] Date of Exam: 06/16/2023 Medical Rec #:  [id number]      Height:       70.0 in Accession #:    [id number]     Weight:       161.0 lb Date of Birth:  [birth year]      BSA:          1.903 m Patient Age:    87 years       BP:           150/80 mmHg Patient Gender: M              HR:           74 bpm. Exam Location:   [place]  Procedure: 2D Echo, Cardiac Doppler, Color Doppler and Strain Analysis  Indications:    Aortic valve stenosis, etiology of cardiac valve disease unspecified [I35.0 (ICD-10-CM)]; Chest pain of uncertain etiology [R07.9 (ICD-10-CM)]; Medication management [phone] (ICD-10-CM)]; Hypertensive heart disease with heart failure (HCC) [I11.0 (ICD-10-CM)]; CKD (chronic kidney disease) stage 4, GFR 15-29 ml/min (HCC) [N18.4 (ICD-10-CM)]  History:        Patient has prior history of Echocardiogram examinations, most recent 02/10/2022. LVH (left ventricular hypertrophy), CAD and Previous Myocardial Infarction, Signs/Symptoms:Hypertensive Heart Disease; Risk Factors:Dyslipidemia.  Sonographer:    Louie Boston RDCS Referring Phys: [phone] Darden Dates WOODY  IMPRESSIONS   1. Left ventricular ejection fraction, by estimation, is 55 to 60%. The left ventricle has normal function. The left ventricle has no regional wall motion abnormalities. There is mild concentric left ventricular hypertrophy. Left ventricular diastolic parameters are consistent with Grade I diastolic dysfunction (impaired relaxation). The average left ventricular global longitudinal strain is -13.1 %. The global longitudinal strain is abnormal. 2. Right ventricular systolic function is normal. The right ventricular size is normal. There is normal pulmonary artery systolic pressure. 3. Left atrial size was mildly dilated. 4. The mitral valve is degenerative. Mild to moderate mitral valve regurgitation. No evidence of mitral stenosis. 5. The aortic valve is tricuspid. There is severe calcifcation of the aortic valve. Aortic valve regurgitation is mild. Low-flow, low-gradient severe aortic stenosis with Stroke voluem 59ml. Aortic valve area, by VTI measures 0.82 cm. Aortic valve mean gradient measures 24.3 mmHg. Aortic valve Vmax measures 3.31 m/s. 6. The inferior vena cava is normal in size with greater than 50% respiratory  variability, suggesting right atrial pressure of 3 mmHg. 7. Technically difficult study with Doppler assessment of AV peak velocity likely underestimated.  Comparison(s): A prior study was performed on 02/10/2022. No obvious wall motion abnormality on this study.  Conclusion(s)/Recommendation(s): Consider further evaluation of AV with alteranate diagnostic modalities such as TEE or CT.  FINDINGS Left Ventricle: Left ventricular ejection fraction, by estimation, is 55 to 60%. The left ventricle has normal function. The left ventricle has no regional wall motion abnormalities. The average left ventricular global longitudinal strain is -13.1 %. The global longitudinal strain is abnormal.  The left ventricular internal cavity size was normal in size. There is mild concentric left ventricular hypertrophy. Left ventricular diastolic parameters are consistent with Grade I diastolic dysfunction (impaired relaxation).  Right Ventricle: The right ventricular size is normal. Right vetricular wall thickness was not well visualized. Right ventricular systolic function is normal. There is normal pulmonary artery systolic pressure. The tricuspid regurgitant velocity is 2.44 m/s, and with an assumed right atrial pressure of 3 mmHg, the estimated right ventricular systolic pressure is 26.8 mmHg.  Left Atrium: Left atrial size was mildly dilated.  Right Atrium: Right atrial size was normal in size.  Pericardium: There is no evidence of pericardial effusion.  Mitral Valve: The mitral valve is degenerative in appearance. There is mild thickening of the mitral valve leaflet(s). Mild mitral annular calcification. Mild to moderate mitral valve regurgitation. No evidence of mitral valve stenosis.  Tricuspid Valve: The tricuspid valve is not well visualized. Tricuspid valve regurgitation is mild . No evidence of tricuspid stenosis.  Aortic Valve: The aortic valve is tricuspid. There is severe calcifcation of the  aortic valve. Aortic valve regurgitation is mild. Aortic regurgitation PHT measures 351 msec. Low-flow, low-gradient severe aortic stenosis with Stroke voluem 59ml. Aortic valve mean gradient measures 24.3 mmHg. Aortic valve peak gradient measures 43.8 mmHg. Aortic valve area, by VTI measures 0.82 cm.  Pulmonic Valve: The pulmonic valve was not well visualized. Pulmonic valve regurgitation is not visualized.  Aorta: The aortic root and ascending aorta are structurally normal, with no evidence of dilitation.  Venous: The inferior vena cava is normal in size with greater than 50% respiratory variability, suggesting right atrial pressure of 3 mmHg.  IAS/Shunts: The interatrial septum was not well visualized.   LEFT VENTRICLE PLAX 2D LVIDd:         4.80 cm   Diastology LVIDs:         3.80 cm   LV e' medial:    3.94 cm/s LV PW:         1.20 cm   LV E/e' medial:  21.7 LV IVS:        1.35 cm   LV e' lateral:   10.60 cm/s LVOT diam:     2.00 cm   LV E/e' lateral: 8.1 LV SV:         58 LV SV Index:   31        2D Longitudinal Strain LVOT Area:     3.14 cm  2D Strain GLS Avg:     -13.1 %   RIGHT VENTRICLE             IVC RV Basal diam:  3.10 cm     IVC diam: 1.50 cm RV S prime:     14.90 cm/s TAPSE (M-mode): 2.7 cm  LEFT ATRIUM             Index        RIGHT ATRIUM           Index LA diam:        4.60 cm 2.42 cm/m   RA Area:     15.10 cm LA Vol (A2C):   62.5 ml 32.83 ml/m  RA Volume:   40.00 ml  21.01 ml/m LA Vol (A4C):   72.6 ml 38.14 ml/m LA Biplane Vol: 74.4 ml 39.09 ml/m AORTIC VALVE AV Area (Vmax):    0.84 cm AV Area (Vmean):   0.86 cm AV Area (VTI):     0.82 cm AV Vmax:  331.00 cm/s AV Vmean:          224.200 cm/s AV VTI:            0.705 m AV Peak Grad:      43.8 mmHg AV Mean Grad:      24.3 mmHg LVOT Vmax:         88.55 cm/s LVOT Vmean:        61.300 cm/s LVOT VTI:          0.185 m LVOT/AV VTI ratio: 0.26 AI PHT:            351 msec  AORTA Ao Root  diam: 3.00 cm Ao Asc diam:  3.00 cm  MV E velocity: 85.40 cm/s   TRICUSPID VALVE MV A velocity: 109.00 cm/s  TR Peak grad:   23.8 mmHg MV E/A ratio:  0.78         TR Vmax:        244.00 cm/s  SHUNTS Systemic VTI:  0.18 m Systemic Diam: 2.00 cm  Sreedhar reddy Madireddy Electronically signed by Vern Claude reddy Madireddy Signature Date/Time: 06/16/2023/1:02:31 PM    Final    MONITORS  LONG TERM MONITOR (3-14 DAYS) 10/02/2020  Narrative Patch Wear Time:  6 days and 21 hours (2022-02-21T09:56:40-0500 to 2022-02-28T07:44:07-498)  Patient had a min HR of 21 bpm, max HR of 111 bpm, and avg HR of 59 bpm. Predominant underlying rhythm was Sinus Rhythm. First Degree AV Block was present. 17 episode(s) of AV Block (3rd) occurred, lasting a total of 2 mins 15 secs. Second Degree AV Block-Mobitz I (Wenckebach) was present. Wenckebach was detected within +/- 45 seconds of symptomatic patient event(s). Isolated SVEs were rare (<1.0%), and no SVE Couplets or SVE Triplets were present. Isolated VEs were rare (<1.0%), VE Couplets were rare (<1.0%), and no VE Triplets were present. Difficulty discerning atrial activity at times making definitive diagnosis difficult to ascertain. MD notification criteria for Complete Heart Block and Symptomatic Bradycardia met - report posted prior to notification per account request (TY).  An event monitor was performed for 7 days to evaluate bradycardia Cardiac rhythm was sinus with average, minimum and maximum heart rates of 59 21, and 111 bpm. There were frequent episodes of Herbie Saxon and at x2-1 AV block nocturnally with heart rates in the range of 30 bpm. Ventricular ectopy overall was rare with couplets and single PVCs. Supraventricular ectopy was rare with no episodes of atrial fibrillation or flutter. The triggered and symptomatic events were predominantly sinus rhythm but frequently occurred during second-degree AV block.  Conclusion frequent episodes of  Mobitz 1 second-degree AV block particularly nocturnally at x2-1 conduction effective rate 30 bpm.               Recent Labs: 08/20/2022: ALT 21 12/16/2022: Hemoglobin 10.5; Platelets 266 05/22/2023: BUN 51; Creatinine, Ser 2.84; Potassium 5.1; Sodium 138  Recent Lipid Panel    Component Value Date/Time   CHOL 102 02/10/2022 1607   TRIG 21 02/10/2022 1607   HDL 39 (L) 02/10/2022 1607   CHOLHDL 2.6 02/10/2022 1607   VLDL 4 02/10/2022 1607   LDLCALC 59 02/10/2022 1607    Physical Exam:    VS:  BP (!) 146/62   Pulse 77   Ht 5\' 10"  (1.778 m)   Wt 161 lb (73 kg)   SpO2 98%   BMI 23.10 kg/m     Wt Readings from Last 3 Encounters:  06/22/23 161 lb (73 kg)  06/03/23 161 lb (73  kg)  05/22/23 161 lb (73 kg)     GEN: He looks pale well nourished, well developed in no acute distress HEENT: Normal NECK: No JVD; No carotid bruits LYMPHATICS: No lymphadenopathy CARDIAC: 2/6 to 3/6 murmur aortic stenosis RRR,  RESPIRATORY:  Clear to auscultation without rales, wheezing or rhonchi  ABDOMEN: Soft, non-tender, non-distended MUSCULOSKELETAL:  No edema; No deformity  SKIN: Warm and dry NEUROLOGIC:  Alert and oriented x 3 PSYCHIATRIC:  Normal affect    Signed, Norman Herrlich, MD  06/22/2023 3:23 PM    [facility]

## 2023-06-22 ENCOUNTER — Ambulatory Visit: Payer: Medicare Other | Attending: Cardiology | Admitting: Cardiology

## 2023-06-22 ENCOUNTER — Encounter: Payer: Self-pay | Admitting: Cardiology

## 2023-06-22 VITALS — BP 146/62 | HR 77 | Ht 70.0 in | Wt 161.0 lb

## 2023-06-22 DIAGNOSIS — I2510 Atherosclerotic heart disease of native coronary artery without angina pectoris: Secondary | ICD-10-CM | POA: Insufficient documentation

## 2023-06-22 DIAGNOSIS — D631 Anemia in chronic kidney disease: Secondary | ICD-10-CM | POA: Diagnosis not present

## 2023-06-22 DIAGNOSIS — I350 Nonrheumatic aortic (valve) stenosis: Secondary | ICD-10-CM | POA: Insufficient documentation

## 2023-06-22 DIAGNOSIS — N1832 Chronic kidney disease, stage 3b: Secondary | ICD-10-CM | POA: Insufficient documentation

## 2023-06-22 DIAGNOSIS — N184 Chronic kidney disease, stage 4 (severe): Secondary | ICD-10-CM | POA: Insufficient documentation

## 2023-06-22 DIAGNOSIS — I110 Hypertensive heart disease with heart failure: Secondary | ICD-10-CM | POA: Diagnosis not present

## 2023-06-22 NOTE — Patient Instructions (Addendum)
3 monthsMedication Instructions:  Your physician recommends that you continue on your current medications as directed. Please refer to the Current Medication list given to you today.  *If you need a refill on your cardiac medications before your next appointment, please call your pharmacy*   Lab Work: Your physician recommends that you return for lab work in:   Labs in 2 weeks: CBC  If you have labs (blood work) drawn today and your tests are completely normal, you will receive your results only by: MyChart Message (if you have MyChart) OR A paper copy in the mail If you have any lab test that is abnormal or we need to change your treatment, we will call you to review the results.   Testing/Procedures: None   Follow-Up: At [facility], you and your health needs are our priority.  As part of our continuing mission to provide you with exceptional heart care, we have created designated Provider Care Teams.  These Care Teams include your primary Cardiologist (physician) and Advanced Practice Providers (APPs -  Physician Assistants and Nurse Practitioners) who all work together to provide you with the care you need, when you need it.  We recommend signing up for the patient portal called "MyChart".  Sign up information is provided on this After Visit Summary.  MyChart is used to connect with patients for Virtual Visits (Telemedicine).  Patients are able to view lab/test results, encounter notes, upcoming appointments, etc.  Non-urgent messages can be sent to your provider as well.   To learn more about what you can do with MyChart, go to [email or web address].    Your next appointment:   3 month(s)  Provider:   Norman Herrlich, MD    Other Instructions None

## 2023-07-08 DIAGNOSIS — D631 Anemia in chronic kidney disease: Secondary | ICD-10-CM | POA: Diagnosis not present

## 2023-07-08 DIAGNOSIS — I350 Nonrheumatic aortic (valve) stenosis: Secondary | ICD-10-CM | POA: Diagnosis not present

## 2023-07-08 DIAGNOSIS — N184 Chronic kidney disease, stage 4 (severe): Secondary | ICD-10-CM | POA: Diagnosis not present

## 2023-07-08 DIAGNOSIS — I110 Hypertensive heart disease with heart failure: Secondary | ICD-10-CM | POA: Diagnosis not present

## 2023-07-08 DIAGNOSIS — I2510 Atherosclerotic heart disease of native coronary artery without angina pectoris: Secondary | ICD-10-CM | POA: Diagnosis not present

## 2023-07-08 DIAGNOSIS — N1832 Chronic kidney disease, stage 3b: Secondary | ICD-10-CM | POA: Diagnosis not present

## 2023-07-09 ENCOUNTER — Ambulatory Visit (INDEPENDENT_AMBULATORY_CARE_PROVIDER_SITE_OTHER): Payer: Medicare Other | Admitting: Podiatry

## 2023-07-09 DIAGNOSIS — B351 Tinea unguium: Secondary | ICD-10-CM | POA: Diagnosis not present

## 2023-07-09 DIAGNOSIS — M79675 Pain in left toe(s): Secondary | ICD-10-CM | POA: Diagnosis not present

## 2023-07-09 DIAGNOSIS — M79674 Pain in right toe(s): Secondary | ICD-10-CM

## 2023-07-09 LAB — CBC
Hematocrit: 30.1 % — ABNORMAL LOW (ref 37.5–51.0)
Hemoglobin: 9.5 g/dL — ABNORMAL LOW (ref 13.0–17.7)
MCH: 29.8 pg (ref 26.6–33.0)
MCHC: 31.6 g/dL (ref 31.5–35.7)
MCV: 94 fL (ref 79–97)
Platelets: 274 10*3/uL (ref 150–450)
RBC: 3.19 x10E6/uL — ABNORMAL LOW (ref 4.14–5.80)
RDW: 12.1 % (ref 11.6–15.4)
WBC: 8 10*3/uL (ref 3.4–10.8)

## 2023-07-09 NOTE — Progress Notes (Signed)
    Subjective:  Patient ID: [name], male    DOB: [birth year],  MRN: [id number]  TERION GROSJEAN presents to clinic today for:  Chief Complaint  Patient presents with   Diabetes    DFC A1C - 7.2 Blood Thinner   Patient notes nails are thick, discolored, elongated and painful in shoegear when trying to ambulate.  His wife is with him today.  He notes he will be having another grandchild born right after Christmas.  PCP is Street, Stephanie Coup, MD.  Allergies  Allergen Reactions   Isordil [Isosorbide Nitrate]    Review of Systems: Negative except as noted in the HPI.  Objective:  QUANTAY TRBOVICH is a pleasant 87 y.o. male in NAD. AAO x 3.  Vascular Examination: Capillary refill time is 3-5 seconds to toes bilateral. Palpable pedal pulses b/l LE. Digital hair present b/l. No pedal edema b/l. Skin temperature gradient WNL b/l. No varicosities b/l. No cyanosis or clubbing noted b/l.   Dermatological Examination: Pedal skin with normal turgor, texture and tone b/l. No open wounds. No interdigital macerations b/l. Toenails x10 are 3mm thick, discolored, dystrophic with subungual debris. There is pain with compression of the nail plates.  They are elongated x10  Assessment/Plan: 1. Pain due to onychomycosis of toenails of both feet     The mycotic toenails were sharply debrided x10 with sterile nail nippers and a power debriding burr to decrease bulk/thickness and length.    Return in about 3 months (around 10/07/2023) for [facility].   Clerance Lav, DPM, FACFAS [facility]     [address] [place], [place] [address]                Office [phone]  Fax [phone]

## 2023-07-10 ENCOUNTER — Telehealth: Payer: Self-pay | Admitting: Cardiology

## 2023-07-10 MED ORDER — AMLODIPINE BESYLATE 5 MG PO TABS: 5.0000 mg | ORAL_TABLET | Freq: Two times a day (BID) | ORAL | 0 refills | Status: DC

## 2023-07-10 NOTE — Telephone Encounter (Signed)
*  STAT* If patient is at the pharmacy, call can be transferred to refill team.   1. Which medications need to be refilled? (please list name of each medication and dose if known)   amLODipine (NORVASC) 5 MG tablet  hydrALAZINE (APRESOLINE) 25 MG tablet (Expired)   2. Would you like to learn more about the convenience, safety, & potential cost savings by using the [facility]?   3. Are you open to using the [facility] (Type [facility]. ).  4. Which pharmacy/location (including street and city if local pharmacy) is medication to be sent to?  [id number] - [place],  - [address]   5. Do they need a 30 day or 90 day supply?   90 day  Wife stated patient still has some medication left.

## 2023-07-13 ENCOUNTER — Other Ambulatory Visit: Payer: Self-pay

## 2023-07-13 ENCOUNTER — Telehealth: Payer: Self-pay | Admitting: Cardiology

## 2023-07-13 MED ORDER — HYDRALAZINE HCL 25 MG PO TABS: 25.0000 mg | ORAL_TABLET | Freq: Three times a day (TID) | ORAL | 3 refills | Status: DC

## 2023-07-13 NOTE — Telephone Encounter (Signed)
Called the patient's wife Dewayne Hatch and informed her that the patient's Hydralazine medication had been refilled. [name] verbalized understanding. She also reported that she has been unable to contact anyone in the cancer center to schedule an appointment for her husband Troy [name]. I called and left a message for them to call the patient's wife so she could schedule an appointment for her husband to see the doctor. I informed the patient that I had left a message and that they should call her soon to schedule an appointment. Patient verbalized understanding and had no further questions at this time.

## 2023-07-13 NOTE — Telephone Encounter (Signed)
Attempted to call Dewayne Hatch the patient's wife to inform her that the patient's Hydralazine medication had been refilled. She did not answer the phone and there was a busy signal on the phone.

## 2023-07-13 NOTE — Telephone Encounter (Signed)
*  STAT* If patient is at the pharmacy, call can be transferred to refill team.   1. Which medications need to be refilled? (please list name of each medication and dose if known) hydrALAZINE (APRESOLINE) 25 MG tablet (Expired)    2. Would you like to learn more about the convenience, safety, & potential cost savings by using the [facility]?    3. Are you open to using the [facility] (Type [facility]. ).   4. Which pharmacy/location (including street and city if local pharmacy) is medication to be sent to?[id number] - [place], [place] - [address]    5. Do they need a 30 day or 90 day supply? 90

## 2023-07-14 ENCOUNTER — Telehealth: Payer: Self-pay

## 2023-07-14 ENCOUNTER — Other Ambulatory Visit: Payer: Self-pay | Admitting: Hematology and Oncology

## 2023-07-14 DIAGNOSIS — N183 Chronic kidney disease, stage 3 unspecified: Secondary | ICD-10-CM

## 2023-07-14 NOTE — Telephone Encounter (Signed)
-----   Message from Adah Perl sent at 07/14/2023  8:47 AM EST ----- I can see him tomorrow with labs. He and his wife were the ones who cancelled and did not reschedule 7 months ago. ----- Message ----- From: Dyane Dustman, RN Sent: 07/14/2023   8:37 AM EST To: Adah Perl, PA-C  Richard from Dr. Hulen Shouts office called requesting a follow up for his anemia.

## 2023-07-15 ENCOUNTER — Encounter: Payer: Self-pay | Admitting: Hematology and Oncology

## 2023-07-15 ENCOUNTER — Inpatient Hospital Stay: Payer: Medicare Other | Attending: Hematology and Oncology | Admitting: Hematology and Oncology

## 2023-07-15 ENCOUNTER — Other Ambulatory Visit: Payer: Medicare Other

## 2023-07-15 VITALS — BP 158/61 | HR 88 | Temp 97.6°F | Resp 20 | Ht 70.0 in | Wt 162.0 lb

## 2023-07-15 DIAGNOSIS — N183 Chronic kidney disease, stage 3 unspecified: Secondary | ICD-10-CM | POA: Diagnosis not present

## 2023-07-15 DIAGNOSIS — D631 Anemia in chronic kidney disease: Secondary | ICD-10-CM

## 2023-07-15 DIAGNOSIS — N1832 Chronic kidney disease, stage 3b: Secondary | ICD-10-CM | POA: Diagnosis not present

## 2023-07-15 LAB — FOLATE: Folate: >40 ng/mL (ref >5.9)

## 2023-07-15 LAB — CMP (CANCER CENTER ONLY)
ALT: 24 U/L (ref 0–44)
AST: 38 U/L (ref 15–41)
Albumin: 3.8 g/dL (ref 3.5–5.0)
Alkaline Phosphatase: 175 U/L — ABNORMAL HIGH (ref 38–126)
Anion gap: 14 (ref 5–15)
BUN: 48 mg/dL — ABNORMAL HIGH (ref 8–23)
CO2: 18 mmol/L — ABNORMAL LOW (ref 22–32)
Calcium: 9.6 mg/dL (ref 8.9–10.3)
Chloride: 104 mmol/L (ref 98–111)
Creatinine: 2.52 mg/dL — ABNORMAL HIGH (ref 0.61–1.24)
GFR, Estimated: 24 mL/min — ABNORMAL LOW (ref >60)
Glucose, Bld: 184 mg/dL — ABNORMAL HIGH (ref 70–99)
Potassium: 4.6 mmol/L (ref 3.5–5.1)
Sodium: 136 mmol/L (ref 135–145)
Total Bilirubin: 0.4 mg/dL (ref <1.2)
Total Protein: 7.1 g/dL (ref 6.5–8.1)

## 2023-07-15 LAB — IRON AND TIBC
Iron: 86 ug/dL (ref 45–182)
Saturation Ratios: 30 % (ref 17.9–39.5)
TIBC: 287 ug/dL (ref 250–450)
UIBC: 201 ug/dL

## 2023-07-15 LAB — CBC WITH DIFFERENTIAL (CANCER CENTER ONLY)
Abs Immature Granulocytes: 0.03 10*3/uL (ref 0.00–0.07)
Basophils Absolute: 0.1 10*3/uL (ref 0.0–0.1)
Basophils Relative: 1 %
Eosinophils Absolute: 0.2 10*3/uL (ref 0.0–0.5)
Eosinophils Relative: 3 %
HCT: 30.7 % — ABNORMAL LOW (ref 39.0–52.0)
Hemoglobin: 9.9 g/dL — ABNORMAL LOW (ref 13.0–17.0)
Immature Granulocytes: 0 %
Lymphocytes Relative: 19 %
Lymphs Abs: 1.4 10*3/uL (ref 0.7–4.0)
MCH: 28.8 pg (ref 26.0–34.0)
MCHC: 32.2 g/dL (ref 30.0–36.0)
MCV: 89.2 fL (ref 80.0–100.0)
Monocytes Absolute: 0.7 10*3/uL (ref 0.1–1.0)
Monocytes Relative: 10 %
Neutro Abs: 5 10*3/uL (ref 1.7–7.7)
Neutrophils Relative %: 67 %
Platelet Count: 258 10*3/uL (ref 150–400)
RBC: 3.44 MIL/uL — ABNORMAL LOW (ref 4.22–5.81)
RDW: 13.3 % (ref 11.5–15.5)
WBC Count: 7.4 10*3/uL (ref 4.0–10.5)
nRBC: 0 % (ref 0.0–0.2)
nRBC: 0 /100{WBCs}

## 2023-07-15 LAB — FERRITIN: Ferritin: 142 ng/mL (ref 24–336)

## 2023-07-15 LAB — VITAMIN B12: Vitamin B-12: 701 pg/mL (ref 180–914)

## 2023-07-15 NOTE — Progress Notes (Cosign Needed Addendum)
[facility]  [address] [place],  [place]  [address] [phone]  Clinic Day:  07/15/2023  Referring physician: Casper Harrison, Stephanie Coup, *   CHIEF COMPLAINT:  CC: Anemia secondary to chronic kidney disease  Current Treatment:  Epoetin  HISTORY OF PRESENT ILLNESS:  Troy [name] is a 87 y.o. male with a history of anemia secondary to chronic kidney disease.  He was placed on epoetin 20,000 international units monthly in October 2023 with a good response. Epoetin intermittently with his last injection being in February 2024. He decided not to follow-up after that. He is referred back by Dr. Dulce Sellar as his hemoglobin was 9.5 on December 11.  The patient denies progressive fatigue concerning for worsening anemia.  He states he bleeds easily when cut.  He otherwise denies any overt form of blood loss.   He continues on clopidogrel, but is not on aspirin.  He had recurrent chest pain and shortness of breath, so underwent balloon angioplasty in April with improvement in his symptoms. Of late, he has had more episodes of chest pain and shortness of breath relieved with nitroglycerin.  REVIEW OF SYSTEMS:  Review of Systems  Constitutional:  Negative for appetite change, chills, fatigue, fever and unexpected weight change.  HENT:   Negative for lump/mass, mouth sores and sore throat.   Respiratory:  Positive for chest tightness (occasional/ with exertion) and shortness of breath (occasional/with exertion). Negative for cough.   Cardiovascular:  Negative for chest pain and leg swelling.  Gastrointestinal:  Negative for abdominal pain, constipation, diarrhea, nausea and vomiting.  Genitourinary:  Negative for difficulty urinating, dysuria, frequency and hematuria.   Musculoskeletal:  Negative for arthralgias, back pain and myalgias.  Skin:  Negative for itching, rash and wound.  Neurological:  Negative for dizziness, extremity weakness, headaches, light-headedness and numbness.   Hematological:  Negative for adenopathy. Bruises/bleeds easily (longer when cut).  Psychiatric/Behavioral:  Negative for depression and sleep disturbance. The patient is not nervous/anxious.     VITALS:  Blood pressure (!) 158/61, pulse 88, temperature 97.6 F (36.4 C), temperature source Oral, resp. rate 20, height 5\' 10"  (1.778 m), weight 162 lb (73.5 kg), SpO2 99%.  Wt Readings from Last 3 Encounters:  07/15/23 162 lb (73.5 kg)  06/22/23 161 lb (73 kg)  06/03/23 161 lb (73 kg)    Body mass index is 23.24 kg/m.  Performance status (ECOG): 1 - Symptomatic but completely ambulatory  PHYSICAL EXAM:  Physical Exam Vitals and nursing note reviewed. Exam conducted with a chaperone present.  Constitutional:      General: He is not in acute distress.    Appearance: Normal appearance. He is normal weight.  HENT:     Head: Normocephalic and atraumatic.     Mouth/Throat:     Mouth: Mucous membranes are moist.     Pharynx: Oropharynx is clear. No oropharyngeal exudate or posterior oropharyngeal erythema.  Eyes:     General: No scleral icterus.    Extraocular Movements: Extraocular movements intact.     Conjunctiva/sclera: Conjunctivae normal.     Pupils: Pupils are equal, round, and reactive to light.  Cardiovascular:     Rate and Rhythm: Normal rate and regular rhythm.     Heart sounds: Murmur heard.     Systolic murmur is present with a grade of 3/6.     No friction rub. No gallop.  Pulmonary:     Effort: Pulmonary effort is normal.     Breath sounds: Normal  breath sounds. No wheezing, rhonchi or rales.  Abdominal:     General: Bowel sounds are normal. There is no distension.     Palpations: Abdomen is soft. There is no hepatomegaly, splenomegaly or mass.     Tenderness: There is no abdominal tenderness.  Musculoskeletal:        General: Normal range of motion.     Cervical back: Normal range of motion and neck supple. No tenderness.     Right lower leg: No edema.     Left  lower leg: No edema.  Lymphadenopathy:     Cervical: No cervical adenopathy.     Upper Body:     Right upper body: No supraclavicular or axillary adenopathy.     Left upper body: No supraclavicular or axillary adenopathy.     Lower Body: No right inguinal adenopathy. No left inguinal adenopathy.  Skin:    General: Skin is warm and dry.     Coloration: Skin is not jaundiced.     Findings: No rash.  Neurological:     General: No focal deficit present.     Mental Status: He is alert and oriented to person, place, and time. Mental status is at baseline.     Cranial Nerves: No cranial nerve deficit.  Psychiatric:        Mood and Affect: Mood normal.        Behavior: Behavior normal.        Thought Content: Thought content normal.        Judgment: Judgment normal.    LABS:      Latest Ref Rng & Units 07/15/2023   10:10 AM 07/08/2023    9:15 AM 12/16/2022    9:26 AM  CBC  WBC 4.0 - 10.5 K/uL 7.4  8.0  8.2   Hemoglobin 13.0 - 17.0 g/dL 9.9  9.5  08.6   Hematocrit 39.0 - 52.0 % 30.7  30.1  32.0   Platelets 150 - 400 K/uL 258  274  266       Latest Ref Rng & Units 07/15/2023   10:10 AM 05/22/2023    3:52 PM 12/16/2022    9:26 AM  CMP  Glucose 70 - 99 mg/dL 578  469  629   BUN 8 - 23 mg/dL 48  51  46   Creatinine 0.61 - 1.24 mg/dL 5.28  4.13  2.44   Sodium 135 - 145 mmol/L 136  138  138   Potassium 3.5 - 5.1 mmol/L 4.6  5.1  4.9   Chloride 98 - 111 mmol/L 104  103  102   CO2 22 - 32 mmol/L 18  21  22    Calcium 8.9 - 10.3 mg/dL 9.6  9.4  9.7   Total Protein 6.5 - 8.1 g/dL 7.1     Total Bilirubin <1.2 mg/dL 0.4     Alkaline Phos 38 - 126 U/L 175     AST 15 - 41 U/L 38     ALT 0 - 44 U/L 24       Lab Results  Component Value Date   TOTALPROTELP 6.5 03/27/2022   ALBUMINELP 3.4 03/27/2022   A1GS 0.2 03/27/2022   A2GS 0.6 03/27/2022   BETS 0.8 03/27/2022   GAMS 1.4 03/27/2022   MSPIKE Not Observed 03/27/2022   SPEI Comment 03/27/2022   Lab Results  Component Value  Date   TIBC 258 08/20/2022   TIBC 256 04/16/2022   TIBC 272 03/27/2022   FERRITIN 109 08/20/2022  FERRITIN 125 04/16/2022   FERRITIN 103 03/27/2022   IRONPCTSAT 24 08/20/2022   IRONPCTSAT 24 04/16/2022   IRONPCTSAT 19 03/27/2022   Lab Results  Component Value Date   LDH 201 (H) 03/27/2022    STUDIES:  ECHOCARDIOGRAM COMPLETE Result Date: 06/16/2023    ECHOCARDIOGRAM REPORT   Patient Name:   Troy [name] Date of Exam: 06/16/2023 Medical Rec #:  [id number]      Height:       70.0 in Accession #:    [id number]     Weight:       161.0 lb Date of Birth:  [birth year]      BSA:          1.903 m Patient Age:    87 years       BP:           150/80 mmHg Patient Gender: M              HR:           74 bpm. Exam Location:  [place] Procedure: 2D Echo, Cardiac Doppler, Color Doppler and Strain Analysis Indications:    Aortic valve stenosis, etiology of cardiac valve disease                 unspecified [I35.0 (ICD-10-CM)]; Chest pain of uncertain                 etiology [R07.9 (ICD-10-CM)]; Medication management [phone]                 (ICD-10-CM)]; Hypertensive heart disease with heart failure                 (HCC) [I11.0 (ICD-10-CM)]; CKD (chronic kidney disease) stage 4,                 GFR 15-29 ml/min (HCC) [N18.4 (ICD-10-CM)]  History:        Patient has prior history of Echocardiogram examinations, most                 recent 02/10/2022. LVH (left ventricular hypertrophy), CAD and                 Previous Myocardial Infarction, Signs/Symptoms:Hypertensive                 Heart Disease; Risk Factors:Dyslipidemia.  Sonographer:    Louie Boston RDCS Referring Phys: [phone] Darden Dates WOODY IMPRESSIONS  1. Left ventricular ejection fraction, by estimation, is 55 to 60%. The left ventricle has normal function. The left ventricle has no regional wall motion abnormalities. There is mild concentric left ventricular hypertrophy. Left ventricular diastolic parameters are consistent with Grade I diastolic dysfunction  (impaired relaxation). The average left ventricular global longitudinal strain is -13.1 %. The global longitudinal strain is abnormal.  2. Right ventricular systolic function is normal. The right ventricular size is normal. There is normal pulmonary artery systolic pressure.  3. Left atrial size was mildly dilated.  4. The mitral valve is degenerative. Mild to moderate mitral valve regurgitation. No evidence of mitral stenosis.  5. The aortic valve is tricuspid. There is severe calcifcation of the aortic valve. Aortic valve regurgitation is mild. Low-flow, low-gradient severe aortic stenosis with Stroke voluem 59ml. Aortic valve area, by VTI measures 0.82 cm. Aortic valve mean  gradient measures 24.3 mmHg. Aortic valve Vmax measures 3.31 m/s.  6. The inferior vena cava is normal in size with greater than 50% respiratory variability, suggesting right atrial pressure of  3 mmHg.  7. Technically difficult study with Doppler assessment of AV peak velocity likely underestimated. Comparison(s): A prior study was performed on 02/10/2022. No obvious wall motion abnormality on this study. Conclusion(s)/Recommendation(s): Consider further evaluation of AV with alteranate diagnostic modalities such as TEE or CT. FINDINGS  Left Ventricle: Left ventricular ejection fraction, by estimation, is 55 to 60%. The left ventricle has normal function. The left ventricle has no regional wall motion abnormalities. The average left ventricular global longitudinal strain is -13.1 %. The global longitudinal strain is abnormal. The left ventricular internal cavity size was normal in size. There is mild concentric left ventricular hypertrophy. Left ventricular diastolic parameters are consistent with Grade I diastolic dysfunction (impaired relaxation). Right Ventricle: The right ventricular size is normal. Right vetricular wall thickness was not well visualized. Right ventricular systolic function is normal. There is normal pulmonary artery  systolic pressure. The tricuspid regurgitant velocity is 2.44 m/s, and with an assumed right atrial pressure of 3 mmHg, the estimated right ventricular systolic pressure is 26.8 mmHg. Left Atrium: Left atrial size was mildly dilated. Right Atrium: Right atrial size was normal in size. Pericardium: There is no evidence of pericardial effusion. Mitral Valve: The mitral valve is degenerative in appearance. There is mild thickening of the mitral valve leaflet(s). Mild mitral annular calcification. Mild to moderate mitral valve regurgitation. No evidence of mitral valve stenosis. Tricuspid Valve: The tricuspid valve is not well visualized. Tricuspid valve regurgitation is mild . No evidence of tricuspid stenosis. Aortic Valve: The aortic valve is tricuspid. There is severe calcifcation of the aortic valve. Aortic valve regurgitation is mild. Aortic regurgitation PHT measures 351 msec. Low-flow, low-gradient severe aortic stenosis with Stroke voluem 59ml. Aortic valve mean gradient measures 24.3 mmHg. Aortic valve peak gradient measures 43.8 mmHg. Aortic valve area, by VTI measures 0.82 cm. Pulmonic Valve: The pulmonic valve was not well visualized. Pulmonic valve regurgitation is not visualized. Aorta: The aortic root and ascending aorta are structurally normal, with no evidence of dilitation. Venous: The inferior vena cava is normal in size with greater than 50% respiratory variability, suggesting right atrial pressure of 3 mmHg. IAS/Shunts: The interatrial septum was not well visualized.  LEFT VENTRICLE PLAX 2D LVIDd:         4.80 cm   Diastology LVIDs:         3.80 cm   LV e' medial:    3.94 cm/s LV PW:         1.20 cm   LV E/e' medial:  21.7 LV IVS:        1.35 cm   LV e' lateral:   10.60 cm/s LVOT diam:     2.00 cm   LV E/e' lateral: 8.1 LV SV:         58 LV SV Index:   31        2D Longitudinal Strain LVOT Area:     3.14 cm  2D Strain GLS Avg:     -13.1 %  RIGHT VENTRICLE             IVC RV Basal diam:  3.10 cm      IVC diam: 1.50 cm RV S prime:     14.90 cm/s TAPSE (M-mode): 2.7 cm LEFT ATRIUM             Index        RIGHT ATRIUM           Index LA diam:  4.60 cm 2.42 cm/m   RA Area:     15.10 cm LA Vol (A2C):   62.5 ml 32.83 ml/m  RA Volume:   40.00 ml  21.01 ml/m LA Vol (A4C):   72.6 ml 38.14 ml/m LA Biplane Vol: 74.4 ml 39.09 ml/m  AORTIC VALVE AV Area (Vmax):    0.84 cm AV Area (Vmean):   0.86 cm AV Area (VTI):     0.82 cm AV Vmax:           331.00 cm/s AV Vmean:          224.200 cm/s AV VTI:            0.705 m AV Peak Grad:      43.8 mmHg AV Mean Grad:      24.3 mmHg LVOT Vmax:         88.55 cm/s LVOT Vmean:        61.300 cm/s LVOT VTI:          0.185 m LVOT/AV VTI ratio: 0.26 AI PHT:            351 msec  AORTA Ao Root diam: 3.00 cm Ao Asc diam:  3.00 cm MV E velocity: 85.40 cm/s   TRICUSPID VALVE MV A velocity: 109.00 cm/s  TR Peak grad:   23.8 mmHg MV E/A ratio:  0.78         TR Vmax:        244.00 cm/s                              SHUNTS                             Systemic VTI:  0.18 m                             Systemic Diam: 2.00 cm Sreedhar reddy Madireddy Electronically signed by Vern Claude reddy Madireddy Signature Date/Time: 06/16/2023/1:02:31 PM    Final       HISTORY:   Past Medical History:  Diagnosis Date   Anemia 12/09/2015   Atherosclerosis    CAD in native artery 12/09/2015   Overview:  PCI/ stent 2001   Chronic diastolic heart failure (HCC) 12/09/2015   Chronic pancreatitis (HCC)    CKD (chronic kidney disease)    3B   Coronary artery disease involving native coronary artery of native heart with angina pectoris (HCC) 12/09/2015   Overview:  PCI/ stent 2001   Diabetes (HCC)    GERD (gastroesophageal reflux disease)    Hearing loss    Hyperlipidemia 12/09/2015   Hypertensive heart disease with heart failure (HCC) 12/09/2015   Macular degeneration    legally blind   MI (myocardial infarction) (HCC)    Necrotizing pancreatitis 06/26/2015   Prostate cancer (HCC)     radiation treatments here   Pseudocyst of pancreas 06/26/2015   PVD (peripheral vascular disease) (HCC)    Second degree heart block     Past Surgical History:  Procedure Laterality Date   ABDOMINAL AORTOGRAM W/LOWER EXTREMITY Bilateral 09/10/2018   Procedure: ABDOMINAL AORTOGRAM W/LOWER EXTREMITY;  Surgeon: Sherren Kerns, MD;  Location: [facility];  Service: Cardiovascular;  Laterality: Bilateral;   cardiac baloon surgery     11/12/2022   CHOLECYSTECTOMY     CORONARY STENT INTERVENTION N/A 02/14/2022  Procedure: CORONARY STENT INTERVENTION;  Surgeon: Yvonne Kendall, MD;  Location: [facility];  Service: Cardiovascular;  Laterality: N/A;   CORONARY ULTRASOUND/IVUS N/A 02/14/2022   Procedure: Intravascular Ultrasound/IVUS;  Surgeon: Yvonne Kendall, MD;  Location: [facility];  Service: Cardiovascular;  Laterality: N/A;   LEFT HEART CATH N/A 02/14/2022   Procedure: Left Heart Cath;  Surgeon: Yvonne Kendall, MD;  Location: [facility];  Service: Cardiovascular;  Laterality: N/A;   LEFT HEART CATH AND CORONARY ANGIOGRAPHY N/A 02/12/2022   Procedure: LEFT HEART CATH AND CORONARY ANGIOGRAPHY;  Surgeon: Iran Ouch, MD;  Location: [facility];  Service: Cardiovascular;  Laterality: N/A;   PERIPHERAL VASCULAR INTERVENTION Right 09/10/2018   Procedure: PERIPHERAL VASCULAR INTERVENTION;  Surgeon: Sherren Kerns, MD;  Location: [facility];  Service: Cardiovascular;  Laterality: Right;  common iliac   TONSILLECTOMY AND ADENOIDECTOMY     TOTAL KNEE REVISION Bilateral     Family History  Problem Relation Age of Onset   Heart failure Mother    Diabetes Mother    Heart failure Father     Social History:  reports that he has never smoked. He has been exposed to tobacco smoke. He has never used smokeless tobacco. He reports that he does not drink alcohol and does not use drugs.The patient is accompanied by his wife today.  Allergies:   Allergies  Allergen Reactions   Isordil [Isosorbide Nitrate]     Current Medications: Current Outpatient Medications  Medication Sig Dispense Refill   acetaminophen (TYLENOL) 500 MG tablet Take 500-1,000 mg by mouth every 6 (six) hours as needed for moderate pain.     ALPRAZolam (XANAX) 0.25 MG tablet Take 0.25 mg by mouth at bedtime as needed for anxiety or sleep. 0.5 - 1 tablet 1-3 times daily prn   For anxiety     amLODipine (NORVASC) 5 MG tablet Take 1 tablet (5 mg total) by mouth in the morning and at bedtime. 180 tablet 0   atorvastatin (LIPITOR) 80 MG tablet TAKE 1 TABLET EACH EVENING (Patient taking differently: Take 80 mg by mouth daily.) 90 tablet 1   B Complex Vitamins (VITAMIN B COMPLEX) TABS Take 1 tablet by mouth daily.     Cinnamon 500 MG capsule Take 500 mg by mouth daily.     clopidogrel (PLAVIX) 75 MG tablet Take 75 mg by mouth daily.     co-enzyme Q-10 30 MG capsule Take 30 mg by mouth daily.     doxazosin (CARDURA) 2 MG tablet Take 2 mg by mouth at bedtime.      hydrALAZINE (APRESOLINE) 25 MG tablet Take 1 tablet (25 mg total) by mouth 3 (three) times daily. 270 tablet 3   insulin glargine (LANTUS) 100 UNIT/ML injection Inject 12 Units into the skin daily.     Iron Combinations (IRON COMPLEX PO) Take 65 mg by mouth every other day.     metoprolol tartrate (LOPRESSOR) 25 MG tablet Take 1 tablet (25 mg total) by mouth daily as needed (Heart rate greater than 90.). 90 tablet 3   Multiple Vitamins-Minerals (EYE VITAMINS PO) Take 1 tablet by mouth daily.     nitroGLYCERIN (NITROSTAT) 0.4 MG SL tablet Place 1 tablet (0.4 mg total) under the tongue every 5 (five) minutes as needed for chest pain. 25 tablet 10   Pancrelipase, Lip-Prot-Amyl, (CREON) 24000-76000 units CPEP Take 1 capsule by mouth in the morning, at noon, and at bedtime.     pantoprazole (  PROTONIX) 40 MG tablet Take 40 mg by mouth daily.     Propylene Glycol (SYSTANE BALANCE) 0.6 % SOLN Place 1 drop into both eyes  at bedtime.     vitamin C (ASCORBIC ACID) 500 MG tablet Take 500 mg by mouth daily.     No current facility-administered medications for this visit.     ASSESSMENT & PLAN:   Assessment & Plan: DAVELL HAFEZ is a 87 y.o. male with anemia secondary to chronic kidney disease. He is referred back for consideration of resuming epoetin.  His hemoglobin remains low at 9.9.  Iron studies, B12 and folate are normal today.  We will resume epoetin every 4 weeks with a CBC. I will plan to see him back in 4 weeks with repeat clinical assessment.  The patient and his wife understand the plans discussed today and are in agreement with them.  They know to contact our office if he develops concerns prior to his next appointment.       Adah Perl, PA-C   [facility] [place] - A DEPT OF [facility] [address] [place] [address] Dept: [phone] Dept Fax: [phone]   No orders of the defined types were placed in this encounter.   I,Jasmine M Lassiter,acting as a Neurosurgeon for [employer], PA-C.,have documented all relevant documentation on the behalf of Adah Perl, PA-C,as directed by  Adah Perl, PA-C while in the presence of [employer], PA-C.

## 2023-07-16 DIAGNOSIS — H353221 Exudative age-related macular degeneration, left eye, with active choroidal neovascularization: Secondary | ICD-10-CM | POA: Diagnosis not present

## 2023-07-16 DIAGNOSIS — H353212 Exudative age-related macular degeneration, right eye, with inactive choroidal neovascularization: Secondary | ICD-10-CM | POA: Diagnosis not present

## 2023-07-17 ENCOUNTER — Telehealth: Payer: Self-pay | Admitting: Hematology and Oncology

## 2023-07-17 NOTE — Telephone Encounter (Signed)
Contacted pt to schedule an appt. Unable to reach via phone, voicemail was left.    Scheduling Message Entered by Belva Crome A on 07/15/2023 at  4:24 PM Priority: High <No visit type provided>  Department: [facility]  Provider:  Scheduling Notes:  1. Please schedule retacrit next available  2. Labs and f/u with Kelli in 4 weeks

## 2023-07-20 ENCOUNTER — Inpatient Hospital Stay: Payer: Medicare Other

## 2023-07-20 VITALS — BP 157/54 | HR 84 | Temp 98.0°F | Resp 20

## 2023-07-20 DIAGNOSIS — N183 Anemia in chronic kidney disease: Secondary | ICD-10-CM

## 2023-07-20 DIAGNOSIS — D631 Anemia in chronic kidney disease: Secondary | ICD-10-CM | POA: Diagnosis not present

## 2023-07-20 DIAGNOSIS — N1832 Chronic kidney disease, stage 3b: Secondary | ICD-10-CM | POA: Diagnosis not present

## 2023-07-20 MED ORDER — EPOETIN ALFA-EPBX 20000 UNIT/ML IJ SOLN
20000.0000 [IU] | Freq: Once | INTRAMUSCULAR | Status: AC
Start: 2023-07-20 — End: 2023-07-20
  Administered 2023-07-20: 20000 [IU] via SUBCUTANEOUS
  Filled 2023-07-20: qty 1

## 2023-07-20 NOTE — Patient Instructions (Signed)

## 2023-07-30 ENCOUNTER — Other Ambulatory Visit: Payer: Self-pay

## 2023-07-30 DIAGNOSIS — Z5986 Financial insecurity: Secondary | ICD-10-CM

## 2023-07-30 NOTE — Progress Notes (Signed)
   07/30/2023  Patient ID: Troy [name], male   DOB: Jun 22, 1936, 88 y.o.   MRN: [id number]    2025 Medication Assistance Renewal Application Summary:  Patient was outreached regarding medication assistance renewal for 2025. Verified address, anticipated insurance for 2025, and income has not changed. Patient remains interested in PAP for 2025 for Basaglar , please see below for other medications identified for medication assistance. Informed patient it may take longer than usual for application process and they will need to fill medications with insurance coverage.    Medication Review Findings:  Basaglar  - Patient received PAP Creon  - Patient's wife reports Mr. Troy [name] received medication assistance for this Troy [name] - Patient's wife reports Mr. Troy [name] received medication assistance for this  Medication Assistance Findings:  No medication assistance needs identified     Additional medication assistance options reviewed with patient as warranted:  No other options identified  Plan: I will route patient assistance letter to pharmacy technician who will coordinate patient assistance program application process for medications listed above.  Pharmacy technician will assist with obtaining all required documents from both patient and provider(s) and submit application(s) once completed.    Thank you for allowing pharmacy to be a part of this patient's care.  Troy [name], PharmD Clinical Pharmacist Cell: [phone]

## 2023-08-06 DIAGNOSIS — H43813 Vitreous degeneration, bilateral: Secondary | ICD-10-CM | POA: Diagnosis not present

## 2023-08-06 DIAGNOSIS — H353212 Exudative age-related macular degeneration, right eye, with inactive choroidal neovascularization: Secondary | ICD-10-CM | POA: Diagnosis not present

## 2023-08-06 DIAGNOSIS — H353221 Exudative age-related macular degeneration, left eye, with active choroidal neovascularization: Secondary | ICD-10-CM | POA: Diagnosis not present

## 2023-08-10 ENCOUNTER — Telehealth: Payer: Self-pay | Admitting: Pharmacy Technician

## 2023-08-10 DIAGNOSIS — Z5986 Financial insecurity: Secondary | ICD-10-CM

## 2023-08-10 NOTE — Progress Notes (Signed)
 Pharmacy Medication Assistance Program Note    08/10/2023  Patient ID: Troy [name], male   DOB: 1936-07-23, 88 y.o.   MRN: [id number]     08/10/2023  Outreach Medication One  Initial Outreach Date (Medication One) 08/06/2023  Manufacturer Medication One [employer] [facility] Creon   Dose of Creon  24,000 units  Type of Assistance Manufacturer Assistance  Date Application Sent to Patient 08/12/2023  Application Items Requested Application;Proof of Income;Other  Date Application Sent to Prescriber 08/12/2023  Name of Prescriber Lonni Rubens       08/10/2023  Outreach Medication Two  Initial Outreach Date (Medication Two) 08/06/2023  Manufacturer Medication Two Lilly  [employer] Basaglar   Dose of Basaglar  KwikPen 100 units/ml  Type of Assistance Manufacturer Assistance  Date Application Sent to Patient 08/12/2023  Application Items Requested Application;Proof of Income;Other  Date Application Sent to Prescriber 08/12/2023  Name of Prescriber [address]       08/10/2023  Outreach Medication Three  Initial Outreach Date (Medication Three) 08/06/2023  Manufacturer Medication Three [employer]  [employer] [facility] Farxiga  Dose of Farxiga 10mg   Type of Radiographer, Therapeutic Assistance  Date Application Sent to Patient 08/12/2023  Application Items Requested Application;Proof of Income;Other  Date Application Sent to Prescriber 08/12/2023  Name of Prescriber [address]     SIGNATURE  Kate Marzette Sola [facility]  Office: [phone] Fax: [phone] Email: [email or web address]

## 2023-08-17 ENCOUNTER — Inpatient Hospital Stay: Payer: Medicare Other

## 2023-08-17 ENCOUNTER — Other Ambulatory Visit: Payer: Self-pay | Admitting: Hematology and Oncology

## 2023-08-17 ENCOUNTER — Inpatient Hospital Stay: Payer: Medicare Other | Attending: Hematology and Oncology | Admitting: Hematology and Oncology

## 2023-08-17 ENCOUNTER — Encounter: Payer: Self-pay | Admitting: Hematology and Oncology

## 2023-08-17 VITALS — BP 160/60 | HR 58 | Temp 98.0°F | Resp 18 | Ht 70.0 in | Wt 158.0 lb

## 2023-08-17 DIAGNOSIS — I252 Old myocardial infarction: Secondary | ICD-10-CM | POA: Diagnosis not present

## 2023-08-17 DIAGNOSIS — N1832 Chronic kidney disease, stage 3b: Secondary | ICD-10-CM | POA: Insufficient documentation

## 2023-08-17 DIAGNOSIS — D631 Anemia in chronic kidney disease: Secondary | ICD-10-CM | POA: Insufficient documentation

## 2023-08-17 DIAGNOSIS — N183 Chronic kidney disease, stage 3 unspecified: Secondary | ICD-10-CM

## 2023-08-17 LAB — CBC WITH DIFFERENTIAL (CANCER CENTER ONLY)
Abs Immature Granulocytes: 0.04 10*3/uL (ref 0.00–0.07)
Basophils Absolute: 0.1 10*3/uL (ref 0.0–0.1)
Basophils Relative: 1 %
Eosinophils Absolute: 0.3 10*3/uL (ref 0.0–0.5)
Eosinophils Relative: 3 %
HCT: 32.2 % — ABNORMAL LOW (ref 39.0–52.0)
Hemoglobin: 10.6 g/dL — ABNORMAL LOW (ref 13.0–17.0)
Immature Granulocytes: 1 %
Lymphocytes Relative: 17 %
Lymphs Abs: 1.4 10*3/uL (ref 0.7–4.0)
MCH: 29 pg (ref 26.0–34.0)
MCHC: 32.9 g/dL (ref 30.0–36.0)
MCV: 88 fL (ref 80.0–100.0)
Monocytes Absolute: 0.8 10*3/uL (ref 0.1–1.0)
Monocytes Relative: 9 %
Neutro Abs: 6 10*3/uL (ref 1.7–7.7)
Neutrophils Relative %: 69 %
Platelet Count: 256 10*3/uL (ref 150–400)
RBC: 3.66 MIL/uL — ABNORMAL LOW (ref 4.22–5.81)
RDW: 13.9 % (ref 11.5–15.5)
WBC Count: 8.5 10*3/uL (ref 4.0–10.5)
nRBC: 0 % (ref 0.0–0.2)
nRBC: 0 /100{WBCs}

## 2023-08-17 NOTE — Progress Notes (Cosign Needed)
[facility]  [address] [place],  [place]  [address] [phone]  Clinic Day:  08/17/2023  Referring physician: Bobbye Morton, *   HISTORY OF PRESENT ILLNESS:  The patient is a 88 y.o. male with anemia of chronic kidney disease. He previously received epoetin injections every 4 weeks with the last being given in February 2024. Each time he has had an increase in his hemoglobin. He was then admitted in April 2024 with a NSTEMI. Cardiac catheterization revealed in-stent restenosis treated with balloon angioplasty. He was referred back in December by Dr. Dulce Sellar when his hemoglobin dropped to 9.5. We resumed epoetin in December.  He is here today for repeat clinical assessment and states he has been doing well. He denies progressive fatigue concerning for worsening anemia. He still has occasional chest pain with exertion. He ambulates with a cane. His wife accompanies him today.  PHYSICAL EXAM:  Blood pressure (!) 160/60, pulse (!) 58, temperature 98 F (36.7 C), temperature source Oral, resp. rate 18, height 5\' 10"  (1.778 m), weight 158 lb (71.7 kg), SpO2 100%. Wt Readings from Last 3 Encounters:  08/17/23 158 lb (71.7 kg)  07/15/23 162 lb (73.5 kg)  06/22/23 161 lb (73 kg)   Body mass index is 22.67 kg/m.  Performance status (ECOG): 1 - Symptomatic but completely ambulatory  Physical Exam Vitals and nursing note reviewed.  Constitutional:      General: He is not in acute distress.    Appearance: Normal appearance. He is normal weight.  HENT:     Head: Normocephalic and atraumatic.     Mouth/Throat:     Mouth: Mucous membranes are dry.     Pharynx: Oropharynx is clear. No oropharyngeal exudate or posterior oropharyngeal erythema.  Eyes:     General: No scleral icterus.    Extraocular Movements: Extraocular movements intact.     Conjunctiva/sclera: Conjunctivae normal.     Pupils: Pupils are equal, round, and reactive to light.  Cardiovascular:      Rate and Rhythm: Normal rate and regular rhythm.     Heart sounds: Murmur heard.     Systolic murmur is present with a grade of 3/6.     No friction rub. No gallop.  Pulmonary:     Effort: Pulmonary effort is normal.     Breath sounds: Normal breath sounds. No wheezing, rhonchi or rales.  Abdominal:     General: Bowel sounds are normal. There is no distension.     Palpations: Abdomen is soft. There is no hepatomegaly, splenomegaly or mass.     Tenderness: There is no abdominal tenderness.  Musculoskeletal:        General: Normal range of motion.     Cervical back: Normal range of motion and neck supple. No tenderness.     Right lower leg: No edema.     Left lower leg: No edema.  Lymphadenopathy:     Cervical: No cervical adenopathy.     Upper Body:     Right upper body: No supraclavicular or axillary adenopathy.     Left upper body: No supraclavicular or axillary adenopathy.     Lower Body: No right inguinal adenopathy. No left inguinal adenopathy.  Skin:    General: Skin is warm and dry.     Coloration: Skin is not jaundiced.     Findings: No rash.  Neurological:     Mental Status: He is alert and oriented to person, place, and time.  Cranial Nerves: No cranial nerve deficit.  Psychiatric:        Mood and Affect: Mood normal.        Behavior: Behavior normal.        Thought Content: Thought content normal.    LABS:      Latest Ref Rng & Units 08/17/2023   11:00 AM 07/15/2023   10:10 AM 07/08/2023    9:15 AM  CBC  WBC 4.0 - 10.5 K/uL 8.5  7.4  8.0   Hemoglobin 13.0 - 17.0 g/dL 91.4  9.9  9.5   Hematocrit 39.0 - 52.0 % 32.2  30.7  30.1   Platelets 150 - 400 K/uL 256  258  274       Latest Ref Rng & Units 07/15/2023   10:10 AM 05/22/2023    3:52 PM 12/16/2022    9:26 AM  CMP  Glucose 70 - 99 mg/dL 782  956  213   BUN 8 - 23 mg/dL 48  51  46   Creatinine 0.61 - 1.24 mg/dL 0.86  5.78  4.69   Sodium 135 - 145 mmol/L 136  138  138   Potassium 3.5 - 5.1 mmol/L  4.6  5.1  4.9   Chloride 98 - 111 mmol/L 104  103  102   CO2 22 - 32 mmol/L 18  21  22    Calcium 8.9 - 10.3 mg/dL 9.6  9.4  9.7   Total Protein 6.5 - 8.1 g/dL 7.1     Total Bilirubin <1.2 mg/dL 0.4     Alkaline Phos 38 - 126 U/L 175     AST 15 - 41 U/L 38     ALT 0 - 44 U/L 24        Lab Results  Component Value Date   TOTALPROTELP 6.5 03/27/2022   ALBUMINELP 3.4 03/27/2022   A1GS 0.2 03/27/2022   A2GS 0.6 03/27/2022   BETS 0.8 03/27/2022   GAMS 1.4 03/27/2022   MSPIKE Not Observed 03/27/2022   SPEI Comment 03/27/2022   Lab Results  Component Value Date   TIBC 287 07/15/2023   TIBC 258 08/20/2022   TIBC 256 04/16/2022   FERRITIN 142 07/15/2023   FERRITIN 109 08/20/2022   FERRITIN 125 04/16/2022   IRONPCTSAT 30 07/15/2023   IRONPCTSAT 24 08/20/2022   IRONPCTSAT 24 04/16/2022   Lab Results  Component Value Date   LDH 201 (H) 03/27/2022       Component Value Date/Time   TOTALPROTELP 6.5 03/27/2022 1453   ALBUMINELP 3.4 03/27/2022 1453   A1GS 0.2 03/27/2022 1453   A2GS 0.6 03/27/2022 1453   BETS 0.8 03/27/2022 1453   GAMS 1.4 03/27/2022 1453   MSPIKE Not Observed 03/27/2022 1453   SPEI Comment 03/27/2022 1453   LDH 201 (H) 03/27/2022 1453    Review Flowsheet  More data exists      Latest Ref Rng & Units 04/16/2022 08/20/2022 07/15/2023  Oncology Labs  Ferritin 24 - 336 ng/mL 125  109  142   %SAT 17.9 - 39.5 % 24  24  30       STUDIES:  No results found.    ASSESSMENT & PLAN:   Assessment/Plan:  88 y.o. male with anemia due to chronic kidney disease for which he receives epoetin every 4 weeks. He is pleased that he has had a significant increase in his hemoglobin and will not need epoetin today. His CBC will be checked every 4 weeks and epoetin given to keep  his hemoglobin at or over 10. I will plan to see him back in 3 months for repeat clinical assessment. The patient understands all the plans discussed today and is in agreement with them.  He knows to  contact our office if he develops concerns prior to his next appointment.     Adah Perl, PA-C   Physician Assistant [facility] [phone]

## 2023-08-28 ENCOUNTER — Telehealth: Payer: Self-pay | Admitting: Pharmacy Technician

## 2023-08-28 DIAGNOSIS — Z5986 Financial insecurity: Secondary | ICD-10-CM

## 2023-08-28 NOTE — Progress Notes (Signed)
Pharmacy Medication Assistance Program Note    08/28/2023  Patient ID: SOU NOHR, male   DOB: [birth year], 88 y.o.   MRN: [id number]     08/10/2023 08/28/2023  Outreach Medication One  Initial Outreach Date (Medication One) 08/06/2023   Manufacturer Medication One [employer] [facility] Creon   Dose of Creon 24,000 units   Type of Assistance Manufacturer Assistance   Date Application Sent to Patient 08/12/2023   Application Items Requested Application;Proof of Income;Other   Date Application Sent to Prescriber 08/12/2023   Name of Prescriber [address]al [address]   Date Application Received From Patient  08/26/2023  Application Items Received From Patient  Application;Proof of Income;Other  Date Application Received From Provider  08/12/2023  Date Application Submitted to Manufacturer  08/28/2023  Method Application Sent to Manufacturer  Fax        08/10/2023 08/28/2023  Outreach Medication Two  Initial Outreach Date (Medication Two) 08/06/2023   Manufacturer Medication Two Lilly   [employer] [facility] Basaglar   Dose of Basaglar KwikPen 100 units/ml   Type of Radiographer, therapeutic Assistance   Date Application Sent to Patient 08/12/2023   Application Items Requested Application;Proof of Income;Other   Date Application Sent to Prescriber 08/12/2023   Name of Prescriber [address]   Date Application Received From Patient  08/26/2023  Application Items Received From Patient  Application;Proof of Income;Other  Date Application Received From Provider  08/13/2023  Method Application Sent to Manufacturer  Fax  Date Application Submitted to Manufacturer  08/28/2023         08/10/2023 08/28/2023  Outreach Medication Three  Initial Outreach Date (Medication Three) 08/06/2023   Manufacturer Medication Three A[employer]   [employer] Drugs Farxiga   Dose of Farxiga 10mg    Type of Radiographer, therapeutic Assistance   Date Application Sent to Patient 08/12/2023   Application Items Requested  Application;Proof of Income;Other   Date Application Sent to Prescriber 08/12/2023   Name of Prescriber [address]al [address]   Date Application Received From Patient  08/26/2023  Application Items Received From Patient  Application;Proof of Income;Other  Date Application Received From Provider  08/12/2023  Date Application Submitted to Manufacturer  08/28/2023  Method Application Sent to Manufacturer  Fax    Pattricia Boss, CPhT [place]  Office: [phone] Fax: [phone] Email: [email or web address]

## 2023-09-14 ENCOUNTER — Inpatient Hospital Stay: Payer: Medicare Other | Attending: Hematology and Oncology

## 2023-09-14 ENCOUNTER — Inpatient Hospital Stay: Payer: Medicare Other

## 2023-09-14 ENCOUNTER — Telehealth: Payer: Self-pay | Admitting: Pharmacy Technician

## 2023-09-14 VITALS — BP 158/69 | HR 81 | Temp 98.0°F | Resp 20 | Ht 70.0 in | Wt 160.0 lb

## 2023-09-14 DIAGNOSIS — I110 Hypertensive heart disease with heart failure: Secondary | ICD-10-CM | POA: Diagnosis not present

## 2023-09-14 DIAGNOSIS — N184 Chronic kidney disease, stage 4 (severe): Secondary | ICD-10-CM | POA: Diagnosis not present

## 2023-09-14 DIAGNOSIS — Z5986 Financial insecurity: Secondary | ICD-10-CM

## 2023-09-14 DIAGNOSIS — K2960 Other gastritis without bleeding: Secondary | ICD-10-CM | POA: Diagnosis not present

## 2023-09-14 DIAGNOSIS — E1122 Type 2 diabetes mellitus with diabetic chronic kidney disease: Secondary | ICD-10-CM | POA: Diagnosis not present

## 2023-09-14 DIAGNOSIS — N1832 Chronic kidney disease, stage 3b: Secondary | ICD-10-CM | POA: Insufficient documentation

## 2023-09-14 DIAGNOSIS — Z79899 Other long term (current) drug therapy: Secondary | ICD-10-CM | POA: Diagnosis not present

## 2023-09-14 DIAGNOSIS — D631 Anemia in chronic kidney disease: Secondary | ICD-10-CM

## 2023-09-14 DIAGNOSIS — C61 Malignant neoplasm of prostate: Secondary | ICD-10-CM | POA: Diagnosis not present

## 2023-09-14 DIAGNOSIS — E559 Vitamin D deficiency, unspecified: Secondary | ICD-10-CM | POA: Diagnosis not present

## 2023-09-14 DIAGNOSIS — E538 Deficiency of other specified B group vitamins: Secondary | ICD-10-CM | POA: Diagnosis not present

## 2023-09-14 DIAGNOSIS — E782 Mixed hyperlipidemia: Secondary | ICD-10-CM | POA: Diagnosis not present

## 2023-09-14 DIAGNOSIS — E785 Hyperlipidemia, unspecified: Secondary | ICD-10-CM | POA: Diagnosis not present

## 2023-09-14 LAB — CBC WITH DIFFERENTIAL (CANCER CENTER ONLY)
Abs Immature Granulocytes: 0.03 10*3/uL (ref 0.00–0.07)
Basophils Absolute: 0.1 10*3/uL (ref 0.0–0.1)
Basophils Relative: 1 %
Eosinophils Absolute: 0.2 10*3/uL (ref 0.0–0.5)
Eosinophils Relative: 3 %
HCT: 31.2 % — ABNORMAL LOW (ref 39.0–52.0)
Hemoglobin: 10.2 g/dL — ABNORMAL LOW (ref 13.0–17.0)
Immature Granulocytes: 0 %
Lymphocytes Relative: 18 %
Lymphs Abs: 1.3 10*3/uL (ref 0.7–4.0)
MCH: 28.9 pg (ref 26.0–34.0)
MCHC: 32.7 g/dL (ref 30.0–36.0)
MCV: 88.4 fL (ref 80.0–100.0)
Monocytes Absolute: 0.7 10*3/uL (ref 0.1–1.0)
Monocytes Relative: 9 %
Neutro Abs: 5.2 10*3/uL (ref 1.7–7.7)
Neutrophils Relative %: 69 %
Platelet Count: 256 10*3/uL (ref 150–400)
RBC: 3.53 MIL/uL — ABNORMAL LOW (ref 4.22–5.81)
RDW: 14.1 % (ref 11.5–15.5)
WBC Count: 7.5 10*3/uL (ref 4.0–10.5)
nRBC: 0 % (ref 0.0–0.2)
nRBC: 0 /100{WBCs}

## 2023-09-14 MED ORDER — EPOETIN ALFA-EPBX 20000 UNIT/ML IJ SOLN
20000.0000 [IU] | Freq: Once | INTRAMUSCULAR | Status: AC
  Administered 2023-09-14: 20000 [IU] via SUBCUTANEOUS
  Filled 2023-09-14: qty 1

## 2023-09-14 NOTE — Patient Instructions (Signed)

## 2023-09-14 NOTE — Progress Notes (Addendum)
 Pharmacy Medication Assistance Program Note    09/14/2023  Patient ID: Troy [name], male   DOB: 03-Oct-1935, 88 y.o.   MRN: [id number]     08/10/2023 08/28/2023 09/14/2023  Outreach Medication One  Initial Outreach Date (Medication One) 08/06/2023    Manufacturer Medication One [employer] Creon    Dose of Creon 24,000 units    Type of Assistance Manufacturer Assistance    Date Application Sent to Patient 08/12/2023    Application Items Requested Application;Proof of Income;Other    Date Application Sent to Prescriber 08/12/2023    Name of Prescriber Cristal [address]    Date Application Received From Patient  08/26/2023   Application Items Received From Patient  Application;Proof of Income;Other   Date Application Received From Provider  08/12/2023   Date Application Submitted to Manufacturer  08/28/2023   Method Application Sent to Manufacturer  Fax   Patient Assistance Determination   Approved  Approval Start Date   09/05/2023  Approval End Date   07/27/2024  Patient Notification Method   Telephone Call  Telephone Call Outcome   Left Voicemail       08/10/2023 08/28/2023 09/14/2023  Outreach Medication Two  Initial Outreach Date (Medication Two) 08/06/2023    Manufacturer Medication Two Lilly    [employer] Basaglar    Dose of Basaglar KwikPen 100 units/ml    Type of Radiographer, therapeutic Assistance    Date Application Sent to Patient 08/12/2023    Application Items Requested Application;Proof of Income;Other    Date Application Sent to Prescriber 08/12/2023    Name of Prescriber [address]    Date Application Received From Patient  08/26/2023   Application Items Received From Patient  Application;Proof of Income;Other   Date Application Received From Provider  08/13/2023   Method Application Sent to Manufacturer  Fax   Date Application Submitted to Manufacturer  08/28/2023   Patient Assistance Determination   Approved  Approval Start Date   08/28/2023  Patient  Notification Method   Telephone Call  Telephone Call Outcome   Left Voicemail        08/10/2023 08/28/2023 09/14/2023  Outreach Medication Three  Initial Outreach Date (Medication Three) 08/06/2023    Manufacturer Medication Three [employer]    [employer] Farxiga    Dose of Farxiga 10mg     Type of Radiographer, therapeutic Assistance    Date Application Sent to Patient 08/12/2023    Application Items Requested Application;Proof of Income;Other    Date Application Sent to Prescriber 08/12/2023    Name of Prescriber Cristal [address]er [address]    Date Application Received From Patient  08/26/2023   Application Items Received From Patient  Application;Proof of Income;Other   Date Application Received From Provider  08/12/2023   Date Application Submitted to Manufacturer  08/28/2023   Method Application Sent to Manufacturer  Fax   Patient Assistance Determination   Approved  Approval Start Date   08/29/2023  Approval End Date   07/27/2024  Patient Notification Method   Telephone Call   3 care coordination calls placed to [employer] ptaiet assistance for Creon, [employer] patient assistance for [employer] and [employer] patient assistance for [place].  Spoke to [place] at [place] who informs patient is APPROVED 09/05/23-07/27/24 for Creon. Medication will be delivered to the patient's home via [employer]PS. Patient will need to phone [employer] EACH MONTH to re order Creon by dialing [phone].  Spoke to [place] at [place] who informs patient is APPROVED 08/28/23-07/27/24 for [employer].  Initial shipment and subsequent refills will process and ship out automatically to the patient's home. Patient may call Lilly at any time to check on shipments by calling [phone].  Per [employer] IVR system, patient is APPROVED 08/29/23-07/27/24 for [place]. Initial shipment and subsequent refills will process and ship out automatically to the patient's home. Patient may call [employer] at any time to check on shipments by calling  [phone].  Successful outreach call placed to patient in regard to the above information. Spoke to patient's wife Troy [name]. HIPAA verified. Went over each individual program's refill procedure with Troy [name] and provided phone numbers as she needed them. [name] verbalized understanding.  Troy [name], CPhT [place]  Office: [phone] Fax: [phone] Email: [email or web address]

## 2023-09-17 ENCOUNTER — Encounter: Payer: Self-pay | Admitting: Cardiology

## 2023-09-17 DIAGNOSIS — E119 Type 2 diabetes mellitus without complications: Secondary | ICD-10-CM | POA: Insufficient documentation

## 2023-09-18 NOTE — Progress Notes (Signed)
 Cardiology Office Note:    Date:  09/18/2023   ID:  Troy [name], DOB Sep 13, 1935, MRN [id number]  PCP:  Street, Troy Coup, MD  Cardiologist:  Norman Herrlich, MD    Referring MD: [address], Troy [name], *    ASSESSMENT:    1. CAD in native artery   2. Hypertensive heart disease with heart failure (HCC)   3. CKD (chronic kidney disease) stage 4, GFR 15-29 ml/min (HCC)   4. Anemia due to stage 3b chronic kidney disease (HCC)   5. Pure hypercholesterolemia   6. Sinus bradycardia on ECG   7. Mobitz (type) [facility]) atrioventricular block    PLAN:    In order of problems listed above:  [name] has stable chronic CAD doing well after both CABG as well as PCI and stent most recently April 2024 continue long-term therapy with clopidogrel 75 mg daily along with his high intensity statin.  Not on beta-blocker because of previous symptomatic bradycardia but continue is not on rate limiting calcium channel blocker Norvasc 5 mg daily and if needed nitroglycerin sublingually. He has quite labile hypertension his wife does a good job of managing medications titrating and keeps him from having symptomatic hypotension or severe hypertension adjusting the administration of hydralazine.  Continue the same Stable CKD Improved anemia followed by hematology receives erythropoietin Continue his current statin Stable bradycardia asymptomatic   Next appointment: 6 months   Medication Adjustments/Labs and Tests Ordered: Current medicines are reviewed at length with the patient today.  Concerns regarding medicines are outlined above.  No orders of the defined types were placed in this encounter.  No orders of the defined types were placed in this encounter.    History of Present Illness:    Troy [name] is a 88 y.o. male with a hx of CAD with PCI and stents right coronary artery April 2024 hypertensive heart disease with heart failure and stage IV CKD anemia with his CKD and iron  deficiency hyperlipidemia sinus bradycardia Mobitz 1 second-degree AV block and aortic stenosis last seen 06/22/2023.  Compliance with diet, lifestyle and medications: Yes  As always his wife and retired Charity fundraiser for the cardiology floor at [employer] he is with him He has labile blood pressure and often in the morning she holds hydralazine with systolics of 100 or less and in the evenings his blood pressure will go up in the 1 50-1 60 range and again she titrates his medications quite well He has stable exertional angina when he walks faster than usual incline or longer distance but not during daily activities and has not needed nitroglycerin for quite a while He follows with hematology his last hemoglobin a week ago 10.2 Past Medical History:  Diagnosis Date   Anemia 12/09/2015   Anemia due to stage 3 chronic kidney disease (HCC) 04/30/2022   Aortic stenosis, mild 01/15/2021   Atherosclerosis    Benign hypertension with chronic kidney disease, stage III (HCC) 01/15/2021   Benign hypertension with chronic kidney disease, stage IV (HCC) 01/15/2021   Bradycardia 10/01/2020   CAD in native artery 12/09/2015   Overview:  PCI/ stent 2001   Chronic diastolic heart failure (HCC) 12/09/2015   Chronic pancreatitis (HCC)    CKD (chronic kidney disease)    3B   CKD (chronic kidney disease) stage 4, GFR 15-29 ml/min (HCC) 01/15/2021   Coronary artery disease involving native coronary artery of native heart with angina pectoris (HCC) 12/09/2015   Overview:  PCI/ stent 2001  Coronary artery disease involving native coronary artery of native heart with unstable angina pectoris (HCC) 12/09/2015   Overview:   PCI/ stent 2001     Diabetes (HCC)    GERD (gastroesophageal reflux disease)    H/O prostate cancer 01/15/2021   Hearing loss    HFrEF (heart failure with reduced ejection fraction) (HCC) => combination hypertensive heart disease and CAD related 12/09/2015   Hyperlipidemia 12/09/2015    Hyperlipidemia associated with type 2 diabetes mellitus (HCC) 12/09/2015   Hypertensive heart disease with heart failure (HCC) 12/09/2015   Lung nodule 11/05/2020   LVH (left ventricular hypertrophy) 02/10/2022   Macular degeneration    legally blind   MI (myocardial infarction) (HCC)    Necrotizing pancreatitis 06/26/2015   NSTEMI (non-ST elevated myocardial infarction) (HCC) 02/10/2022   Pain due to onychomycosis of toenails of both feet 01/07/2023   Prostate cancer (HCC)    radiation treatments here   Pseudocyst of pancreas 06/26/2015   PVD (peripheral vascular disease) (HCC)    Second degree heart block    Type 2 diabetes mellitus with stage 4 chronic kidney disease, with long-term current use of insulin (HCC) 01/15/2021    Current Medications: No outpatient medications have been marked as taking for the 09/22/23 encounter (Appointment) with Baldo Daub, MD.      EKGs/Labs/Other Studies Reviewed:    The following studies were reviewed today:  Recent Labs: 07/15/2023: ALT 24; BUN 48; Creatinine 2.52; Potassium 4.6; Sodium 136 09/14/2023: Hemoglobin 10.2; Platelet Count 256  Recent Lipid Panel    Component Value Date/Time   CHOL 102 02/10/2022 1607   TRIG 21 02/10/2022 1607   HDL 39 (L) 02/10/2022 1607   CHOLHDL 2.6 02/10/2022 1607   VLDL 4 02/10/2022 1607   LDLCALC 59 02/10/2022 1607    Physical Exam:    VS:  There were no vitals taken for this visit.    Wt Readings from Last 3 Encounters:  09/14/23 160 lb (72.6 kg)  08/17/23 158 lb (71.7 kg)  07/15/23 162 lb (73.5 kg)     GEN: He appears his age and frail well nourished, well developed in no acute distress HEENT: Normal NECK: No JVD; No carotid bruits LYMPHATICS: No lymphadenopathy CARDIAC: 1/6 to 2/6 murmur aortic stenosis RRR, RESPIRATORY:  Clear to auscultation without rales, wheezing or rhonchi  ABDOMEN: Soft, non-tender, non-distended MUSCULOSKELETAL:  No edema; No deformity  SKIN: Warm and  dry NEUROLOGIC:  Alert and oriented x 3 PSYCHIATRIC:  Normal affect    Signed, Norman Herrlich, MD  09/18/2023 9:00 AM    [facility]

## 2023-09-22 ENCOUNTER — Encounter: Payer: Self-pay | Admitting: Cardiology

## 2023-09-22 ENCOUNTER — Ambulatory Visit: Payer: Medicare Other | Attending: Cardiology | Admitting: Cardiology

## 2023-09-22 VITALS — BP 156/68 | HR 72 | Ht 70.0 in | Wt 158.0 lb

## 2023-09-22 DIAGNOSIS — D631 Anemia in chronic kidney disease: Secondary | ICD-10-CM | POA: Diagnosis not present

## 2023-09-22 DIAGNOSIS — N1832 Chronic kidney disease, stage 3b: Secondary | ICD-10-CM

## 2023-09-22 DIAGNOSIS — I2510 Atherosclerotic heart disease of native coronary artery without angina pectoris: Secondary | ICD-10-CM | POA: Diagnosis not present

## 2023-09-22 DIAGNOSIS — R001 Bradycardia, unspecified: Secondary | ICD-10-CM | POA: Diagnosis not present

## 2023-09-22 DIAGNOSIS — E7800 Pure hypercholesterolemia, unspecified: Secondary | ICD-10-CM

## 2023-09-22 DIAGNOSIS — I110 Hypertensive heart disease with heart failure: Secondary | ICD-10-CM | POA: Diagnosis not present

## 2023-09-22 DIAGNOSIS — I441 Atrioventricular block, second degree: Secondary | ICD-10-CM | POA: Diagnosis not present

## 2023-09-22 DIAGNOSIS — N184 Chronic kidney disease, stage 4 (severe): Secondary | ICD-10-CM | POA: Diagnosis not present

## 2023-09-22 MED ORDER — AMLODIPINE BESYLATE 5 MG PO TABS: 5.0000 mg | ORAL_TABLET | Freq: Two times a day (BID) | ORAL | 3 refills | Status: DC

## 2023-09-22 MED ORDER — HYDRALAZINE HCL 25 MG PO TABS: 25.0000 mg | ORAL_TABLET | Freq: Three times a day (TID) | ORAL | 3 refills | Status: AC

## 2023-09-22 NOTE — Patient Instructions (Signed)
Medication Instructions:  Your physician recommends that you continue on your current medications as directed. Please refer to the Current Medication list given to you today.  *If you need a refill on your cardiac medications before your next appointment, please call your pharmacy*   Lab Work: None If you have labs (blood work) drawn today and your tests are completely normal, you will receive your results only by: MyChart Message (if you have MyChart) OR A paper copy in the mail If you have any lab test that is abnormal or we need to change your treatment, we will call you to review the results.   Testing/Procedures: None   Follow-Up: At [facility], you and your health needs are our priority.  As part of our continuing mission to provide you with exceptional heart care, we have created designated Provider Care Teams.  These Care Teams include your primary Cardiologist (physician) and Advanced Practice Providers (APPs -  Physician Assistants and Nurse Practitioners) who all work together to provide you with the care you need, when you need it.  We recommend signing up for the patient portal called "MyChart".  Sign up information is provided on this After Visit Summary.  MyChart is used to connect with patients for Virtual Visits (Telemedicine).  Patients are able to view lab/test results, encounter notes, upcoming appointments, etc.  Non-urgent messages can be sent to your provider as well.   To learn more about what you can do with MyChart, go to [email or web address].    Your next appointment:   3 month(s)  Provider:   Brian Munley, MD    Other Instructions None  

## 2023-09-24 DIAGNOSIS — H353212 Exudative age-related macular degeneration, right eye, with inactive choroidal neovascularization: Secondary | ICD-10-CM | POA: Diagnosis not present

## 2023-09-24 DIAGNOSIS — H353221 Exudative age-related macular degeneration, left eye, with active choroidal neovascularization: Secondary | ICD-10-CM | POA: Diagnosis not present

## 2023-09-29 DIAGNOSIS — L570 Actinic keratosis: Secondary | ICD-10-CM | POA: Diagnosis not present

## 2023-10-06 ENCOUNTER — Other Ambulatory Visit: Payer: Self-pay | Admitting: Cardiology

## 2023-10-08 ENCOUNTER — Ambulatory Visit: Payer: Medicare Other | Admitting: Podiatry

## 2023-10-12 ENCOUNTER — Inpatient Hospital Stay: Payer: Medicare Other | Attending: Hematology and Oncology

## 2023-10-12 ENCOUNTER — Inpatient Hospital Stay: Payer: Medicare Other

## 2023-10-12 ENCOUNTER — Telehealth: Payer: Self-pay | Admitting: Pharmacist

## 2023-10-12 VITALS — BP 155/73 | HR 78 | Temp 98.0°F | Resp 18 | Wt 159.0 lb

## 2023-10-12 DIAGNOSIS — D631 Anemia in chronic kidney disease: Secondary | ICD-10-CM | POA: Insufficient documentation

## 2023-10-12 DIAGNOSIS — N1832 Chronic kidney disease, stage 3b: Secondary | ICD-10-CM | POA: Diagnosis not present

## 2023-10-12 LAB — CBC WITH DIFFERENTIAL (CANCER CENTER ONLY)
Abs Immature Granulocytes: 0.03 10*3/uL (ref 0.00–0.07)
Basophils Absolute: 0.1 10*3/uL (ref 0.0–0.1)
Basophils Relative: 1 %
Eosinophils Absolute: 0.2 10*3/uL (ref 0.0–0.5)
Eosinophils Relative: 3 %
HCT: 31.8 % — ABNORMAL LOW (ref 39.0–52.0)
Hemoglobin: 10.4 g/dL — ABNORMAL LOW (ref 13.0–17.0)
Immature Granulocytes: 0 %
Lymphocytes Relative: 13 %
Lymphs Abs: 1 10*3/uL (ref 0.7–4.0)
MCH: 28.8 pg (ref 26.0–34.0)
MCHC: 32.7 g/dL (ref 30.0–36.0)
MCV: 88.1 fL (ref 80.0–100.0)
Monocytes Absolute: 0.9 10*3/uL (ref 0.1–1.0)
Monocytes Relative: 13 %
Neutro Abs: 5 10*3/uL (ref 1.7–7.7)
Neutrophils Relative %: 70 %
Platelet Count: 268 10*3/uL (ref 150–400)
RBC: 3.61 MIL/uL — ABNORMAL LOW (ref 4.22–5.81)
RDW: 14.7 % (ref 11.5–15.5)
WBC Count: 7.2 10*3/uL (ref 4.0–10.5)
nRBC: 0 % (ref 0.0–0.2)
nRBC: 0 /100{WBCs}

## 2023-10-12 MED ORDER — EPOETIN ALFA-EPBX 20000 UNIT/ML IJ SOLN
20000.0000 [IU] | Freq: Once | INTRAMUSCULAR | Status: AC
  Administered 2023-10-12: 20000 [IU] via SUBCUTANEOUS
  Filled 2023-10-12: qty 1

## 2023-10-12 NOTE — Progress Notes (Signed)
 Attempted to contact patient for medication management re: CGM. Left HIPAA compliant message for patient to return my call at their convenience.   Reynold Bowen, PharmD Clinical Pharmacist [place] Direct Dial: [phone]

## 2023-10-12 NOTE — Patient Instructions (Signed)

## 2023-10-15 ENCOUNTER — Encounter: Payer: Self-pay | Admitting: Hematology and Oncology

## 2023-11-06 ENCOUNTER — Other Ambulatory Visit: Payer: Self-pay | Admitting: Hematology and Oncology

## 2023-11-06 DIAGNOSIS — N183 Chronic kidney disease, stage 3 unspecified: Secondary | ICD-10-CM

## 2023-11-06 NOTE — Progress Notes (Cosign Needed)
 [facility]  [address] [place],  [place]  [address] [phone]  Clinic Day:  11/09/2023  Referring physician: Freya Jesus, *   HISTORY OF PRESENT ILLNESS:  The patient is a 88 y.o. male with anemia of chronic kidney disease. He previously received epoetin injections every 4 weeks with the last being given in February 2024. Each time he has had an increase in his hemoglobin. He was then admitted in April 2024 with a NSTEMI. Cardiac catheterization revealed in-stent restenosis treated with balloon angioplasty. He was referred back in December by Dr. Sandee Crook when his hemoglobin dropped to 9.5. We resumed epoetin in December and he had a good response.  He continues epoetin every 4 weeks to keep his hemoglobin at or above 10 due to his cardiac disease.  He is here today for repeat clinical assessment and states he has been doing well. He denies progressive fatigue concerning for worsening anemia.   He has easy bruising and had bleeding from his glucose monitor on application since his last visit, but otherwise denies any overt form of blood loss. He reports intermittent chest pain, for which nitroglycerin is effective.  PHYSICAL EXAM:  Blood pressure (!) 146/58, pulse 68, temperature 97.8 F (36.6 C), temperature source Oral, resp. rate 18, height 5\' 10"  (1.778 m), weight 155 lb 6.4 oz (70.5 kg), SpO2 100%. Wt Readings from Last 3 Encounters:  11/09/23 155 lb 6.4 oz (70.5 kg)  10/12/23 159 lb (72.1 kg)  09/22/23 158 lb (71.7 kg)   Body mass index is 22.3 kg/m.  Performance status (ECOG): 1 - Symptomatic but completely ambulatory  Physical Exam Vitals and nursing note reviewed.  Constitutional:      General: He is not in acute distress.    Appearance: Normal appearance. He is normal weight.  HENT:     Head: Normocephalic and atraumatic.  Eyes:     General: No scleral icterus.    Extraocular Movements: Extraocular movements intact.      Conjunctiva/sclera: Conjunctivae normal.     Pupils: Pupils are equal, round, and reactive to light.  Cardiovascular:     Rate and Rhythm: Normal rate and regular rhythm.     Heart sounds: Murmur heard.     Systolic murmur is present with a grade of 3/6.     No friction rub. No gallop.  Pulmonary:     Effort: Pulmonary effort is normal.     Breath sounds: Normal breath sounds. No wheezing, rhonchi or rales.  Abdominal:     General: Bowel sounds are normal. There is no distension.     Palpations: Abdomen is soft. There is no mass.     Tenderness: There is no abdominal tenderness.  Musculoskeletal:        General: Normal range of motion.     Cervical back: Normal range of motion and neck supple. No tenderness.     Right lower leg: No edema.     Left lower leg: No edema.  Lymphadenopathy:     Cervical: No cervical adenopathy.  Skin:    General: Skin is warm and dry.     Coloration: Skin is not jaundiced.     Findings: No rash.  Neurological:     Mental Status: He is alert and oriented to person, place, and time.     Cranial Nerves: No cranial nerve deficit.  Psychiatric:        Mood and Affect: Mood normal.  Behavior: Behavior normal.        Thought Content: Thought content normal.     LABS:      Latest Ref Rng & Units 11/09/2023   11:06 AM 10/12/2023   10:34 AM 09/14/2023    1:08 PM  CBC  WBC 4.0 - 10.5 K/uL 8.5  7.2  7.5   Hemoglobin 13.0 - 17.0 g/dL 16.1  09.6  04.5   Hematocrit 39.0 - 52.0 % 34.7  31.8  31.2   Platelets 150 - 400 K/uL 280  268  256       Latest Ref Rng & Units 07/15/2023   10:10 AM 05/22/2023    3:52 PM 12/16/2022    9:26 AM  CMP  Glucose 70 - 99 mg/dL 409  811  914   BUN 8 - 23 mg/dL 48  51  46   Creatinine 0.61 - 1.24 mg/dL 7.82  9.56  2.13   Sodium 135 - 145 mmol/L 136  138  138   Potassium 3.5 - 5.1 mmol/L 4.6  5.1  4.9   Chloride 98 - 111 mmol/L 104  103  102   CO2 22 - 32 mmol/L 18  21  22    Calcium 8.9 - 10.3 mg/dL 9.6  9.4  9.7    Total Protein 6.5 - 8.1 g/dL 7.1     Total Bilirubin <1.2 mg/dL 0.4     Alkaline Phos 38 - 126 U/L 175     AST 15 - 41 U/L 38     ALT 0 - 44 U/L 24        Lab Results  Component Value Date   TOTALPROTELP 6.5 03/27/2022   ALBUMINELP 3.4 03/27/2022   A1GS 0.2 03/27/2022   A2GS 0.6 03/27/2022   BETS 0.8 03/27/2022   GAMS 1.4 03/27/2022   MSPIKE Not Observed 03/27/2022   SPEI Comment 03/27/2022   Lab Results  Component Value Date   TIBC 287 07/15/2023   TIBC 258 08/20/2022   TIBC 256 04/16/2022   FERRITIN 142 07/15/2023   FERRITIN 109 08/20/2022   FERRITIN 125 04/16/2022   IRONPCTSAT 30 07/15/2023   IRONPCTSAT 24 08/20/2022   IRONPCTSAT 24 04/16/2022   Lab Results  Component Value Date   LDH 201 (H) 03/27/2022       Component Value Date/Time   TOTALPROTELP 6.5 03/27/2022 1453   ALBUMINELP 3.4 03/27/2022 1453   A1GS 0.2 03/27/2022 1453   A2GS 0.6 03/27/2022 1453   BETS 0.8 03/27/2022 1453   GAMS 1.4 03/27/2022 1453   MSPIKE Not Observed 03/27/2022 1453   SPEI Comment 03/27/2022 1453   LDH 201 (H) 03/27/2022 1453    Review Flowsheet  More data exists      Latest Ref Rng & Units 04/16/2022 08/20/2022 07/15/2023  Oncology Labs  Ferritin 24 - 336 ng/mL 125  109  142   %SAT 17.9 - 39.5 % 24  24  30       STUDIES:  No results found.    ASSESSMENT & PLAN:   Assessment/Plan:  88 y.o. male with anemia due to chronic kidney disease for which he receives epoetin every 4 weeks.  Repeat vitamin levels are pending from today.  His hemoglobin is 10.9 today, so will hold off on epoetin this month. His CBC will be checked every 4 weeks and epoetin given to keep his hemoglobin at or over 10. I will plan to see him back in 3 months for repeat clinical assessment. The patient understands  all the plans discussed today and is in agreement with them.  He knows to contact our office if he develops concerns prior to his next appointment.     Alfonso Ike, PA-C   Physician  Assistant [facility] [phone]

## 2023-11-09 ENCOUNTER — Inpatient Hospital Stay (HOSPITAL_BASED_OUTPATIENT_CLINIC_OR_DEPARTMENT_OTHER): Payer: Medicare Other | Admitting: Hematology and Oncology

## 2023-11-09 ENCOUNTER — Encounter: Payer: Self-pay | Admitting: Hematology and Oncology

## 2023-11-09 ENCOUNTER — Inpatient Hospital Stay: Payer: Medicare Other | Attending: Hematology and Oncology

## 2023-11-09 ENCOUNTER — Telehealth: Payer: Self-pay | Admitting: Hematology and Oncology

## 2023-11-09 ENCOUNTER — Inpatient Hospital Stay: Payer: Medicare Other

## 2023-11-09 VITALS — BP 146/58 | HR 68 | Temp 97.8°F | Resp 18 | Ht 70.0 in | Wt 155.4 lb

## 2023-11-09 DIAGNOSIS — D631 Anemia in chronic kidney disease: Secondary | ICD-10-CM | POA: Insufficient documentation

## 2023-11-09 DIAGNOSIS — N183 Chronic kidney disease, stage 3 unspecified: Secondary | ICD-10-CM

## 2023-11-09 DIAGNOSIS — N1832 Chronic kidney disease, stage 3b: Secondary | ICD-10-CM | POA: Insufficient documentation

## 2023-11-09 LAB — CBC WITH DIFFERENTIAL (CANCER CENTER ONLY)
Abs Immature Granulocytes: 0.03 10*3/uL (ref 0.00–0.07)
Basophils Absolute: 0.1 10*3/uL (ref 0.0–0.1)
Basophils Relative: 1 %
Eosinophils Absolute: 0.2 10*3/uL (ref 0.0–0.5)
Eosinophils Relative: 3 %
HCT: 34.7 % — ABNORMAL LOW (ref 39.0–52.0)
Hemoglobin: 10.9 g/dL — ABNORMAL LOW (ref 13.0–17.0)
Immature Granulocytes: 0 %
Lymphocytes Relative: 17 %
Lymphs Abs: 1.4 10*3/uL (ref 0.7–4.0)
MCH: 27.8 pg (ref 26.0–34.0)
MCHC: 31.4 g/dL (ref 30.0–36.0)
MCV: 88.5 fL (ref 80.0–100.0)
Monocytes Absolute: 0.7 10*3/uL (ref 0.1–1.0)
Monocytes Relative: 8 %
Neutro Abs: 6 10*3/uL (ref 1.7–7.7)
Neutrophils Relative %: 71 %
Platelet Count: 280 10*3/uL (ref 150–400)
RBC: 3.92 MIL/uL — ABNORMAL LOW (ref 4.22–5.81)
RDW: 14.8 % (ref 11.5–15.5)
WBC Count: 8.5 10*3/uL (ref 4.0–10.5)
nRBC: 0 % (ref 0.0–0.2)
nRBC: 0 /100{WBCs}

## 2023-11-09 LAB — FOLATE: Folate: >40 ng/mL (ref >5.9)

## 2023-11-09 LAB — IRON AND TIBC
Iron: 80 ug/dL (ref 45–182)
Saturation Ratios: 24 % (ref 17.9–39.5)
TIBC: 337 ug/dL (ref 250–450)
UIBC: 257 ug/dL

## 2023-11-09 LAB — FERRITIN: Ferritin: 142 ng/mL (ref 24–336)

## 2023-11-09 LAB — VITAMIN B12: Vitamin B-12: 936 pg/mL — ABNORMAL HIGH (ref 180–914)

## 2023-11-09 NOTE — Telephone Encounter (Signed)
 Patient has been scheduled for follow-up visit per 11/09/23 LOS.  Pt given an appt calendar with date and time.

## 2023-11-10 ENCOUNTER — Telehealth: Payer: Self-pay

## 2023-11-10 NOTE — Telephone Encounter (Signed)
Not able to leave message mail box is full.

## 2023-11-10 NOTE — Telephone Encounter (Signed)
-----   Message from Alfonso Ike sent at 11/09/2023  6:16 PM EDT ----- Please let his wife know the rest of his labs are normal. Thanks

## 2023-11-12 ENCOUNTER — Ambulatory Visit (INDEPENDENT_AMBULATORY_CARE_PROVIDER_SITE_OTHER): Admitting: Podiatry

## 2023-11-12 DIAGNOSIS — M79675 Pain in left toe(s): Secondary | ICD-10-CM

## 2023-11-12 DIAGNOSIS — B351 Tinea unguium: Secondary | ICD-10-CM

## 2023-11-12 DIAGNOSIS — M79674 Pain in right toe(s): Secondary | ICD-10-CM

## 2023-11-12 NOTE — Progress Notes (Signed)
 Subjective:  Patient ID: Troy [name], male    DOB: [birth year],  MRN: [id number]   Troy [name] presents to clinic today for:  Chief Complaint  Patient presents with   [facility] with out callous. Last A1c 7.4, six months ago and takes plavix    Patient notes nails are thick, discolored, elongated and painful in shoegear when trying to ambulate.  Patient notes his right great toenail is thick but does not seem to be growing in length  PCP is Street, Renford Cartwright, MD.  Past Medical History:  Diagnosis Date   Anemia 12/09/2015   Anemia due to stage 3 chronic kidney disease (HCC) 04/30/2022   Aortic stenosis, mild 01/15/2021   Atherosclerosis    Benign hypertension with chronic kidney disease, stage III (HCC) 01/15/2021   Benign hypertension with chronic kidney disease, stage IV (HCC) 01/15/2021   Bradycardia 10/01/2020   CAD in native artery 12/09/2015   Overview:  PCI/ stent 2001   Chronic diastolic heart failure (HCC) 12/09/2015   Chronic pancreatitis (HCC)    CKD (chronic kidney disease)    3B   CKD (chronic kidney disease) stage 4, GFR 15-29 ml/min (HCC) 01/15/2021   Coronary artery disease involving native coronary artery of native heart with angina pectoris (HCC) 12/09/2015   Overview:  PCI/ stent 2001   Coronary artery disease involving native coronary artery of native heart with unstable angina pectoris (HCC) 12/09/2015   Overview:   PCI/ stent 2001     Diabetes (HCC)    GERD (gastroesophageal reflux disease)    H/O prostate cancer 01/15/2021   Hearing loss    HFrEF (heart failure with reduced ejection fraction) (HCC) => combination hypertensive heart disease and CAD related 12/09/2015   Hyperlipidemia 12/09/2015   Hyperlipidemia associated with type 2 diabetes mellitus (HCC) 12/09/2015   Hypertensive heart disease with heart failure (HCC) 12/09/2015   Lung nodule 11/05/2020   LVH (left ventricular hypertrophy) 02/10/2022   Macular degeneration    legally  blind   MI (myocardial infarction) (HCC)    Necrotizing pancreatitis 06/26/2015   NSTEMI (non-ST elevated myocardial infarction) (HCC) 02/10/2022   Pain due to onychomycosis of toenails of both feet 01/07/2023   Prostate cancer (HCC)    radiation treatments here   Pseudocyst of pancreas 06/26/2015   PVD (peripheral vascular disease) (HCC)    Second degree heart block    Type 2 diabetes mellitus with stage 4 chronic kidney disease, with long-term current use of insulin  (HCC) 01/15/2021    Past Surgical History:  Procedure Laterality Date   ABDOMINAL AORTOGRAM W/LOWER EXTREMITY Bilateral 09/10/2018   Procedure: ABDOMINAL AORTOGRAM W/LOWER EXTREMITY;  Surgeon: Richrd Char, MD;  Location: [facility];  Service: Cardiovascular;  Laterality: Bilateral;   cardiac baloon surgery     11/12/2022   CHOLECYSTECTOMY     CORONARY STENT INTERVENTION N/A 02/14/2022   Procedure: CORONARY STENT INTERVENTION;  Surgeon: Sammy Crisp, MD;  Location: [facility];  Service: Cardiovascular;  Laterality: N/A;   CORONARY ULTRASOUND/IVUS N/A 02/14/2022   Procedure: Intravascular Ultrasound/IVUS;  Surgeon: Sammy Crisp, MD;  Location: [facility];  Service: Cardiovascular;  Laterality: N/A;   LEFT HEART CATH N/A 02/14/2022   Procedure: Left Heart Cath;  Surgeon: Sammy Crisp, MD;  Location: [facility];  Service: Cardiovascular;  Laterality: N/A;   LEFT HEART CATH AND CORONARY ANGIOGRAPHY N/A 02/12/2022   Procedure: LEFT HEART CATH AND  CORONARY ANGIOGRAPHY;  Surgeon: Wenona Hamilton, MD;  Location: [facility];  Service: Cardiovascular;  Laterality: N/A;   PERIPHERAL VASCULAR INTERVENTION Right 09/10/2018   Procedure: PERIPHERAL VASCULAR INTERVENTION;  Surgeon: Richrd Char, MD;  Location: [facility];  Service: Cardiovascular;  Laterality: Right;  common iliac   TONSILLECTOMY AND ADENOIDECTOMY     TOTAL KNEE REVISION Bilateral     Allergies   Allergen Reactions   Isordil  [Isosorbide  Nitrate]     Review of Systems: Negative except as noted in the HPI.  Objective:  Troy [name] is a pleasant 88 y.o. male in NAD. AAO x 3.  Vascular Examination: Capillary refill time is 3-5 seconds to toes bilateral. Palpable pedal pulses b/l LE. Digital hair present b/l.  Skin temperature gradient WNL b/l. No varicosities b/l. No cyanosis noted b/l.   Dermatological Examination: Pedal skin with normal turgor, texture and tone b/l. No open wounds. No interdigital macerations b/l. Toenails x10 are 3mm thick, discolored, dystrophic with subungual debris. There is pain with compression of the nail plates.  They are elongated x10.  The right hallux nail is approximately 8 mm in thickness with no length noted after previous trimming and the patient denies trimming the nail between visits  Assessment/Plan: 1. Pain due to onychomycosis of toenails of both feet    The mycotic toenails were sharply debrided x10 with sterile nail nippers and a power debriding burr to decrease bulk/thickness and length.  Informed the patient that sometimes fungal involvement of the nail can stop the growth.  This may be temporary but this may also be permanent.  Return in about 3 months (around 02/11/2024) for [facility].   Joe Murders, DPM, FACFAS [facility]     [address] [place], [place] [address]                Office [phone]  Fax [phone]

## 2023-12-07 ENCOUNTER — Inpatient Hospital Stay

## 2023-12-07 ENCOUNTER — Inpatient Hospital Stay: Attending: Hematology and Oncology

## 2023-12-07 ENCOUNTER — Encounter: Payer: Self-pay | Admitting: Hematology and Oncology

## 2023-12-07 DIAGNOSIS — N1832 Chronic kidney disease, stage 3b: Secondary | ICD-10-CM | POA: Diagnosis not present

## 2023-12-07 DIAGNOSIS — D631 Anemia in chronic kidney disease: Secondary | ICD-10-CM | POA: Diagnosis not present

## 2023-12-07 LAB — CBC WITH DIFFERENTIAL (CANCER CENTER ONLY)
Abs Immature Granulocytes: 0.02 10*3/uL (ref 0.00–0.07)
Basophils Absolute: 0.1 10*3/uL (ref 0.0–0.1)
Basophils Relative: 1 %
Eosinophils Absolute: 0.4 10*3/uL (ref 0.0–0.5)
Eosinophils Relative: 4 %
HCT: 32.2 % — ABNORMAL LOW (ref 39.0–52.0)
Hemoglobin: 10.2 g/dL — ABNORMAL LOW (ref 13.0–17.0)
Immature Granulocytes: 0 %
Lymphocytes Relative: 15 %
Lymphs Abs: 1.2 10*3/uL (ref 0.7–4.0)
MCH: 28.4 pg (ref 26.0–34.0)
MCHC: 31.7 g/dL (ref 30.0–36.0)
MCV: 89.7 fL (ref 80.0–100.0)
Monocytes Absolute: 0.7 10*3/uL (ref 0.1–1.0)
Monocytes Relative: 8 %
Neutro Abs: 6.1 10*3/uL (ref 1.7–7.7)
Neutrophils Relative %: 72 %
Platelet Count: 249 10*3/uL (ref 150–400)
RBC: 3.59 MIL/uL — ABNORMAL LOW (ref 4.22–5.81)
RDW: 14.8 % (ref 11.5–15.5)
WBC Count: 8.4 10*3/uL (ref 4.0–10.5)
nRBC: 0 % (ref 0.0–0.2)

## 2023-12-07 NOTE — Progress Notes (Signed)
 HELD SHOT TODAY HGB = 10.2.

## 2023-12-10 DIAGNOSIS — H353212 Exudative age-related macular degeneration, right eye, with inactive choroidal neovascularization: Secondary | ICD-10-CM | POA: Diagnosis not present

## 2023-12-10 DIAGNOSIS — H353221 Exudative age-related macular degeneration, left eye, with active choroidal neovascularization: Secondary | ICD-10-CM | POA: Diagnosis not present

## 2023-12-31 ENCOUNTER — Ambulatory Visit: Payer: Medicare Other | Admitting: Cardiology

## 2024-01-04 ENCOUNTER — Inpatient Hospital Stay: Attending: Hematology and Oncology

## 2024-01-04 ENCOUNTER — Inpatient Hospital Stay

## 2024-01-04 DIAGNOSIS — D631 Anemia in chronic kidney disease: Secondary | ICD-10-CM | POA: Diagnosis not present

## 2024-01-04 DIAGNOSIS — N1832 Chronic kidney disease, stage 3b: Secondary | ICD-10-CM | POA: Insufficient documentation

## 2024-01-04 DIAGNOSIS — N183 Chronic kidney disease, stage 3 unspecified: Secondary | ICD-10-CM

## 2024-01-04 LAB — CBC WITH DIFFERENTIAL (CANCER CENTER ONLY)
Abs Immature Granulocytes: 0.02 10*3/uL (ref 0.00–0.07)
Basophils Absolute: 0.1 10*3/uL (ref 0.0–0.1)
Basophils Relative: 1 %
Eosinophils Absolute: 0.2 10*3/uL (ref 0.0–0.5)
Eosinophils Relative: 3 %
HCT: 31.4 % — ABNORMAL LOW (ref 39.0–52.0)
Hemoglobin: 10.3 g/dL — ABNORMAL LOW (ref 13.0–17.0)
Immature Granulocytes: 0 %
Lymphocytes Relative: 16 %
Lymphs Abs: 1.3 10*3/uL (ref 0.7–4.0)
MCH: 28.9 pg (ref 26.0–34.0)
MCHC: 32.8 g/dL (ref 30.0–36.0)
MCV: 88.2 fL (ref 80.0–100.0)
Monocytes Absolute: 0.7 10*3/uL (ref 0.1–1.0)
Monocytes Relative: 9 %
Neutro Abs: 5.5 10*3/uL (ref 1.7–7.7)
Neutrophils Relative %: 71 %
Platelet Count: 248 10*3/uL (ref 150–400)
RBC: 3.56 MIL/uL — ABNORMAL LOW (ref 4.22–5.81)
RDW: 15 % (ref 11.5–15.5)
WBC Count: 7.8 10*3/uL (ref 4.0–10.5)
nRBC: 0 % (ref 0.0–0.2)

## 2024-01-04 NOTE — Progress Notes (Signed)
 Retacrit  held for HGB 103- Confirmed with Earline Glenn D

## 2024-01-04 NOTE — Progress Notes (Signed)
 " Cardiology Office Note:    Date:  01/05/2024   ID:  Starleen LOISE Sprang, DOB 01-Feb-1936, MRN [id number]  PCP:  Street, [name], MD   [facility] Providers Cardiologist:  Redell Leiter, MD     Referring MD: Street, [name], *     History of Present Illness:    Troy [name] is a 88 y.o. male with a hx of CAD s/p overlapping DES to [facility] 2001, HFrEF, severe aortic stenosis, CKD stage IV, second-degree heart block, PAD s/p right common iliac stent 2020, lung nodule, GERD, chronic pancreatitis, hyperlipidemia, DM 2, anemia due to CKD, prostate cancer, bradycardia, aortic stenosis.  06/16/2023 echo EF 55 to 60%, mild concentric LVH, grade 1 DD, severe calcification of the aortic valve, low-flow low gradient severe aortic stenosis 06/03/2023 Lexiscan  intermediate risk, findings consistent with infarction however primary cardiologist notified recommendations for medical therapy 11/12/2022 left heart cath 90% in-stent restenosis of proximal and midportion of previously placed RCA stents, treated with balloon angioplasty. 02/12/2022 left heart cath severe one-vessel CAD of the RCA, patent RCA stents with moderate diffuse in-stent restenosis, underwent staged PCI on 02/14/2022 with a successful PCI to RCA with placement of proximal and distal DES that overlapped the previously placed stents--indefinite DAPT Echo on 03/28/2022 revealed mild LVH, impaired relaxation, EF 55 to 60%, mild bilateral dilatation of the atrium, mild MR, moderate aortic stenosis, mild pulmonic valve regurgitation, RVSP elevated at 41 mmHg. 2001 LHC with DES to [facility]  He is established with Dr. Leiter in 2016 for the management of his heart failure and CAD.  In 2001 he underwent DES to [facility]. In 2023 he underwent repeat [facility] revealing severe one-vessel CAD of the RCA, patent RCA stents with moderate diffuse in-stent restenosis, underwent staged PCI on 02/14/2022 with a successful PCI to RCA with placement of proximal and  distal DES that overlapped the previously placed stents--indefinite DAPTl. Mr. Gertz was admitted to [facility] on 11/12/2022 to 11/13/2022, with complaint of chest pain that started 4:30 in the morning, he states he woke up to use the bathroom and started having chest pain, patient denied any aggravating or relieving factors.  Initial blood pressure was elevated at 185/71, creatinine 2.73 but appear to be near his baseline, initial troponin 40 > 237.  He underwent LHC which revealed 90% in-stent restenosis of proximal and mid portions of previously placed RCA stents, treated with balloon angioplasty with improvement in lumen diameter to 50% proximal and 30% mid.  Nonobstructive disease in the RCA.  He did have an episode of hypotension which resolved with holding his blood pressure medications and IV fluids.  Most recently was evaluated by Dr. Leiter in February 2025, stable from a cardiac perspective, blood pressure remained labile, no changes were made to his medications or plan of care and he was advised to follow-up in 3 months.  He presents today companied by his wife for follow-up of his CAD and aortic stenosis.  He is doing stable since he was last evaluated in our office.  His blood pressure remains very labile, his wife manages this meticulously, she is a retired engineer, civil (consulting) and is well versed  in his antihypertensive needs, at home this morning his systolic was less than 120.  We reviewed his most recent echocardiogram revealing low-flow low gradient severe aortic stenosis, he is relatively asymptomatic, and he would not want to pursue any further evaluation or treatment at this time.  He uses his nitroglycerin  a couple  times a month, has stable angina.  He does have some intermittent dizziness, but overall is pleased with the quality of his life. He denies chest pain, palpitations, dyspnea, pnd, orthopnea, n, v, dizziness, syncope, edema, weight gain, or early satiety.   Past  Medical History:  Diagnosis Date   Anemia 12/09/2015   Anemia due to stage 3 chronic kidney disease (HCC) 04/30/2022   Aortic stenosis, mild 01/15/2021   Atherosclerosis    Benign hypertension with chronic kidney disease, stage III (HCC) 01/15/2021   Benign hypertension with chronic kidney disease, stage IV (HCC) 01/15/2021   Bradycardia 10/01/2020   CAD in native artery 12/09/2015   Overview:  PCI/ stent 2001   Chronic diastolic heart failure (HCC) 12/09/2015   Chronic pancreatitis (HCC)    CKD (chronic kidney disease)    3B   CKD (chronic kidney disease) stage 4, GFR 15-29 ml/min (HCC) 01/15/2021   Coronary artery disease involving native coronary artery of native heart with angina pectoris (HCC) 12/09/2015   Overview:  PCI/ stent 2001   Coronary artery disease involving native coronary artery of native heart with unstable angina pectoris (HCC) 12/09/2015   Overview:   PCI/ stent 2001     Diabetes (HCC)    GERD (gastroesophageal reflux disease)    H/O prostate cancer 01/15/2021   Hearing loss    HFrEF (heart failure with reduced ejection fraction) (HCC) => combination hypertensive heart disease and CAD related 12/09/2015   Hyperlipidemia 12/09/2015   Hyperlipidemia associated with type 2 diabetes mellitus (HCC) 12/09/2015   Hypertensive heart disease with heart failure (HCC) 12/09/2015   Lung nodule 11/05/2020   LVH (left ventricular hypertrophy) 02/10/2022   Macular degeneration    legally blind   MI (myocardial infarction) (HCC)    Necrotizing pancreatitis 06/26/2015   NSTEMI (non-ST elevated myocardial infarction) (HCC) 02/10/2022   Pain due to onychomycosis of toenails of both feet 01/07/2023   Prostate cancer (HCC)    radiation treatments here   Pseudocyst of pancreas 06/26/2015   PVD (peripheral vascular disease) (HCC)    Second degree heart block    Type 2 diabetes mellitus with stage 4 chronic kidney disease, with long-term current use of insulin  (HCC) 01/15/2021     Past Surgical History:  Procedure Laterality Date   ABDOMINAL AORTOGRAM W/LOWER EXTREMITY Bilateral 09/10/2018   Procedure: ABDOMINAL AORTOGRAM W/LOWER EXTREMITY;  Surgeon: Harvey Carlin BRAVO, MD;  [facility];  Service: Cardiovascular;  Laterality: Bilateral;   cardiac baloon surgery     11/12/2022   CHOLECYSTECTOMY     CORONARY STENT INTERVENTION N/A 02/14/2022   Procedure: CORONARY STENT INTERVENTION;  Surgeon: Mady Bruckner, MD;  [facility];  Service: Cardiovascular;  Laterality: N/A;   CORONARY ULTRASOUND/IVUS N/A 02/14/2022   Procedure: Intravascular Ultrasound/IVUS;  Surgeon: Mady Bruckner, MD;  [facility];  Service: Cardiovascular;  Laterality: N/A;   LEFT HEART CATH N/A 02/14/2022   Procedure: Left Heart Cath;  Surgeon: Mady Bruckner, MD;  [facility];  Service: Cardiovascular;  Laterality: N/A;   LEFT HEART CATH AND CORONARY ANGIOGRAPHY N/A 02/12/2022   Procedure: LEFT HEART CATH AND CORONARY ANGIOGRAPHY;  Surgeon: Darron Deatrice LABOR, MD;  [facility];  Service: Cardiovascular;  Laterality: N/A;   PERIPHERAL VASCULAR INTERVENTION Right 09/10/2018   Procedure: PERIPHERAL VASCULAR INTERVENTION;  Surgeon: Harvey Carlin BRAVO, MD;  [facility];  Service: Cardiovascular;  Laterality: Right;  common iliac  TONSILLECTOMY AND ADENOIDECTOMY     TOTAL KNEE REVISION Bilateral     Current Medications: Current Meds  Medication Sig   acetaminophen  (TYLENOL ) 500 MG tablet Take 500-1,000 mg by mouth every 6 (six) hours as needed for moderate pain.   ALPRAZolam (XANAX) 0.25 MG tablet Take 0.25 mg by mouth at bedtime as needed for anxiety or sleep. 0.5 - 1 tablet 1-3 times daily prn   For anxiety   amLODipine  (NORVASC ) 5 MG tablet Take 1 tablet (5 mg total) by mouth in the morning and at bedtime.   atorvastatin  (LIPITOR ) 80 MG tablet TAKE 1 TABLET EACH EVENING   B Complex Vitamins  (VITAMIN B COMPLEX) TABS Take 1 tablet by mouth daily.   Cinnamon 500 MG capsule Take 500 mg by mouth daily.   clopidogrel  (PLAVIX ) 75 MG tablet Take 75 mg by mouth daily.   co-enzyme Q-10 30 MG capsule Take 30 mg by mouth daily.   dapagliflozin propanediol (FARXIGA) 10 MG TABS tablet Take 10 mg by mouth daily.   doxazosin  (CARDURA ) 2 MG tablet Take 2 mg by mouth at bedtime.    hydrALAZINE  (APRESOLINE ) 25 MG tablet Take 1 tablet (25 mg total) by mouth 3 (three) times daily.   insulin  glargine (LANTUS ) 100 UNIT/ML injection Inject 12 Units into the skin daily.   Iron Combinations (IRON COMPLEX PO) Take 65 mg by mouth every other day.   Multiple Vitamins-Minerals (EYE VITAMINS PO) Take 1 tablet by mouth daily.   nitroGLYCERIN  (NITROSTAT ) 0.4 MG SL tablet Place 1 tablet (0.4 mg total) under the tongue every 5 (five) minutes as needed for chest pain.   Pancrelipase , Lip-Prot-Amyl, (CREON ) 24000-76000 units CPEP Take 1 capsule by mouth in the morning, at noon, and at bedtime.   pantoprazole (PROTONIX) 40 MG tablet Take 40 mg by mouth daily.   Propylene Glycol (SYSTANE BALANCE) 0.6 % SOLN Place 1 drop into both eyes at bedtime.   vitamin C (ASCORBIC ACID) 500 MG tablet Take 500 mg by mouth daily.     Allergies:   Isordil  [isosorbide  nitrate]   Social History   Socioeconomic History   Marital status: Married    Spouse name: [name]   Number of children: 3   Years of education: 16   Highest education level: Associate degree: academic program  Occupational History   Occupation: Retired   Occupation: retired education officer, environmental  Tobacco Use   Smoking status: Never    Passive exposure: Past   Smokeless tobacco: Never  Vaping Use   Vaping status: Never Used  Substance and Sexual Activity   Alcohol use: No   Drug use: No   Sexual activity: Not on file  Other Topics Concern   Not on file  Social History Narrative   Not on file   Social Drivers of Health   Financial Resource Strain: Low Risk   (02/14/2022)   Overall Financial Resource Strain (CARDIA)    Difficulty of Paying Living Expenses: Not hard at all  Food Insecurity: No Food Insecurity (02/14/2022)   Hunger Vital Sign    Worried About Running Out of Food in the Last Year: Never true    Ran Out of Food in the Last Year: Never true  Transportation Needs: No Transportation Needs (02/14/2022)   PRAPARE - Administrator, Civil Service (Medical): No    Lack of Transportation (Non-Medical): No  Physical Activity: Not on file  Stress: Not on file  Social Connections: Not on file  Family History: The patient's family history includes Diabetes in his mother; Heart failure in his father and mother.  ROS:   Please see the history of present illness.    All other systems reviewed and are negative.  EKGs/Labs/Other Studies Reviewed:    The following studies were reviewed today:   LHC [facility] 11/12/22- 1. 90% in-stent restenosis of proximal and mid portions of previously-placed RCA stents, treated with balloon angioplasty with improvement in lumen diameter to 50% (proximal) and 30% (mid). 2. Nonobstructive disease in the RCA. 3. 95 cc of contrast 4. LRA access, hemostasis with TR band. 5. No complications. Overnight patient had an hypotensive episode   EKG:     n/a today  Recent Labs: 07/15/2023: ALT 24; BUN 48; Creatinine 2.52; Potassium 4.6; Sodium 136 01/04/2024: Hemoglobin 10.3; Platelet Count 248  Recent Lipid Panel    Component Value Date/Time   CHOL 102 02/10/2022 1607   TRIG 21 02/10/2022 1607   HDL 39 (L) 02/10/2022 1607   CHOLHDL 2.6 02/10/2022 1607   VLDL 4 02/10/2022 1607   LDLCALC 59 02/10/2022 1607     Risk Assessment/Calculations:                Physical Exam:    VS:  BP 119/64 Comment: home bp reading  Pulse 60   Ht 5' 10 (1.778 m)   Wt 152 lb (68.9 kg)   SpO2 95%   BMI 21.81 kg/m     Wt Readings from Last 3 Encounters:  01/05/24 152 lb (68.9  kg)  11/09/23 155 lb 6.4 oz (70.5 kg)  10/12/23 159 lb (72.1 kg)     GEN: appears younger than stated age, well nourished, well developed in no acute distress HEENT: Normal NECK: No JVD; No carotid bruits LYMPHATICS: No lymphadenopathy CARDIAC: RRR, 3/6 systolic murmur R/LSB radiating to his carotid arteries RESPIRATORY:  Clear to auscultation without rales, wheezing or rhonchi  ABDOMEN: Soft, non-tender, non-distended MUSCULOSKELETAL:  No edema; No deformity  SKIN: Warm and dry NEUROLOGIC:  Alert and oriented x 3 PSYCHIATRIC:  Normal affect   ASSESSMENT:    1. CAD in native artery   2. CKD (chronic kidney disease) stage 4, GFR 15-29 ml/min (HCC)   3. Severe aortic stenosis      PLAN:    In order of problems listed above:  CAD-overlapping DES to mid RCA, Lexiscan  last year ischemia however he was relatively asymptomatic and per his primary cardiologist recommendations for medical treatment. Continue ASA 81 mg daily, continue Lipitor  80 mg daily, continue Plavix  75 mg daily.    CKD stage IV- Careful titration of diuretics and antihypertensives.   Aortic valve stenosis-low-flow, low gradient severe aortic stenosis.  He is relatively asymptomatic, he does have some shortness of breath however if he slows down this typically subsides.  We discussed repeat imaging with echocardiogram however he is not interested in this as he would not be willing to pursue any further treatments at this time and politely declines.  Hypertension- BP today is elevated, but blood pressure log at home is well-controlled and his wife is a retired engineer, civil (consulting) who is well versed  in managing his blood pressure.  Continue hydralazine  25 mg three times/day, continue Norvasc  5 mg daily.  He feels very poorly when his blood pressure is much less than 150 systolic, almost fatigue like he cannot function.  Dyslipidemia-followed by his PCP, continue Lipitor  80 mg daily.  Sinus bradycardia/Mobitz type I AV block- most  recent  EKG revealed first degree AV block, HR 74.  He has also had issues with tachycardia at times so we gave him a as needed dose of metoprolol  previously for heart rate greater than 90.  PAD- s/p right common iliac stent 2020, on DAPT, Lipitor .    Disposition - follow up in 6 months.        Medication Adjustments/Labs and Tests Ordered: Current medicines are reviewed at length with the patient today.  Concerns regarding medicines are outlined above.  No orders of the defined types were placed in this encounter.  No orders of the defined types were placed in this encounter.   Patient Instructions  Medication Instructions:   *If you need a refill on your cardiac medications before your next appointment, please call your pharmacy*  Lab Work:  If you have labs (blood work) drawn today and your tests are completely normal, you will receive your results only by: MyChart Message (if you have MyChart) OR A paper copy in the mail If you have any lab test that is abnormal or we need to change your treatment, we will call you to review the results.  Testing/Procedures:   Follow-Up: At [facility], you and your health needs are our priority.  As part of our continuing mission to provide you with exceptional heart care, our providers are all part of one team.  This team includes your primary Cardiologist (physician) and Advanced Practice Providers or APPs (Physician Assistants and Nurse Practitioners) who all work together to provide you with the care you need, when you need it.  Your next appointment:   6 month(s)  Provider:   Redell Leiter, MD    We recommend signing up for the patient portal called MyChart.  Sign up information is provided on this After Visit Summary.  MyChart is used to connect with patients for Virtual Visits (Telemedicine).  Patients are able to view lab/test results, encounter notes, upcoming appointments, etc.  Non-urgent messages can be sent to your  provider as well.   To learn more about what you can do with MyChart, go to [email or web address].   Other Instructions  Have a wonderful summer!!       Signed, Delon JAYSON Hoover, NP  01/05/2024 4:32 PM    [facility] "

## 2024-01-05 ENCOUNTER — Encounter: Payer: Self-pay | Admitting: Cardiology

## 2024-01-05 ENCOUNTER — Ambulatory Visit: Attending: Cardiology | Admitting: Cardiology

## 2024-01-05 VITALS — BP 119/64 | HR 60 | Ht 70.0 in | Wt 152.0 lb

## 2024-01-05 DIAGNOSIS — I2510 Atherosclerotic heart disease of native coronary artery without angina pectoris: Secondary | ICD-10-CM | POA: Diagnosis not present

## 2024-01-05 DIAGNOSIS — N184 Chronic kidney disease, stage 4 (severe): Secondary | ICD-10-CM | POA: Diagnosis not present

## 2024-01-05 DIAGNOSIS — I441 Atrioventricular block, second degree: Secondary | ICD-10-CM | POA: Diagnosis not present

## 2024-01-05 DIAGNOSIS — I10 Essential (primary) hypertension: Secondary | ICD-10-CM | POA: Diagnosis not present

## 2024-01-05 DIAGNOSIS — I350 Nonrheumatic aortic (valve) stenosis: Secondary | ICD-10-CM | POA: Diagnosis not present

## 2024-01-05 DIAGNOSIS — I739 Peripheral vascular disease, unspecified: Secondary | ICD-10-CM

## 2024-01-05 NOTE — Patient Instructions (Signed)
 Medication Instructions:   *If you need a refill on your cardiac medications before your next appointment, please call your pharmacy*  Lab Work:  If you have labs (blood work) drawn today and your tests are completely normal, you will receive your results only by: MyChart Message (if you have MyChart) OR A paper copy in the mail If you have any lab test that is abnormal or we need to change your treatment, we will call you to review the results.  Testing/Procedures:   Follow-Up: At [facility], you and your health needs are our priority.  As part of our continuing mission to provide you with exceptional heart care, our providers are all part of one team.  This team includes your primary Cardiologist (physician) and Advanced Practice Providers or APPs (Physician Assistants and Nurse Practitioners) who all work together to provide you with the care you need, when you need it.  Your next appointment:   6 month(s)  Provider:   Zoe Hinds, MD    We recommend signing up for the patient portal called "MyChart".  Sign up information is provided on this After Visit Summary.  MyChart is used to connect with patients for Virtual Visits (Telemedicine).  Patients are able to view lab/test results, encounter notes, upcoming appointments, etc.  Non-urgent messages can be sent to your provider as well.   To learn more about what you can do with MyChart, go to [email or web address].   Other Instructions  Have a wonderful summer!!

## 2024-01-11 ENCOUNTER — Ambulatory Visit: Admitting: Cardiology

## 2024-01-11 DIAGNOSIS — E1122 Type 2 diabetes mellitus with diabetic chronic kidney disease: Secondary | ICD-10-CM | POA: Diagnosis not present

## 2024-01-11 DIAGNOSIS — K2960 Other gastritis without bleeding: Secondary | ICD-10-CM | POA: Diagnosis not present

## 2024-01-11 DIAGNOSIS — N184 Chronic kidney disease, stage 4 (severe): Secondary | ICD-10-CM | POA: Diagnosis not present

## 2024-01-11 DIAGNOSIS — I25119 Atherosclerotic heart disease of native coronary artery with unspecified angina pectoris: Secondary | ICD-10-CM | POA: Diagnosis not present

## 2024-01-11 DIAGNOSIS — E782 Mixed hyperlipidemia: Secondary | ICD-10-CM | POA: Diagnosis not present

## 2024-01-11 DIAGNOSIS — Z79899 Other long term (current) drug therapy: Secondary | ICD-10-CM | POA: Diagnosis not present

## 2024-01-11 DIAGNOSIS — D638 Anemia in other chronic diseases classified elsewhere: Secondary | ICD-10-CM | POA: Diagnosis not present

## 2024-01-11 DIAGNOSIS — Z0000 Encounter for general adult medical examination without abnormal findings: Secondary | ICD-10-CM | POA: Diagnosis not present

## 2024-01-20 ENCOUNTER — Telehealth: Payer: Self-pay | Admitting: Cardiology

## 2024-01-20 MED ORDER — NITROGLYCERIN 0.4 MG SL SUBL
0.4000 mg | SUBLINGUAL_TABLET | SUBLINGUAL | 3 refills | Status: AC | PRN
Start: 2024-01-20 — End: ?

## 2024-01-20 NOTE — Telephone Encounter (Signed)
*  STAT* If patient is at the pharmacy, call can be transferred to refill team.   1. Which medications need to be refilled? (please list name of each medication and dose if known) nitroGLYCERIN  (NITROSTAT ) 0.4 MG SL tablet   2. Which pharmacy/location (including street and city if local pharmacy) is medication to be sent to?n  [facility] - [place], [place] - [address]    3. Do they need a 30 day or 90 day supply? 910

## 2024-01-20 NOTE — Telephone Encounter (Signed)
 RX sent to requested Pharmacy

## 2024-02-11 ENCOUNTER — Ambulatory Visit (INDEPENDENT_AMBULATORY_CARE_PROVIDER_SITE_OTHER): Admitting: Podiatry

## 2024-02-11 DIAGNOSIS — B351 Tinea unguium: Secondary | ICD-10-CM | POA: Diagnosis not present

## 2024-02-11 DIAGNOSIS — M79674 Pain in right toe(s): Secondary | ICD-10-CM

## 2024-02-11 DIAGNOSIS — M25572 Pain in left ankle and joints of left foot: Secondary | ICD-10-CM

## 2024-02-11 DIAGNOSIS — M79675 Pain in left toe(s): Secondary | ICD-10-CM

## 2024-02-11 DIAGNOSIS — M6702 Short Achilles tendon (acquired), left ankle: Secondary | ICD-10-CM

## 2024-02-11 NOTE — Progress Notes (Signed)
 Subjective:  Patient ID: Troy [name], male    DOB: Jan 07, 1936,  MRN: [id number]  Troy [name] presents to clinic today for:  Chief Complaint  Patient presents with   [facility]    Last A1c: 7.2. Takes Plavix . Nail care today.    Patient notes nails are thick, discolored, elongated and painful in shoegear when trying to ambulate.    He also notes some discomfort at night across the dorsal lateral part of the left midfoot.  He states that something feels like it is pulling around that area.  He is active and does like to walk, but does not do any formal stretching.  PCP is Street, [name], MD.  Past Medical History:  Diagnosis Date   Anemia 12/09/2015   Anemia due to stage 3 chronic kidney disease (HCC) 04/30/2022   Aortic stenosis, mild 01/15/2021   Atherosclerosis    Benign hypertension with chronic kidney disease, stage III (HCC) 01/15/2021   Benign hypertension with chronic kidney disease, stage IV (HCC) 01/15/2021   Bradycardia 10/01/2020   CAD in native artery 12/09/2015   Overview:  PCI/ stent 2001   Chronic diastolic heart failure (HCC) 12/09/2015   Chronic pancreatitis (HCC)    CKD (chronic kidney disease)    3B   CKD (chronic kidney disease) stage 4, GFR 15-29 ml/min (HCC) 01/15/2021   Coronary artery disease involving native coronary artery of native heart with angina pectoris (HCC) 12/09/2015   Overview:  PCI/ stent 2001   Coronary artery disease involving native coronary artery of native heart with unstable angina pectoris (HCC) 12/09/2015   Overview:   PCI/ stent 2001     Diabetes (HCC)    GERD (gastroesophageal reflux disease)    H/O prostate cancer 01/15/2021   Hearing loss    HFrEF (heart failure with reduced ejection fraction) (HCC) => combination hypertensive heart disease and CAD related 12/09/2015   Hyperlipidemia 12/09/2015   Hyperlipidemia associated with type 2 diabetes mellitus (HCC) 12/09/2015   Hypertensive heart disease with heart failure  (HCC) 12/09/2015   Lung nodule 11/05/2020   LVH (left ventricular hypertrophy) 02/10/2022   Macular degeneration    legally blind   MI (myocardial infarction) (HCC)    Necrotizing pancreatitis 06/26/2015   NSTEMI (non-ST elevated myocardial infarction) (HCC) 02/10/2022   Pain due to onychomycosis of toenails of both feet 01/07/2023   Prostate cancer (HCC)    radiation treatments here   Pseudocyst of pancreas 06/26/2015   PVD (peripheral vascular disease) (HCC)    Second degree heart block    Type 2 diabetes mellitus with stage 4 chronic kidney disease, with long-term current use of insulin  (HCC) 01/15/2021   Past Surgical History:  Procedure Laterality Date   ABDOMINAL AORTOGRAM W/LOWER EXTREMITY Bilateral 09/10/2018   Procedure: ABDOMINAL AORTOGRAM W/LOWER EXTREMITY;  Surgeon: Harvey Carlin BRAVO, MD;  [facility];  Service: Cardiovascular;  Laterality: Bilateral;   cardiac baloon surgery     11/12/2022   CHOLECYSTECTOMY     CORONARY STENT INTERVENTION N/A 02/14/2022   Procedure: CORONARY STENT INTERVENTION;  Surgeon: Mady Lonni, MD;  [facility];  Service: Cardiovascular;  Laterality: N/A;   CORONARY ULTRASOUND/IVUS N/A 02/14/2022   Procedure: Intravascular Ultrasound/IVUS;  Surgeon: Mady Lonni, MD;  [facility];  Service: Cardiovascular;  Laterality: N/A;   LEFT HEART CATH N/A 02/14/2022   Procedure: Left Heart Cath;  Surgeon: Mady Lonni, MD;  [facility];  Service:  Cardiovascular;  Laterality: N/A;   LEFT HEART CATH AND CORONARY ANGIOGRAPHY N/A 02/12/2022   Procedure: LEFT HEART CATH AND CORONARY ANGIOGRAPHY;  Surgeon: Darron Deatrice LABOR, MD;  [facility];  Service: Cardiovascular;  Laterality: N/A;   PERIPHERAL VASCULAR INTERVENTION Right 09/10/2018   Procedure: PERIPHERAL VASCULAR INTERVENTION;  Surgeon: Harvey Carlin BRAVO, MD;  [facility];  Service:  Cardiovascular;  Laterality: Right;  common iliac   TONSILLECTOMY AND ADENOIDECTOMY     TOTAL KNEE REVISION Bilateral    Allergies  Allergen Reactions   Isordil  [Isosorbide  Nitrate]    Review of Systems: Negative except as noted in the HPI.  Objective:  Troy [name] is a pleasant 88 y.o. male in NAD. AAO x 3.  Vascular Examination: Capillary refill time is 3-5 seconds to toes bilateral. Palpable pedal pulses b/l LE. Digital hair present b/l.  Skin temperature gradient WNL b/l. No varicosities b/l. No cyanosis noted b/l.   Dermatological Examination: Pedal skin with normal turgor, texture and tone b/l. No open wounds. No interdigital macerations b/l. Toenails x10 are 3mm thick, discolored, dystrophic with subungual debris. There is pain with compression of the nail plates.  They are elongated x10  Orthopedic examination: There is decreased ankle dorsiflexion with the knee extended bilateral.  Mild diffuse pain on palpation across the dorsal lateral aspect of the tarsometatarsal joints.  Assessment/Plan: 1. Pain due to onychomycosis of toenails of both feet   2. Acquired tight Achilles tendon, left   3. Arthralgia of left foot    The mycotic toenails were sharply debrided x10 with sterile nail nippers and a power debriding burr to decrease bulk/thickness and length.    Patient encouraged to perform stretching exercises.  These were printed and dispensed at checkout.  If he continues to have pain in the area he can come back for recheck and we will x-ray this foot.  Return in about 3 months (around 05/13/2024) for [facility].   Awanda CHARM Imperial, DPM, FACFAS [facility]     [address] [place], [place] [address]                Office [phone]  Fax [phone]

## 2024-02-11 NOTE — Patient Instructions (Signed)

## 2024-02-15 ENCOUNTER — Telehealth: Payer: Self-pay | Admitting: Hematology and Oncology

## 2024-02-15 ENCOUNTER — Inpatient Hospital Stay

## 2024-02-15 ENCOUNTER — Encounter: Payer: Self-pay | Admitting: Hematology and Oncology

## 2024-02-15 ENCOUNTER — Inpatient Hospital Stay: Attending: Hematology and Oncology | Admitting: Hematology and Oncology

## 2024-02-15 ENCOUNTER — Other Ambulatory Visit: Payer: Self-pay

## 2024-02-15 VITALS — BP 164/63 | HR 71 | Temp 97.6°F | Resp 20 | Ht 70.0 in | Wt 153.6 lb

## 2024-02-15 VITALS — BP 162/59 | HR 67 | Resp 18

## 2024-02-15 DIAGNOSIS — N1832 Chronic kidney disease, stage 3b: Secondary | ICD-10-CM | POA: Diagnosis not present

## 2024-02-15 DIAGNOSIS — D631 Anemia in chronic kidney disease: Secondary | ICD-10-CM | POA: Diagnosis not present

## 2024-02-15 DIAGNOSIS — I252 Old myocardial infarction: Secondary | ICD-10-CM | POA: Diagnosis not present

## 2024-02-15 DIAGNOSIS — N183 Chronic kidney disease, stage 3 unspecified: Secondary | ICD-10-CM

## 2024-02-15 LAB — CBC WITH DIFFERENTIAL (CANCER CENTER ONLY)
Abs Immature Granulocytes: 0.05 K/uL (ref 0.00–0.07)
Basophils Absolute: 0.1 K/uL (ref 0.0–0.1)
Basophils Relative: 1 %
Eosinophils Absolute: 0.3 K/uL (ref 0.0–0.5)
Eosinophils Relative: 4 %
HCT: 30.7 % — ABNORMAL LOW (ref 39.0–52.0)
Hemoglobin: 9.7 g/dL — ABNORMAL LOW (ref 13.0–17.0)
Immature Granulocytes: 1 %
Lymphocytes Relative: 16 %
Lymphs Abs: 1.2 K/uL (ref 0.7–4.0)
MCH: 29 pg (ref 26.0–34.0)
MCHC: 31.6 g/dL (ref 30.0–36.0)
MCV: 91.6 fL (ref 80.0–100.0)
Monocytes Absolute: 0.9 K/uL (ref 0.1–1.0)
Monocytes Relative: 11 %
Neutro Abs: 5.3 K/uL (ref 1.7–7.7)
Neutrophils Relative %: 67 %
Platelet Count: 269 K/uL (ref 150–400)
RBC: 3.35 MIL/uL — ABNORMAL LOW (ref 4.22–5.81)
RDW: 14.6 % (ref 11.5–15.5)
WBC Count: 7.8 K/uL (ref 4.0–10.5)
nRBC: 0 % (ref 0.0–0.2)

## 2024-02-15 LAB — IRON AND TIBC
Iron: 52 ug/dL (ref 45–182)
Saturation Ratios: 17 % — ABNORMAL LOW (ref 17.9–39.5)
TIBC: 298 ug/dL (ref 250–450)
UIBC: 246 ug/dL

## 2024-02-15 LAB — VITAMIN B12: Vitamin B-12: 943 pg/mL — ABNORMAL HIGH (ref 180–914)

## 2024-02-15 LAB — FERRITIN: Ferritin: 170 ng/mL (ref 24–336)

## 2024-02-15 MED ORDER — DARBEPOETIN ALFA 100 MCG/0.5ML IJ SOSY
100.0000 ug | PREFILLED_SYRINGE | Freq: Once | INTRAMUSCULAR | Status: AC
  Administered 2024-02-15: 100 ug via SUBCUTANEOUS
  Filled 2024-02-15: qty 0.5

## 2024-02-15 NOTE — Progress Notes (Cosign Needed)
 [facility]  [address] [place],  [place]  [address] [phone]  Clinic Day:  02/15/2024  Referring physician: Rusty [name], *   HISTORY OF PRESENT ILLNESS:  The patient is a 88 y.o. male with anemia of chronic kidney disease. He received epoetin  alpha injections every 4 weeks. Each time he has had an increase in his hemoglobin. He was then admitted in April 2024 with a NSTEMI. Cardiac catheterization revealed in-stent restenosis treated with balloon angioplasty. He was referred back in December 2024 by Dr. Monetta, when his hemoglobin dropped to 9.5. We resumed epoetin  alpha in December and he had a good response.  He continues epoetin  every 4 weeks as needed to keep his hemoglobin at or above 10 due to his cardiac disease.  He is here today for repeat clinical assessment and states he has been doing well.  He denies progressive fatigue concerning for worsening anemia.  He reports shortness of breath with exertion.  He has easy bruising, but otherwise denies any overt form of blood loss. He reports occasional chest pain, for which nitroglycerin  is effective.  He denies chest pain today.  He did not take his blood pressure medications this morning as it was normal at home.  He states he takes his blood pressure at home 3 times daily and is taking his blood pressure medication as needed.  He will repeat his blood pressure at home and if it remains high, plan to take his blood pressure medication.    PHYSICAL EXAM:  Blood pressure (!) 164/63, pulse 71, temperature 97.6 F (36.4 C), temperature source Oral, resp. rate 20, height 5' 10 (1.778 m), weight 153 lb 9.6 oz (69.7 kg), SpO2 100%. Wt Readings from Last 3 Encounters:  02/15/24 153 lb 9.6 oz (69.7 kg)  01/05/24 152 lb (68.9 kg)  11/09/23 155 lb 6.4 oz (70.5 kg)   Body mass index is 22.04 kg/m.  Performance status (ECOG): 1 - Symptomatic but completely ambulatory  Physical Exam Vitals and nursing note  reviewed.  Constitutional:      General: He is not in acute distress.    Appearance: Normal appearance. He is normal weight.  HENT:     Head: Normocephalic and atraumatic.     Mouth/Throat:     Mouth: Mucous membranes are moist.     Pharynx: Oropharynx is clear. No oropharyngeal exudate or posterior oropharyngeal erythema.  Eyes:     General: No scleral icterus.    Extraocular Movements: Extraocular movements intact.     Conjunctiva/sclera: Conjunctivae normal.     Pupils: Pupils are equal, round, and reactive to light.  Cardiovascular:     Rate and Rhythm: Normal rate and regular rhythm.     Heart sounds: Murmur heard.     Systolic murmur is present with a grade of 3/6.     No friction rub. No gallop.  Pulmonary:     Effort: Pulmonary effort is normal.     Breath sounds: Normal breath sounds. No wheezing, rhonchi or rales.  Abdominal:     General: Bowel sounds are normal. There is no distension.     Palpations: Abdomen is soft. There is no mass.     Tenderness: There is no abdominal tenderness.  Musculoskeletal:        General: Normal range of motion.     Cervical back: Normal range of motion and neck supple. No tenderness.     Right lower leg: No edema.     Left  lower leg: No edema.  Lymphadenopathy:     Cervical: No cervical adenopathy.  Skin:    General: Skin is warm and dry.     Coloration: Skin is not jaundiced.     Findings: No rash.  Neurological:     Mental Status: He is alert and oriented to person, place, and time.     Cranial Nerves: No cranial nerve deficit.  Psychiatric:        Mood and Affect: Mood normal.        Behavior: Behavior normal.        Thought Content: Thought content normal.     LABS:      Latest Ref Rng & Units 02/15/2024   10:33 AM 01/04/2024   10:15 AM 12/07/2023   10:10 AM  CBC  WBC 4.0 - 10.5 K/uL 7.8  7.8  8.4   Hemoglobin 13.0 - 17.0 g/dL 9.7  89.6  89.7   Hematocrit 39.0 - 52.0 % 30.7  31.4  32.2   Platelets 150 - 400 K/uL 269   248  249       Latest Ref Rng & Units 07/15/2023   10:10 AM 05/22/2023    3:52 PM 12/16/2022    9:26 AM  CMP  Glucose 70 - 99 mg/dL 815  862  852   BUN 8 - 23 mg/dL 48  51  46   Creatinine 0.61 - 1.24 mg/dL 7.47  7.15  7.20   Sodium 135 - 145 mmol/L 136  138  138   Potassium 3.5 - 5.1 mmol/L 4.6  5.1  4.9   Chloride 98 - 111 mmol/L 104  103  102   CO2 22 - 32 mmol/L 18  21  22    Calcium  8.9 - 10.3 mg/dL 9.6  9.4  9.7   Total Protein 6.5 - 8.1 g/dL 7.1     Total Bilirubin <1.2 mg/dL 0.4     Alkaline Phos 38 - 126 U/L 175     AST 15 - 41 U/L 38     ALT 0 - 44 U/L 24        Lab Results  Component Value Date   TOTALPROTELP 6.5 03/27/2022   ALBUMINELP 3.4 03/27/2022   A1GS 0.2 03/27/2022   A2GS 0.6 03/27/2022   BETS 0.8 03/27/2022   GAMS 1.4 03/27/2022   MSPIKE Not Observed 03/27/2022   SPEI Comment 03/27/2022   Lab Results  Component Value Date   TIBC 298 02/15/2024   TIBC 337 11/09/2023   TIBC 287 07/15/2023   FERRITIN 170 02/15/2024   FERRITIN 142 11/09/2023   FERRITIN 142 07/15/2023   IRONPCTSAT 17 (L) 02/15/2024   IRONPCTSAT 24 11/09/2023   IRONPCTSAT 30 07/15/2023    Latest Reference Range & Units 02/15/24 10:33  Iron 45 - 182 ug/dL 52  UIBC ug/dL 753  TIBC 749 - 549 ug/dL 701  Saturation Ratios 17.9 - 39.5 % 17 (L)  (L): Data is abnormally low  Lab Results  Component Value Date   LDH 201 (H) 03/27/2022       Component Value Date/Time   TOTALPROTELP 6.5 03/27/2022 1453   ALBUMINELP 3.4 03/27/2022 1453   A1GS 0.2 03/27/2022 1453   A2GS 0.6 03/27/2022 1453   BETS 0.8 03/27/2022 1453   GAMS 1.4 03/27/2022 1453   MSPIKE Not Observed 03/27/2022 1453   SPEI Comment 03/27/2022 1453   LDH 201 (H) 03/27/2022 1453    Review Flowsheet  More data exists  Latest Ref Rng & Units 07/15/2023 11/09/2023 02/15/2024  Oncology Labs  Ferritin 24 - 336 ng/mL 142  142  170   %SAT 17.9 - 39.5 % 30  24  17       STUDIES:  No results found.    ASSESSMENT &  PLAN:   Assessment/Plan:  88 y.o. male with anemia due to chronic kidney disease, for which he receives epoetin  alpha every 4 weeks as needed for hemoglobin below 10.  His hemoglobin is 9.7 today, so will resume epoetin  alpha today. His systolic blood pressure is slightly elevated today, which it has been commonly in the past, so may represent white coat syndrome.  He will continue to manage his blood pressure with his home readings as previous.  His CBC will continue to be checked every 4 weeks and epoetin  alpha given to keep his hemoglobin at or over 10.  I will plan to see him back in 3 months for repeat clinical assessment. The patient understands all the plans discussed today and is in agreement with them.  He knows to contact our office if he develops concerns prior to his next appointment.     Andrez DELENA Foy, PA-C   Physician Assistant [facility] [place] [phone]   Addendum: When the patient saw the nurse for his injection, he was briefly unresponsive but awake.  Prior to that he said that he was feeling funny.  The nurse was concerned he may have had a seizure.  His blood pressure was found to be 66/43, so he was replaced in a reclined position, IV fluids were started and EMS was contacted.  He rapidly improved to baseline.  He stated he took nitroglycerin  to reduce his blood pressure prior to getting his injection, as he was worried we would not proceed with the injection.  He declined transportation to the hospital for further evaluation.  He was instructed not to use nitroglycerin  for elevated blood pressure.  We proceeded with epoetin  alpha today.

## 2024-02-15 NOTE — Telephone Encounter (Signed)
 Patient has been scheduled for follow-up visit per 02/15/24 LOS.  Pt aware of scheduled appt details.

## 2024-02-15 NOTE — Patient Instructions (Signed)

## 2024-02-18 DIAGNOSIS — H353212 Exudative age-related macular degeneration, right eye, with inactive choroidal neovascularization: Secondary | ICD-10-CM | POA: Diagnosis not present

## 2024-02-18 DIAGNOSIS — H353221 Exudative age-related macular degeneration, left eye, with active choroidal neovascularization: Secondary | ICD-10-CM | POA: Diagnosis not present

## 2024-02-24 DIAGNOSIS — H43813 Vitreous degeneration, bilateral: Secondary | ICD-10-CM | POA: Diagnosis not present

## 2024-02-24 DIAGNOSIS — H353221 Exudative age-related macular degeneration, left eye, with active choroidal neovascularization: Secondary | ICD-10-CM | POA: Diagnosis not present

## 2024-02-24 DIAGNOSIS — H353212 Exudative age-related macular degeneration, right eye, with inactive choroidal neovascularization: Secondary | ICD-10-CM | POA: Diagnosis not present

## 2024-03-14 ENCOUNTER — Inpatient Hospital Stay: Attending: Hematology and Oncology

## 2024-03-14 ENCOUNTER — Inpatient Hospital Stay

## 2024-03-14 DIAGNOSIS — N1832 Chronic kidney disease, stage 3b: Secondary | ICD-10-CM | POA: Insufficient documentation

## 2024-03-14 DIAGNOSIS — N183 Chronic kidney disease, stage 3 unspecified: Secondary | ICD-10-CM

## 2024-03-14 DIAGNOSIS — D631 Anemia in chronic kidney disease: Secondary | ICD-10-CM | POA: Diagnosis not present

## 2024-03-14 LAB — CBC WITH DIFFERENTIAL (CANCER CENTER ONLY)
Abs Immature Granulocytes: 0.04 K/uL (ref 0.00–0.07)
Basophils Absolute: 0.1 K/uL (ref 0.0–0.1)
Basophils Relative: 1 %
Eosinophils Absolute: 0.2 K/uL (ref 0.0–0.5)
Eosinophils Relative: 3 %
HCT: 34.9 % — ABNORMAL LOW (ref 39.0–52.0)
Hemoglobin: 11 g/dL — ABNORMAL LOW (ref 13.0–17.0)
Immature Granulocytes: 1 %
Lymphocytes Relative: 15 %
Lymphs Abs: 1.1 K/uL (ref 0.7–4.0)
MCH: 29 pg (ref 26.0–34.0)
MCHC: 31.5 g/dL (ref 30.0–36.0)
MCV: 92.1 fL (ref 80.0–100.0)
Monocytes Absolute: 0.8 K/uL (ref 0.1–1.0)
Monocytes Relative: 10 %
Neutro Abs: 5.6 K/uL (ref 1.7–7.7)
Neutrophils Relative %: 70 %
Platelet Count: 272 K/uL (ref 150–400)
RBC: 3.79 MIL/uL — ABNORMAL LOW (ref 4.22–5.81)
RDW: 13.7 % (ref 11.5–15.5)
WBC Count: 7.9 K/uL (ref 4.0–10.5)
nRBC: 0 % (ref 0.0–0.2)

## 2024-03-14 NOTE — Progress Notes (Signed)
 1049 HELD SHOT TODAY HEMOGLOBIN AT 11.0. NEXT APPTS GIVEN TO THE PATIENT AND WIFE.

## 2024-03-22 ENCOUNTER — Telehealth: Payer: Self-pay | Admitting: *Deleted

## 2024-03-22 NOTE — Telephone Encounter (Signed)
   Primary Cardiologist: Redell Leiter, MD  Chart reviewed as part of pre-operative protocol coverage. Simple dental extractions (1-2 teeth) are considered low risk procedures per guidelines and generally do not require any specific cardiac clearance. It is also generally accepted that for simple extractions and dental cleanings, there is no need to interrupt blood thinner therapy.   SBE prophylaxis is not required for the patient.  I will route this recommendation to the requesting party via Epic fax function and remove from pre-op pool.  Please call with questions.  Rosaline EMERSON Bane, NP-C 03/22/2024, 3:43 PM [address] [place], [place] [address] Office [phone] Fax [phone]

## 2024-03-22 NOTE — Telephone Encounter (Signed)
   Pre-operative Risk Assessment    Patient Name: Troy [name]  DOB: [birth year] MRN: [id number]     Request for Surgical Clearance    Procedure:  Dental Extraction - Amount of Teeth to be Pulled:  1  Date of Surgery:  Clearance 04/07/24                                 Surgeon:  Daved Maier, DDS Surgeon's Group or Practice Name:  [facility] Phone number:  [phone] Fax number:  [phone]   Type of Clearance Requested:   - Pharmacy:  Hold Clopidogrel  (Plavix ) Need to know protocol for dental extractions   Type of Anesthesia:  Not Indicated   Additional requests/questions:  PLease evaluate this patient's medical history and advise us  of any special considerations that should be made.  Signed, Arloa Donovan Dines   03/22/2024, 2:22 PM

## 2024-03-29 DIAGNOSIS — Z23 Encounter for immunization: Secondary | ICD-10-CM | POA: Diagnosis not present

## 2024-04-04 DIAGNOSIS — D485 Neoplasm of uncertain behavior of skin: Secondary | ICD-10-CM | POA: Diagnosis not present

## 2024-04-04 DIAGNOSIS — L570 Actinic keratosis: Secondary | ICD-10-CM | POA: Diagnosis not present

## 2024-04-05 ENCOUNTER — Encounter: Payer: Self-pay | Admitting: Hematology and Oncology

## 2024-04-05 ENCOUNTER — Other Ambulatory Visit (HOSPITAL_COMMUNITY): Payer: Self-pay

## 2024-04-11 ENCOUNTER — Inpatient Hospital Stay

## 2024-04-11 ENCOUNTER — Inpatient Hospital Stay: Attending: Hematology and Oncology

## 2024-04-11 DIAGNOSIS — N1832 Chronic kidney disease, stage 3b: Secondary | ICD-10-CM | POA: Insufficient documentation

## 2024-04-11 DIAGNOSIS — D631 Anemia in chronic kidney disease: Secondary | ICD-10-CM | POA: Insufficient documentation

## 2024-04-11 LAB — CBC WITH DIFFERENTIAL (CANCER CENTER ONLY)
Abs Immature Granulocytes: 0.05 K/uL (ref 0.00–0.07)
Basophils Absolute: 0.1 K/uL (ref 0.0–0.1)
Basophils Relative: 1 %
Eosinophils Absolute: 0.3 K/uL (ref 0.0–0.5)
Eosinophils Relative: 3 %
HCT: 33.4 % — ABNORMAL LOW (ref 39.0–52.0)
Hemoglobin: 10.9 g/dL — ABNORMAL LOW (ref 13.0–17.0)
Immature Granulocytes: 1 %
Lymphocytes Relative: 17 %
Lymphs Abs: 1.4 K/uL (ref 0.7–4.0)
MCH: 29.5 pg (ref 26.0–34.0)
MCHC: 32.6 g/dL (ref 30.0–36.0)
MCV: 90.5 fL (ref 80.0–100.0)
Monocytes Absolute: 0.8 K/uL (ref 0.1–1.0)
Monocytes Relative: 10 %
Neutro Abs: 5.6 K/uL (ref 1.7–7.7)
Neutrophils Relative %: 68 %
Platelet Count: 267 K/uL (ref 150–400)
RBC: 3.69 MIL/uL — ABNORMAL LOW (ref 4.22–5.81)
RDW: 13.4 % (ref 11.5–15.5)
WBC Count: 8.2 K/uL (ref 4.0–10.5)
nRBC: 0 % (ref 0.0–0.2)

## 2024-04-13 DIAGNOSIS — C44329 Squamous cell carcinoma of skin of other parts of face: Secondary | ICD-10-CM | POA: Diagnosis not present

## 2024-04-14 ENCOUNTER — Telehealth: Payer: Self-pay

## 2024-04-14 NOTE — Telephone Encounter (Signed)
 2026 Renewal  PAP: Patient assistance application for Farxiga through [employer] ([employer]) has been mailed to pt's home address on file. Provider portion of application will be faxed to provider's office.  PAP: Patient assistance application for Creon  through [employer]) has been mailed to pt's home address on file. Provider portion of application will be faxed to provider's office.  PAP: Patient assistance application for Basaglar  through [employer] has been mailed to pt's home address on file. Provider portion of application will be faxed to provider's office.  Provider portion of applications will be faxed to Dr. Medford Rubens at [facility]

## 2024-04-18 DIAGNOSIS — Z23 Encounter for immunization: Secondary | ICD-10-CM | POA: Diagnosis not present

## 2024-04-28 DIAGNOSIS — H353232 Exudative age-related macular degeneration, bilateral, with inactive choroidal neovascularization: Secondary | ICD-10-CM | POA: Diagnosis not present

## 2024-05-03 ENCOUNTER — Other Ambulatory Visit (HOSPITAL_COMMUNITY): Payer: Self-pay

## 2024-05-03 NOTE — Telephone Encounter (Signed)
 Received patient portion of patient assistance applications for Creon , farxiga and basaglar 

## 2024-05-09 ENCOUNTER — Encounter: Payer: Self-pay | Admitting: Hematology and Oncology

## 2024-05-09 ENCOUNTER — Inpatient Hospital Stay: Attending: Hematology and Oncology

## 2024-05-09 ENCOUNTER — Other Ambulatory Visit: Payer: Self-pay

## 2024-05-09 ENCOUNTER — Inpatient Hospital Stay: Admitting: Hematology and Oncology

## 2024-05-09 ENCOUNTER — Inpatient Hospital Stay

## 2024-05-09 VITALS — BP 153/58 | HR 70 | Temp 97.4°F | Resp 16 | Ht 70.0 in | Wt 149.9 lb

## 2024-05-09 DIAGNOSIS — N189 Chronic kidney disease, unspecified: Secondary | ICD-10-CM | POA: Diagnosis not present

## 2024-05-09 DIAGNOSIS — N183 Chronic kidney disease, stage 3 unspecified: Secondary | ICD-10-CM

## 2024-05-09 DIAGNOSIS — D631 Anemia in chronic kidney disease: Secondary | ICD-10-CM | POA: Insufficient documentation

## 2024-05-09 LAB — CBC WITH DIFFERENTIAL (CANCER CENTER ONLY)
Abs Immature Granulocytes: 0.02 K/uL (ref 0.00–0.07)
Basophils Absolute: 0.1 K/uL (ref 0.0–0.1)
Basophils Relative: 1 %
Eosinophils Absolute: 0.3 K/uL (ref 0.0–0.5)
Eosinophils Relative: 4 %
HCT: 31.4 % — ABNORMAL LOW (ref 39.0–52.0)
Hemoglobin: 10.3 g/dL — ABNORMAL LOW (ref 13.0–17.0)
Immature Granulocytes: 0 %
Lymphocytes Relative: 15 %
Lymphs Abs: 1.1 K/uL (ref 0.7–4.0)
MCH: 29.3 pg (ref 26.0–34.0)
MCHC: 32.8 g/dL (ref 30.0–36.0)
MCV: 89.5 fL (ref 80.0–100.0)
Monocytes Absolute: 0.7 K/uL (ref 0.1–1.0)
Monocytes Relative: 9 %
Neutro Abs: 5.3 K/uL (ref 1.7–7.7)
Neutrophils Relative %: 71 %
Platelet Count: 262 K/uL (ref 150–400)
RBC: 3.51 MIL/uL — ABNORMAL LOW (ref 4.22–5.81)
RDW: 13.7 % (ref 11.5–15.5)
WBC Count: 7.4 K/uL (ref 4.0–10.5)
nRBC: 0 % (ref 0.0–0.2)

## 2024-05-09 NOTE — Progress Notes (Signed)
 [facility]  [address] [place],  [place]  [address] [phone]  Clinic Day:  05/09/2024  Referring physician: Rusty [name], *   HISTORY OF PRESENT ILLNESS:  The patient is a 88 y.o. male with anemia of chronic kidney disease. He received epoetin  alpha injections every 4 weeks. Each time he has had an increase in his hemoglobin. He was then admitted in April 2024 with a NSTEMI. Cardiac catheterization revealed in-stent restenosis treated with balloon angioplasty. He was referred back in December 2024 by Dr. Monetta, when his hemoglobin dropped to 9.5. We resumed epoetin  alpha in December and he had a good response.  He continues epoetin  every 4 weeks as needed to keep his hemoglobin at or above 10 due to his cardiac disease.  He is here today for repeat clinical assessment and states he has been doing well.  He denies progressive fatigue concerning for worsening anemia.  He reports shortness of breath with exertion.  He has easy bruising, but otherwise denies any overt form of blood loss. He reports occasional chest pain, for which nitroglycerin  is effective.  He denies chest pain today.  He did not take his blood pressure medications this morning as it was normal at home.  He states he takes his blood pressure at home 3 times daily and is taking his blood pressure medication as needed.  He will repeat his blood pressure at home and if it remains high, plan to take his blood pressure medication.    PHYSICAL EXAM:  Blood pressure (!) 153/58, pulse 70, temperature (!) 97.4 F (36.3 C), temperature source Oral, resp. rate 16, height 5' 10 (1.778 m), weight 149 lb 14.4 oz (68 kg), SpO2 96%. Wt Readings from Last 3 Encounters:  05/09/24 149 lb 14.4 oz (68 kg)  02/15/24 153 lb 9.6 oz (69.7 kg)  01/05/24 152 lb (68.9 kg)   Body mass index is 21.51 kg/m.  Performance status (ECOG): 1 - Symptomatic but completely ambulatory  Physical Exam Vitals and nursing note  reviewed.  Constitutional:      General: He is not in acute distress.    Appearance: Normal appearance. He is normal weight.  HENT:     Head: Normocephalic and atraumatic.     Mouth/Throat:     Mouth: Mucous membranes are moist.     Pharynx: Oropharynx is clear. No oropharyngeal exudate or posterior oropharyngeal erythema.  Eyes:     General: No scleral icterus.    Extraocular Movements: Extraocular movements intact.     Conjunctiva/sclera: Conjunctivae normal.     Pupils: Pupils are equal, round, and reactive to light.  Cardiovascular:     Rate and Rhythm: Normal rate and regular rhythm.     Heart sounds: Murmur heard.     Systolic murmur is present with a grade of 3/6.     No friction rub. No gallop.  Pulmonary:     Effort: Pulmonary effort is normal.     Breath sounds: Normal breath sounds. No wheezing, rhonchi or rales.  Abdominal:     General: Bowel sounds are normal. There is no distension.     Palpations: Abdomen is soft. There is no mass.     Tenderness: There is no abdominal tenderness.  Musculoskeletal:        General: Normal range of motion.     Cervical back: Normal range of motion and neck supple. No tenderness.     Right lower leg: No edema.  Left lower leg: No edema.  Lymphadenopathy:     Cervical: No cervical adenopathy.  Skin:    General: Skin is warm and dry.     Coloration: Skin is not jaundiced.     Findings: No rash.  Neurological:     Mental Status: He is alert and oriented to person, place, and time.     Cranial Nerves: No cranial nerve deficit.  Psychiatric:        Mood and Affect: Mood normal.        Behavior: Behavior normal.        Thought Content: Thought content normal.     LABS:      Latest Ref Rng & Units 05/09/2024    9:59 AM 04/11/2024   10:00 AM 03/14/2024   10:07 AM  CBC  WBC 4.0 - 10.5 K/uL 7.4  8.2  7.9   Hemoglobin 13.0 - 17.0 g/dL 89.6  89.0  88.9   Hematocrit 39.0 - 52.0 % 31.4  33.4  34.9   Platelets 150 - 400 K/uL  262  267  272       Latest Ref Rng & Units 07/15/2023   10:10 AM 05/22/2023    3:52 PM 12/16/2022    9:26 AM  CMP  Glucose 70 - 99 mg/dL 815  862  852   BUN 8 - 23 mg/dL 48  51  46   Creatinine 0.61 - 1.24 mg/dL 7.47  7.15  7.20   Sodium 135 - 145 mmol/L 136  138  138   Potassium 3.5 - 5.1 mmol/L 4.6  5.1  4.9   Chloride 98 - 111 mmol/L 104  103  102   CO2 22 - 32 mmol/L 18  21  22    Calcium  8.9 - 10.3 mg/dL 9.6  9.4  9.7   Total Protein 6.5 - 8.1 g/dL 7.1     Total Bilirubin <1.2 mg/dL 0.4     Alkaline Phos 38 - 126 U/L 175     AST 15 - 41 U/L 38     ALT 0 - 44 U/L 24        Lab Results  Component Value Date   TOTALPROTELP 6.5 03/27/2022   ALBUMINELP 3.4 03/27/2022   A1GS 0.2 03/27/2022   A2GS 0.6 03/27/2022   BETS 0.8 03/27/2022   GAMS 1.4 03/27/2022   MSPIKE Not Observed 03/27/2022   SPEI Comment 03/27/2022   Lab Results  Component Value Date   TIBC 298 02/15/2024   TIBC 337 11/09/2023   TIBC 287 07/15/2023   FERRITIN 170 02/15/2024   FERRITIN 142 11/09/2023   FERRITIN 142 07/15/2023   IRONPCTSAT 17 (L) 02/15/2024   IRONPCTSAT 24 11/09/2023   IRONPCTSAT 30 07/15/2023    Latest Reference Range & Units 02/15/24 10:33  Iron 45 - 182 ug/dL 52  UIBC ug/dL 753  TIBC 749 - 549 ug/dL 701  Saturation Ratios 17.9 - 39.5 % 17 (L)  (L): Data is abnormally low  Lab Results  Component Value Date   LDH 201 (H) 03/27/2022       Component Value Date/Time   TOTALPROTELP 6.5 03/27/2022 1453   ALBUMINELP 3.4 03/27/2022 1453   A1GS 0.2 03/27/2022 1453   A2GS 0.6 03/27/2022 1453   BETS 0.8 03/27/2022 1453   GAMS 1.4 03/27/2022 1453   MSPIKE Not Observed 03/27/2022 1453   SPEI Comment 03/27/2022 1453   LDH 201 (H) 03/27/2022 1453    Review Flowsheet  More data exists  Latest Ref Rng & Units 07/15/2023 11/09/2023 02/15/2024  Oncology Labs  Ferritin 24 - 336 ng/mL 142  142  170   %SAT 17.9 - 39.5 % 30  24  17       STUDIES:  No results found.     ASSESSMENT & PLAN:   Assessment/Plan:  88 y.o. male with anemia due to chronic kidney disease, for which he receives epoetin  alpha every 4 weeks as needed for hemoglobin below 10.  His hemoglobin is 10.3 today, so will hold epoetin  alpha today. He will continue to manage his blood pressure with his home readings as previous.  His CBC will continue to be checked every 4 weeks and epoetin  alpha given to keep his hemoglobin at or over 10.  I will plan to see him back in 3 months for repeat clinical assessment. The patient understands all the plans discussed today and is in agreement with them.  He knows to contact our office if he develops concerns prior to his next appointment.     Eleanor DELENA Bach, NP   Physician Assistant [facility] [place] [phone]

## 2024-05-12 ENCOUNTER — Ambulatory Visit: Admitting: Podiatry

## 2024-05-19 ENCOUNTER — Ambulatory Visit: Admitting: Podiatry

## 2024-05-19 DIAGNOSIS — M79674 Pain in right toe(s): Secondary | ICD-10-CM

## 2024-05-19 DIAGNOSIS — B351 Tinea unguium: Secondary | ICD-10-CM | POA: Diagnosis not present

## 2024-05-19 DIAGNOSIS — M79675 Pain in left toe(s): Secondary | ICD-10-CM | POA: Diagnosis not present

## 2024-05-19 NOTE — Progress Notes (Signed)
 Subjective:  Patient ID: Troy [name], male    DOB: [birth year],  MRN: [id number]  Troy [name] presents to clinic today for:   Patient notes nails are thick, discolored, elongated and painful in shoegear when trying to ambulate.    PCP is Street, Troy HERO, MD.  Past Medical History:  Diagnosis Date   Anemia 12/09/2015   Anemia due to stage 3 chronic kidney disease (HCC) 04/30/2022   Aortic stenosis, mild 01/15/2021   Atherosclerosis    Benign hypertension with chronic kidney disease, stage III (HCC) 01/15/2021   Benign hypertension with chronic kidney disease, stage IV (HCC) 01/15/2021   Bradycardia 10/01/2020   CAD in native artery 12/09/2015   Overview:  PCI/ stent 2001   Chronic diastolic heart failure (HCC) 12/09/2015   Chronic pancreatitis (HCC)    CKD (chronic kidney disease)    3B   CKD (chronic kidney disease) stage 4, GFR 15-29 ml/min (HCC) 01/15/2021   Coronary artery disease involving native coronary artery of native heart with angina pectoris 12/09/2015   Overview:  PCI/ stent 2001   Coronary artery disease involving native coronary artery of native heart with unstable angina pectoris (HCC) 12/09/2015   Overview:   PCI/ stent 2001     Diabetes (HCC)    GERD (gastroesophageal reflux disease)    H/O prostate cancer 01/15/2021   Hearing loss    HFrEF (heart failure with reduced ejection fraction) (HCC) => combination hypertensive heart disease and CAD related 12/09/2015   Hyperlipidemia 12/09/2015   Hyperlipidemia associated with type 2 diabetes mellitus (HCC) 12/09/2015   Hypertensive heart disease with heart failure (HCC) 12/09/2015   Lung nodule 11/05/2020   LVH (left ventricular hypertrophy) 02/10/2022   Macular degeneration    legally blind   MI (myocardial infarction) (HCC)    Necrotizing pancreatitis 06/26/2015   NSTEMI (non-ST elevated myocardial infarction) (HCC) 02/10/2022   Pain due to onychomycosis of toenails of both feet 01/07/2023    Prostate cancer (HCC)    radiation treatments here   Pseudocyst of pancreas 06/26/2015   PVD (peripheral vascular disease)    Second degree heart block    Type 2 diabetes mellitus with stage 4 chronic kidney disease, with long-term current use of insulin  (HCC) 01/15/2021   Past Surgical History:  Procedure Laterality Date   ABDOMINAL AORTOGRAM W/LOWER EXTREMITY Bilateral 09/10/2018   Procedure: ABDOMINAL AORTOGRAM W/LOWER EXTREMITY;  Surgeon: Harvey Carlin BRAVO, MD;  Location: [facility];  Service: Cardiovascular;  Laterality: Bilateral;   cardiac baloon surgery     11/12/2022   CHOLECYSTECTOMY     CORONARY STENT INTERVENTION N/A 02/14/2022   Procedure: CORONARY STENT INTERVENTION;  Surgeon: Mady Lonni, MD;  Location: [facility];  Service: Cardiovascular;  Laterality: N/A;   CORONARY ULTRASOUND/IVUS N/A 02/14/2022   Procedure: Intravascular Ultrasound/IVUS;  Surgeon: Mady Lonni, MD;  Location: [facility];  Service: Cardiovascular;  Laterality: N/A;   LEFT HEART CATH N/A 02/14/2022   Procedure: Left Heart Cath;  Surgeon: Mady Lonni, MD;  Location: [facility];  Service: Cardiovascular;  Laterality: N/A;   LEFT HEART CATH AND CORONARY ANGIOGRAPHY N/A 02/12/2022   Procedure: LEFT HEART CATH AND CORONARY ANGIOGRAPHY;  Surgeon: Darron Deatrice LABOR, MD;  Location: [facility];  Service: Cardiovascular;  Laterality: N/A;   PERIPHERAL VASCULAR INTERVENTION Right 09/10/2018   Procedure: PERIPHERAL VASCULAR INTERVENTION;  Surgeon: Harvey Carlin BRAVO, MD;  Location: [facility];  Service: Cardiovascular;  Laterality: Right;  common iliac   TONSILLECTOMY AND ADENOIDECTOMY     TOTAL KNEE REVISION Bilateral    Allergies  Allergen Reactions   Isordil  [Isosorbide  Nitrate]    Review of Systems: Negative except as noted in the HPI.  Objective:  Troy [name] is a pleasant 88 y.o. male in NAD. AAO x 3.  Vascular Examination: Capillary  refill time is 3-5 seconds to toes bilateral. Palpable pedal pulses b/l LE. Digital hair present b/l.  Skin temperature gradient WNL b/l. No varicosities b/l. No cyanosis noted b/l.   Dermatological Examination: Pedal skin with normal turgor, texture and tone b/l. No open wounds. No interdigital macerations b/l. Toenails x10 are 3mm thick, discolored, dystrophic with subungual debris. There is pain with compression of the nail plates.  They are elongated x10  Assessment/Plan: 1. Pain due to onychomycosis of toenails of both feet    The mycotic toenails were sharply debrided x10 with sterile nail nippers and a power debriding burr to decrease bulk/thickness and length.    Return in about 3 months (around 08/19/2024) for [facility].   Awanda CHARM [place], DPM, FACFAS [facility]     [address] [place], [place] [address]                Office [phone]  Fax [phone]

## 2024-06-06 ENCOUNTER — Inpatient Hospital Stay

## 2024-06-06 ENCOUNTER — Other Ambulatory Visit: Payer: Self-pay | Admitting: Pharmacist

## 2024-06-06 ENCOUNTER — Inpatient Hospital Stay: Attending: Hematology and Oncology

## 2024-06-06 VITALS — BP 166/56 | HR 64 | Temp 97.7°F | Resp 18 | Ht 70.0 in

## 2024-06-06 DIAGNOSIS — N184 Chronic kidney disease, stage 4 (severe): Secondary | ICD-10-CM | POA: Insufficient documentation

## 2024-06-06 DIAGNOSIS — N183 Chronic kidney disease, stage 3 unspecified: Secondary | ICD-10-CM

## 2024-06-06 DIAGNOSIS — D631 Anemia in chronic kidney disease: Secondary | ICD-10-CM | POA: Insufficient documentation

## 2024-06-06 DIAGNOSIS — N189 Chronic kidney disease, unspecified: Secondary | ICD-10-CM | POA: Insufficient documentation

## 2024-06-06 LAB — CBC WITH DIFFERENTIAL (CANCER CENTER ONLY)
Abs Immature Granulocytes: 0.05 K/uL (ref 0.00–0.07)
Basophils Absolute: 0.1 K/uL (ref 0.0–0.1)
Basophils Relative: 1 %
Eosinophils Absolute: 0.3 K/uL (ref 0.0–0.5)
Eosinophils Relative: 3 %
HCT: 31.1 % — ABNORMAL LOW (ref 39.0–52.0)
Hemoglobin: 10 g/dL — ABNORMAL LOW (ref 13.0–17.0)
Immature Granulocytes: 1 %
Lymphocytes Relative: 13 %
Lymphs Abs: 1.2 K/uL (ref 0.7–4.0)
MCH: 28.7 pg (ref 26.0–34.0)
MCHC: 32.2 g/dL (ref 30.0–36.0)
MCV: 89.4 fL (ref 80.0–100.0)
Monocytes Absolute: 0.8 K/uL (ref 0.1–1.0)
Monocytes Relative: 9 %
Neutro Abs: 6.4 K/uL (ref 1.7–7.7)
Neutrophils Relative %: 73 %
Platelet Count: 263 K/uL (ref 150–400)
RBC: 3.48 MIL/uL — ABNORMAL LOW (ref 4.22–5.81)
RDW: 15 % (ref 11.5–15.5)
WBC Count: 8.7 K/uL (ref 4.0–10.5)
nRBC: 0 % (ref 0.0–0.2)

## 2024-06-06 MED ORDER — DARBEPOETIN ALFA 100 MCG/0.5ML IJ SOSY
100.0000 ug | PREFILLED_SYRINGE | Freq: Once | INTRAMUSCULAR | Status: AC
  Administered 2024-06-06: 100 ug via SUBCUTANEOUS
  Filled 2024-06-06: qty 0.5

## 2024-06-06 NOTE — Patient Instructions (Signed)

## 2024-06-14 NOTE — Telephone Encounter (Signed)
 PAP: Application for Basaglar  has been submitted to [employer], via fax   PAP: Application for Creon  has been submitted to [employer]), via fax   PAP: Application for Doreen has been submitted to [employer] ([employer]), via fax

## 2024-06-17 NOTE — Telephone Encounter (Signed)
 PAP: Patient assistance application for Farxiga has been approved by PAP Companies: [employer] from 07/28/2024 to 07/27/2025. Medication should be delivered to PAP Delivery: Home. For further shipping updates, please contact [employer] ([employer]) at [phone]. Patient ID is: [id number]

## 2024-07-04 ENCOUNTER — Inpatient Hospital Stay

## 2024-07-04 ENCOUNTER — Inpatient Hospital Stay: Attending: Hematology and Oncology

## 2024-07-04 DIAGNOSIS — D631 Anemia in chronic kidney disease: Secondary | ICD-10-CM | POA: Diagnosis present

## 2024-07-04 DIAGNOSIS — N184 Chronic kidney disease, stage 4 (severe): Secondary | ICD-10-CM | POA: Insufficient documentation

## 2024-07-04 DIAGNOSIS — N183 Chronic kidney disease, stage 3 unspecified: Secondary | ICD-10-CM

## 2024-07-04 LAB — CBC WITH DIFFERENTIAL (CANCER CENTER ONLY)
Abs Immature Granulocytes: 0.02 K/uL (ref 0.00–0.07)
Basophils Absolute: 0.1 K/uL (ref 0.0–0.1)
Basophils Relative: 1 %
Eosinophils Absolute: 0.2 K/uL (ref 0.0–0.5)
Eosinophils Relative: 2 %
HCT: 33 % — ABNORMAL LOW (ref 39.0–52.0)
Hemoglobin: 10.5 g/dL — ABNORMAL LOW (ref 13.0–17.0)
Immature Granulocytes: 0 %
Lymphocytes Relative: 13 %
Lymphs Abs: 1.1 K/uL (ref 0.7–4.0)
MCH: 28.9 pg (ref 26.0–34.0)
MCHC: 31.8 g/dL (ref 30.0–36.0)
MCV: 90.9 fL (ref 80.0–100.0)
Monocytes Absolute: 0.9 K/uL (ref 0.1–1.0)
Monocytes Relative: 11 %
Neutro Abs: 6.2 K/uL (ref 1.7–7.7)
Neutrophils Relative %: 73 %
Platelet Count: 228 K/uL (ref 150–400)
RBC: 3.63 MIL/uL — ABNORMAL LOW (ref 4.22–5.81)
RDW: 14.3 % (ref 11.5–15.5)
WBC Count: 8.5 K/uL (ref 4.0–10.5)
nRBC: 0 % (ref 0.0–0.2)

## 2024-07-04 NOTE — Telephone Encounter (Signed)
 PAP: Patient assistance application for Creon  has been approved by PAP Companies: [employer] from 07/28/2024 to 07/27/2025. Medication should be delivered to PAP Delivery: Home. For further shipping updates, please contact [employer] ([employer]) at [phone]. Patient ID is: No ID given-per [employer] rep

## 2024-07-04 NOTE — Telephone Encounter (Signed)
 PAP: Patient assistance application for Basaglar  has been approved by PAP Companies: [employer] from 07/28/2024 to 07/27/2025. Medication should be delivered to PAP Delivery: Home. For further shipping updates, please contact [employer] at [phone]. Patient ID is: [id number]

## 2024-07-04 NOTE — Progress Notes (Signed)
 HEMOGLOBIN AT 10.5, HELD SHOT TODAY. PATIENT GIVEN HIS NEXT SHOT APPT.

## 2024-07-07 DIAGNOSIS — H353212 Exudative age-related macular degeneration, right eye, with inactive choroidal neovascularization: Secondary | ICD-10-CM | POA: Diagnosis not present

## 2024-07-07 DIAGNOSIS — H353221 Exudative age-related macular degeneration, left eye, with active choroidal neovascularization: Secondary | ICD-10-CM | POA: Diagnosis not present

## 2024-07-12 DIAGNOSIS — I5022 Chronic systolic (congestive) heart failure: Secondary | ICD-10-CM | POA: Diagnosis not present

## 2024-07-12 DIAGNOSIS — I110 Hypertensive heart disease with heart failure: Secondary | ICD-10-CM | POA: Diagnosis not present

## 2024-07-12 DIAGNOSIS — E1122 Type 2 diabetes mellitus with diabetic chronic kidney disease: Secondary | ICD-10-CM | POA: Diagnosis not present

## 2024-07-12 DIAGNOSIS — N184 Chronic kidney disease, stage 4 (severe): Secondary | ICD-10-CM | POA: Diagnosis not present

## 2024-07-14 DIAGNOSIS — I7090 Unspecified atherosclerosis: Secondary | ICD-10-CM | POA: Insufficient documentation

## 2024-07-14 DIAGNOSIS — N189 Chronic kidney disease, unspecified: Secondary | ICD-10-CM | POA: Insufficient documentation

## 2024-07-14 NOTE — Progress Notes (Unsigned)
 Cardiology Office Note:    Date:  07/14/2024   ID:  Troy [name], DOB 05/30/1936, MRN [id number]  PCP:  [name], Troy HERO, MD  Cardiologist:  Troy Leiter, MD    Referring MD: [name], Troy HERO, MD    ASSESSMENT:    1. CAD in native artery   2. CKD (chronic kidney disease) stage 4, GFR 15-29 ml/min (HCC)   3. Hypertensive heart disease with heart failure (HCC)   4. Mobitz (type) I (Wenckebach's) atrioventricular block   5. Sinus bradycardia on ECG   6. Pure hypercholesterolemia   7. Anemia due to stage 3b chronic kidney disease (HCC)    PLAN:    In order of problems listed above:  ***   Next appointment: ***   Medication Adjustments/Labs and Tests Ordered: Current medicines are reviewed at length with the patient today.  Concerns regarding medicines are outlined above.  No orders of the defined types were placed in this encounter.  No orders of the defined types were placed in this encounter.    History of Present Illness:    Troy [name] is a 88 y.o. male with a hx of CAD with PCI and multiple stent right coronary artery April 2024 hypertensive heart disease of heart failure and stage IV CKD with iron deficiency anemia anemia related to kidney disease hyperlipidemia sinus bradycardia Mobitz 1 second-degree AV block and moderate aortic stenosis and mild aortic regurgitation last seen 09/22/2023. Compliance with diet, lifestyle and medications: *** Past Medical History:  Diagnosis Date   Anemia 12/09/2015   Anemia due to stage 3 chronic kidney disease (HCC) 04/30/2022   Aortic stenosis, mild 01/15/2021   Atherosclerosis    Benign hypertension with chronic kidney disease, stage III (HCC) 01/15/2021   Benign hypertension with chronic kidney disease, stage IV (HCC) 01/15/2021   Bradycardia 10/01/2020   CAD in native artery 12/09/2015   Overview:  PCI/ stent 2001   Chronic diastolic heart failure (HCC) 12/09/2015   Chronic pancreatitis (HCC)    CKD  (chronic kidney disease)    3B   CKD (chronic kidney disease) stage 4, GFR 15-29 ml/min (HCC) 01/15/2021   Coronary artery disease involving native coronary artery of native heart with angina pectoris 12/09/2015   Overview:  PCI/ stent 2001   Coronary artery disease involving native coronary artery of native heart with unstable angina pectoris (HCC) 12/09/2015   Overview:   PCI/ stent 2001     Diabetes (HCC)    GERD (gastroesophageal reflux disease)    H/O prostate cancer 01/15/2021   Hearing loss    HFrEF (heart failure with reduced ejection fraction) (HCC) => combination hypertensive heart disease and CAD related 12/09/2015   Hyperlipidemia 12/09/2015   Hyperlipidemia associated with type 2 diabetes mellitus (HCC) 12/09/2015   Hypertensive heart disease with heart failure (HCC) 12/09/2015   Lung nodule 11/05/2020   LVH (left ventricular hypertrophy) 02/10/2022   Macular degeneration    legally blind   MI (myocardial infarction) (HCC)    Necrotizing pancreatitis 06/26/2015   NSTEMI (non-ST elevated myocardial infarction) (HCC) 02/10/2022   Pain due to onychomycosis of toenails of both feet 01/07/2023   Prostate cancer (HCC)    radiation treatments here   Pseudocyst of pancreas 06/26/2015   PVD (peripheral vascular disease)    Second degree heart block    Type 2 diabetes mellitus with stage 4 chronic kidney disease, with long-term current use of insulin  (HCC) 01/15/2021    Current Medications: Active Medications[1]  EKGs/Labs/Other Studies Reviewed:    The following studies were reviewed today:  Cardiac Studies & Procedures   ______________________________________________________________________________________________ CARDIAC CATHETERIZATION  CARDIAC CATHETERIZATION 02/14/2022  Conclusion Conclusions: Severe single-vessel coronary artery disease involving RCA stent and flanking de-novo disease, as detailed in Dr. Renato diagnostic catheterization note from  02/12/2022. Normal left ventricular filling pressure (LVEDP 10 mmHg). Successful IVUS-guided PCI to RCA with placement of Synergy 3.5 x 12 mm (proximal) and 3.5 x 24 (distal) drug-eluting stents that overlap the previously placed stent.  The entire stented segment was postdilated to 4.1 mm with <10% residual stenosis and TIMI-3 flow.  Recommendations: Remove right femoral artery sheath when ACT has fallen below 175 seconds with manual compression. Continue indefinite dual antiplatelet therapy with aspirin  and clopidogrel . Aggressive secondary prevention. Gentle post-catheterization hydration with close monitoring of renal function.  Troy Hanson, MD [facility]  Findings Coronary Findings Diagnostic  Dominance: Right  Right Coronary Artery Ost RCA to Prox RCA lesion is 90% stenosed. The lesion was previously treated . Prox RCA to Mid RCA lesion is 50% stenosed. The lesion was previously treated using a stent (unknown type) . Previously placed stent displays restenosis. Mid RCA lesion is 60% stenosed.  Right Ventricular Branch RV Branch lesion is 90% stenosed.  Right Posterior Descending Artery RPDA lesion is 50% stenosed.  Right Posterior Atrioventricular Artery There is mild disease in the vessel.  Intervention  Ost RCA to Prox RCA lesion Stent (Also treats lesions: Prox RCA to Mid RCA) A drug-eluting stent was successfully placed using a SYNERGY XD 3.50X12. Post-Intervention Lesion Assessment The intervention was successful. Pre-interventional TIMI flow is 3. Post-intervention TIMI flow is 3. No complications occurred at this lesion. There is a 0% residual stenosis post intervention.  Prox RCA to Mid RCA lesion Stent (Also treats lesions: Ost RCA to Prox RCA) See details in Ost RCA to Prox RCA lesion. Stent (Also treats lesions: Mid RCA) A drug-eluting stent was successfully placed using a SYNERGY XD 3.50X24. Post-Intervention Lesion Assessment The intervention  was successful. Pre-interventional TIMI flow is 3. Post-intervention TIMI flow is 3. No complications occurred at this lesion. There is a 0% residual stenosis post intervention.  Mid RCA lesion Stent (Also treats lesions: Prox RCA to Mid RCA) See details in Prox RCA to Mid RCA lesion. Post-Intervention Lesion Assessment The intervention was successful. Pre-interventional TIMI flow is 3. Post-intervention TIMI flow is 3. No complications occurred at this lesion. There is a 0% residual stenosis post intervention.   CARDIAC CATHETERIZATION 02/12/2022  Conclusion   Mid Cx lesion is 30% stenosed.   Prox RCA lesion is 90% stenosed.   Prox RCA to Mid RCA lesion is 50% stenosed.   Mid RCA lesion is 60% stenosed.   RPDA lesion is 50% stenosed.  1.  Severe one-vessel coronary artery disease involving the right coronary artery.  Patent right coronary artery stents with moderate diffuse in-stent restenosis.  However, there is a 90% stenosis at the proximal edge of the stent with hazy appearance highly suggestive of plaque rupture.  In addition, there is a 60% distal edge stenosis. 2.  Moderate aortic stenosis with mean gradient of 23 mmHg. 3.  Moderately difficult procedure via the right radial artery due to significant tortuosity of the innominate and right subclavian arteries.  Recommendations: 35 mL of contrast was used for today's diagnostic procedure.  Gentle hydration postprocedure. Recommend staged RCA PCI on Friday.  I suspect that the whole length of the lesion within the previously placed stents will  need to be treated with another drug-eluting stent. Consider femoral artery access for better support given difficulty engaging the right coronary artery from the right radial artery.  Findings Coronary Findings Diagnostic  Dominance: Right  Left Main Vessel is angiographically normal.  Left Anterior Descending There is mild diffuse disease throughout the vessel.  Second Diagonal  Branch Vessel is large in size.  Left Circumflex The vessel exhibits minimal luminal irregularities. Mid Cx lesion is 30% stenosed. The lesion is mildly calcified.  First Obtuse Marginal Branch There is mild disease in the vessel.  Third Obtuse Marginal Branch Vessel is angiographically normal.  Right Coronary Artery Prox RCA lesion is 90% stenosed. The lesion was previously treated . Prox RCA to Mid RCA lesion is 50% stenosed. The lesion was previously treated using a stent (unknown type) . Previously placed stent displays restenosis. Mid RCA lesion is 60% stenosed.  Right Posterior Descending Artery RPDA lesion is 50% stenosed.  Right Posterior Atrioventricular Artery There is mild disease in the vessel.  Intervention  No interventions have been documented.   STRESS TESTS  MYOCARDIAL PERFUSION IMAGING 06/03/2023  Interpretation Summary   Findings are consistent with infarction. The study is intermediate risk.  Patient complained of significant dyspnea during the procedure which resolved into recovery.  He gave history of chest pain resolving with nitroglycerin  sublingual in the previous week of the test.   LV perfusion is abnormal. Defect 1: There is a large defect with severe reduction in uptake present in the inferior location(s) that is fixed. There is abnormal wall motion in the defect area. Consistent with infarction.   Left ventricular function is abnormal. Global function is mildly reduced. There was a single regional abnormality. Nuclear stress EF: 51%. The left ventricular ejection fraction is mildly decreased (45-54%). End diastolic cavity size is normal.   Prior study not available for comparison.   ECHOCARDIOGRAM  ECHOCARDIOGRAM COMPLETE 06/16/2023  Narrative ECHOCARDIOGRAM REPORT    Patient Name:   Troy [name] Date of Exam: 06/16/2023 Medical Rec #:  [id number]      Height:       70.0 in Accession #:    [id number]     Weight:       161.0 lb Date of  Birth:  02-05-1936      BSA:          1.903 m Patient Age:    87 years       BP:           150/80 mmHg Patient Gender: M              HR:           74 bpm. Exam Location:  [place]  Procedure: 2D Echo, Cardiac Doppler, Color Doppler and Strain Analysis  Indications:    Aortic valve stenosis, etiology of cardiac valve disease unspecified [I35.0 (ICD-10-CM)]; Chest pain of uncertain etiology [R07.9 (ICD-10-CM)]; Medication management [phone] (ICD-10-CM)]; Hypertensive heart disease with heart failure (HCC) [I11.0 (ICD-10-CM)]; CKD (chronic kidney disease) stage 4, GFR 15-29 ml/min (HCC) [N18.4 (ICD-10-CM)]  History:        Patient has prior history of Echocardiogram examinations, most recent 02/10/2022. LVH (left ventricular hypertrophy), CAD and Previous Myocardial Infarction, Signs/Symptoms:Hypertensive Heart Disease; Risk Factors:Dyslipidemia.  Sonographer:    Lynwood Silvas RDCS Referring Phys: [phone] DELON BROCKS WOODY  IMPRESSIONS   1. Left ventricular ejection fraction, by estimation, is 55 to 60%. The left ventricle has normal function. The left ventricle has no regional wall motion  abnormalities. There is mild concentric left ventricular hypertrophy. Left ventricular diastolic parameters are consistent with Grade I diastolic dysfunction (impaired relaxation). The average left ventricular global longitudinal strain is -13.1 %. The global longitudinal strain is abnormal. 2. Right ventricular systolic function is normal. The right ventricular size is normal. There is normal pulmonary artery systolic pressure. 3. Left atrial size was mildly dilated. 4. The mitral valve is degenerative. Mild to moderate mitral valve regurgitation. No evidence of mitral stenosis. 5. The aortic valve is tricuspid. There is severe calcifcation of the aortic valve. Aortic valve regurgitation is mild. Low-flow, low-gradient severe aortic stenosis with Stroke voluem 59ml. Aortic valve area, by VTI measures 0.82  cm. Aortic valve mean gradient measures 24.3 mmHg. Aortic valve Vmax measures 3.31 m/s. 6. The inferior vena cava is normal in size with greater than 50% respiratory variability, suggesting right atrial pressure of 3 mmHg. 7. Technically difficult study with Doppler assessment of AV peak velocity likely underestimated.  Comparison(s): A prior study was performed on 02/10/2022. No obvious wall motion abnormality on this study.  Conclusion(s)/Recommendation(s): Consider further evaluation of AV with alteranate diagnostic modalities such as TEE or CT.  FINDINGS Left Ventricle: Left ventricular ejection fraction, by estimation, is 55 to 60%. The left ventricle has normal function. The left ventricle has no regional wall motion abnormalities. The average left ventricular global longitudinal strain is -13.1 %. The global longitudinal strain is abnormal. The left ventricular internal cavity size was normal in size. There is mild concentric left ventricular hypertrophy. Left ventricular diastolic parameters are consistent with Grade I diastolic dysfunction (impaired relaxation).  Right Ventricle: The right ventricular size is normal. Right vetricular wall thickness was not well visualized. Right ventricular systolic function is normal. There is normal pulmonary artery systolic pressure. The tricuspid regurgitant velocity is 2.44 m/s, and with an assumed right atrial pressure of 3 mmHg, the estimated right ventricular systolic pressure is 26.8 mmHg.  Left Atrium: Left atrial size was mildly dilated.  Right Atrium: Right atrial size was normal in size.  Pericardium: There is no evidence of pericardial effusion.  Mitral Valve: The mitral valve is degenerative in appearance. There is mild thickening of the mitral valve leaflet(s). Mild mitral annular calcification. Mild to moderate mitral valve regurgitation. No evidence of mitral valve stenosis.  Tricuspid Valve: The tricuspid valve is not well  visualized. Tricuspid valve regurgitation is mild . No evidence of tricuspid stenosis.  Aortic Valve: The aortic valve is tricuspid. There is severe calcifcation of the aortic valve. Aortic valve regurgitation is mild. Aortic regurgitation PHT measures 351 msec. Low-flow, low-gradient severe aortic stenosis with Stroke voluem 59ml. Aortic valve mean gradient measures 24.3 mmHg. Aortic valve peak gradient measures 43.8 mmHg. Aortic valve area, by VTI measures 0.82 cm.  Pulmonic Valve: The pulmonic valve was not well visualized. Pulmonic valve regurgitation is not visualized.  Aorta: The aortic root and ascending aorta are structurally normal, with no evidence of dilitation.  Venous: The inferior vena cava is normal in size with greater than 50% respiratory variability, suggesting right atrial pressure of 3 mmHg.  IAS/Shunts: The interatrial septum was not well visualized.   LEFT VENTRICLE PLAX 2D LVIDd:         4.80 cm   Diastology LVIDs:         3.80 cm   LV e' medial:    3.94 cm/s LV PW:         1.20 cm   LV E/e' medial:  21.7 LV IVS:  1.35 cm   LV e' lateral:   10.60 cm/s LVOT diam:     2.00 cm   LV E/e' lateral: 8.1 LV SV:         58 LV SV Index:   31        2D Longitudinal Strain LVOT Area:     3.14 cm  2D Strain GLS Avg:     -13.1 %   RIGHT VENTRICLE             IVC RV Basal diam:  3.10 cm     IVC diam: 1.50 cm RV S prime:     14.90 cm/s TAPSE (M-mode): 2.7 cm  LEFT ATRIUM             Index        RIGHT ATRIUM           Index LA diam:        4.60 cm 2.42 cm/m   RA Area:     15.10 cm LA Vol (A2C):   62.5 ml 32.83 ml/m  RA Volume:   40.00 ml  21.01 ml/m LA Vol (A4C):   72.6 ml 38.14 ml/m LA Biplane Vol: 74.4 ml 39.09 ml/m AORTIC VALVE AV Area (Vmax):    0.84 cm AV Area (Vmean):   0.86 cm AV Area (VTI):     0.82 cm AV Vmax:           331.00 cm/s AV Vmean:          224.200 cm/s AV VTI:            0.705 m AV Peak Grad:      43.8 mmHg AV Mean Grad:       24.3 mmHg LVOT Vmax:         88.55 cm/s LVOT Vmean:        61.300 cm/s LVOT VTI:          0.185 m LVOT/AV VTI ratio: 0.26 AI PHT:            351 msec  AORTA Ao Root diam: 3.00 cm Ao Asc diam:  3.00 cm  MV E velocity: 85.40 cm/s   TRICUSPID VALVE MV A velocity: 109.00 cm/s  TR Peak grad:   23.8 mmHg MV E/A ratio:  0.78         TR Vmax:        244.00 cm/s  SHUNTS Systemic VTI:  0.18 m Systemic Diam: 2.00 cm  Sreedhar reddy Madireddy Electronically signed by Alean reddy Madireddy Signature Date/Time: 06/16/2023/1:02:31 PM    Final    MONITORS  LONG TERM MONITOR (3-14 DAYS) 10/02/2020  Narrative Patch Wear Time:  6 days and 21 hours (2022-02-21T09:56:40-0500 to 2022-02-28T07:44:07-498)  Patient had a min HR of 21 bpm, max HR of 111 bpm, and avg HR of 59 bpm. Predominant underlying rhythm was Sinus Rhythm. First Degree AV Block was present. 17 episode(s) of AV Block (3rd) occurred, lasting a total of 2 mins 15 secs. Second Degree AV Block-Mobitz I (Wenckebach) was present. Wenckebach was detected within +/- 45 seconds of symptomatic patient event(s). Isolated SVEs were rare (<1.0%), and no SVE Couplets or SVE Triplets were present. Isolated VEs were rare (<1.0%), VE Couplets were rare (<1.0%), and no VE Triplets were present. Difficulty discerning atrial activity at times making definitive diagnosis difficult to ascertain. MD notification criteria for Complete Heart Block and Symptomatic Bradycardia met - report posted prior to notification per account request (TY).  An event monitor  was performed for 7 days to evaluate bradycardia Cardiac rhythm was sinus with average, minimum and maximum heart rates of 59 21, and 111 bpm. There were frequent episodes of Orest Elders and at x2-1 AV block nocturnally with heart rates in the range of 30 bpm. Ventricular ectopy overall was rare with couplets and single PVCs. Supraventricular ectopy was rare with no episodes of atrial  fibrillation or flutter. The triggered and symptomatic events were predominantly sinus rhythm but frequently occurred during second-degree AV block.  Conclusion frequent episodes of Mobitz 1 second-degree AV block particularly nocturnally at x2-1 conduction effective rate 30 bpm.       ______________________________________________________________________________________________          Recent Labs: 07/04/2024: Hemoglobin 10.5; Platelet Count 228  Recent Lipid Panel    Component Value Date/Time   CHOL 102 02/10/2022 1607   TRIG 21 02/10/2022 1607   HDL 39 (L) 02/10/2022 1607   CHOLHDL 2.6 02/10/2022 1607   VLDL 4 02/10/2022 1607   LDLCALC 59 02/10/2022 1607    Physical Exam:    VS:  There were no vitals taken for this visit.    Wt Readings from Last 3 Encounters:  05/09/24 149 lb 14.4 oz (68 kg)  02/15/24 153 lb 9.6 oz (69.7 kg)  01/05/24 152 lb (68.9 kg)     GEN: *** Well nourished, well developed in no acute distress HEENT: Normal NECK: No JVD; No carotid bruits LYMPHATICS: No lymphadenopathy CARDIAC: ***RRR, no murmurs, rubs, gallops RESPIRATORY:  Clear to auscultation without rales, wheezing or rhonchi  ABDOMEN: Soft, non-tender, non-distended MUSCULOSKELETAL:  No edema; No deformity  SKIN: Warm and dry NEUROLOGIC:  Alert and oriented x 3 PSYCHIATRIC:  Normal affect    Signed, Troy Leiter, MD  07/14/2024 7:34 PM    [facility]     [1]  No outpatient medications have been marked as taking for the 07/15/24 encounter (Appointment) with [name] Troy PARAS, MD.

## 2024-07-15 ENCOUNTER — Ambulatory Visit: Attending: Cardiology | Admitting: Cardiology

## 2024-07-15 ENCOUNTER — Encounter: Payer: Self-pay | Admitting: Cardiology

## 2024-07-15 VITALS — BP 160/60 | HR 70 | Ht 70.0 in | Wt 151.4 lb

## 2024-07-15 DIAGNOSIS — I2510 Atherosclerotic heart disease of native coronary artery without angina pectoris: Secondary | ICD-10-CM | POA: Diagnosis not present

## 2024-07-15 DIAGNOSIS — I441 Atrioventricular block, second degree: Secondary | ICD-10-CM | POA: Diagnosis not present

## 2024-07-15 DIAGNOSIS — N1832 Chronic kidney disease, stage 3b: Secondary | ICD-10-CM | POA: Diagnosis not present

## 2024-07-15 DIAGNOSIS — R001 Bradycardia, unspecified: Secondary | ICD-10-CM | POA: Insufficient documentation

## 2024-07-15 DIAGNOSIS — D631 Anemia in chronic kidney disease: Secondary | ICD-10-CM | POA: Insufficient documentation

## 2024-07-15 DIAGNOSIS — E7800 Pure hypercholesterolemia, unspecified: Secondary | ICD-10-CM | POA: Diagnosis not present

## 2024-07-15 DIAGNOSIS — I110 Hypertensive heart disease with heart failure: Secondary | ICD-10-CM | POA: Diagnosis not present

## 2024-07-15 DIAGNOSIS — N184 Chronic kidney disease, stage 4 (severe): Secondary | ICD-10-CM | POA: Diagnosis not present

## 2024-07-15 NOTE — Patient Instructions (Signed)
 Medication Instructions:  Your physician recommends that you continue on your current medications as directed. Please refer to the Current Medication list given to you today.  *If you need a refill on your cardiac medications before your next appointment, please call your pharmacy*  Lab Work: None If you have labs (blood work) drawn today and your tests are completely normal, you will receive your results only by: MyChart Message (if you have MyChart) OR A paper copy in the mail If you have any lab test that is abnormal or we need to change your treatment, we will call you to review the results.  Testing/Procedures: None  Follow-Up: At [facility], you and your health needs are our priority.  As part of our continuing mission to provide you with exceptional heart care, our providers are all part of one team.  This team includes your primary Cardiologist (physician) and Advanced Practice Providers or APPs (Physician Assistants and Nurse Practitioners) who all work together to provide you with the care you need, when you need it.  Your next appointment:   6 week(s)  Provider:   Redell Leiter, MD    We recommend signing up for the patient portal called MyChart.  Sign up information is provided on this After Visit Summary.  MyChart is used to connect with patients for Virtual Visits (Telemedicine).  Patients are able to view lab/test results, encounter notes, upcoming appointments, etc.  Non-urgent messages can be sent to your provider as well.   To learn more about what you can do with MyChart, go to [email or web address].   Other Instructions None

## 2024-07-20 ENCOUNTER — Other Ambulatory Visit: Payer: Self-pay | Admitting: Hematology and Oncology

## 2024-07-20 DIAGNOSIS — N183 Chronic kidney disease, stage 3 unspecified: Secondary | ICD-10-CM

## 2024-08-09 ENCOUNTER — Inpatient Hospital Stay: Attending: Hematology and Oncology

## 2024-08-09 ENCOUNTER — Other Ambulatory Visit: Payer: Self-pay

## 2024-08-09 ENCOUNTER — Inpatient Hospital Stay

## 2024-08-09 ENCOUNTER — Inpatient Hospital Stay: Admitting: Hematology and Oncology

## 2024-08-09 ENCOUNTER — Telehealth: Payer: Self-pay | Admitting: Hematology and Oncology

## 2024-08-09 VITALS — BP 152/57 | HR 65 | Temp 98.5°F | Resp 18 | Ht 70.0 in | Wt 150.3 lb

## 2024-08-09 DIAGNOSIS — R0602 Shortness of breath: Secondary | ICD-10-CM | POA: Insufficient documentation

## 2024-08-09 DIAGNOSIS — R0789 Other chest pain: Secondary | ICD-10-CM | POA: Insufficient documentation

## 2024-08-09 DIAGNOSIS — Z8546 Personal history of malignant neoplasm of prostate: Secondary | ICD-10-CM | POA: Insufficient documentation

## 2024-08-09 DIAGNOSIS — R7401 Elevation of levels of liver transaminase levels: Secondary | ICD-10-CM | POA: Insufficient documentation

## 2024-08-09 DIAGNOSIS — I252 Old myocardial infarction: Secondary | ICD-10-CM | POA: Insufficient documentation

## 2024-08-09 DIAGNOSIS — D631 Anemia in chronic kidney disease: Secondary | ICD-10-CM

## 2024-08-09 DIAGNOSIS — N184 Chronic kidney disease, stage 4 (severe): Secondary | ICD-10-CM | POA: Insufficient documentation

## 2024-08-09 DIAGNOSIS — R748 Abnormal levels of other serum enzymes: Secondary | ICD-10-CM

## 2024-08-09 LAB — CBC WITH DIFFERENTIAL (CANCER CENTER ONLY)
Abs Immature Granulocytes: 0.02 K/uL (ref 0.00–0.07)
Basophils Absolute: 0.1 K/uL (ref 0.0–0.1)
Basophils Relative: 1 %
Eosinophils Absolute: 0.2 K/uL (ref 0.0–0.5)
Eosinophils Relative: 3 %
HCT: 31.2 % — ABNORMAL LOW (ref 39.0–52.0)
Hemoglobin: 9.8 g/dL — ABNORMAL LOW (ref 13.0–17.0)
Immature Granulocytes: 0 %
Lymphocytes Relative: 14 %
Lymphs Abs: 1 K/uL (ref 0.7–4.0)
MCH: 28.8 pg (ref 26.0–34.0)
MCHC: 31.4 g/dL (ref 30.0–36.0)
MCV: 91.8 fL (ref 80.0–100.0)
Monocytes Absolute: 0.7 K/uL (ref 0.1–1.0)
Monocytes Relative: 9 %
Neutro Abs: 5.2 K/uL (ref 1.7–7.7)
Neutrophils Relative %: 73 %
Platelet Count: 224 K/uL (ref 150–400)
RBC: 3.4 MIL/uL — ABNORMAL LOW (ref 4.22–5.81)
RDW: 14.6 % (ref 11.5–15.5)
WBC Count: 7.1 K/uL (ref 4.0–10.5)
nRBC: 0 % (ref 0.0–0.2)

## 2024-08-09 LAB — CMP (CANCER CENTER ONLY)
ALT: 51 U/L — ABNORMAL HIGH (ref 0–44)
AST: 61 U/L — ABNORMAL HIGH (ref 15–41)
Albumin: 3.9 g/dL (ref 3.5–5.0)
Alkaline Phosphatase: 303 U/L — ABNORMAL HIGH (ref 38–126)
Anion gap: 11 (ref 5–15)
BUN: 56 mg/dL — ABNORMAL HIGH (ref 8–23)
CO2: 22 mmol/L (ref 22–32)
Calcium: 9.9 mg/dL (ref 8.9–10.3)
Chloride: 104 mmol/L (ref 98–111)
Creatinine: 2.85 mg/dL — ABNORMAL HIGH (ref 0.61–1.24)
GFR, Estimated: 21 mL/min — ABNORMAL LOW
Glucose, Bld: 188 mg/dL — ABNORMAL HIGH (ref 70–99)
Potassium: 4.3 mmol/L (ref 3.5–5.1)
Sodium: 137 mmol/L (ref 135–145)
Total Bilirubin: 0.4 mg/dL (ref 0.0–1.2)
Total Protein: 7 g/dL (ref 6.5–8.1)

## 2024-08-09 LAB — PSA: Prostatic Specific Antigen: <0.02 ng/mL (ref 0.00–4.00)

## 2024-08-09 LAB — VITAMIN B12: Vitamin B-12: 1384 pg/mL — ABNORMAL HIGH (ref 180–914)

## 2024-08-09 LAB — IRON AND TIBC
Iron: 58 ug/dL (ref 45–182)
Saturation Ratios: 20 % (ref 17.9–39.5)
TIBC: 293 ug/dL (ref 250–450)
UIBC: 235 ug/dL

## 2024-08-09 LAB — FERRITIN: Ferritin: 144 ng/mL (ref 24–336)

## 2024-08-09 LAB — FOLATE: Folate: >20 ng/mL

## 2024-08-09 MED ORDER — DARBEPOETIN ALFA 100 MCG/0.5ML IJ SOSY
100.0000 ug | PREFILLED_SYRINGE | Freq: Once | INTRAMUSCULAR | Status: AC
  Administered 2024-08-09: 100 ug via SUBCUTANEOUS
  Filled 2024-08-09: qty 0.5

## 2024-08-09 NOTE — Progress Notes (Unsigned)
 " [facility]  [address] [place],  [place]  [address] [phone]  Clinic Day:  08/09/2024  Referring physician: Rusty, [name], MD   HISTORY OF PRESENT ILLNESS:  The patient is a 89 y.o. male with anemia of chronic kidney disease. He received epoetin  alpha injections every 4 weeks. Each time he has had an increase in his hemoglobin. He was then admitted in April 2024 with a NSTEMI. Cardiac catheterization revealed in-stent restenosis treated with balloon angioplasty. He was referred back in December 2024 by Dr. Monetta, when his hemoglobin dropped to 9.5. We resumed epoetin  alpha in December and he had a good response.  He continues epoetin  every 4 weeks as needed to keep his hemoglobin at or above 10 due to his cardiac disease.  He is here today for repeat clinical assessment and states he has been doing well.  He denies progressive fatigue concerning for worsening anemia.  He has a history of prostate cancer and does not follow with urology.    PHYSICAL EXAM:  Blood pressure (!) 152/57, pulse 65, temperature 98.5 F (36.9 C), temperature source Oral, resp. rate 18, height 5' 10 (1.778 m), weight 150 lb 4.8 oz (68.2 kg), SpO2 100%. Wt Readings from Last 3 Encounters:  08/09/24 150 lb 4.8 oz (68.2 kg)  07/15/24 151 lb 6.4 oz (68.7 kg)  05/09/24 149 lb 14.4 oz (68 kg)   Body mass index is 21.57 kg/m.  Performance status (ECOG): 1 - Symptomatic but completely ambulatory  Physical Exam  LABS:      Latest Ref Rng & Units 08/09/2024   10:35 AM 07/04/2024   10:23 AM 06/06/2024   10:10 AM  CBC  WBC 4.0 - 10.5 K/uL 7.1  8.5  8.7   Hemoglobin 13.0 - 17.0 g/dL 9.8  89.4  89.9   Hematocrit 39.0 - 52.0 % 31.2  33.0  31.1   Platelets 150 - 400 K/uL 224  228  263       Latest Ref Rng & Units 07/15/2023   10:10 AM 05/22/2023    3:52 PM 12/16/2022    9:26 AM  CMP  Glucose 70 - 99 mg/dL 815  862  852   BUN 8 - 23 mg/dL 48  51  46   Creatinine 0.61 -  1.24 mg/dL 7.47  7.15  7.20   Sodium 135 - 145 mmol/L 136  138  138   Potassium 3.5 - 5.1 mmol/L 4.6  5.1  4.9   Chloride 98 - 111 mmol/L 104  103  102   CO2 22 - 32 mmol/L 18  21  22    Calcium  8.9 - 10.3 mg/dL 9.6  9.4  9.7   Total Protein 6.5 - 8.1 g/dL 7.1     Total Bilirubin <1.2 mg/dL 0.4     Alkaline Phos 38 - 126 U/L 175     AST 15 - 41 U/L 38     ALT 0 - 44 U/L 24        Lab Results  Component Value Date   TOTALPROTELP 6.5 03/27/2022   ALBUMINELP 3.4 03/27/2022   A1GS 0.2 03/27/2022   A2GS 0.6 03/27/2022   BETS 0.8 03/27/2022   GAMS 1.4 03/27/2022   MSPIKE Not Observed 03/27/2022   SPEI Comment 03/27/2022   Lab Results  Component Value Date   TIBC 298 02/15/2024   TIBC 337 11/09/2023   TIBC 287 07/15/2023   FERRITIN 170 02/15/2024   FERRITIN 142  11/09/2023   FERRITIN 142 07/15/2023   IRONPCTSAT 17 (L) 02/15/2024   IRONPCTSAT 24 11/09/2023   IRONPCTSAT 30 07/15/2023    Latest Reference Range & Units 02/15/24 10:33  Iron 45 - 182 ug/dL 52  UIBC ug/dL 753  TIBC 749 - 549 ug/dL 701  Saturation Ratios 17.9 - 39.5 % 17 (L)  (L): Data is abnormally low  Lab Results  Component Value Date   LDH 201 (H) 03/27/2022       Component Value Date/Time   TOTALPROTELP 6.5 03/27/2022 1453   ALBUMINELP 3.4 03/27/2022 1453   A1GS 0.2 03/27/2022 1453   A2GS 0.6 03/27/2022 1453   BETS 0.8 03/27/2022 1453   GAMS 1.4 03/27/2022 1453   MSPIKE Not Observed 03/27/2022 1453   SPEI Comment 03/27/2022 1453   LDH 201 (H) 03/27/2022 1453    Review Flowsheet  More data exists      Latest Ref Rng & Units 07/15/2023 11/09/2023 02/15/2024  Oncology Labs  Ferritin 24 - 336 ng/mL 142  142  170   %SAT 17.9 - 39.5 % 30  24  17       STUDIES:  No results found.    ASSESSMENT & PLAN:   Assessment/Plan:  89 y.o. male with anemia due to chronic kidney disease, for which he receives epoetin  alpha every 4 weeks as needed for hemoglobin below 10.  His hemoglobin is 9.8 today, so he  will receive epoetin  alpha today.  He has mild elevation of the transaminases, but also his alkaline phosphatase.  He had elevation of the liver function tests in 2023 for unknown reason.  However, I will check a PSA today, since he has not had this done recently.  His ksCBC will continue to be checked every 4 weeks and epoetin  alpha given to keep his hemoglobin at or over 10.  I will plan to see him back in 3 months for repeat clinical assessment. The patient understands all the plans discussed today and is in agreement with them.  He knows to contact our office if he develops concerns prior to his next appointment.     Andrez DELENA Foy, PA-C   Physician Assistant [facility] [phone]    "

## 2024-08-09 NOTE — Patient Instructions (Signed)

## 2024-08-09 NOTE — Telephone Encounter (Signed)
 Patient has been scheduled for follow-up visit per 08/09/2024 LOS.  Pt given an appt calendar with date and time.

## 2024-08-10 ENCOUNTER — Encounter: Payer: Self-pay | Admitting: Hematology and Oncology

## 2024-08-12 ENCOUNTER — Telehealth: Payer: Self-pay

## 2024-08-12 NOTE — Telephone Encounter (Signed)
-----   Message from Andrez DELENA Foy sent at 08/10/2024  9:21 AM EST ----- Please let him know that his PSA was undetectable and vitamin levels were normal.  Thank you

## 2024-08-12 NOTE — Telephone Encounter (Signed)
 Message sent through MyChart.

## 2024-08-18 ENCOUNTER — Ambulatory Visit: Admitting: Podiatry

## 2024-08-18 DIAGNOSIS — M79675 Pain in left toe(s): Secondary | ICD-10-CM | POA: Diagnosis not present

## 2024-08-18 DIAGNOSIS — M79674 Pain in right toe(s): Secondary | ICD-10-CM

## 2024-08-18 DIAGNOSIS — B351 Tinea unguium: Secondary | ICD-10-CM | POA: Diagnosis not present

## 2024-08-18 NOTE — Progress Notes (Signed)
 "     Subjective:  Patient ID: Troy [name], male    DOB: 04/28/1936,  MRN: [id number]  Troy [name] presents to clinic today for:  Chief Complaint  Patient presents with   [facility]    [facility] callus, but does not bother him, nails only A1c unknown (pcp is [place])  Plavix    Patient notes nails are thick, discolored, elongated and painful in shoegear when trying to ambulate.    PCP is Street, [name], MD.  Past Medical History:  Diagnosis Date   Anemia 12/09/2015   Anemia due to stage 3 chronic kidney disease (HCC) 04/30/2022   Aortic stenosis, mild 01/15/2021   Atherosclerosis    Benign hypertension with chronic kidney disease, stage III (HCC) 01/15/2021   Benign hypertension with chronic kidney disease, stage IV (HCC) 01/15/2021   Bradycardia 10/01/2020   CAD in native artery 12/09/2015   Overview:  PCI/ stent 2001   Chronic diastolic heart failure (HCC) 12/09/2015   Chronic pancreatitis (HCC)    CKD (chronic kidney disease)    3B   CKD (chronic kidney disease) stage 4, GFR 15-29 ml/min (HCC) 01/15/2021   Coronary artery disease involving native coronary artery of native heart with angina pectoris 12/09/2015   Overview:  PCI/ stent 2001   Coronary artery disease involving native coronary artery of native heart with unstable angina pectoris (HCC) 12/09/2015   Overview:   PCI/ stent 2001     Diabetes (HCC)    GERD (gastroesophageal reflux disease)    H/O prostate cancer 01/15/2021   Hearing loss    HFrEF (heart failure with reduced ejection fraction) (HCC) => combination hypertensive heart disease and CAD related 12/09/2015   Hyperlipidemia 12/09/2015   Hyperlipidemia associated with type 2 diabetes mellitus (HCC) 12/09/2015   Hypertensive heart disease with heart failure (HCC) 12/09/2015   Lung nodule 11/05/2020   LVH (left ventricular hypertrophy) 02/10/2022   Macular degeneration    legally blind   MI (myocardial infarction) (HCC)    Necrotizing pancreatitis  06/26/2015   NSTEMI (non-ST elevated myocardial infarction) (HCC) 02/10/2022   Pain due to onychomycosis of toenails of both feet 01/07/2023   Prostate cancer (HCC)    radiation treatments here   Pseudocyst of pancreas 06/26/2015   PVD (peripheral vascular disease)    Second degree heart block    Type 2 diabetes mellitus with stage 4 chronic kidney disease, with long-term current use of insulin  (HCC) 01/15/2021   Past Surgical History:  Procedure Laterality Date   ABDOMINAL AORTOGRAM W/LOWER EXTREMITY Bilateral 09/10/2018   Procedure: ABDOMINAL AORTOGRAM W/LOWER EXTREMITY;  Surgeon: Harvey Carlin BRAVO, MD;  Location: [facility] INVASIVE CV LAB;  Service: Cardiovascular;  Laterality: Bilateral;   cardiac baloon surgery     11/12/2022   CHOLECYSTECTOMY     CORONARY STENT INTERVENTION N/A 02/14/2022   Procedure: CORONARY STENT INTERVENTION;  Surgeon: Mady Lonni, MD;  Location: [facility] INVASIVE CV [facility];  Service: Cardiovascular;  Laterality: N/A;   CORONARY ULTRASOUND/IVUS N/A 02/14/2022   Procedure: Intravascular Ultrasound/IVUS;  Surgeon: Mady Lonni, MD;  Location: [facility] INVASIVE CV [facility];  Service: Cardiovascular;  Laterality: N/A;   LEFT HEART CATH N/A 02/14/2022   Procedure: Left Heart Cath;  Surgeon: Mady Lonni, MD;  Location: [facility]VAS[facility];  Service: Cardiovascular;  Laterality: N/A;   LEFT HEART CATH AND CORONARY ANGIOGRAPHY N/A 02/12/2022   Procedure: LEFT HEART CATH AND CORONARY ANGIOGRAPHY;  Surgeon: Darron Deatrice LABOR, MD;  Location: [facility]VAS[facility] CV [facility];  Service:  Cardiovascular;  Laterality: N/A;   PERIPHERAL VASCULAR INTERVENTION Right 09/10/2018   Procedure: PERIPHERAL VASCULAR INTERVENTION;  Surgeon: Harvey Carlin BRAVO, MD;  Location: [facility] INVASIVE CV LAB;  Service: Cardiovascular;  Laterality: Right;  common iliac   TONSILLECTOMY AND ADENOIDECTOMY     TOTAL KNEE REVISION Bilateral    Allergies[1]  Review of Systems: Negative except as noted in the HPI.  Objective:  Troy [name] is a pleasant 89 y.o. male in NAD. AAO x 3.  Vascular Examination: Capillary refill time is 3-5 seconds to toes bilateral. Palpable pedal pulses b/l LE. Digital hair present b/l.  Skin temperature gradient WNL b/l. No varicosities b/l. No cyanosis noted b/l.   Dermatological Examination: Pedal skin with normal turgor, texture and tone b/l. No open wounds. No interdigital macerations b/l. Toenails x10 are 3mm thick, discolored, dystrophic with subungual debris. There is pain with compression of the nail plates.  They are elongated x10  Assessment/Plan: 1. Pain due to onychomycosis of toenails of both feet    The mycotic toenails were sharply debrided x10 with sterile nail nippers and a power debriding burr to decrease bulk/thickness and length.    Return in about 3 months (around 11/16/2024) for [facility].   Awanda CHARM Imperial, DPM, FACFAS [facility]     [address] [place], [place] [address]                Office [phone]  Fax [phone]    [1]  Allergies Allergen Reactions   Isordil  [Isosorbide  Nitrate]    "

## 2024-08-26 ENCOUNTER — Ambulatory Visit: Admitting: Cardiology

## 2024-09-05 ENCOUNTER — Inpatient Hospital Stay: Attending: Hematology and Oncology

## 2024-09-05 ENCOUNTER — Inpatient Hospital Stay

## 2024-10-03 ENCOUNTER — Inpatient Hospital Stay

## 2024-10-10 ENCOUNTER — Ambulatory Visit: Admitting: Cardiology

## 2024-11-01 ENCOUNTER — Inpatient Hospital Stay

## 2024-11-01 ENCOUNTER — Inpatient Hospital Stay: Admitting: Hematology and Oncology

## 2024-11-17 ENCOUNTER — Ambulatory Visit: Admitting: Podiatry
